# Patient Record
Sex: Female | Born: 1940 | Race: White | Hispanic: No | Smoking: Current every day smoker
Health system: Southern US, Community
[De-identification: ages and names within clinical notes are randomized; demographics above are authoritative.]

## PROBLEM LIST (undated history)

## (undated) DIAGNOSIS — R87619 Unspecified abnormal cytological findings in specimens from cervix uteri: Secondary | ICD-10-CM

## (undated) DIAGNOSIS — E785 Hyperlipidemia, unspecified: Secondary | ICD-10-CM

## (undated) DIAGNOSIS — Z1239 Encounter for other screening for malignant neoplasm of breast: Secondary | ICD-10-CM

## (undated) DIAGNOSIS — M7150 Other bursitis, not elsewhere classified, unspecified site: Secondary | ICD-10-CM

## (undated) DIAGNOSIS — E079 Disorder of thyroid, unspecified: Secondary | ICD-10-CM

## (undated) DIAGNOSIS — M8580 Other specified disorders of bone density and structure, unspecified site: Secondary | ICD-10-CM

## (undated) DIAGNOSIS — C801 Malignant (primary) neoplasm, unspecified: Secondary | ICD-10-CM

## (undated) DIAGNOSIS — Z720 Tobacco use: Secondary | ICD-10-CM

## (undated) DIAGNOSIS — J449 Chronic obstructive pulmonary disease, unspecified: Secondary | ICD-10-CM

## (undated) DIAGNOSIS — Z8679 Personal history of other diseases of the circulatory system: Secondary | ICD-10-CM

## (undated) HISTORY — PX: CHOLECYSTECTOMY: SHX55

## (undated) HISTORY — DX: Disorder of thyroid, unspecified: E07.9

## (undated) HISTORY — DX: Hyperlipidemia, unspecified: E78.5

## (undated) HISTORY — DX: Other specified disorders of bone density and structure, unspecified site: M85.80

## (undated) HISTORY — DX: Other bursitis, not elsewhere classified, unspecified site: M71.50

## (undated) HISTORY — DX: Encounter for other screening for malignant neoplasm of breast: Z12.39

## (undated) HISTORY — DX: Tobacco use: Z72.0

## (undated) HISTORY — DX: Personal history of other diseases of the circulatory system: Z86.79

## (undated) HISTORY — PX: BREAST BIOPSY: SHX20

## (undated) HISTORY — PX: TUBAL LIGATION: SHX77

## (undated) HISTORY — DX: Chronic obstructive pulmonary disease, unspecified: J44.9

## (undated) HISTORY — DX: Unspecified abnormal cytological findings in specimens from cervix uteri: R87.619

---

## 1999-10-14 DIAGNOSIS — Z8679 Personal history of other diseases of the circulatory system: Secondary | ICD-10-CM

## 1999-10-14 DIAGNOSIS — R87619 Unspecified abnormal cytological findings in specimens from cervix uteri: Secondary | ICD-10-CM

## 1999-10-14 HISTORY — DX: Personal history of other diseases of the circulatory system: Z86.79

## 1999-10-14 HISTORY — DX: Unspecified abnormal cytological findings in specimens from cervix uteri: R87.619

## 2004-10-10 ENCOUNTER — Ambulatory Visit: Payer: Self-pay | Admitting: Gastroenterology

## 2005-07-31 ENCOUNTER — Ambulatory Visit: Payer: Self-pay | Admitting: Family Medicine

## 2005-08-12 ENCOUNTER — Ambulatory Visit: Payer: Self-pay | Admitting: Family Medicine

## 2005-09-01 ENCOUNTER — Ambulatory Visit: Payer: Self-pay | Admitting: Surgery

## 2006-10-14 ENCOUNTER — Ambulatory Visit: Payer: Self-pay | Admitting: Family Medicine

## 2007-10-12 ENCOUNTER — Ambulatory Visit: Payer: Self-pay | Admitting: Gastroenterology

## 2007-10-14 DIAGNOSIS — M8580 Other specified disorders of bone density and structure, unspecified site: Secondary | ICD-10-CM

## 2007-10-14 HISTORY — DX: Other specified disorders of bone density and structure, unspecified site: M85.80

## 2007-11-09 ENCOUNTER — Ambulatory Visit: Payer: Self-pay | Admitting: Internal Medicine

## 2007-11-15 ENCOUNTER — Ambulatory Visit: Payer: Self-pay | Admitting: Internal Medicine

## 2008-01-10 ENCOUNTER — Ambulatory Visit: Payer: Self-pay | Admitting: Internal Medicine

## 2008-01-13 ENCOUNTER — Ambulatory Visit: Payer: Self-pay | Admitting: Internal Medicine

## 2008-02-29 ENCOUNTER — Ambulatory Visit: Payer: Self-pay | Admitting: Internal Medicine

## 2008-02-29 ENCOUNTER — Other Ambulatory Visit: Payer: Self-pay

## 2008-12-06 ENCOUNTER — Ambulatory Visit: Payer: Self-pay | Admitting: Internal Medicine

## 2009-04-02 ENCOUNTER — Ambulatory Visit: Payer: Self-pay | Admitting: Surgery

## 2009-04-09 ENCOUNTER — Ambulatory Visit: Payer: Self-pay | Admitting: Surgery

## 2009-12-11 ENCOUNTER — Ambulatory Visit: Payer: Self-pay | Admitting: Internal Medicine

## 2010-03-13 ENCOUNTER — Ambulatory Visit: Payer: Self-pay | Admitting: Surgery

## 2010-03-13 HISTORY — PX: HERNIA REPAIR: SHX51

## 2010-03-21 ENCOUNTER — Ambulatory Visit: Payer: Self-pay | Admitting: Surgery

## 2010-05-21 LAB — HM COLONOSCOPY

## 2010-06-13 HISTORY — PX: CATARACT EXTRACTION: SUR2

## 2010-06-21 ENCOUNTER — Ambulatory Visit: Payer: Self-pay | Admitting: Ophthalmology

## 2010-07-02 ENCOUNTER — Ambulatory Visit: Payer: Self-pay | Admitting: Ophthalmology

## 2011-02-11 DIAGNOSIS — Z1239 Encounter for other screening for malignant neoplasm of breast: Secondary | ICD-10-CM | POA: Insufficient documentation

## 2011-02-11 HISTORY — DX: Encounter for other screening for malignant neoplasm of breast: Z12.39

## 2011-02-19 ENCOUNTER — Ambulatory Visit: Payer: Self-pay | Admitting: Internal Medicine

## 2011-07-04 ENCOUNTER — Ambulatory Visit (INDEPENDENT_AMBULATORY_CARE_PROVIDER_SITE_OTHER): Payer: BC Managed Care – PPO | Admitting: Internal Medicine

## 2011-07-04 ENCOUNTER — Encounter: Payer: Self-pay | Admitting: Internal Medicine

## 2011-07-04 VITALS — BP 170/91 | HR 69 | Temp 98.1°F | Resp 14 | Ht 65.0 in | Wt 179.5 lb

## 2011-07-04 DIAGNOSIS — I83812 Varicose veins of left lower extremities with pain: Secondary | ICD-10-CM

## 2011-07-04 DIAGNOSIS — IMO0001 Reserved for inherently not codable concepts without codable children: Secondary | ICD-10-CM

## 2011-07-04 DIAGNOSIS — J449 Chronic obstructive pulmonary disease, unspecified: Secondary | ICD-10-CM

## 2011-07-04 DIAGNOSIS — E785 Hyperlipidemia, unspecified: Secondary | ICD-10-CM

## 2011-07-04 DIAGNOSIS — I499 Cardiac arrhythmia, unspecified: Secondary | ICD-10-CM

## 2011-07-04 DIAGNOSIS — E039 Hypothyroidism, unspecified: Secondary | ICD-10-CM

## 2011-07-04 DIAGNOSIS — R300 Dysuria: Secondary | ICD-10-CM

## 2011-07-04 DIAGNOSIS — I83893 Varicose veins of bilateral lower extremities with other complications: Secondary | ICD-10-CM

## 2011-07-04 DIAGNOSIS — I10 Essential (primary) hypertension: Secondary | ICD-10-CM

## 2011-07-04 DIAGNOSIS — I8390 Asymptomatic varicose veins of unspecified lower extremity: Secondary | ICD-10-CM

## 2011-07-04 DIAGNOSIS — M791 Myalgia, unspecified site: Secondary | ICD-10-CM

## 2011-07-04 LAB — BASIC METABOLIC PANEL
CO2: 27 mEq/L (ref 19–32)
Calcium: 9 mg/dL (ref 8.4–10.5)
Chloride: 102 mEq/L (ref 96–112)
Glucose, Bld: 96 mg/dL (ref 70–99)
Sodium: 137 mEq/L (ref 135–145)

## 2011-07-04 LAB — HEPATIC FUNCTION PANEL
ALT: 17 U/L (ref 0–35)
AST: 20 U/L (ref 0–37)
Albumin: 4.1 g/dL (ref 3.5–5.2)
Alkaline Phosphatase: 68 U/L (ref 39–117)
Total Protein: 7.2 g/dL (ref 6.0–8.3)

## 2011-07-04 LAB — MAGNESIUM: Magnesium: 1.9 mg/dL (ref 1.5–2.5)

## 2011-07-04 LAB — POCT URINALYSIS DIPSTICK
Bilirubin, UA: NEGATIVE
Glucose, UA: NEGATIVE
Ketones, UA: NEGATIVE
Nitrite, UA: NEGATIVE
pH, UA: 6.5

## 2011-07-04 LAB — LIPID PANEL
Total CHOL/HDL Ratio: 3
Triglycerides: 103 mg/dL (ref 0.0–149.0)

## 2011-07-04 MED ORDER — SIMVASTATIN 20 MG PO TABS: 20.0000 mg | ORAL_TABLET | Freq: Every day | ORAL | Status: DC

## 2011-07-04 MED ORDER — LEVOTHYROXINE SODIUM 100 MCG PO TABS: 100.0000 ug | ORAL_TABLET | Freq: Every day | ORAL | Status: DC

## 2011-07-04 MED ORDER — FLUTICASONE-SALMETEROL 250-50 MCG/DOSE IN AEPB: 1.0000 | INHALATION_SPRAY | Freq: Two times a day (BID) | RESPIRATORY_TRACT | Status: DC

## 2011-07-04 NOTE — Patient Instructions (Addendum)
I recommend trying Fibercon or Metamucil capsules 30 minutes before dinner daily to 1) help with constipation and  2) curb appetite.   Please check your blood pressure 5 times over the next 2 weeks and report the readings to me .  If they are mostly > 130/80. I will recommend starting a medication for blood pressure  Return for fasting blood work  In the next week or so.   We will check your urine today for infection

## 2011-07-04 NOTE — Progress Notes (Signed)
  Subjective:    Patient ID: Jane Davidson, female    DOB: [birth year], 70 y.o.   MRN: [id number]  HPI 70 yo with hypothyroid disease, hypertension and COPD presents for 6 months f/u.  Feels tired all the time,  Not sleeping particularly well.  Falling asleep during the morning and afternoon, despite being in bed for a minimum of 8 hours,  Not exercising daily. Weight stable. Constipated without a daily stool softener.     Review of Systems  Constitutional: Positive for fatigue. Negative for fever, chills and unexpected weight change.  HENT: Negative for hearing loss, ear pain, nosebleeds, congestion, sore throat, facial swelling, rhinorrhea, sneezing, mouth sores, trouble swallowing, neck pain, neck stiffness, voice change, postnasal drip, sinus pressure, tinnitus and ear discharge.   Eyes: Negative for pain, discharge, redness and visual disturbance.  Respiratory: Negative for cough, chest tightness, shortness of breath, wheezing and stridor.   Cardiovascular: Positive for palpitations. Negative for chest pain and leg swelling.  Musculoskeletal: Negative for myalgias and arthralgias.  Skin: Negative for color change and rash.  Neurological: Negative for dizziness, tremors, syncope, weakness, light-headedness and headaches.  Hematological: Negative for adenopathy.  Psychiatric/Behavioral: Positive for sleep disturbance.       Objective:   Physical Exam  Constitutional: She is oriented to person, place, and time. She appears well-developed and well-nourished.  HENT:  Mouth/Throat: Oropharynx is clear and moist.  Eyes: EOM are normal. Pupils are equal, round, and reactive to light. No scleral icterus.  Neck: Normal range of motion. Neck supple. No JVD present. No thyromegaly present.  Cardiovascular: Normal rate, normal heart sounds and intact distal pulses.        Irregular heart beat noted  Pulmonary/Chest: Effort normal and breath sounds normal.  Abdominal: Soft. Bowel sounds are  normal. She exhibits no mass. There is no tenderness.  Musculoskeletal: Normal range of motion. She exhibits no edema.  Lymphadenopathy:    She has no cervical adenopathy.  Neurological: She is alert and oriented to person, place, and time. She displays normal reflexes. No cranial nerve deficit. She exhibits normal muscle tone. Coordination normal.  Skin: Skin is warm and dry.  Psychiatric: She has a normal mood and affect.          Assessment & Plan:

## 2011-07-05 ENCOUNTER — Encounter: Payer: Self-pay | Admitting: Internal Medicine

## 2011-07-05 DIAGNOSIS — I499 Cardiac arrhythmia, unspecified: Secondary | ICD-10-CM | POA: Insufficient documentation

## 2011-07-05 DIAGNOSIS — E039 Hypothyroidism, unspecified: Secondary | ICD-10-CM | POA: Insufficient documentation

## 2011-07-05 DIAGNOSIS — E785 Hyperlipidemia, unspecified: Secondary | ICD-10-CM | POA: Insufficient documentation

## 2011-07-05 DIAGNOSIS — I83812 Varicose veins of left lower extremities with pain: Secondary | ICD-10-CM | POA: Insufficient documentation

## 2011-07-05 DIAGNOSIS — J449 Chronic obstructive pulmonary disease, unspecified: Secondary | ICD-10-CM | POA: Insufficient documentation

## 2011-07-05 MED ORDER — LEVOTHYROXINE SODIUM 88 MCG PO TABS: 88.0000 ug | ORAL_TABLET | Freq: Every day | ORAL | Status: DC

## 2011-07-05 NOTE — Assessment & Plan Note (Signed)
New onset,  EKG today was normal except for mild bradycardia.  Will check electrolytes and thyroid function and if all normal  No fuether workup unless she becomes symptomatic.

## 2011-07-05 NOTE — Assessment & Plan Note (Signed)
Well controlled on current statin dose,  No changes today

## 2011-07-05 NOTE — Assessment & Plan Note (Signed)
Currently overactive on current thyroid dose, which may be contributing current irregular heart beat .  Will reasses after adjusting synthroid dose.,

## 2011-07-05 NOTE — Assessment & Plan Note (Signed)
Secondary to tobacco abuse.  May be contributing to her fatigue.  She has no PFTS on records.  Will refer to [facility] for testing.

## 2011-07-05 NOTE — Assessment & Plan Note (Signed)
With prior intervention by local vascular surgeon,  Records not available.  She is having pain without inflammation in the left inner thigh .  Refer to Vascular Surgery for evaluation .

## 2011-07-06 LAB — URINE CULTURE: Colony Count: 8000

## 2011-07-07 ENCOUNTER — Other Ambulatory Visit: Payer: Self-pay | Admitting: Internal Medicine

## 2011-07-07 MED ORDER — FLUTICASONE-SALMETEROL 100-50 MCG/DOSE IN AEPB: 1.0000 | INHALATION_SPRAY | Freq: Two times a day (BID) | RESPIRATORY_TRACT | Status: DC

## 2011-07-21 ENCOUNTER — Telehealth: Payer: Self-pay | Admitting: Internal Medicine

## 2011-07-21 NOTE — Telephone Encounter (Signed)
error 

## 2011-07-31 ENCOUNTER — Telehealth: Payer: Self-pay | Admitting: Internal Medicine

## 2011-07-31 NOTE — Telephone Encounter (Signed)
Patient has developed a runny nose & a deep cough since receiving the flu shot, she is requesting an antibiotic to be called into [facility]

## 2011-07-31 NOTE — Telephone Encounter (Signed)
In the absence of fevers and productive cough, there is no indication for antibiotics.  If she has had symptoms for more than one week, needs to be seen.

## 2011-07-31 NOTE — Telephone Encounter (Signed)
Patient made an appt for 08/06/11, she states the cough started when she got her flu shot, she does not have a fever

## 2011-08-06 ENCOUNTER — Encounter: Payer: Self-pay | Admitting: Internal Medicine

## 2011-08-06 ENCOUNTER — Ambulatory Visit (INDEPENDENT_AMBULATORY_CARE_PROVIDER_SITE_OTHER): Payer: BC Managed Care – PPO | Admitting: Internal Medicine

## 2011-08-06 VITALS — BP 156/86 | HR 98 | Temp 98.3°F | Resp 16 | Ht 65.0 in | Wt 181.5 lb

## 2011-08-06 DIAGNOSIS — Z7189 Other specified counseling: Secondary | ICD-10-CM

## 2011-08-06 DIAGNOSIS — K5900 Constipation, unspecified: Secondary | ICD-10-CM

## 2011-08-06 DIAGNOSIS — R300 Dysuria: Secondary | ICD-10-CM

## 2011-08-06 DIAGNOSIS — J40 Bronchitis, not specified as acute or chronic: Secondary | ICD-10-CM

## 2011-08-06 DIAGNOSIS — Z716 Tobacco abuse counseling: Secondary | ICD-10-CM

## 2011-08-06 LAB — POCT URINALYSIS DIPSTICK
Nitrite, UA: NEGATIVE
Protein, UA: NEGATIVE
Urobilinogen, UA: 0.2
pH, UA: 6

## 2011-08-06 MED ORDER — LEVOFLOXACIN 750 MG PO TABS: 750.0000 mg | ORAL_TABLET | Freq: Every day | ORAL | Status: AC

## 2011-08-06 MED ORDER — LACTULOSE 20 GM/30ML PO SOLN: 30.0000 mL | Freq: Four times a day (QID) | ORAL | Status: DC | PRN

## 2011-08-06 NOTE — Patient Instructions (Signed)
Return  in January for a full physical  Use the lactulsoe not more than once aweek to relieve constipation.  Work in about 4-6 hours  Drink one more glass of water daily as a rule

## 2011-08-06 NOTE — Progress Notes (Signed)
Subjective:    Patient ID: Jane Davidson, female    DOB: 1941-02-01, 70 y.o.   MRN: [id number]  HPIMs. Demarais is a 70 yo woman with a hsitory of COPD secondary to ongoing tobacco abusewho presents with a 2 weeks history of cough which began after she received her annual influenza vaccine.  The cough is persistent and productive of yellow sputum. ,  She has noted wheezing at home with supine position but not with activity and she denies chest pain, fevers, and shortness of breath. No recent travel or sick contacts.  Some sinus draingage and pharyngitis . Her second complaint is dysuria which began about 24 hours ago. She denies blood in her urine, back pain,  And nausea.  Past Medical History  Diagnosis Date  . Hyperlipidemia   . Bursitis NEC   . Osteopenia 2009     T scores - 1.5 DEXA 2009  . Tobacco abuse disorder   . COPD (chronic obstructive pulmonary disease)   . hypothyroid   . Vitamin D deficiency April 2011    replaced withDrsido for level of 11.3 ng/ml  . Vitamin D deficiency April 2011    replaced withDrsido for level of 11.3 ng/ml  . Screening for breast cancer May 2012    [facility] mammogram  . Abnormal Pap smear of cervix 2001    Biopsy normal  . History of peripheral vascular disease 2001    s/p bilateral aorto-iliac-femoral bypass /[facility]ke    Current Outpatient Prescriptions on File Prior to Visit  Medication Sig Dispense Refill  . Biotin 10 MG TABS Take by mouth.        . cyanocobalamin 100 MCG tablet Take 100 mcg by mouth daily.        . Fluticasone-Salmeterol (ADVAIR DISKUS) 100-50 MCG/DOSE AEPB Inhale 1 puff into the lungs 2 (two) times daily.  60 each  2  . levothyroxine (SYNTHROID, LEVOTHROID) 88 MCG tablet Take 1 tablet (88 mcg total) by mouth daily.  90 tablet  3  . sertraline (ZOLOFT) 50 MG tablet Take 50 mg by mouth daily.        . simvastatin (ZOCOR) 20 MG tablet Take 1 tablet (20 mg total) by mouth at bedtime.  90 tablet  3    Review of Systems    Constitutional: Positive for fatigue. Negative for fever, chills, diaphoresis and unexpected weight change.  HENT: Positive for sore throat and sinus pressure. Negative for hearing loss, ear pain, nosebleeds, congestion, facial swelling, rhinorrhea, sneezing, mouth sores, trouble swallowing, neck pain, neck stiffness, voice change, postnasal drip, tinnitus and ear discharge.   Eyes: Negative for pain, discharge, redness and visual disturbance.  Respiratory: Positive for cough and wheezing. Negative for chest tightness, shortness of breath and stridor.   Cardiovascular: Negative for chest pain, palpitations and leg swelling.  Genitourinary: Positive for dysuria.  Musculoskeletal: Negative for myalgias and arthralgias.  Skin: Negative for color change and rash.  Neurological: Negative for dizziness, weakness, light-headedness and headaches.  Hematological: Negative for adenopathy.   BP 156/86  Pulse 98  Temp(Src) 98.3 F (36.8 C) (Oral)  Resp 16  Ht 5\' 5"  (1.651 m)  Wt 181 lb 8 oz (82.328 kg)  BMI 30.20 kg/m2  SpO2 98%    Objective:   Physical Exam  HENT:  Right Ear: Tympanic membrane is bulging.  Left Ear: Tympanic membrane is bulging.  Mouth/Throat: Uvula is midline and mucous membranes are normal. Posterior oropharyngeal erythema present. No posterior oropharyngeal edema.  Cardiovascular: Normal rate, regular  rhythm and normal heart sounds.   Pulmonary/Chest: Effort normal and breath sounds normal. No accessory muscle usage. Not tachypneic.  Abdominal: Soft. Normal appearance and bowel sounds are normal.       Assessment & Plan:  Upper respitatory infection:  Symptoms have been present for two weeks,since her influenza vaccine was given/  Since she has bilateral bulging TMs and history of COPD .  Will treat with decongestants and empiric antiotics

## 2011-08-07 ENCOUNTER — Encounter: Payer: Self-pay | Admitting: Internal Medicine

## 2011-08-07 DIAGNOSIS — M8580 Other specified disorders of bone density and structure, unspecified site: Secondary | ICD-10-CM | POA: Insufficient documentation

## 2011-08-07 DIAGNOSIS — M810 Age-related osteoporosis without current pathological fracture: Secondary | ICD-10-CM | POA: Insufficient documentation

## 2011-08-07 DIAGNOSIS — E559 Vitamin D deficiency, unspecified: Secondary | ICD-10-CM | POA: Insufficient documentation

## 2011-08-07 DIAGNOSIS — M7070 Other bursitis of hip, unspecified hip: Secondary | ICD-10-CM | POA: Insufficient documentation

## 2011-08-07 DIAGNOSIS — E079 Disorder of thyroid, unspecified: Secondary | ICD-10-CM | POA: Insufficient documentation

## 2011-08-07 DIAGNOSIS — Z720 Tobacco use: Secondary | ICD-10-CM | POA: Insufficient documentation

## 2011-08-09 ENCOUNTER — Encounter: Payer: Self-pay | Admitting: Internal Medicine

## 2011-08-09 DIAGNOSIS — Z8679 Personal history of other diseases of the circulatory system: Secondary | ICD-10-CM | POA: Insufficient documentation

## 2011-08-09 DIAGNOSIS — Z716 Tobacco abuse counseling: Secondary | ICD-10-CM | POA: Insufficient documentation

## 2011-08-09 DIAGNOSIS — R87619 Unspecified abnormal cytological findings in specimens from cervix uteri: Secondary | ICD-10-CM | POA: Insufficient documentation

## 2011-08-09 NOTE — Assessment & Plan Note (Signed)
She has not smoked in several days due to current URI.  I again counseled her on the risks of continued tobacco abuse and encouraged her to continue abstinence after she has recovered.

## 2011-09-29 ENCOUNTER — Ambulatory Visit (INDEPENDENT_AMBULATORY_CARE_PROVIDER_SITE_OTHER): Payer: Self-pay | Admitting: Internal Medicine

## 2011-09-29 ENCOUNTER — Encounter: Payer: Self-pay | Admitting: Internal Medicine

## 2011-09-29 DIAGNOSIS — J069 Acute upper respiratory infection, unspecified: Secondary | ICD-10-CM | POA: Insufficient documentation

## 2011-09-29 MED ORDER — AMOXICILLIN-POT CLAVULANATE 875-125 MG PO TABS: 1.0000 | ORAL_TABLET | Freq: Two times a day (BID) | ORAL | Status: AC

## 2011-09-29 MED ORDER — CHLORPHENIRAMINE-HYDROCODONE 8-10 MG/5ML PO LQCR: 5.0000 mL | Freq: Every evening | ORAL | Status: DC | PRN

## 2011-09-29 NOTE — Progress Notes (Signed)
Subjective:    Patient ID: Jane Davidson, female    DOB: 11/21/1940, 70 y.o.   MRN: [id number]  HPI  Jane Davidson is a 70 yo smoker who presents with cough for 4 days,  yellow sputum ,  whole family sick,  No myalgias or fevers,  But the cough is keeping her up at  Night ,  No dyspnea,  No diarrhea,  Headache,  Rhinitis,   Sneezing,  .  Using allegra and benadryl with some improvement in the runny nose.   Past Medical History  Diagnosis Date  . Hyperlipidemia   . Bursitis NEC   . Osteopenia 2009     T scores - 1.5 DEXA 2009  . Tobacco abuse disorder   . COPD (chronic obstructive pulmonary disease)   . hypothyroid   . Vitamin D deficiency April 2011    replaced withDrsido for level of 11.3 ng/ml  . Vitamin D deficiency April 2011    replaced withDrsido for level of 11.3 ng/ml  . Screening for breast cancer May 2012    [facility] mammogram  . Abnormal Pap smear of cervix 2001    Biopsy normal  . History of peripheral vascular disease 2001    s/p bilateral aorto-iliac-femoral bypass /[facility]ke   Current Outpatient Prescriptions on File Prior to Visit  Medication Sig Dispense Refill  . Biotin 10 MG TABS Take by mouth.        . cyanocobalamin 100 MCG tablet Take 100 mcg by mouth daily.        . Fluticasone-Salmeterol (ADVAIR DISKUS) 100-50 MCG/DOSE AEPB Inhale 1 puff into the lungs 2 (two) times daily.  60 each  2  . Lactulose 20 GM/30ML SOLN Take 30 mLs (20 g total) by mouth every 6 (six) hours as needed (to relive constipation ,  not more than once a week).  240 mL  1  . levothyroxine (SYNTHROID, LEVOTHROID) 88 MCG tablet Take 1 tablet (88 mcg total) by mouth daily.  90 tablet  3  . sertraline (ZOLOFT) 50 MG tablet Take 50 mg by mouth daily.        . simvastatin (ZOCOR) 20 MG tablet Take 1 tablet (20 mg total) by mouth at bedtime.  90 tablet  3   Review of Systems  Constitutional: Negative for fever, chills and unexpected weight change.  HENT: Negative for hearing loss, ear pain, nosebleeds,  congestion, sore throat, facial swelling, rhinorrhea, sneezing, mouth sores, trouble swallowing, neck pain, neck stiffness, voice change, postnasal drip, sinus pressure, tinnitus and ear discharge.   Eyes: Negative for pain, discharge, redness and visual disturbance.  Respiratory: Negative for cough, chest tightness, shortness of breath, wheezing and stridor.   Cardiovascular: Negative for chest pain, palpitations and leg swelling.  Musculoskeletal: Negative for myalgias and arthralgias.  Skin: Negative for color change and rash.  Neurological: Negative for dizziness, weakness, light-headedness and headaches.  Hematological: Negative for adenopathy.       Objective:   Physical Exam  Constitutional: She is oriented to person, place, and time. She appears well-developed and well-nourished.  HENT:  Mouth/Throat: Oropharynx is clear and moist.  Eyes: EOM are normal. Pupils are equal, round, and reactive to light. No scleral icterus.  Neck: Normal range of motion. Neck supple. No JVD present. No thyromegaly present.  Cardiovascular: Normal rate, regular rhythm, normal heart sounds and intact distal pulses.   Pulmonary/Chest: Effort normal and breath sounds normal.  Abdominal: Soft. Bowel sounds are normal. She exhibits no mass. There is no tenderness.  Musculoskeletal: Normal range of motion. She exhibits no edema.  Lymphadenopathy:    She has no cervical adenopathy.  Neurological: She is alert and oriented to person, place, and time.  Skin: Skin is warm and dry.  Psychiatric: She has a normal mood and affect.          Assessment & Plan:  Viral URI:  reassuance provided.  Abx rx given for progression of symptoms.

## 2011-09-29 NOTE — Patient Instructions (Signed)
Upper Respiratory Infection, Adult An upper respiratory infection (URI) is also sometimes known as the common cold. The upper respiratory tract includes the nose, sinuses, throat, trachea, and bronchi. Bronchi are the airways leading to the lungs. Most people improve within 1 week, but symptoms can last up to 2 weeks. A residual cough may last even longer.  CAUSES Many different viruses can infect the tissues lining the upper respiratory tract. The tissues become irritated and inflamed and often become very moist. Mucus production is also common. A cold is contagious. You can easily spread the virus to others by oral contact. This includes kissing, sharing a glass, coughing, or sneezing. Touching your mouth or nose and then touching a surface, which is then touched by another person, can also spread the virus. SYMPTOMS  Symptoms typically develop 1 to 3 days after you come in contact with a cold virus. Symptoms vary from person to person. They may include:  Runny nose.   Sneezing.   Nasal congestion.   Sinus irritation.   Sore throat.   Loss of voice (laryngitis).   Cough.   Fatigue.   Muscle aches.   Loss of appetite.   Headache.   Low-grade fever.  DIAGNOSIS  You might diagnose your own cold based on familiar symptoms, since most people get a cold 2 to 3 times a year. Your caregiver can confirm this based on your exam. Most importantly, your caregiver can check that your symptoms are not due to another disease such as strep throat, sinusitis, pneumonia, asthma, or epiglottitis. Blood tests, throat tests, and X-rays are not necessary to diagnose a common cold, but they may sometimes be helpful in excluding other more serious diseases. Your caregiver will decide if any further tests are required. RISKS AND COMPLICATIONS  You may be at risk for a more severe case of the common cold if you smoke cigarettes, have chronic heart disease (such as heart failure) or lung disease (such as  asthma), or if you have a weakened immune system. The very young and very old are also at risk for more serious infections. Bacterial sinusitis, middle ear infections, and bacterial pneumonia can complicate the common cold. The common cold can worsen asthma and chronic obstructive pulmonary disease (COPD). Sometimes, these complications can require emergency medical care and may be life-threatening. PREVENTION  The best way to protect against getting a cold is to practice good hygiene. Avoid oral or hand contact with people with cold symptoms. Wash your hands often if contact occurs. There is no clear evidence that vitamin C, vitamin E, echinacea, or exercise reduces the chance of developing a cold. However, it is always recommended to get plenty of rest and practice good nutrition. TREATMENT  Treatment is directed at relieving symptoms. There is no cure. Antibiotics are not effective, because the infection is caused by a virus, not by bacteria. Treatment may include:  Increased fluid intake. Sports drinks offer valuable electrolytes, sugars, and fluids.   Breathing heated mist or steam (vaporizer or shower).   Eating chicken soup or other clear broths, and maintaining good nutrition.   Getting plenty of rest.   Using gargles or lozenges for comfort.   Controlling fevers with ibuprofen or acetaminophen as directed by your caregiver.   Increasing usage of your inhaler if you have asthma.  Zinc gel and zinc lozenges, taken in the first 24 hours of the common cold, can shorten the duration and lessen the severity of symptoms. Pain medicines may help with fever, muscle   aches, and throat pain. A variety of non-prescription medicines are available to treat congestion and runny nose. Your caregiver can make recommendations and may suggest nasal or lung inhalers for other symptoms.  HOME CARE INSTRUCTIONS   Only take over-the-counter or prescription medicines for pain, discomfort, or fever as directed  by your caregiver.   Use a warm mist humidifier or inhale steam from a shower to increase air moisture. This may keep secretions moist and make it easier to breathe.   Drink enough water and fluids to keep your urine clear or pale yellow.   Rest as needed.   Return to work when your temperature has returned to normal or as your caregiver advises. You may need to stay home longer to avoid infecting others. You can also use a face mask and careful hand washing to prevent spread of the virus.  SEEK MEDICAL CARE IF:   After the first few days, you feel you are getting worse rather than better.   You need your caregiver's advice about medicines to control symptoms.   You develop chills, worsening shortness of breath, or brown or red sputum. These may be signs of pneumonia.   You develop yellow or brown nasal discharge or pain in the face, especially when you bend forward. These may be signs of sinusitis.   You develop a fever, swollen neck glands, pain with swallowing, or white areas in the back of your throat. These may be signs of strep throat.  SEEK IMMEDIATE MEDICAL CARE IF:   You have a fever.   You develop severe or persistent headache, ear pain, sinus pain, or chest pain.   You develop wheezing, a prolonged cough, cough up blood, or have a change in your usual mucus (if you have chronic lung disease).   You develop sore muscles or a stiff neck.  Document Released: 03/25/2001 Document Revised: 06/11/2011 Document Reviewed: 01/31/2011 [employer] Patient Information 2012 [place], [place].  If your symptoms worsen or you develop fever,  Brown sputum or worsening cough,  Add the antibiotic (Augmentin) .  Start the cough medicine now and continue to use allegra and benadryl.  Gargle with salt water,

## 2011-10-16 ENCOUNTER — Encounter: Payer: Self-pay | Admitting: Internal Medicine

## 2011-10-16 ENCOUNTER — Other Ambulatory Visit (HOSPITAL_COMMUNITY)
Admission: RE | Admit: 2011-10-16 | Discharge: 2011-10-16 | Disposition: A | Discharge status: Home / Self Care | Payer: BC Managed Care – PPO | Source: Ambulatory Visit | Attending: Internal Medicine | Admitting: Internal Medicine

## 2011-10-16 ENCOUNTER — Ambulatory Visit (INDEPENDENT_AMBULATORY_CARE_PROVIDER_SITE_OTHER): Payer: BC Managed Care – PPO | Admitting: Internal Medicine

## 2011-10-16 DIAGNOSIS — Z01419 Encounter for gynecological examination (general) (routine) without abnormal findings: Secondary | ICD-10-CM | POA: Insufficient documentation

## 2011-10-16 DIAGNOSIS — E611 Iron deficiency: Secondary | ICD-10-CM

## 2011-10-16 DIAGNOSIS — Z716 Tobacco abuse counseling: Secondary | ICD-10-CM

## 2011-10-16 DIAGNOSIS — Z124 Encounter for screening for malignant neoplasm of cervix: Secondary | ICD-10-CM

## 2011-10-16 DIAGNOSIS — R5381 Other malaise: Secondary | ICD-10-CM

## 2011-10-16 DIAGNOSIS — Z1211 Encounter for screening for malignant neoplasm of colon: Secondary | ICD-10-CM | POA: Insufficient documentation

## 2011-10-16 DIAGNOSIS — Z1159 Encounter for screening for other viral diseases: Secondary | ICD-10-CM | POA: Insufficient documentation

## 2011-10-16 DIAGNOSIS — J449 Chronic obstructive pulmonary disease, unspecified: Secondary | ICD-10-CM

## 2011-10-16 DIAGNOSIS — Z7189 Other specified counseling: Secondary | ICD-10-CM

## 2011-10-16 DIAGNOSIS — Z23 Encounter for immunization: Secondary | ICD-10-CM

## 2011-10-16 DIAGNOSIS — R5383 Other fatigue: Secondary | ICD-10-CM

## 2011-10-16 DIAGNOSIS — R87619 Unspecified abnormal cytological findings in specimens from cervix uteri: Secondary | ICD-10-CM

## 2011-10-16 DIAGNOSIS — E785 Hyperlipidemia, unspecified: Secondary | ICD-10-CM

## 2011-10-16 DIAGNOSIS — Z1239 Encounter for other screening for malignant neoplasm of breast: Secondary | ICD-10-CM

## 2011-10-16 DIAGNOSIS — Z79899 Other long term (current) drug therapy: Secondary | ICD-10-CM

## 2011-10-16 DIAGNOSIS — E039 Hypothyroidism, unspecified: Secondary | ICD-10-CM

## 2011-10-16 LAB — COMPREHENSIVE METABOLIC PANEL
Albumin: 3.7 g/dL (ref 3.5–5.2)
CO2: 23 mEq/L (ref 19–32)
Calcium: 8.7 mg/dL (ref 8.4–10.5)
Chloride: 110 mEq/L (ref 96–112)
GFR: 80.19 mL/min (ref >60.00)
Glucose, Bld: 101 mg/dL — ABNORMAL HIGH (ref 70–99)
Sodium: 139 mEq/L (ref 135–145)
Total Bilirubin: 0.4 mg/dL (ref 0.3–1.2)
Total Protein: 6.9 g/dL (ref 6.0–8.3)

## 2011-10-16 LAB — LIPID PANEL: HDL: 44.5 mg/dL (ref >39.00)

## 2011-10-16 LAB — IRON AND TIBC
TIBC: 353 ug/dL (ref 250–470)
UIBC: 250 ug/dL (ref 125–400)

## 2011-10-16 LAB — CBC WITH DIFFERENTIAL/PLATELET
Basophils Absolute: 0.1 10*3/uL (ref 0.0–0.1)
Eosinophils Absolute: 0.3 10*3/uL (ref 0.0–0.7)
Hemoglobin: 12.8 g/dL (ref 12.0–15.0)
Lymphocytes Relative: 16 % (ref 12.0–46.0)
Monocytes Relative: 7.4 % (ref 3.0–12.0)
Neutrophils Relative %: 72.7 % (ref 43.0–77.0)
Platelets: 303 10*3/uL (ref 150.0–400.0)
RDW: 13.6 % (ref 11.5–14.6)

## 2011-10-16 LAB — TSH: TSH: 8.01 u[IU]/mL — ABNORMAL HIGH (ref 0.35–5.50)

## 2011-10-16 LAB — FERRITIN: Ferritin: 41.9 ng/mL (ref 10.0–291.0)

## 2011-10-16 MED ORDER — LEVOTHYROXINE SODIUM 100 MCG PO TABS: 100.0000 ug | ORAL_TABLET | Freq: Every day | ORAL | Status: DC

## 2011-10-16 NOTE — Assessment & Plan Note (Signed)
Done in 2011 at [facility]. Per patiemt. polyps found.  Records needed    

## 2011-10-16 NOTE — Assessment & Plan Note (Signed)
Underactive on current dose,  TSH 8 .

## 2011-10-16 NOTE — Assessment & Plan Note (Signed)
Done in 2012, normal

## 2011-10-16 NOTE — Patient Instructions (Signed)
Your exam was normal today.  We will call you with the results of your PAP and your labs.   I would like for you to have pulmonary function tests in the spring to check your lung  function

## 2011-10-16 NOTE — Assessment & Plan Note (Signed)
She has a greater than 50  Pack year history of tobacco use and a history of COPD and PAD with prior aorto iliac femoral bypass.  She feels unable to quit but does not want pharmacotherapy

## 2011-10-16 NOTE — Assessment & Plan Note (Signed)
Managed with Advair.  I have recommended PFTS which she has deferred until Spring.

## 2011-10-16 NOTE — Assessment & Plan Note (Signed)
Chronicity unclear.  PAP was repeated today

## 2011-10-16 NOTE — Assessment & Plan Note (Signed)
LDL 74 by todays' labs  No changes to regimen

## 2011-10-16 NOTE — Progress Notes (Signed)
Subjective:     Jane Davidson is a 71 y.o. female and is here for a comprehensive physical exam. The patient reports no problems.  History   Social History  . Marital Status: Widowed    Spouse Name: N/A    Number of Children: N/A  . Years of Education: N/A   Occupational History  . Not on file.   Social History Main Topics  . Smoking status: Current Everyday Smoker -- 1.0 packs/day    Types: Cigarettes  . Smokeless tobacco: Never Used  . Alcohol Use: No  . Drug Use: No  . Sexually Active: Not on file   Other Topics Concern  . Not on file   Social History Narrative  . No narrative on file   Health Maintenance  Topic Date Due  . Tetanus/tdap  10/06/1961  . Mammogram  10/06/1992  . Colonoscopy  10/06/1992  . Zostavax  10/06/2002  . Pneumococcal Polysaccharide Vaccine Age 85 And Over  10/07/2007  . Influenza Vaccine  07/14/2011    The following portions of the patient's history were reviewed and updated as appropriate: allergies, current medications, past family history, past medical history, past social history, past surgical history and problem list.  Review of Systems A comprehensive review of systems was negative.   Objective:    BP 134/72  Pulse 84  Temp(Src) 98.4 F (36.9 C) (Oral)  Wt 175 lb (79.379 kg)BP 134/72  Pulse 84  Temp(Src) 98.4 F (36.9 C) (Oral)  Wt 175 lb (79.379 kg)  General Appearance:    Alert, cooperative, no distress, appears stated age  Head:    Normocephalic, without obvious abnormality, atraumatic  Eyes:    PERRL, conjunctiva/corneas clear, EOM's intact, fundi    benign, both eyes  Ears:    Normal TM's and external ear canals, both ears  Nose:   Nares normal, septum midline, mucosa normal, no drainage    or sinus tenderness  Throat:   Lips, mucosa, and tongue normal; teeth and gums normal  Neck:   Supple, symmetrical, trachea midline, no adenopathy;    thyroid:  no enlargement/tenderness/nodules; no carotid   bruit or JVD    Back:     Symmetric, no curvature, ROM normal, no CVA tenderness  Lungs:     Clear to auscultation bilaterally, respirations unlabored  Chest Wall:    No tenderness or deformity   Heart:    Regular rate and rhythm, S1 and S2 normal, no murmur, rub   or gallop  Breast Exam:    No tenderness, masses, or nipple abnormality  Abdomen:     Soft, non-tender, bowel sounds active all four quadrants,    no masses, no organomegaly  Genitalia:    Normal female without lesion, discharge or tenderness  Rectal:    Normal tone, normal prostate, no masses or tenderness;   guaiac negative stool  Extremities:   Extremities normal, atraumatic, no cyanosis or edema  Pulses:   2+ and symmetric all extremities  Skin:   Skin color, texture, turgor normal, no rashes or lesions  Lymph nodes:   Cervical, supraclavicular, and axillary nodes normal  Neurologic:   CNII-XII intact, normal strength, sensation and reflexes    throughout     Assessment:    Healthy female exam. Screening for breast cancer Done in 2012, normal  Hypothyroidism Underactive on current dose,  TSH 8 .   Abnormal Pap smear of cervix Chronicity unclear.  PAP was repeated today  Hyperlipidemia LDL 74 by todays' labs  No changes to regimen  Tobacco abuse counseling She has a greater than 50  Pack year history of tobacco use and a history of COPD and PAD with prior aorto iliac femoral bypass.  She feels unable to quit but does not want pharmacotherapy   COPD (chronic obstructive pulmonary disease) Managed with Advair.  I have recommended PFTS which she has deferred until Spring.   Screening for colon cancer Done in [facility] at [facility]. Per patiemt. polyps found.  Records needed     Updated Medication List Outpatient Encounter Prescriptions as of 10/16/2011  Medication Sig Dispense Refill  . Biotin 10 MG TABS Take by mouth.        . cyanocobalamin 100 MCG tablet Take 100 mcg by mouth daily.        . Fluticasone-Salmeterol (ADVAIR  DISKUS) 100-50 MCG/DOSE AEPB Inhale 1 puff into the lungs 2 (two) times daily.  60 each  2  . Lactulose 20 GM/30ML SOLN Take 30 mLs (20 g total) by mouth every 6 (six) hours as needed (to relive constipation ,  not more than once a week).  240 mL  1  . levothyroxine (SYNTHROID, LEVOTHROID) 100 MCG tablet Take 1 tablet (100 mcg total) by mouth daily.  90 tablet  3  . sertraline (ZOLOFT) 50 MG tablet Take 50 mg by mouth daily.        . simvastatin (ZOCOR) 20 MG tablet Take 1 tablet (20 mg total) by mouth at bedtime.  90 tablet  3  . DISCONTD: levothyroxine (SYNTHROID, LEVOTHROID) 88 MCG tablet Take 1 tablet (88 mcg total) by mouth daily.  90 tablet  3  . chlorpheniramine-hydrocodone (TUSSIONEX) 8-10 MG/5ML suspension Take 5 mLs by mouth at bedtime as needed and may repeat dose one time if needed for cough.  150 mL  0      Plan:     See After Visit Summary for Counseling Recommendations

## 2011-10-21 ENCOUNTER — Encounter: Payer: Self-pay | Admitting: *Deleted

## 2011-11-05 LAB — HM PAP SMEAR: HM Pap smear: NORMAL

## 2011-11-19 ENCOUNTER — Encounter: Payer: Self-pay | Admitting: Internal Medicine

## 2011-12-18 ENCOUNTER — Telehealth: Payer: Self-pay | Admitting: Internal Medicine

## 2011-12-18 NOTE — Telephone Encounter (Signed)
Left message notifying patient to call and schedule nurse visit for shingles vaccine.

## 2011-12-18 NOTE — Telephone Encounter (Signed)
Yes anytime

## 2011-12-18 NOTE — Telephone Encounter (Signed)
Is it okay for patient to come in for zostavax?

## 2011-12-18 NOTE — Telephone Encounter (Signed)
Patient called her insurance company to see if she would be covered for her shingle shot and [employer] will cover her shot . She wants to know when she can come in for her injection.

## 2011-12-19 ENCOUNTER — Ambulatory Visit (INDEPENDENT_AMBULATORY_CARE_PROVIDER_SITE_OTHER): Payer: BC Managed Care – PPO | Admitting: Internal Medicine

## 2011-12-19 DIAGNOSIS — Z23 Encounter for immunization: Secondary | ICD-10-CM

## 2011-12-19 MED ORDER — ZOSTER VACCINE LIVE 19400 UNT/0.65ML ~~LOC~~ SOLR
0.6500 mL | Freq: Once | SUBCUTANEOUS | Status: AC
  Administered 2011-12-19: 19400 [IU] via SUBCUTANEOUS

## 2011-12-19 NOTE — Telephone Encounter (Signed)
Patient came in today for her Shingles vaccine

## 2012-02-24 ENCOUNTER — Ambulatory Visit: Payer: Self-pay | Admitting: Internal Medicine

## 2012-03-04 LAB — HM MAMMOGRAPHY: HM Mammogram: NORMAL

## 2012-03-23 ENCOUNTER — Ambulatory Visit: Payer: Self-pay | Admitting: Ophthalmology

## 2012-03-23 ENCOUNTER — Encounter: Payer: Self-pay | Admitting: Internal Medicine

## 2012-04-06 ENCOUNTER — Ambulatory Visit: Payer: Self-pay | Admitting: Ophthalmology

## 2012-06-18 ENCOUNTER — Other Ambulatory Visit: Payer: Self-pay | Admitting: Internal Medicine

## 2012-07-05 ENCOUNTER — Ambulatory Visit (INDEPENDENT_AMBULATORY_CARE_PROVIDER_SITE_OTHER): Payer: BC Managed Care – PPO | Admitting: Internal Medicine

## 2012-07-05 ENCOUNTER — Encounter: Payer: Self-pay | Admitting: Internal Medicine

## 2012-07-05 VITALS — BP 144/78 | HR 88 | Temp 98.5°F | Ht 65.0 in | Wt 172.2 lb

## 2012-07-05 DIAGNOSIS — J449 Chronic obstructive pulmonary disease, unspecified: Secondary | ICD-10-CM

## 2012-07-05 DIAGNOSIS — Z23 Encounter for immunization: Secondary | ICD-10-CM

## 2012-07-05 DIAGNOSIS — E039 Hypothyroidism, unspecified: Secondary | ICD-10-CM

## 2012-07-05 DIAGNOSIS — Z79899 Other long term (current) drug therapy: Secondary | ICD-10-CM

## 2012-07-05 DIAGNOSIS — Z8679 Personal history of other diseases of the circulatory system: Secondary | ICD-10-CM

## 2012-07-05 DIAGNOSIS — M7070 Other bursitis of hip, unspecified hip: Secondary | ICD-10-CM

## 2012-07-05 DIAGNOSIS — M76899 Other specified enthesopathies of unspecified lower limb, excluding foot: Secondary | ICD-10-CM

## 2012-07-05 DIAGNOSIS — E785 Hyperlipidemia, unspecified: Secondary | ICD-10-CM

## 2012-07-05 MED ORDER — MELOXICAM 15 MG PO TABS: 15.0000 mg | ORAL_TABLET | Freq: Every day | ORAL | Status: DC

## 2012-07-05 NOTE — Patient Instructions (Addendum)
Am prescribing meloxicam for your buttock pain .  I tablet lasts all day ,  Take with food.   You can add tylenol  (acetominophen) with the meloxicam but not advil (ibuprofen) or alleve (naproxen) because they are in the same class and it can be too hard on stomach an d kidneys   Check your blood pressure once in a while at the drugstore.  Good bp is 140/80  Or less

## 2012-07-05 NOTE — Assessment & Plan Note (Signed)
With prior interventions.  Legs are well perfused currently and DPs are normal.   

## 2012-07-05 NOTE — Assessment & Plan Note (Addendum)
Well controlled on current medications.  CMET due. No changes today.

## 2012-07-05 NOTE — Assessment & Plan Note (Signed)
Reassurance provided that this is not due to peripheral vascular disease. She is excellent femoral and dorsalis pedis pulses the feet are well-perfused. Recommended that she try ibuprofen or Aleve as needed for relief of pain.

## 2012-07-05 NOTE — Progress Notes (Signed)
Patient ID: Jane Davidson, female   DOB: 1940-10-18, 71 y.o.   MRN: [id number]  Patient Active Problem List  Diagnosis  . Arrhythmia  . COPD (chronic obstructive pulmonary disease)  . Hyperlipidemia  . Hypothyroidism  . Varicose veins of left lower extremity with pain  . Bursitis, ischial  . Osteopenia  . Tobacco abuse disorder  . Vitamin d deficiency  . Screening for breast cancer  . History of peripheral vascular disease  . Tobacco abuse counseling  . Upper respiratory infection  . Screening for colon cancer    Subjective:  CC:   Chief Complaint  Patient presents with  . Follow-up    also SI joint pain    HPI:   Jane Davidson a 71 y.o. female who presentsfor Six-month followup on hypertension, hyperlipidemia, peripheral vascular disease. She feels generally well but has bilateral buttock pain brought on by prolonged sitting. She notices no leg cramps with walking. She remains physically active but is still smoking daily.  She is going to [employer] with her grandchildren  later on in the week.    Past Medical History  Diagnosis Date  . Hyperlipidemia   . Bursitis NEC   . Osteopenia 2009     T scores - 1.5 DEXA 2009  . Tobacco abuse disorder   . COPD (chronic obstructive pulmonary disease)   . hypothyroid   . Vitamin d deficiency April 2011    replaced withDrsido for level of 11.3 ng/ml  . Vitamin d deficiency April 2011    replaced withDrsido for level of 11.3 ng/ml  . Screening for breast cancer May 2012    [facility] mammogram  . Abnormal Pap smear of cervix 2001    Biopsy normal  . History of peripheral vascular disease 2001    s/p bilateral aorto-iliac-femoral bypass /[facility]ke    Past Surgical History  Procedure Date  . Hernia repair Jun 2011    Renda Rolls   . Cataract extraction Sept 2011  . Cholecystectomy   . Tubal ligation          The following portions of the patient's history were reviewed and updated as appropriate: Allergies, current  medications, and problem list.    Review of Systems:   12 Pt  review of systems was negative except those addressed in the HPI,     History   Social History  . Marital Status: Widowed    Spouse Name: N/A    Number of Children: N/A  . Years of Education: N/A   Occupational History  . Not on file.   Social History Main Topics  . Smoking status: Current Every Day Smoker -- 1.0 packs/day    Types: Cigarettes  . Smokeless tobacco: Never Used  . Alcohol Use: No  . Drug Use: No  . Sexually Active: Not on file   Other Topics Concern  . Not on file   Social History Narrative  . No narrative on file    Objective:  BP 144/78  Pulse 88  Temp 98.5 F (36.9 C) (Oral)  Ht 5\' 5"  (1.651 m)  Wt 172 lb 4 oz (78.132 kg)  BMI 28.66 kg/m2  General appearance: alert, cooperative and appears stated age Ears: normal TM's and external ear canals both ears Throat: lips, mucosa, and tongue normal; teeth and gums normal Neck: no adenopathy, no carotid bruit, supple, symmetrical, trachea midline and thyroid not enlarged, symmetric, no tenderness/mass/nodules Back: symmetric, no curvature. ROM normal. No CVA tenderness. Lungs: clear to auscultation bilaterally  Heart: regular rate and rhythm, S1, S2 normal, no murmur, click, rub or gallop Abdomen: soft, non-tender; bowel sounds normal; no masses,  no organomegaly Pulses: 2+ and symmetric Skin: Skin color, texture, turgor normal. No rashes or lesions Lymph nodes: Cervical, supraclavicular, and axillary nodes normal.  Assessment and Plan:  Bursitis, ischial Reassurance provided that this is not due to peripheral vascular disease. She is excellent femoral and dorsalis pedis pulses the feet are well-perfused. Recommended that she try ibuprofen or Aleve as needed for relief of pain.  Hyperlipidemia Well controlled on current medications.  CMET due. No changes today.  COPD (chronic obstructive pulmonary disease) She is currently  asymptomatic and using her inhalers as prescribed.    History of peripheral vascular disease With prior interventions.  Legs are well perfused currently and DPs are normal.   Updated Medication List Outpatient Encounter Prescriptions as of 07/05/2012  Medication Sig Dispense Refill  . ADVAIR DISKUS 100-50 MCG/DOSE AEPB ONE PUFF EVERY 12 HOURS                 AS DIRECTED. RINSE MOUTH          AFTER USING  60 each  5  . Biotin 10 MG TABS Take by mouth.        . cyanocobalamin 100 MCG tablet Take 100 mcg by mouth daily.        . Lactulose 20 GM/30ML SOLN Take 30 mLs (20 g total) by mouth every 6 (six) hours as needed (to relive constipation ,  not more than once a week).  240 mL  1  . levothyroxine (SYNTHROID, LEVOTHROID) 100 MCG tablet Take 1 tablet (100 mcg total) by mouth daily.  90 tablet  3  . sertraline (ZOLOFT) 50 MG tablet Take 50 mg by mouth daily.        . simvastatin (ZOCOR) 20 MG tablet Take 1 tablet (20 mg total) by mouth at bedtime.  90 tablet  3  . meloxicam (MOBIC) 15 MG tablet Take 1 tablet (15 mg total) by mouth daily.  30 tablet  2  . DISCONTD: chlorpheniramine-hydrocodone (TUSSIONEX) 8-10 MG/5ML suspension Take 5 mLs by mouth at bedtime as needed and may repeat dose one time if needed for cough.  150 mL  0     Orders Placed This Encounter  Procedures  . HM MAMMOGRAPHY  . Pneumococcal polysaccharide vaccine 23-valent greater than or equal to 2yo subcutaneous/IM  . Lipid panel  . Comprehensive metabolic panel  . TSH  . HM PAP SMEAR    No Follow-up on file.

## 2012-07-05 NOTE — Assessment & Plan Note (Signed)
She is currently asymptomatic and using her inhalers as prescribed.   

## 2012-07-06 LAB — TSH: TSH: 0.74 u[IU]/mL (ref 0.35–5.50)

## 2012-07-06 LAB — COMPREHENSIVE METABOLIC PANEL
AST: 19 U/L (ref 0–37)
Albumin: 3.7 g/dL (ref 3.5–5.2)
BUN: 13 mg/dL (ref 6–23)
CO2: 24 mEq/L (ref 19–32)
Calcium: 8.8 mg/dL (ref 8.4–10.5)
Chloride: 104 mEq/L (ref 96–112)
GFR: 81.26 mL/min (ref >60.00)
Potassium: 4.4 mEq/L (ref 3.5–5.1)

## 2012-07-06 LAB — LIPID PANEL
Cholesterol: 158 mg/dL (ref 0–200)
LDL Cholesterol: 105 mg/dL — ABNORMAL HIGH (ref 0–99)

## 2012-07-23 ENCOUNTER — Encounter: Payer: Self-pay | Admitting: Internal Medicine

## 2012-07-23 ENCOUNTER — Ambulatory Visit (INDEPENDENT_AMBULATORY_CARE_PROVIDER_SITE_OTHER): Payer: BC Managed Care – PPO | Admitting: Internal Medicine

## 2012-07-23 ENCOUNTER — Telehealth: Payer: Self-pay | Admitting: Internal Medicine

## 2012-07-23 VITALS — BP 120/62 | HR 89 | Temp 98.5°F | Ht 65.0 in | Wt 174.0 lb

## 2012-07-23 DIAGNOSIS — J40 Bronchitis, not specified as acute or chronic: Secondary | ICD-10-CM

## 2012-07-23 MED ORDER — ALBUTEROL SULFATE (2.5 MG/3ML) 0.083% IN NEBU
2.5000 mg | INHALATION_SOLUTION | Freq: Once | RESPIRATORY_TRACT | Status: AC
  Administered 2012-07-23: 2.5 mg via RESPIRATORY_TRACT

## 2012-07-23 MED ORDER — PREDNISONE (PAK) 10 MG PO TABS: ORAL_TABLET | ORAL | Status: DC

## 2012-07-23 MED ORDER — LEVOFLOXACIN 500 MG PO TABS: 500.0000 mg | ORAL_TABLET | Freq: Every day | ORAL | Status: DC

## 2012-07-23 NOTE — Assessment & Plan Note (Signed)
Symptoms and exam are consistent with bronchitis. Will start prednisone taper and Levaquin. Patient had significant improvement in air movement on exam after albuterol nebulizer. However, she declines prescription for inhaled albuterol at home. She will continue Advair twice daily. Encourage smoking cessation. She will followup in 2 weeks. She will call sooner if symptoms are not improving. If no improvement, would favor eval with CXR.

## 2012-07-23 NOTE — Telephone Encounter (Signed)
Caller: Jane Davidson/Patient; Patient Name: Jane Davidson; PCP: Duncan Dull (Adults only); Best Callback Phone Number: [phone]. Patient states onset of illness when she returned from [place], Onset Sunday 07/18/12.  +coughing and sneezing. Productive yellow . Afebrile. She has done home treatment with Cloraciden. Emergent s/sx ruled out per Cough-Adult with exception to  "productive cough with colored Sputum" See Provider in 24 hours. Appointment scheduled today 07/23/12 with Dr. Dan Humphreys at 15:00 pm. Home care advice and guidelines reveiwed. Understanding Expressed. Encouraged to call back for questions, changes or concerns

## 2012-07-23 NOTE — Progress Notes (Signed)
Subjective:    Patient ID: Jane Davidson, female    DOB: [birth year], 71 y.o.   MRN: [id number]  HPI 70 year old female with history of COPD, tobacco abuse presents for acute visit complaining of one-week history of cough productive of purulent sputum, shortness of breath, and chest tightness. She recently took a chip to [employer] and returned last Sunday. After returning home, she developed productive cough, subjective fever, and general malaise. Over the course of the week, symptoms have worsened with increasing shortness of breath. She has been using her Advair twice daily with no improvement.  Outpatient Encounter Prescriptions as of 07/23/2012  Medication Sig Dispense Refill  . ADVAIR DISKUS 100-50 MCG/DOSE AEPB ONE PUFF EVERY 12 HOURS                 AS DIRECTED. RINSE MOUTH          AFTER USING  60 each  5  . Biotin 10 MG TABS Take by mouth.        . cyanocobalamin 100 MCG tablet Take 100 mcg by mouth daily.        . Lactulose 20 GM/30ML SOLN Take 30 mLs (20 g total) by mouth every 6 (six) hours as needed (to relive constipation ,  not more than once a week).  240 mL  1  . levothyroxine (SYNTHROID, LEVOTHROID) 100 MCG tablet Take 1 tablet (100 mcg total) by mouth daily.  90 tablet  3  . meloxicam (MOBIC) 15 MG tablet Take 1 tablet (15 mg total) by mouth daily.  30 tablet  2  . sertraline (ZOLOFT) 50 MG tablet Take 50 mg by mouth daily.        . simvastatin (ZOCOR) 20 MG tablet Take 1 tablet (20 mg total) by mouth at bedtime.  90 tablet  3  . levofloxacin (LEVAQUIN) 500 MG tablet Take 1 tablet (500 mg total) by mouth daily.  7 tablet  0  . predniSONE (STERAPRED UNI-PAK) 10 MG tablet Take 60mg  day 1 then taper by 10mg  daily  21 tablet  0   Facility-Administered Encounter Medications as of 07/23/2012  Medication Dose Route Frequency Provider Last Rate Last Dose  . albuterol (PROVENTIL) (2.5 MG/3ML) 0.083% nebulizer solution 2.5 mg  2.5 mg Nebulization Once Shelia Media, MD   2.5 mg  at 07/23/12 1551   BP 120/62  Pulse 89  Temp 98.5 F (36.9 C) (Oral)  Ht 5\' 5"  (1.651 m)  Wt 174 lb (78.926 kg)  BMI 28.96 kg/m2  SpO2 94%  Review of Systems  Constitutional: Positive for fever and fatigue. Negative for chills and unexpected weight change.  HENT: Negative for hearing loss, ear pain, nosebleeds, congestion, sore throat, facial swelling, rhinorrhea, sneezing, mouth sores, trouble swallowing, neck pain, neck stiffness, voice change, postnasal drip, sinus pressure, tinnitus and ear discharge.   Eyes: Negative for pain, discharge, redness and visual disturbance.  Respiratory: Positive for cough and shortness of breath. Negative for chest tightness, wheezing and stridor.   Cardiovascular: Negative for chest pain, palpitations and leg swelling.  Musculoskeletal: Negative for myalgias and arthralgias.  Skin: Negative for color change and rash.  Neurological: Negative for dizziness, weakness, light-headedness and headaches.  Hematological: Negative for adenopathy.       Objective:   Physical Exam  Constitutional: She is oriented to person, place, and time. She appears well-developed and well-nourished. No distress.  HENT:  Head: Normocephalic and atraumatic.  Right Ear: External ear normal.  Left Ear: External ear normal.  Nose: Nose normal.  Mouth/Throat: Oropharynx is clear and moist. No oropharyngeal exudate.  Eyes: Conjunctivae normal are normal. Pupils are equal, round, and reactive to light. Right eye exhibits no discharge. Left eye exhibits no discharge. No scleral icterus.  Neck: Normal range of motion. Neck supple. No tracheal deviation present. No thyromegaly present.  Cardiovascular: Normal rate, regular rhythm, normal heart sounds and intact distal pulses.  Exam reveals no gallop and no friction rub.   No murmur heard. Pulmonary/Chest: Effort normal. No accessory muscle usage. Not tachypneic. No respiratory distress. She has decreased breath sounds (sig  improvement after albuterol neb). She has wheezes. She has rhonchi. She has no rales. She exhibits no tenderness.  Genitourinary: Rectum normal. Pelvic exam was performed with patient supine.  Musculoskeletal: Normal range of motion. She exhibits no edema and no tenderness.  Lymphadenopathy:    She has no cervical adenopathy.  Neurological: She is alert and oriented to person, place, and time. No cranial nerve deficit. She exhibits normal muscle tone. Coordination normal.  Skin: Skin is warm and dry. No rash noted. She is not diaphoretic. No erythema. No pallor.  Psychiatric: She has a normal mood and affect. Her behavior is normal. Judgment and thought content normal.          Assessment & Plan:

## 2012-09-13 ENCOUNTER — Other Ambulatory Visit: Payer: Self-pay | Admitting: Internal Medicine

## 2012-09-13 NOTE — Telephone Encounter (Signed)
Refilled Script for Zocor

## 2012-09-20 ENCOUNTER — Ambulatory Visit (INDEPENDENT_AMBULATORY_CARE_PROVIDER_SITE_OTHER)
Admission: RE | Admit: 2012-09-20 | Discharge: 2012-09-20 | Disposition: A | Discharge status: Home / Self Care | Payer: Medicare Other | Source: Ambulatory Visit | Attending: Internal Medicine | Admitting: Internal Medicine

## 2012-09-20 ENCOUNTER — Ambulatory Visit (INDEPENDENT_AMBULATORY_CARE_PROVIDER_SITE_OTHER): Payer: Medicare Other | Admitting: Internal Medicine

## 2012-09-20 ENCOUNTER — Encounter: Payer: Self-pay | Admitting: Internal Medicine

## 2012-09-20 VITALS — BP 132/64 | HR 77 | Temp 98.4°F | Resp 12 | Ht 65.0 in | Wt 175.0 lb

## 2012-09-20 DIAGNOSIS — M25559 Pain in unspecified hip: Secondary | ICD-10-CM

## 2012-09-20 DIAGNOSIS — M899 Disorder of bone, unspecified: Secondary | ICD-10-CM

## 2012-09-20 DIAGNOSIS — M7070 Other bursitis of hip, unspecified hip: Secondary | ICD-10-CM

## 2012-09-20 DIAGNOSIS — M76899 Other specified enthesopathies of unspecified lower limb, excluding foot: Secondary | ICD-10-CM

## 2012-09-20 DIAGNOSIS — M949 Disorder of cartilage, unspecified: Secondary | ICD-10-CM

## 2012-09-20 MED ORDER — ETODOLAC ER 600 MG PO TB24: 600.0000 mg | ORAL_TABLET | Freq: Every day | ORAL | Status: DC

## 2012-09-20 NOTE — Assessment & Plan Note (Signed)
Persistent.  Trial of etodolac XR and plain films of sacrum and coccyx ordered.  If normal will consider  oethopedic evaluation for steroid injection
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
## 2012-09-20 NOTE — Patient Instructions (Addendum)
I am sending you to the [facility] office of [place] for x rays of your pelvis and tailbone.  Please try taking the Etodolac once daily for bursitis.  If no improvement we will arrange orthopedic evaluation for an injection

## 2012-09-21 ENCOUNTER — Telehealth: Payer: Self-pay | Admitting: Internal Medicine

## 2012-09-21 NOTE — Telephone Encounter (Signed)
Patient wanting results from her x-rays.

## 2012-09-23 NOTE — Telephone Encounter (Signed)
Already documented that I spoke to patient and gave her results.

## 2012-09-27 ENCOUNTER — Telehealth: Payer: Self-pay | Admitting: Internal Medicine

## 2012-09-27 DIAGNOSIS — M76899 Other specified enthesopathies of unspecified lower limb, excluding foot: Secondary | ICD-10-CM

## 2012-09-27 NOTE — Telephone Encounter (Signed)
Left message on patient Vm letting her know that referral is in process for [facility].

## 2012-09-27 NOTE — Telephone Encounter (Signed)
Pt called to get a referral to an orthoapedic dr.  Pt would like to go to [place]

## 2012-09-27 NOTE — Telephone Encounter (Signed)
rerferral to [facility] in process Dr Shela Nevin

## 2012-10-04 ENCOUNTER — Other Ambulatory Visit: Payer: Self-pay | Admitting: Internal Medicine

## 2012-10-04 NOTE — Telephone Encounter (Signed)
Med filled.  

## 2012-10-30 ENCOUNTER — Ambulatory Visit: Payer: Self-pay | Admitting: Orthopedic Surgery

## 2012-12-02 ENCOUNTER — Ambulatory Visit: Payer: Self-pay | Admitting: Gastroenterology

## 2012-12-31 ENCOUNTER — Ambulatory Visit: Payer: Self-pay | Admitting: Internal Medicine

## 2012-12-31 ENCOUNTER — Ambulatory Visit (INDEPENDENT_AMBULATORY_CARE_PROVIDER_SITE_OTHER): Payer: Medicare Other | Admitting: Internal Medicine

## 2012-12-31 ENCOUNTER — Telehealth: Payer: Self-pay | Admitting: Internal Medicine

## 2012-12-31 ENCOUNTER — Encounter: Payer: Self-pay | Admitting: Internal Medicine

## 2012-12-31 VITALS — BP 120/70 | HR 76 | Temp 98.0°F | Resp 16 | Wt 170.5 lb

## 2012-12-31 DIAGNOSIS — R05 Cough: Secondary | ICD-10-CM

## 2012-12-31 DIAGNOSIS — R059 Cough, unspecified: Secondary | ICD-10-CM

## 2012-12-31 DIAGNOSIS — J441 Chronic obstructive pulmonary disease with (acute) exacerbation: Secondary | ICD-10-CM

## 2012-12-31 MED ORDER — METHYLPREDNISOLONE ACETATE 40 MG/ML IJ SUSP
40.0000 mg | Freq: Once | INTRAMUSCULAR | Status: AC
  Administered 2012-12-31: 40 mg via INTRAMUSCULAR

## 2012-12-31 MED ORDER — PREDNISONE (PAK) 10 MG PO TABS: ORAL_TABLET | ORAL | Status: DC

## 2012-12-31 MED ORDER — HYDROCODONE-HOMATROPINE 5-1.5 MG/5ML PO SYRP: 5.0000 mL | ORAL_SOLUTION | Freq: Three times a day (TID) | ORAL | Status: DC | PRN

## 2012-12-31 MED ORDER — LEVOFLOXACIN 500 MG PO TABS: 500.0000 mg | ORAL_TABLET | Freq: Every day | ORAL | Status: DC

## 2012-12-31 NOTE — Telephone Encounter (Signed)
Patient Information:  Caller Name: Talissa  Phone: [phone]  Patient: Jane Davidson, Patient  Gender: Female  DOB: 08/06/1941  Age: 72 Years  PCP: Duncan Dull (Adults only)  Office Follow Up:  Does the office need to follow up with this patient?: No  Instructions For The Office: N/A  RN Note:  Nasal drainage is green. Caller is able to eat, drink, interact, and void within normal limits. Cough is intermittent. Caller would like an antibiotic.  Symptoms  Reason For Call & Symptoms: Cough, runny nose.  Reviewed Health History In EMR: Yes  Reviewed Medications In EMR: Yes  Reviewed Allergies In EMR: Yes  Reviewed Surgeries / Procedures: Yes  Date of Onset of Symptoms: 12/28/2012  Treatments Tried: allergy medication, nasal saline spray  Treatments Tried Worked: Yes  Guideline(s) Used:  Colds  Disposition Per Guideline:   Home Care  Reason For Disposition Reached:   Colds with no complications  Advice Given:  Reassurance  It sounds like an uncomplicated cold that we can treat at home.  Colds are very common and may make you feel uncomfortable.  Colds are caused by viruses, and no medicine or "shot" will cure an uncomplicated cold.  Colds are usually not serious.  Here is some care advice that should help.  Humidifier:  If the air in your home is dry, use a cool-mist humidifier  Contagiousness:  The cold virus is present in your nasal secretions.  Cover your nose and mouth with a tissue when you sneeze or cough.  Wash your hands frequently with soap and water.  You can return to work or school after the fever is gone and you feel well enough to participate in normal activities.  Call Back If:  Difficulty breathing occurs  Fever lasts more than 3 days  Nasal discharge lasts more than 10 days  Cough lasts more than 3 weeks  You become worse  Patient Refused Recommendation:  Patient Requests Prescription  Caller would like an appointment- Scheduled with Dr Lorin Picket at  11:30AM on 12/31/12.

## 2012-12-31 NOTE — Patient Instructions (Addendum)
You have an early sinus infection   .  I am prescribing an antibiotic (levaquin) to take for severn days.  You received an injection of DepoMedrol today;  If it makes you feel better you can start the prednisone taper    I also advise use of the following OTC meds to help with your other symptoms.   Take generic OTC benadryl 25 mg every 8 hours for the drainage,  Sudafed PE  10 to 30 mg every 8 hours for the sinus congestion, you may substitute Afrin nasal spray for the nighttime dose of sudafed PE  If needed to prevent insomnia.  flushes your sinuses twice daily with Simply Saline (do over the sink because if you do it right you will spit out globs of mucus) Moisturize the inside of your nose with vaseline  Use OTC  Delsym   FOR THE COUGH. I have given you a prescription for a cough syrup with a mild narcotic if necessary

## 2012-12-31 NOTE — Telephone Encounter (Signed)
Just an FYI

## 2012-12-31 NOTE — Telephone Encounter (Signed)
error 

## 2013-01-02 ENCOUNTER — Encounter: Payer: Self-pay | Admitting: Internal Medicine

## 2013-01-02 DIAGNOSIS — J441 Chronic obstructive pulmonary disease with (acute) exacerbation: Secondary | ICD-10-CM | POA: Insufficient documentation

## 2013-01-02 NOTE — Progress Notes (Signed)
Patient ID: Jane Davidson, female   DOB: [birth year], 72 y.o.   MRN: [id number]  Patient Active Problem List  Diagnosis  . Arrhythmia  . COPD (chronic obstructive pulmonary disease)  . Hyperlipidemia  . Hypothyroidism  . Varicose veins of left lower extremity with pain  . Bursitis, ischial  . Osteopenia  . Tobacco abuse disorder  . Vitamin d deficiency  . Screening for breast cancer  . History of peripheral vascular disease  . Tobacco abuse counseling  . Screening for colon cancer  . COPD exacerbation    Subjective:  CC:   Chief Complaint  Patient presents with  . Cough    3 days, productive    HPI:   Jane Davidson a 72 y.o. female who presents  Past Medical History  Diagnosis Date  . Hyperlipidemia   . Bursitis NEC   . Osteopenia 2009     T scores - 1.5 DEXA 2009  . Tobacco abuse disorder   . COPD (chronic obstructive pulmonary disease)   . hypothyroid   . Vitamin D deficiency April 2011    replaced withDrsido for level of 11.3 ng/ml  . Vitamin D deficiency April 2011    replaced withDrsido for level of 11.3 ng/ml  . Screening for breast cancer May 2012    [facility]  . Abnormal Pap smear of cervix 2001    Biopsy normal  . History of peripheral vascular disease 2001    s/p bilateral aorto-iliac-femoral bypass /[facility]    Past Surgical History  Procedure Laterality Date  . Hernia repair  Jun 2011    Renda Rolls   . Cataract extraction  Sept 2011  . Cholecystectomy    . Tubal ligation         The following portions of the patient's history were reviewed and updated as appropriate: Allergies, current medications, and problem list.    Review of Systems:   12 Pt  review of systems was negative except those addressed in the HPI,     History   Social History  . Marital Status: Widowed    Spouse Name: N/A    Number of Children: N/A  . Years of Education: N/A   Occupational History  . Not on file.   Social History Main Topics  .  Smoking status: Current Every Day Smoker -- 1.00 packs/day    Types: Cigarettes  . Smokeless tobacco: Never Used  . Alcohol Use: No  . Drug Use: No  . Sexually Active: Not on file   Other Topics Concern  . Not on file   Social History Narrative  . No narrative on file    Objective:  BP 120/70  Pulse 76  Temp(Src) 98 F (36.7 C) (Oral)  Resp 16  Wt 170 lb 8 oz (77.338 kg)  BMI 28.37 kg/m2  SpO2 96%  General appearance: alert, cooperative and appears stated age Ears: normal TM's and external ear canals both ears Nose: no ulcerations,  Mucosa is normal appearing.   Throat: lips, mucosa, and tongue normal; teeth and gums normal Neck: no adenopathy, no carotid bruit, supple, symmetrical, trachea midline and thyroid not enlarged, symmetric, no tenderness/mass/nodules Back: symmetric, no curvature. ROM normal. No CVA tenderness. Lungs: clear to auscultation with occasional expiratory wheezes Heart: regular rate and rhythm, S1, S2 normal, no murmur, click, rub or gallop Abdomen: soft, non-tender; bowel sounds normal; no masses,  no organomegaly Pulses: 2+ and symmetric Skin: Skin color, texture, turgor normal. No rashes or lesions Lymph nodes: Cervical,  supraclavicular, and axillary nodes normal.  Assessment and Plan:  COPD exacerbation Brought on by viral sinus infection. She was given a dose of IM Solu-Medrol here in the office a prescription for Levaquin. She is instructed to start the prednisone if her wheezing returns. Supportive care for sinus infection described.   Updated Medication List Outpatient Encounter Prescriptions as of 12/31/2012  Medication Sig Dispense Refill  . ADVAIR DISKUS 100-50 MCG/DOSE AEPB ONE PUFF EVERY 12 HOURS                 AS DIRECTED. RINSE MOUTH          AFTER USING  60 each  5  . Biotin 10 MG TABS Take by mouth.        . cyanocobalamin 100 MCG tablet Take 100 mcg by mouth daily.        . Lactulose 20 GM/30ML SOLN Take 30 mLs (20 g total) by  mouth every 6 (six) hours as needed (to relive constipation ,  not more than once a week).  240 mL  1  . sertraline (ZOLOFT) 50 MG tablet Take 50 mg by mouth daily.        . simvastatin (ZOCOR) 20 MG tablet TAKE ONE TABLET AT BEDTIME                                                  Generic for ZOCOR 20  90 tablet  3  . SYNTHROID 100 MCG tablet TAKE ONE (1) TABLET EACH DAY  90 tablet  3  . [DISCONTINUED] levofloxacin (LEVAQUIN) 500 MG tablet Take 1 tablet (500 mg total) by mouth daily.  7 tablet  0  . etodolac (LODINE XL) 600 MG 24 hr tablet Take 1 tablet (600 mg total) by mouth daily.  30 tablet  3  . HYDROcodone-homatropine (HYCODAN) 5-1.5 MG/5ML syrup Take 5 mLs by mouth every 8 (eight) hours as needed for cough.  180 mL  0  . levofloxacin (LEVAQUIN) 500 MG tablet Take 1 tablet (500 mg total) by mouth daily.  7 tablet  0  . predniSONE (STERAPRED UNI-PAK) 10 MG tablet 6 tablets on Day 1 , then reduce by 1 tablet daily until gone  21 tablet  0  . [EXPIRED] methylPREDNISolone acetate (DEPO-MEDROL) injection 40 mg        No facility-administered encounter medications on file as of 12/31/2012.     No orders of the defined types were placed in this encounter.    No Follow-up on file.

## 2013-01-02 NOTE — Assessment & Plan Note (Addendum)
Brought on by viral sinus infection. She was given a dose of IM Solu-Medrol here in the office a prescription for Levaquin. She is instructed to start the prednisone if her wheezing returns. Supportive care for sinus infection described.

## 2013-03-25 ENCOUNTER — Encounter: Payer: Self-pay | Admitting: Internal Medicine

## 2013-03-25 ENCOUNTER — Ambulatory Visit (INDEPENDENT_AMBULATORY_CARE_PROVIDER_SITE_OTHER)
Admission: RE | Admit: 2013-03-25 | Discharge: 2013-03-25 | Disposition: A | Discharge status: Home / Self Care | Payer: Medicare Other | Source: Ambulatory Visit | Attending: Internal Medicine | Admitting: Internal Medicine

## 2013-03-25 ENCOUNTER — Ambulatory Visit (INDEPENDENT_AMBULATORY_CARE_PROVIDER_SITE_OTHER): Payer: Medicare Other | Admitting: Internal Medicine

## 2013-03-25 VITALS — BP 144/76 | HR 72 | Temp 98.3°F | Resp 16 | Ht 64.0 in | Wt 170.5 lb

## 2013-03-25 DIAGNOSIS — M25559 Pain in unspecified hip: Secondary | ICD-10-CM

## 2013-03-25 DIAGNOSIS — E785 Hyperlipidemia, unspecified: Secondary | ICD-10-CM

## 2013-03-25 DIAGNOSIS — M25552 Pain in left hip: Secondary | ICD-10-CM

## 2013-03-25 DIAGNOSIS — E039 Hypothyroidism, unspecified: Secondary | ICD-10-CM

## 2013-03-25 DIAGNOSIS — M79609 Pain in unspecified limb: Secondary | ICD-10-CM

## 2013-03-25 DIAGNOSIS — R05 Cough: Secondary | ICD-10-CM

## 2013-03-25 DIAGNOSIS — F17200 Nicotine dependence, unspecified, uncomplicated: Secondary | ICD-10-CM

## 2013-03-25 DIAGNOSIS — Z716 Tobacco abuse counseling: Secondary | ICD-10-CM

## 2013-03-25 DIAGNOSIS — E559 Vitamin D deficiency, unspecified: Secondary | ICD-10-CM

## 2013-03-25 DIAGNOSIS — Z8679 Personal history of other diseases of the circulatory system: Secondary | ICD-10-CM

## 2013-03-25 DIAGNOSIS — G8929 Other chronic pain: Secondary | ICD-10-CM

## 2013-03-25 DIAGNOSIS — J449 Chronic obstructive pulmonary disease, unspecified: Secondary | ICD-10-CM

## 2013-03-25 DIAGNOSIS — Z01419 Encounter for gynecological examination (general) (routine) without abnormal findings: Secondary | ICD-10-CM

## 2013-03-25 DIAGNOSIS — Z0000 Encounter for general adult medical examination without abnormal findings: Secondary | ICD-10-CM

## 2013-03-25 DIAGNOSIS — Z79899 Other long term (current) drug therapy: Secondary | ICD-10-CM

## 2013-03-25 NOTE — Progress Notes (Signed)
Patient ID: Jane Davidson, female   DOB: 1941/07/23, 72 y.o.   MRN: [id number]  The patient is here for annual Medicare wellness examination and management of other chronic and acute problems.  Had polyp on follow up coloscopy by [facility] in February 2014 ,  mammogram is scheduled,  And her PAP smear was normal in 2013.  Cc: is persistent left hip pain.  The pain has been present for 3 months,  is brought on with walking and relieved with rest.  It as aggravated by climbing steps and bending over and  radiates into thigh but does not radiate below knee.     The risk factors are reflected in the social history.  The roster of all physicians providing medical care to patient - is listed in the Snapshot section of the chart.  Activities of daily living:  The patient is 100% independent in all ADLs: dressing, toileting, feeding as well as independent mobility  Home safety : The patient has smoke detectors in the home. They wear seatbelts.  There are no firearms at home. There is no violence in the home.   There is no risks for hepatitis, STDs or HIV. There is no   history of blood transfusion. They have no travel history to infectious disease endemic areas of the world.  The patient has seen their dentist in the last six month. They have seen their eye doctor in the last year. They admit to slight hearing difficulty with regard to whispered voices and some television programs.  They have deferred audiologic testing in the last year.  They do not  have excessive sun exposure. Discussed the need for sun protection: hats, long sleeves and use of sunscreen if there is significant sun exposure.   Diet: the importance of a healthy diet is discussed. They do have a healthy diet.  The benefits of regular aerobic exercise were discussed. She walks 4 times per week ,  20 minutes.   Depression screen: there are no signs or vegative symptoms of depression- irritability, change in appetite, anhedonia,  sadness/tearfullness.  Cognitive assessment: the patient manages all their financial and personal affairs and is actively engaged. They could relate day,date,year and events; recalled 2/3 objects at 3 minutes; performed clock-face test normally.  The following portions of the patient's history were reviewed and updated as appropriate: allergies, current medications, past family history, past medical history,  past surgical history, past social history  and problem list.  Visual acuity was not assessed per patient preference since she has regular follow up with her ophthalmologist. Hearing and body mass index were assessed and reviewed.   During the course of the visit the patient was educated and counseled about appropriate screening and preventive services including : fall prevention , diabetes screening, nutrition counseling, colorectal cancer screening, and recommended immunizations.    Objective:  BP 144/76  Pulse 72  Temp(Src) 98.3 F (36.8 C) (Oral)  Resp 16  Ht 5\' 4"  (1.626 m)  Wt 170 lb 8 oz (77.338 kg)  BMI 29.25 kg/m2  SpO2 98%  General Appearance:    Alert, cooperative, no distress, appears stated age  Head:    Normocephalic, without obvious abnormality, atraumatic  Eyes:    PERRL, conjunctiva/corneas clear, EOM's intact, fundi    benign, both eyes  Ears:    Normal TM's and external ear canals, both ears  Nose:   Nares normal, septum midline, mucosa normal, no drainage    or sinus tenderness  Throat:   Lips,  mucosa, and tongue normal; teeth and gums normal  Neck:   Supple, symmetrical, trachea midline, no adenopathy;    thyroid:  no enlargement/tenderness/nodules; no carotid   bruit or JVD  Back:     Symmetric, no curvature, ROM normal, no CVA tenderness  Lungs:     Clear to auscultation bilaterally, respirations unlabored  Chest Wall:    No tenderness or deformity   Heart:    Regular rate and rhythm, S1 and S2 normal, no murmur, rub   or gallop  Breast Exam:    No  tenderness, masses, or nipple abnormality  Abdomen:     Soft, non-tender, bowel sounds active all four quadrants,    no masses, no organomegaly  Genitalia:    Pelvic: cervix normal in appearance, external genitalia normal, no adnexal masses or tenderness, no cervical motion tenderness, rectovaginal septum normal, uterus normal size, shape, and consistency and vagina normal without discharge  Extremities:   Extremities normal, atraumatic, no cyanosis or edema  Pulses:   2+ and symmetric all extremities  Skin:   Skin color, texture, turgor normal, no rashes or lesions  Lymph nodes:   Cervical, supraclavicular, and axillary nodes normal  Neurologic:   CNII-XII intact, normal strength, sensation and reflexes    Throughout. Left hip ROM reduced  Secondary to pain with internal rotation.    Assessment and plan  Routine general medical examination at a health care facility Annual exam including breast , pelvic exam  were done today.  Evaluate of acute complaints was also done per patient request.  PAP was normal last year, and she is up to date on colon ca screening.   Left hip pain ddx includes AVN hip given history of tobacco abuse,  DJD and PAD.  Plain films ordered.  Pulses are normal.   Tobacco abuse counseling Smoking cessation instruction/counseling given:  counseled patient on the dangers of tobacco use, advised patient to stop smoking, and reviewed strategies to maximize success. Plain chest x ray was ordered to evaluate persistent cough.  History of peripheral vascular disease With prior interventions.  Legs are well perfused currently and DPs are normal.    Vitamin D deficiency Repeat vit d has been ordered.  She has had megadose replacement in the past.   Hypothyroidism tsh is due for repeat evaluation.   COPD (chronic obstructive pulmonary disease) She is currently asymptomatic and using her inhalers as prescribed.   Smoking cessation encouraged.      Updated Medication  List Outpatient Encounter Prescriptions as of 03/25/2013  Medication Sig Dispense Refill  . ADVAIR DISKUS 100-50 MCG/DOSE AEPB ONE PUFF EVERY 12 HOURS                 AS DIRECTED. RINSE MOUTH          AFTER USING  60 each  5  . cyanocobalamin 100 MCG tablet Take 100 mcg by mouth daily.        . Lactulose 20 GM/30ML SOLN Take 30 mLs (20 g total) by mouth every 6 (six) hours as needed (to relive constipation ,  not more than once a week).  240 mL  1  . sertraline (ZOLOFT) 50 MG tablet Take 50 mg by mouth daily.        . simvastatin (ZOCOR) 20 MG tablet TAKE ONE TABLET AT BEDTIME  Generic for ZOCOR 20  90 tablet  3  . SYNTHROID 100 MCG tablet TAKE ONE (1) TABLET EACH DAY  90 tablet  3  . Biotin 10 MG TABS Take by mouth.        . etodolac (LODINE XL) 600 MG 24 hr tablet Take 1 tablet (600 mg total) by mouth daily.  30 tablet  3  . HYDROcodone-homatropine (HYCODAN) 5-1.5 MG/5ML syrup Take 5 mLs by mouth every 8 (eight) hours as needed for cough.  180 mL  0  . [DISCONTINUED] levofloxacin (LEVAQUIN) 500 MG tablet Take 1 tablet (500 mg total) by mouth daily.  7 tablet  0  . [DISCONTINUED] predniSONE (STERAPRED UNI-PAK) 10 MG tablet 6 tablets on Day 1 , then reduce by 1 tablet daily until gone  21 tablet  0   No facility-administered encounter medications on file as of 03/25/2013.

## 2013-03-25 NOTE — Patient Instructions (Addendum)
You had your annual Medicare wellness exam today  We will contact you with the bloodwork results  Return for fasting labs, and go to [facility] office (up on [address] in [place]) for your chest x ray and left hip  rasy

## 2013-03-27 ENCOUNTER — Encounter: Payer: Self-pay | Admitting: Internal Medicine

## 2013-03-27 DIAGNOSIS — M25552 Pain in left hip: Secondary | ICD-10-CM | POA: Insufficient documentation

## 2013-03-27 DIAGNOSIS — Z0000 Encounter for general adult medical examination without abnormal findings: Secondary | ICD-10-CM | POA: Insufficient documentation

## 2013-03-27 NOTE — Assessment & Plan Note (Signed)
ddx includes AVN hip given history of tobacco abuse,  DJD and PAD.  Plain films ordered.  Pulses are normal.

## 2013-03-27 NOTE — Assessment & Plan Note (Signed)
With prior interventions.  Legs are well perfused currently and DPs are normal.

## 2013-03-27 NOTE — Assessment & Plan Note (Signed)
Annual exam including breast , pelvic exam  were done today.  Evaluate of acute complaints was also done per patient request.  PAP was normal last year, and she is up to date on colon ca screening.

## 2013-03-27 NOTE — Assessment & Plan Note (Signed)
She is currently asymptomatic and using her inhalers as prescribed.   Smoking cessation encouraged.

## 2013-03-27 NOTE — Assessment & Plan Note (Addendum)
Smoking cessation instruction/counseling given:  counseled patient on the dangers of tobacco use, advised patient to stop smoking, and reviewed strategies to maximize success. Plain chest x ray was ordered to evaluate persistent cough.

## 2013-03-27 NOTE — Assessment & Plan Note (Signed)
tsh is due for repeat evaluation.

## 2013-03-27 NOTE — Assessment & Plan Note (Signed)
Repeat vit d has been ordered.  She has had megadose replacement in the past.

## 2013-03-30 ENCOUNTER — Ambulatory Visit: Payer: Self-pay | Admitting: Internal Medicine

## 2013-03-30 LAB — HM MAMMOGRAPHY

## 2013-04-01 ENCOUNTER — Other Ambulatory Visit (INDEPENDENT_AMBULATORY_CARE_PROVIDER_SITE_OTHER): Payer: Medicare Other

## 2013-04-01 DIAGNOSIS — E559 Vitamin D deficiency, unspecified: Secondary | ICD-10-CM

## 2013-04-01 DIAGNOSIS — E039 Hypothyroidism, unspecified: Secondary | ICD-10-CM

## 2013-04-01 DIAGNOSIS — Z79899 Other long term (current) drug therapy: Secondary | ICD-10-CM

## 2013-04-01 DIAGNOSIS — E785 Hyperlipidemia, unspecified: Secondary | ICD-10-CM

## 2013-04-01 LAB — CBC WITH DIFFERENTIAL/PLATELET
Basophils Absolute: 0.1 10*3/uL (ref 0.0–0.1)
Eosinophils Absolute: 0.5 10*3/uL (ref 0.0–0.7)
Hemoglobin: 12.7 g/dL (ref 12.0–15.0)
Lymphocytes Relative: 17 % (ref 12.0–46.0)
Lymphs Abs: 1.5 10*3/uL (ref 0.7–4.0)
MCHC: 33.5 g/dL (ref 30.0–36.0)
Neutro Abs: 6.3 10*3/uL (ref 1.4–7.7)
RDW: 13.9 % (ref 11.5–14.6)

## 2013-04-01 LAB — LIPID PANEL
HDL: 48.3 mg/dL (ref >39.00)
Total CHOL/HDL Ratio: 3
Triglycerides: 64 mg/dL (ref 0.0–149.0)

## 2013-04-01 LAB — COMPREHENSIVE METABOLIC PANEL
ALT: 18 U/L (ref 0–35)
AST: 19 U/L (ref 0–37)
Alkaline Phosphatase: 61 U/L (ref 39–117)
CO2: 25 mEq/L (ref 19–32)
Creatinine, Ser: 0.8 mg/dL (ref 0.4–1.2)
GFR: 79.85 mL/min (ref >60.00)
Total Bilirubin: 0.5 mg/dL (ref 0.3–1.2)

## 2013-04-29 ENCOUNTER — Encounter: Payer: Self-pay | Admitting: Internal Medicine

## 2013-07-29 ENCOUNTER — Other Ambulatory Visit: Payer: Self-pay | Admitting: Internal Medicine

## 2013-09-26 ENCOUNTER — Ambulatory Visit (INDEPENDENT_AMBULATORY_CARE_PROVIDER_SITE_OTHER): Payer: Medicare Other | Admitting: Internal Medicine

## 2013-09-26 ENCOUNTER — Encounter: Payer: Self-pay | Admitting: Internal Medicine

## 2013-09-26 VITALS — BP 136/68 | HR 83 | Temp 98.3°F | Resp 14 | Wt 173.5 lb

## 2013-09-26 DIAGNOSIS — L02419 Cutaneous abscess of limb, unspecified: Secondary | ICD-10-CM

## 2013-09-26 DIAGNOSIS — R58 Hemorrhage, not elsewhere classified: Secondary | ICD-10-CM

## 2013-09-26 DIAGNOSIS — D649 Anemia, unspecified: Secondary | ICD-10-CM

## 2013-09-26 DIAGNOSIS — I998 Other disorder of circulatory system: Secondary | ICD-10-CM

## 2013-09-26 DIAGNOSIS — Z716 Tobacco abuse counseling: Secondary | ICD-10-CM

## 2013-09-26 DIAGNOSIS — E039 Hypothyroidism, unspecified: Secondary | ICD-10-CM

## 2013-09-26 DIAGNOSIS — F17200 Nicotine dependence, unspecified, uncomplicated: Secondary | ICD-10-CM

## 2013-09-26 DIAGNOSIS — Z1211 Encounter for screening for malignant neoplasm of colon: Secondary | ICD-10-CM

## 2013-09-26 DIAGNOSIS — E559 Vitamin D deficiency, unspecified: Secondary | ICD-10-CM

## 2013-09-26 DIAGNOSIS — L03116 Cellulitis of left lower limb: Secondary | ICD-10-CM

## 2013-09-26 DIAGNOSIS — Z7189 Other specified counseling: Secondary | ICD-10-CM

## 2013-09-26 DIAGNOSIS — R5381 Other malaise: Secondary | ICD-10-CM

## 2013-09-26 MED ORDER — CEPHALEXIN 500 MG PO CAPS: 500.0000 mg | ORAL_CAPSULE | Freq: Three times a day (TID) | ORAL | Status: DC

## 2013-09-26 MED ORDER — CULTURELLE DIGESTIVE HEALTH PO CAPS: ORAL_CAPSULE | ORAL | Status: DC

## 2013-09-26 NOTE — Progress Notes (Signed)
Patient ID: Jane Davidson, female   DOB: 10-12-1941, 72 y.o.   MRN: [id number]   Patient Active Problem List   Diagnosis Date Noted  . Cellulitis of leg, left 09/27/2013  . Anemia 09/27/2013  . Routine general medical examination at a health care facility 03/27/2013  . COPD exacerbation 01/02/2013  . Screening for colon cancer 10/16/2011  . Tobacco abuse counseling 08/09/2011  . History of peripheral vascular disease   . Bursitis, ischial   . Osteopenia   . Tobacco abuse disorder   . Vitamin D deficiency   . Arrhythmia 07/05/2011  . Hypothyroidism 07/05/2011  . 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 COPD (chronic obstructive pulmonary disease)   . Hyperlipidemia   . Screening for breast cancer 02/11/2011    Subjective:  CC:   Chief Complaint  Patient presents with  . Follow-up    6 month    HPI:   Jane Davidson a 72 y.o. female who presents with  wound from a skin biopsy done last Monday by dermatologist Dr. 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  As been cleaning wound with with water while showering,  And applying hydrogen peroxide and  topical abx.  Wound is red , draining and tender.  Also has a large and is the dorsum of her right hand along with a dog bite from her vaccinated indoor dog which occurred this morning. She denies fevers decreased mobility of the fingers and swelling.   Her left hip pain has resolved.   Past Medical History  Diagnosis Date  . Hyperlipidemia   . Bursitis NEC   . Osteopenia 2009     T scores - 1.5 DEXA 2009  . Tobacco abuse disorder   . COPD (chronic obstructive pulmonary disease)   . hypothyroid   . Vitamin D deficiency April 2011    replaced withDrsido for level of 11.3 ng/ml  . Vitamin D deficiency April 2011    replaced withDrsido for level of 11.3 ng/ml  . Screening for breast cancer May 2012    Jane Davidson  . Abnormal Pap smear of cervix 2001    Biopsy normal   . History of peripheral vascular disease 2001    s/p bilateral aorto-iliac-femoral bypass /[facility]    Past Surgical History  Procedure Laterality Date  . Hernia repair  Jun 2011    Jane Davidson   . Cataract extraction  Sept 2011  . Cholecystectomy    . Tubal ligation         The following portions of the patient's history were reviewed and updated as appropriate: Allergies, current medications, and problem list.    Review of Systems:   12 Pt  review of systems was negative except those addressed in the HPI,     History   Social History  . Marital Status: Widowed    Spouse Name: N/A    Number of Children: N/A  . Years of Education: N/A   Occupational History  . Not on file.   Social History Main Topics  . Smoking status: Current Every Day Smoker -- 1.00 packs/day    Types: Cigarettes  . Smokeless tobacco: Never Used  . Alcohol Use: No  . Drug Use: No  . Sexual Activity: Not Currently   Other Topics Concern  . Not on file   Social History Narrative  . No narrative on file    Objective:  Filed Vitals:   09/26/13 1542  BP: 136/68  Pulse: 83  Temp: 98.3 F (36.8 C)  Resp: 14     General appearance: alert, cooperative and appears stated age Ears: normal TM's and external ear canals both ears Throat: lips, mucosa, and tongue normal; teeth and gums normal Neck: no adenopathy, no carotid bruit, supple, symmetrical, trachea midline and thyroid not enlarged, symmetric, no tenderness/mass/nodules Back: symmetric, no curvature. ROM normal. No CVA tenderness. Lungs: clear to auscultation bilaterally Heart: regular rate and rhythm, S1, S2 normal, no murmur, click, rub or gallop Abdomen: soft, non-tender; bowel sounds normal; no masses,  no organomegaly Pulses: 2+ and symmetric Skin: Left tibial wound covered in exudate. Erythema and warmth and a 4 cm radius surrounding wound.  Dorsal side of right hand is covered in ecchymosis with 2 puncture wounds noted in  over the thenar eminence Lymph nodes: Cervical, supraclavicular, and axillary nodes normal.  Assessment and Plan:  Cellulitis of leg, left She appears to have an infected leg wound which resulted from a skin biopsy done last week. She has been cleaning it with tap water and topical antibiotics and hydrogen peroxide. The skin around the wound is warm and red and a 4 cm circumference. The wound has a lot of slough in the bed. I am referring her back to Dr. Orson Davidson for debridement. She does not do debridement we'll send her to wound care center. I'm putting her on empiric antibiotics and changing her wound care regimen to add Telfa nonstick pad , sterile saline solution and no topical antibiotics.  Anemia Etiology unclear. CBC was checked to rule out thrombus cytopenia and leukocytosis due to bruising of hand and cellulitis. Her last colonoscopy was in 2011 at which time polyps were noted. She is a smoker as well. We will need to check iron and B12 studies as well as an immunochemical fecal occult blood tests.  Screening for colon cancer Done in 2011 at [facility]. Per patiemt. polyps found.  Records needed     Tobacco abuse counseling The patient was counseled on the dangers of tobacco use, and was advised to quit and reluctant to quit.  Reviewed strategies to maximize success, including removing cigarettes and smoking materials from environment and stress management.  Hypothyroidism Thyroid function is WNL on current dose.  No current changes needed.   Lab Results  Component Value Date   TSH 0.85 09/26/2013      Updated Medication List Outpatient Encounter Prescriptions as of 09/26/2013  Medication Sig  . ADVAIR DISKUS 100-50 MCG/DOSE AEPB ONE PUFF EVERY 12 HOURS AS DIRECTED. RINSE MOUTH AFTER USING  . Biotin 10 MG TABS Take by mouth.    . Cholecalciferol (VITAMIN D3) 1000 UNITS CAPS Take 1 capsule by mouth daily.  . cyanocobalamin 100 MCG tablet Take 100 mcg by mouth daily.    .  fluticasone (FLONASE) 50 MCG/ACT nasal spray Place 1 spray into both nostrils daily.  . Lactulose 20 GM/30ML SOLN Take 30 mLs (20 g total) by mouth every 6 (six) hours as needed (to relive constipation ,  not more than once a week).  . sertraline (ZOLOFT) 50 MG tablet Take 50 mg by mouth daily.    . simvastatin (ZOCOR) 20 MG tablet TAKE ONE TABLET AT BEDTIME  Generic for ZOCOR 20  . SYNTHROID 100 MCG tablet TAKE ONE (1) TABLET EACH DAY  . cephALEXin (KEFLEX) 500 MG capsule Take 1 capsule (500 mg total) by mouth 3 (three) times daily.  . Lactobacillus-Inulin (CULTURELLE DIGESTIVE HEALTH) CAPS 1 capsule daily for two weeks  . [DISCONTINUED] etodolac (LODINE XL) 600 MG 24 hr tablet Take 1 tablet (600 mg total) by mouth daily.  . [DISCONTINUED] HYDROcodone-homatropine (HYCODAN) 5-1.5 MG/5ML syrup Take 5 mLs by mouth every 8 (eight) hours as needed for cough.     Orders Placed This Encounter  Procedures  . Fecal occult blood, imunochemical  . CBC with Differential  . Comprehensive metabolic panel  . TSH  . Vit D  25 hydroxy (rtn osteoporosis monitoring)  . Iron and TIBC  . Ferritin  . Vitamin B12  . Folate RBC  . Ambulatory referral to Dermatology    No Follow-up on file.

## 2013-09-26 NOTE — Patient Instructions (Addendum)
Your leg wound looks a little infected.  I am trreating you with antibiotics to cover your dog bite and your wound infection. Please take a probiotic ( Align, Floraque or Culturelle) while you are on the antibiotic to prevent a serious antibiotic associated diarrhea  Called clostridium dificile colitis and a vaginal yeast infection   Please stop using the tap water,  Hydrogen peroxide and ointment on it  Use only sterile saline on it and use the nonstick telfa pads  To cover it .  Change the pad whenever it gets moist.  I an making a Referral to Dr Orson Aloe or the wound center  To debride the slough from the wound

## 2013-09-27 ENCOUNTER — Encounter: Payer: Self-pay | Admitting: Internal Medicine

## 2013-09-27 DIAGNOSIS — D649 Anemia, unspecified: Secondary | ICD-10-CM | POA: Insufficient documentation

## 2013-09-27 DIAGNOSIS — L03116 Cellulitis of left lower limb: Secondary | ICD-10-CM | POA: Insufficient documentation

## 2013-09-27 LAB — COMPREHENSIVE METABOLIC PANEL
ALT: 18 U/L (ref 0–35)
AST: 23 U/L (ref 0–37)
Alkaline Phosphatase: 58 U/L (ref 39–117)
Chloride: 104 mEq/L (ref 96–112)
Creatinine, Ser: 0.8 mg/dL (ref 0.4–1.2)
Total Bilirubin: 0.3 mg/dL (ref 0.3–1.2)

## 2013-09-27 LAB — CBC WITH DIFFERENTIAL/PLATELET
Basophils Relative: 0.7 % (ref 0.0–3.0)
Eosinophils Absolute: 0.4 10*3/uL (ref 0.0–0.7)
Hemoglobin: 11.1 g/dL — ABNORMAL LOW (ref 12.0–15.0)
MCHC: 34.1 g/dL (ref 30.0–36.0)
MCV: 90.5 fl (ref 78.0–100.0)
Monocytes Absolute: 0.5 10*3/uL (ref 0.1–1.0)
Neutro Abs: 6.2 10*3/uL (ref 1.4–7.7)
RBC: 3.58 Mil/uL — ABNORMAL LOW (ref 3.87–5.11)

## 2013-09-27 NOTE — Assessment & Plan Note (Signed)
The patient was counseled on the dangers of tobacco use, and was advised to quit and reluctant to quit.  Reviewed strategies to maximize success, including removing cigarettes and smoking materials from environment and stress management.

## 2013-09-27 NOTE — Assessment & Plan Note (Signed)
Thyroid function is WNL on current dose.  No current changes needed.   Lab Results  Component Value Date   TSH 0.85 09/26/2013

## 2013-09-27 NOTE — Assessment & Plan Note (Signed)
She appears to have an infected leg wound which resulted from a skin biopsy done last week. She has been cleaning it with tap water and topical antibiotics and hydrogen peroxide. The skin around the wound is warm and red and a 4 cm circumference. The wound has a lot of slough in the bed. I am referring her back to Dr. Orson Aloe for debridement. She does not do debridement we'll send her to wound care center. I'm putting her on empiric antibiotics and changing her wound care regimen to add Telfa nonstick pad , sterile saline solution and no topical antibiotics.

## 2013-09-27 NOTE — Assessment & Plan Note (Signed)
Done in 2011 at [facility] Per patiemt. polyps found.  Records needed

## 2013-09-27 NOTE — Assessment & Plan Note (Signed)
Etiology unclear. CBC was checked to rule out thrombus cytopenia and leukocytosis due to bruising of hand and cellulitis. Her last colonoscopy was in 2011 at which time polyps were noted. She is a smoker as well. We will need to check iron and B12 studies as well as an immunochemical fecal occult blood tests.

## 2013-10-05 ENCOUNTER — Other Ambulatory Visit (INDEPENDENT_AMBULATORY_CARE_PROVIDER_SITE_OTHER): Payer: Medicare Other

## 2013-10-05 DIAGNOSIS — D649 Anemia, unspecified: Secondary | ICD-10-CM

## 2013-10-05 LAB — IRON AND TIBC
%SAT: 23 % (ref 20–55)
Iron: 77 ug/dL (ref 42–145)
TIBC: 341 ug/dL (ref 250–470)

## 2013-10-06 LAB — FOLATE RBC: RBC Folate: 874 ng/mL (ref >280)

## 2013-10-11 ENCOUNTER — Encounter: Payer: Self-pay | Admitting: *Deleted

## 2013-10-12 ENCOUNTER — Telehealth: Payer: Self-pay | Admitting: *Deleted

## 2013-10-12 NOTE — Telephone Encounter (Signed)
  Lavage your sinuses twice daily with Simply saline nasal spray.  Use benadryl 25 mg every 8 hours for runny nose and 25 to 50 mg at bedtime for night time cough from post nasal drip, and Sudafed PE 10 to 30 every 8 hours to manage the drainage and congestion.   OR  if you develop T > 100.4,  Green nasal discharge,  Or facial pain,  Make appt

## 2013-10-12 NOTE — Telephone Encounter (Signed)
Pt called for lab results. Reported. Pt then stated she started with a non productive cough x 2-3 days at night. Requesting a Rx to be called in for bronchitis.  Denies fever, SOB, wheezing. States also some rhinorrhea. Using Robitussin for cough and that is effective. Advised to continue with the OTC treatment of symptoms, call office with worsening or persistent symptoms. What else would you like me to advise pt?

## 2013-10-12 NOTE — Telephone Encounter (Signed)
Pt notified and verbalized understanding.

## 2013-10-26 ENCOUNTER — Other Ambulatory Visit: Payer: Self-pay | Admitting: Internal Medicine

## 2013-10-28 ENCOUNTER — Telehealth: Payer: Self-pay | Admitting: Internal Medicine

## 2013-10-28 NOTE — Telephone Encounter (Signed)
Pt called she has [employer] and has an appointment [facility] with Dr Evelena Asa on 11/21/13 they told her to call her to get a referral Mcarthur Rossetti number is [phone] Pt stated she would not be able to bring in a copy of her insurance care

## 2013-11-23 NOTE — Telephone Encounter (Signed)
Referral not needed since porfolio is [facility].

## 2014-02-23 ENCOUNTER — Telehealth: Payer: Self-pay | Admitting: Internal Medicine

## 2014-02-23 NOTE — Telephone Encounter (Signed)
Received fax from [facility] back Treating Provider Josue Hector # of Visits 6 Start Date 02/20/14 End Date 05/21/14 CPT 99499 DX 709.9, T03.54,656.2

## 2014-04-06 ENCOUNTER — Encounter: Payer: Self-pay | Admitting: Internal Medicine

## 2014-04-06 ENCOUNTER — Ambulatory Visit (INDEPENDENT_AMBULATORY_CARE_PROVIDER_SITE_OTHER): Payer: Commercial Managed Care - HMO | Admitting: Internal Medicine

## 2014-04-06 VITALS — BP 122/68 | HR 70 | Temp 98.1°F | Resp 18 | Ht 64.0 in | Wt 172.5 lb

## 2014-04-06 DIAGNOSIS — M8580 Other specified disorders of bone density and structure, unspecified site: Secondary | ICD-10-CM

## 2014-04-06 DIAGNOSIS — E559 Vitamin D deficiency, unspecified: Secondary | ICD-10-CM

## 2014-04-06 DIAGNOSIS — M899 Disorder of bone, unspecified: Secondary | ICD-10-CM

## 2014-04-06 DIAGNOSIS — Z1239 Encounter for other screening for malignant neoplasm of breast: Secondary | ICD-10-CM

## 2014-04-06 DIAGNOSIS — E785 Hyperlipidemia, unspecified: Secondary | ICD-10-CM

## 2014-04-06 DIAGNOSIS — D538 Other specified nutritional anemias: Secondary | ICD-10-CM

## 2014-04-06 DIAGNOSIS — M949 Disorder of cartilage, unspecified: Secondary | ICD-10-CM

## 2014-04-06 DIAGNOSIS — E039 Hypothyroidism, unspecified: Secondary | ICD-10-CM

## 2014-04-06 DIAGNOSIS — Z23 Encounter for immunization: Secondary | ICD-10-CM

## 2014-04-06 DIAGNOSIS — Z79899 Other long term (current) drug therapy: Secondary | ICD-10-CM

## 2014-04-06 LAB — COMPREHENSIVE METABOLIC PANEL
ALBUMIN: 3.9 g/dL (ref 3.5–5.2)
ALK PHOS: 63 U/L (ref 39–117)
ALT: 15 U/L (ref 0–35)
AST: 16 U/L (ref 0–37)
BUN: 14 mg/dL (ref 6–23)
CALCIUM: 9.1 mg/dL (ref 8.4–10.5)
CHLORIDE: 104 meq/L (ref 96–112)
CO2: 28 mEq/L (ref 19–32)
Creatinine, Ser: 0.8 mg/dL (ref 0.4–1.2)
GFR: 76.14 mL/min (ref >60.00)
GLUCOSE: 98 mg/dL (ref 70–99)
POTASSIUM: 4.5 meq/L (ref 3.5–5.1)
SODIUM: 137 meq/L (ref 135–145)
TOTAL PROTEIN: 6.7 g/dL (ref 6.0–8.3)
Total Bilirubin: 0.5 mg/dL (ref 0.2–1.2)

## 2014-04-06 LAB — CBC WITH DIFFERENTIAL/PLATELET
BASOS ABS: 0 10*3/uL (ref 0.0–0.1)
Basophils Relative: 0.5 % (ref 0.0–3.0)
Eosinophils Absolute: 0.5 10*3/uL (ref 0.0–0.7)
Eosinophils Relative: 5.5 % — ABNORMAL HIGH (ref 0.0–5.0)
HCT: 36.7 % (ref 36.0–46.0)
Hemoglobin: 12.5 g/dL (ref 12.0–15.0)
LYMPHS PCT: 16.6 % (ref 12.0–46.0)
Lymphs Abs: 1.5 10*3/uL (ref 0.7–4.0)
MCHC: 33.9 g/dL (ref 30.0–36.0)
MCV: 90.4 fl (ref 78.0–100.0)
MONOS PCT: 6.7 % (ref 3.0–12.0)
Monocytes Absolute: 0.6 10*3/uL (ref 0.1–1.0)
Neutro Abs: 6.2 10*3/uL (ref 1.4–7.7)
Neutrophils Relative %: 70.7 % (ref 43.0–77.0)
PLATELETS: 261 10*3/uL (ref 150.0–400.0)
RBC: 4.06 Mil/uL (ref 3.87–5.11)
RDW: 13.7 % (ref 11.5–15.5)
WBC: 8.8 10*3/uL (ref 4.0–10.5)

## 2014-04-06 LAB — TSH: TSH: 0.33 u[IU]/mL — AB (ref 0.35–4.50)

## 2014-04-06 LAB — LIPID PANEL
Cholesterol: 148 mg/dL (ref 0–200)
HDL: 44.8 mg/dL (ref >39.00)
LDL CALC: 86 mg/dL (ref 0–99)
NONHDL: 103.2
Total CHOL/HDL Ratio: 3
Triglycerides: 84 mg/dL (ref 0.0–149.0)
VLDL: 16.8 mg/dL (ref 0.0–40.0)

## 2014-04-06 LAB — VITAMIN D 25 HYDROXY (VIT D DEFICIENCY, FRACTURES): VITD: 24.2 ng/mL

## 2014-04-06 LAB — VITAMIN B12: VITAMIN B 12: 517 pg/mL (ref 211–911)

## 2014-04-06 MED ORDER — LEVOTHYROXINE SODIUM 88 MCG PO TABS: 88.0000 ug | ORAL_TABLET | Freq: Every day | ORAL | Status: DC

## 2014-04-06 NOTE — Progress Notes (Signed)
Patient ID: Jane Davidson, female   DOB: 1941/04/28, 73 y.o.   MRN: [id number]   Patient Active Problem List   Diagnosis Date Noted  . Cellulitis of leg, left 09/27/2013  . Anemia 09/27/2013  . Routine general medical examination at a health care facility 03/27/2013  . COPD exacerbation 01/02/2013  . Screening for colon cancer 10/16/2011  . Tobacco abuse counseling 08/09/2011  . History of peripheral vascular disease   . Bursitis, ischial   . Osteopenia   . Tobacco abuse disorder   . Vitamin D deficiency   . Arrhythmia 07/05/2011  . Hypothyroidism 07/05/2011  . 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 COPD (chronic obstructive pulmonary disease)   . Hyperlipidemia   . Screening for breast cancer 02/11/2011    Subjective:  CC:   Chief Complaint  Patient presents with  . Follow-up    6 month patient reports fasting for blood work if needed.    HPI:   Jane Davidson is a 73 y.o. female who presents for 6 month follow up on chronic conditions including hypothyroidism. Tobacco abuse,  Anemia  And hyperlipidemia. Her chief complaint is feeling bloated a lot .  Eats yogurt daily.  Moves bowels daily,  sometimes has to strain.    Past Medical History  Diagnosis Date  . Hyperlipidemia   . Bursitis NEC   . Osteopenia 2009     T scores - 1.5 DEXA 2009  . Tobacco abuse disorder   . COPD (chronic obstructive pulmonary disease)   . hypothyroid   . Vitamin D deficiency April 2011    replaced withDrsido for level of 11.3 ng/ml  . Vitamin D deficiency April 2011    replaced withDrsido for level of 11.3 ng/ml  . Screening for breast cancer May 2012    Jane Davidson  . Abnormal Pap smear of cervix 2001    Biopsy normal  . History of peripheral vascular disease 2001    s/p bilateral aorto-iliac-femoral bypass /[facility]    Past Surgical History  Procedure Laterality Date  . Hernia repair  Jun 2011    Jane Davidson   . Cataract extraction  Sept 2011   . Cholecystectomy    . Tubal ligation         The following portions of the patient's history were reviewed and updated as appropriate: Allergies, current medications, and problem list.    Review of Systems:   Patient denies headache, fevers, malaise, unintentional weight loss, skin rash, eye pain, sinus congestion and sinus pain, sore throat, dysphagia,  hemoptysis , cough, dyspnea, wheezing, chest pain, palpitations, orthopnea, edema, abdominal pain, nausea, melena, diarrhea, constipation, flank pain, dysuria, hematuria, urinary  Frequency, nocturia, numbness, tingling, seizures,  Focal weakness, Loss of consciousness,  Tremor, insomnia, depression, anxiety, and suicidal ideation.     History   Social History  . Marital Status: Married    Spouse Name: N/A    Number of Children: N/A  . Years of Education: N/A   Occupational History  . Not on file.   Social History Main Topics  . Smoking status: Current Every Day Smoker -- 1.00 packs/day    Types: Cigarettes  . Smokeless tobacco: Never Used  . Alcohol Use: No  . Drug Use: No  . Sexual Activity: Not Currently   Other Topics Concern  . Not on file   Social History Narrative  . No narrative on file    Objective:  Filed Vitals:   04/06/14  0901  BP: 122/68  Pulse: 70  Temp: 98.1 F (36.7 C)  Resp: 18     General appearance: alert, cooperative and appears stated age Ears: normal TM's and external ear canals both ears Throat: lips, mucosa, and tongue normal; teeth and gums normal Neck: no adenopathy, no carotid bruit, supple, symmetrical, trachea midline and thyroid not enlarged, symmetric, no tenderness/mass/nodules Back: symmetric, no curvature. ROM normal. No CVA tenderness. Lungs: clear to auscultation bilaterally Heart: regular rate and rhythm, S1, S2 normal, no murmur, click, rub or gallop Abdomen: soft, non-tender; bowel sounds normal; no masses,  no organomegaly Pulses: 2+ and symmetric Skin: Skin  color, texture, turgor normal. No rashes or lesions Lymph nodes: Cervical, supraclavicular, and axillary nodes normal.  Assessment and Plan:  Hyperlipidemia Well controlled on current medications .  No changes toda.  Lab Results  Component Value Date   CHOL 148 04/06/2014   HDL 44.80 04/06/2014   LDLCALC 86 04/06/2014   TRIG 84.0 04/06/2014   CHOLHDL 3 04/06/2014   Lab Results  Component Value Date   ALT 15 04/06/2014   AST 16 04/06/2014   ALKPHOS 63 04/06/2014   BILITOT 0.5 04/06/2014     Hypothyroidism Your thyroid is overactive on current thyroid medication. I have sent a lower dose of levothyroxine to her pharmacy.  She is advised to  start the new dose ASAP and have a repeat TSH level after  6 weeks Lab Results  Component Value Date   TSH 0.33* 04/06/2014      Osteopenia Discussed the current controversies surrounding the risks and benefits of calcium supplementation.  Encouraged her to increase dietary calcium through natural foods including almond/coconut milk  Vitamin D deficiency Drisdol sent for use for 4 weeks for deficient levels.    Updated Medication List Outpatient Encounter Prescriptions as of 04/06/2014  Medication Sig  . Cholecalciferol (VITAMIN D3) 1000 UNITS CAPS Take 1 capsule by mouth daily.  . fluticasone (FLONASE) 50 MCG/ACT nasal spray Place 1 spray into both nostrils daily.  . sertraline (ZOLOFT) 50 MG tablet Take 50 mg by mouth daily.    . simvastatin (ZOCOR) 20 MG tablet TAKE ONE TABLET AT BEDTIME  . [DISCONTINUED] SYNTHROID 100 MCG tablet TAKE ONE (1) TABLET EACH DAY  . ADVAIR DISKUS 100-50 MCG/DOSE AEPB ONE PUFF EVERY 12 HOURS AS DIRECTED. RINSE MOUTH AFTER USING  . cyanocobalamin 100 MCG tablet Take 100 mcg by mouth daily.    . ergocalciferol (DRISDOL) 50000 UNITS capsule Take 1 capsule (50,000 Units total) by mouth once a week.  . Lactobacillus-Inulin ([place]) CAPS 1 capsule daily for two weeks  . Lactulose 20 GM/30ML SOLN  Take 30 mLs (20 g total) by mouth every 6 (six) hours as needed (to relive constipation ,  not more than once a week).  Marland Kitchen levothyroxine (SYNTHROID) 88 MCG tablet Take 1 tablet (88 mcg total) by mouth daily before breakfast.  . [DISCONTINUED] Biotin 10 MG TABS Take by mouth.    . [DISCONTINUED] cephALEXin (KEFLEX) 500 MG capsule Take 1 capsule (500 mg total) by mouth 3 (three) times daily.     Orders Placed This Encounter  Procedures  . Fecal occult blood, imunochemical  . HM MAMMOGRAPHY  . MM DIGITAL SCREENING BILATERAL  . Pneumococcal conjugate vaccine 13-valent  . Lipid panel  . Vit D  25 hydroxy (rtn osteoporosis monitoring)  . TSH  . CBC with Differential  . Comprehensive metabolic panel  . B12    No Follow-up on  file.

## 2014-04-06 NOTE — Assessment & Plan Note (Addendum)
Your thyroid is overactive on current thyroid medication. I have sent a lower dose of levothyroxine to her pharmacy.  She is advised to  start the new dose ASAP and have a repeat TSH level after  6 weeks Lab Results  Component Value Date   TSH 0.33* 04/06/2014

## 2014-04-06 NOTE — Progress Notes (Signed)
Pre-visit discussion using our clinic review tool. No additional management support is needed unless otherwise documented below in the visit note.  

## 2014-04-06 NOTE — Assessment & Plan Note (Addendum)
Well controlled on current medications .  No changes toda.  Lab Results  Component Value Date   CHOL 148 04/06/2014   HDL 44.80 04/06/2014   LDLCALC 86 04/06/2014   TRIG 84.0 04/06/2014   CHOLHDL 3 04/06/2014   Lab Results  Component Value Date   ALT 15 04/06/2014   AST 16 04/06/2014   ALKPHOS 63 04/06/2014   BILITOT 0.5 04/06/2014

## 2014-04-06 NOTE — Patient Instructions (Addendum)
You are doing well  Please continue to reduce your tobacco use gradually (1 cigarette less per day each week)  Return in 6 months for your annual exam   I recommend getting the majority of your calcium and Vitamin D  through diet rather than supplements given the recent association of calcium supplements with increased coronary artery calcium scores (You need 1200 mg daily )   Unsweetened almond/coconut milk is a great low calorie low carb, cholesterol free  way to increase your dietary calcium and vitamin D.  Try the blue Diamond  brand

## 2014-04-07 ENCOUNTER — Telehealth: Payer: Self-pay | Admitting: Internal Medicine

## 2014-04-07 NOTE — Telephone Encounter (Signed)
Relevant patient education mailed to patient.  

## 2014-04-08 ENCOUNTER — Encounter: Payer: Self-pay | Admitting: Internal Medicine

## 2014-04-08 MED ORDER — ERGOCALCIFEROL 1.25 MG (50000 UT) PO CAPS: 50000.0000 [IU] | ORAL_CAPSULE | ORAL | Status: DC

## 2014-04-08 NOTE — Assessment & Plan Note (Signed)
Drisdol sent for use for 4 weeks for deficient levels.

## 2014-04-08 NOTE — Assessment & Plan Note (Signed)
Discussed the current controversies surrounding the risks and benefits of calcium supplementation.  Encouraged her to increase dietary calcium through natural foods including almond/coconut milk

## 2014-04-10 ENCOUNTER — Encounter: Payer: Self-pay | Admitting: Adult Health

## 2014-04-10 ENCOUNTER — Ambulatory Visit (INDEPENDENT_AMBULATORY_CARE_PROVIDER_SITE_OTHER): Payer: Commercial Managed Care - HMO | Admitting: Adult Health

## 2014-04-10 VITALS — BP 122/64 | HR 74 | Temp 98.3°F | Resp 16 | Wt 177.8 lb

## 2014-04-10 DIAGNOSIS — L03114 Cellulitis of left upper limb: Secondary | ICD-10-CM | POA: Insufficient documentation

## 2014-04-10 DIAGNOSIS — IMO0002 Reserved for concepts with insufficient information to code with codable children: Secondary | ICD-10-CM

## 2014-04-10 MED ORDER — CEPHALEXIN 500 MG PO CAPS: 500.0000 mg | ORAL_CAPSULE | Freq: Two times a day (BID) | ORAL | Status: DC

## 2014-04-10 NOTE — Progress Notes (Signed)
Patient ID: Jane Davidson, female   DOB: 24-Mar-1941, 73 y.o.   MRN: [id number]   Subjective:    Patient ID: Jane Davidson, female    DOB: 01-Apr-1941, 73 y.o.   MRN: [id number]  HPI  Pt presents with redness, swelling and discomfort of her left upper extremity. She reports that she received her Prevnar vaccine on 04/06/14. Symptoms began shortly afterward. Had pain and tenderness at the injection site. Then redness and swelling appeared. Denies fever or chills. No other symptoms associated with this. She does report episode of transient palpitations on Saturday. This resolved. Reports starting thyroid medication.   Past Medical History  Diagnosis Date  . Hyperlipidemia   . Bursitis NEC   . Osteopenia 2009     T scores - 1.5 DEXA 2009  . Tobacco abuse disorder   . COPD (chronic obstructive pulmonary disease)   . hypothyroid   . Vitamin D deficiency April 2011    replaced withDrsido for level of 11.3 ng/ml  . Vitamin D deficiency April 2011    replaced withDrsido for level of 11.3 ng/ml  . Screening for breast cancer May 2012    [facility]  . Abnormal Pap smear of cervix 2001    Biopsy normal  . History of peripheral vascular disease 2001    s/p bilateral aorto-iliac-femoral bypass /[facility]    Current Outpatient Prescriptions on File Prior to Visit  Medication Sig Dispense Refill  . ADVAIR DISKUS 100-50 MCG/DOSE AEPB ONE PUFF EVERY 12 HOURS AS DIRECTED. RINSE MOUTH AFTER USING  60 each  5  . Cholecalciferol (VITAMIN D3) 1000 UNITS CAPS Take 1 capsule by mouth daily.      . cyanocobalamin 100 MCG tablet Take 100 mcg by mouth daily.        . ergocalciferol (DRISDOL) 50000 UNITS capsule Take 1 capsule (50,000 Units total) by mouth once a week.  4 capsule  0  . fluticasone (FLONASE) 50 MCG/ACT nasal spray Place 1 spray into both nostrils daily.      Marland Kitchen levothyroxine (SYNTHROID) 88 MCG tablet Take 1 tablet (88 mcg total) by mouth daily before breakfast.  90 tablet  1  .  sertraline (ZOLOFT) 50 MG tablet Take 50 mg by mouth daily.        . simvastatin (ZOCOR) 20 MG tablet TAKE ONE TABLET AT BEDTIME  90 tablet  1   No current facility-administered medications on file prior to visit.     Review of Systems  Musculoskeletal:       Left arm pain, swelling and redness that began on Friday 04/07/14  All other systems reviewed and are negative.      Objective:  BP 122/64  Pulse 74  Temp(Src) 98.3 F (36.8 C) (Oral)  Resp 16  Wt 177 lb 12 oz (80.627 kg)  SpO2 96%   Physical Exam  Constitutional: She is oriented to person, place, and time. No distress.  Cardiovascular: Normal rate, regular rhythm and intact distal pulses.   Pulmonary/Chest: Effort normal. No respiratory distress.  Musculoskeletal: Normal range of motion. She exhibits edema and tenderness.  Redness and edema of the left upper extremity  Neurological: She is alert and oriented to person, place, and time.  Skin: Skin is warm and dry.  Psychiatric: She has a normal mood and affect. Her behavior is normal. Judgment and thought content normal.      Assessment & Plan:   1. Cellulitis of left upper arm Localized minor skin infection. Received Prevnar vaccine  day prior. Will treat with Keflex 500 mg bid x 7 days. RTC if no improvement within 3-4 days or sooner if necessary.

## 2014-04-10 NOTE — Patient Instructions (Signed)
Start Keflex 500 mg twice a day for 7 days.   Cellulitis Cellulitis is an infection of the skin and the tissue beneath it. The infected area is usually red and tender. Cellulitis occurs most often in the arms and lower legs.  CAUSES  Cellulitis is caused by bacteria that enter the skin through cracks or cuts in the skin. The most common types of bacteria that cause cellulitis are Staphylococcus and Streptococcus. SYMPTOMS   Redness and warmth.  Swelling.  Tenderness or pain.  Fever. DIAGNOSIS  Your caregiver can usually determine what is wrong based on a physical exam. Blood tests may also be done. TREATMENT  Treatment usually involves taking an antibiotic medicine. HOME CARE INSTRUCTIONS   Take your antibiotics as directed. Finish them even if you start to feel better.  Keep the infected arm or leg elevated to reduce swelling.  Apply a warm cloth to the affected area up to 4 times per day to relieve pain.  Only take over-the-counter or prescription medicines for pain, discomfort, or fever as directed by your caregiver.  Keep all follow-up appointments as directed by your caregiver. SEEK MEDICAL CARE IF:   You notice red streaks coming from the infected area.  Your red area gets larger or turns dark in color.  Your bone or joint underneath the infected area becomes painful after the skin has healed.  Your infection returns in the same area or another area.  You notice a swollen bump in the infected area.  You develop new symptoms. SEEK IMMEDIATE MEDICAL CARE IF:   You have a fever.  You feel very sleepy.  You develop vomiting or diarrhea.  You have a general ill feeling (malaise) with muscle aches and pains. MAKE SURE YOU:   Understand these instructions.  Will watch your condition.  Will get help right away if you are not doing well or get worse. Document Released: 07/09/2005 Document Revised: 03/30/2012 Document Reviewed: 12/15/2011 [facility] Patient  Information 2015 [place], [place]. This information is not intended to replace advice given to you by your health care Brinklee Cisse. Make sure you discuss any questions you have with your health care Dushaun Okey.

## 2014-04-10 NOTE — Progress Notes (Signed)
Pre visit review using our clinic review tool, if applicable. No additional management support is needed unless otherwise documented below in the visit note. 

## 2014-05-04 ENCOUNTER — Other Ambulatory Visit (INDEPENDENT_AMBULATORY_CARE_PROVIDER_SITE_OTHER): Payer: Commercial Managed Care - HMO

## 2014-05-04 DIAGNOSIS — D538 Other specified nutritional anemias: Secondary | ICD-10-CM

## 2014-05-04 LAB — FECAL OCCULT BLOOD, IMMUNOCHEMICAL: FECAL OCCULT BLD: NEGATIVE

## 2014-05-08 ENCOUNTER — Encounter: Payer: Self-pay | Admitting: *Deleted

## 2014-05-09 ENCOUNTER — Other Ambulatory Visit: Payer: Self-pay | Admitting: Internal Medicine

## 2014-05-16 ENCOUNTER — Telehealth: Payer: Self-pay | Admitting: *Deleted

## 2014-05-16 DIAGNOSIS — M8580 Other specified disorders of bone density and structure, unspecified site: Secondary | ICD-10-CM

## 2014-05-16 DIAGNOSIS — E785 Hyperlipidemia, unspecified: Secondary | ICD-10-CM

## 2014-05-16 DIAGNOSIS — E559 Vitamin D deficiency, unspecified: Secondary | ICD-10-CM

## 2014-05-16 DIAGNOSIS — D649 Anemia, unspecified: Secondary | ICD-10-CM

## 2014-05-16 DIAGNOSIS — Z79899 Other long term (current) drug therapy: Secondary | ICD-10-CM

## 2014-05-16 DIAGNOSIS — E039 Hypothyroidism, unspecified: Secondary | ICD-10-CM

## 2014-05-16 NOTE — Telephone Encounter (Signed)
Pt is coming in tomorrow what labs and dx?  

## 2014-05-17 ENCOUNTER — Other Ambulatory Visit (INDEPENDENT_AMBULATORY_CARE_PROVIDER_SITE_OTHER): Payer: Commercial Managed Care - HMO

## 2014-05-17 ENCOUNTER — Telehealth: Payer: Self-pay | Admitting: Internal Medicine

## 2014-05-17 DIAGNOSIS — M899 Disorder of bone, unspecified: Secondary | ICD-10-CM

## 2014-05-17 DIAGNOSIS — E559 Vitamin D deficiency, unspecified: Secondary | ICD-10-CM

## 2014-05-17 DIAGNOSIS — E119 Type 2 diabetes mellitus without complications: Secondary | ICD-10-CM

## 2014-05-17 DIAGNOSIS — Z79899 Other long term (current) drug therapy: Secondary | ICD-10-CM

## 2014-05-17 DIAGNOSIS — D649 Anemia, unspecified: Secondary | ICD-10-CM

## 2014-05-17 DIAGNOSIS — E039 Hypothyroidism, unspecified: Secondary | ICD-10-CM

## 2014-05-17 DIAGNOSIS — M949 Disorder of cartilage, unspecified: Secondary | ICD-10-CM

## 2014-05-17 DIAGNOSIS — M8580 Other specified disorders of bone density and structure, unspecified site: Secondary | ICD-10-CM

## 2014-05-17 DIAGNOSIS — E785 Hyperlipidemia, unspecified: Secondary | ICD-10-CM

## 2014-05-17 LAB — TSH: TSH: 1.06 u[IU]/mL (ref 0.35–4.50)

## 2014-05-17 LAB — COMPREHENSIVE METABOLIC PANEL
ALBUMIN: 3.6 g/dL (ref 3.5–5.2)
ALK PHOS: 64 U/L (ref 39–117)
ALT: 20 U/L (ref 0–35)
AST: 21 U/L (ref 0–37)
BUN: 11 mg/dL (ref 6–23)
CO2: 27 meq/L (ref 19–32)
Calcium: 8.8 mg/dL (ref 8.4–10.5)
Chloride: 103 mEq/L (ref 96–112)
Creatinine, Ser: 0.8 mg/dL (ref 0.4–1.2)
GFR: 76.12 mL/min (ref >60.00)
Glucose, Bld: 129 mg/dL — ABNORMAL HIGH (ref 70–99)
POTASSIUM: 4.1 meq/L (ref 3.5–5.1)
SODIUM: 135 meq/L (ref 135–145)
TOTAL PROTEIN: 6.8 g/dL (ref 6.0–8.3)
Total Bilirubin: 0.6 mg/dL (ref 0.2–1.2)

## 2014-05-17 LAB — CBC WITH DIFFERENTIAL/PLATELET
BASOS ABS: 0 10*3/uL (ref 0.0–0.1)
Basophils Relative: 0.3 % (ref 0.0–3.0)
Eosinophils Absolute: 0.5 10*3/uL (ref 0.0–0.7)
Eosinophils Relative: 5.6 % — ABNORMAL HIGH (ref 0.0–5.0)
HEMATOCRIT: 36.9 % (ref 36.0–46.0)
Hemoglobin: 12.6 g/dL (ref 12.0–15.0)
LYMPHS ABS: 1.4 10*3/uL (ref 0.7–4.0)
Lymphocytes Relative: 16 % (ref 12.0–46.0)
MCHC: 34.3 g/dL (ref 30.0–36.0)
MCV: 90.6 fl (ref 78.0–100.0)
MONOS PCT: 6.2 % (ref 3.0–12.0)
Monocytes Absolute: 0.5 10*3/uL (ref 0.1–1.0)
NEUTROS PCT: 71.9 % (ref 43.0–77.0)
Neutro Abs: 6.1 10*3/uL (ref 1.4–7.7)
PLATELETS: 256 10*3/uL (ref 150.0–400.0)
RBC: 4.07 Mil/uL (ref 3.87–5.11)
RDW: 13.4 % (ref 11.5–15.5)
WBC: 8.5 10*3/uL (ref 4.0–10.5)

## 2014-05-17 LAB — LIPID PANEL
CHOL/HDL RATIO: 3
CHOLESTEROL: 142 mg/dL (ref 0–200)
HDL: 42.7 mg/dL (ref >39.00)
LDL CALC: 80 mg/dL (ref 0–99)
NonHDL: 99.3
Triglycerides: 95 mg/dL (ref 0.0–149.0)
VLDL: 19 mg/dL (ref 0.0–40.0)

## 2014-05-17 LAB — VITAMIN D 25 HYDROXY (VIT D DEFICIENCY, FRACTURES): VITD: 31.1 ng/mL (ref 30.00–100.00)

## 2014-06-06 ENCOUNTER — Encounter: Payer: Self-pay | Admitting: Internal Medicine

## 2014-06-06 ENCOUNTER — Ambulatory Visit (INDEPENDENT_AMBULATORY_CARE_PROVIDER_SITE_OTHER): Payer: Commercial Managed Care - HMO | Admitting: Internal Medicine

## 2014-06-06 VITALS — BP 146/78 | HR 75 | Temp 97.7°F | Resp 16 | Ht 64.0 in | Wt 178.5 lb

## 2014-06-06 DIAGNOSIS — R142 Eructation: Secondary | ICD-10-CM

## 2014-06-06 DIAGNOSIS — R140 Abdominal distension (gaseous): Secondary | ICD-10-CM

## 2014-06-06 DIAGNOSIS — L989 Disorder of the skin and subcutaneous tissue, unspecified: Secondary | ICD-10-CM

## 2014-06-06 DIAGNOSIS — R143 Flatulence: Secondary | ICD-10-CM

## 2014-06-06 DIAGNOSIS — Z716 Tobacco abuse counseling: Secondary | ICD-10-CM

## 2014-06-06 DIAGNOSIS — Z7189 Other specified counseling: Secondary | ICD-10-CM

## 2014-06-06 DIAGNOSIS — F17200 Nicotine dependence, unspecified, uncomplicated: Secondary | ICD-10-CM

## 2014-06-06 DIAGNOSIS — R141 Gas pain: Secondary | ICD-10-CM

## 2014-06-06 DIAGNOSIS — R6889 Other general symptoms and signs: Secondary | ICD-10-CM

## 2014-06-06 DIAGNOSIS — E119 Type 2 diabetes mellitus without complications: Secondary | ICD-10-CM

## 2014-06-06 MED ORDER — NICOTINE 14 MG/24HR TD PT24: 14.0000 mg | MEDICATED_PATCH | Freq: Every day | TRANSDERMAL | Status: DC

## 2014-06-06 NOTE — Progress Notes (Signed)
Patient ID: Jane Davidson, female   DOB: 03/13/1941, 73 y.o.   MRN: [id number]   Patient Active Problem List   Diagnosis Date Noted  . Abdominal bloating 06/08/2014  . DM type 2 (diabetes mellitus, type 2) 05/17/2014  . Cellulitis of left upper arm 04/10/2014  . Cellulitis of leg, left 09/27/2013  . Anemia 09/27/2013  . Routine general medical examination at a health care facility 03/27/2013  . COPD exacerbation 01/02/2013  . Screening for colon cancer 10/16/2011  . Tobacco abuse counseling 08/09/2011  . History of peripheral vascular disease   . Bursitis, ischial   . Osteopenia   . Tobacco abuse disorder   . Vitamin D deficiency   . Arrhythmia 07/05/2011  . Hypothyroidism 07/05/2011  . 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 COPD (chronic obstructive pulmonary disease)   . Hyperlipidemia   . Screening for breast cancer 02/11/2011    Subjective:  CC:   Chief Complaint  Patient presents with  . Follow-up  . Diabetes    HPI:   Jane Davidson is a 73 y.o. female who presents for   Past Medical History  Diagnosis Date  . Hyperlipidemia   . Bursitis NEC   . Osteopenia 2009     T scores - 1.5 DEXA 2009  . Tobacco abuse disorder   . COPD (chronic obstructive pulmonary disease)   . hypothyroid   . Vitamin D deficiency April 2011    replaced withDrsido for level of 11.3 ng/ml  . Vitamin D deficiency April 2011    replaced withDrsido for level of 11.3 ng/ml  . Screening for breast cancer May 2012    Jane Davidson  . Abnormal Pap smear of cervix 2001    Biopsy normal  . History of peripheral vascular disease 2001    s/p bilateral aorto-iliac-femoral bypass /[facility]    Past Surgical History  Procedure Laterality Date  . Hernia repair  Jun 2011    Jane Davidson   . Cataract extraction  Sept 2011  . Cholecystectomy    . Tubal ligation         The following portions of the patient's history were reviewed and updated as appropriate:  Allergies, current medications, and problem list.    Review of Systems:   Patient denies headache, fevers, malaise, unintentional weight loss, skin rash, eye pain, sinus congestion and sinus pain, sore throat, dysphagia,  hemoptysis , cough, dyspnea, wheezing, chest pain, palpitations, orthopnea, edema, abdominal pain, nausea, melena, diarrhea, constipation, flank pain, dysuria, hematuria, urinary  Frequency, nocturia, numbness, tingling, seizures,  Focal weakness, Loss of consciousness,  Tremor, insomnia, depression, anxiety, and suicidal ideation.     History   Social History  . Marital Status: Widowed    Spouse Name: N/A    Number of Children: N/A  . Years of Education: N/A   Occupational History  . Not on file.   Social History Main Topics  . Smoking status: Current Every Day Smoker -- 1.00 packs/day    Types: Cigarettes  . Smokeless tobacco: Never Used  . Alcohol Use: No  . Drug Use: No  . Sexual Activity: Not Currently   Other Topics Concern  . Not on file   Social History Narrative  . No narrative on file    Objective:  Filed Vitals:   06/06/14 1456  BP: 146/78  Pulse: 75  Temp: 97.7 F (36.5 C)  Resp: 16     General appearance: alert, cooperative and appears  stated age Ears: normal TM's and external ear canals both ears Throat: lips, mucosa, and tongue normal; teeth and gums normal Neck: no adenopathy, no carotid bruit, supple, symmetrical, trachea midline and thyroid not enlarged, symmetric, no tenderness/mass/nodules Back: symmetric, no curvature. ROM normal. No CVA tenderness. Lungs: clear to auscultation bilaterally Heart: regular rate and rhythm, S1, S2 normal, no murmur, click, rub or gallop Abdomen: soft, non-tender; bowel sounds normal; no masses,  no organomegaly Pulses: 2+ and symmetric Skin: Skin color, texture, turgor normal. No rashes or lesions Lymph nodes: Cervical, supraclavicular, and axillary nodes normal.  Assessment and  Plan:  DM type 2 (diabetes mellitus, type 2) Diagnosed with fasting glucose of 129.  Given her a1c she does not need medication but has been advised to quit smoking and start exercising.  Cholesterol is at goal.   Lab Results  Component Value Date   HGBA1C 6.2 06/06/2014   Lab Results  Component Value Date   CHOL 142 05/17/2014   HDL 42.70 05/17/2014   LDLCALC 80 05/17/2014   TRIG 95.0 05/17/2014   CHOLHDL 3 05/17/2014   Lab Results  Component Value Date   ALT 20 05/17/2014   AST 21 05/17/2014   ALKPHOS 64 05/17/2014   BILITOT 0.6 05/17/2014      Abdominal bloating Given her history of lifelong tobacco abuse, postmenopausal status  Will need to rule otu ovarian CA .  Ultrasound and a CA 125have been ordered  A total of 30 minutes of face to face time was spent with patient more than half of which was spent in counselling and coordination of care    Updated Medication List Outpatient Encounter Prescriptions as of 06/06/2014  Medication Sig  . ADVAIR DISKUS 100-50 MCG/DOSE AEPB ONE PUFF EVERY 12 HOURS AS DIRECTED. RINSE MOUTH AFTER USING  . Cholecalciferol (VITAMIN D3) 1000 UNITS CAPS Take 1 capsule by mouth daily.  . cyanocobalamin 100 MCG tablet Take 100 mcg by mouth daily.    . fluticasone (FLONASE) 50 MCG/ACT nasal spray Place 1 spray into both nostrils daily.  Marland Kitchen levothyroxine (SYNTHROID) 88 MCG tablet Take 1 tablet (88 mcg total) by mouth daily before breakfast.  . sertraline (ZOLOFT) 50 MG tablet Take 50 mg by mouth daily.    . simvastatin (ZOCOR) 20 MG tablet TAKE ONE TABLET AT BEDTIME  . nicotine (NICODERM CQ) 14 mg/24hr patch Place 1 patch (14 mg total) onto the skin daily.  . [DISCONTINUED] cephALEXin (KEFLEX) 500 MG capsule Take 1 capsule (500 mg total) by mouth 2 (two) times daily.  . [DISCONTINUED] ergocalciferol (DRISDOL) 50000 UNITS capsule Take 1 capsule (50,000 Units total) by mouth once a week.     Orders Placed This Encounter  Procedures  . US Transvaginal Non-OB  . CA  125  . Hemoglobin A1c    Return in about 3 months (around 09/06/2014).

## 2014-06-06 NOTE — Patient Instructions (Addendum)
Referral to dr Koleen Nimrod in process  Abdominal ultrasound and CA 127 to evalute your bloating  Low glycemic index diet to prevent need for diabetes medications  Please try to quit smoking !  Try the nicoderm patches   This is  my version of a  "Low GI"  Diet:  It will still lower your blood sugars and allow you to lose 4 to 8  lbs  per month if you follow it carefully.  Your goal with exercise is a minimum of 30 minutes of aerobic exercise 5 days per week (Walking does not count once it becomes easy!)      All of the foods can be found at grocery stores and in bulk at [employer].  The Atkins protein bars and shakes are available in more varieties at [employer], [employer] and [place].     7 AM Breakfast:  Choose from the following:  Low carbohydrate Protein  Shakes (I recommend the EAS AdvantEdge "Carb Control" shakes  Or the low carb shakes by Atkins.    2.5 carbs   Arnold's "Sandwhich Thin"toasted  w/ peanut butter (no jelly: about 20 net carbs  "Bagel Thin" with cream cheese and salmon: about 20 carbs   a scrambled egg/bacon/cheese burrito made with Mission's "carb balance" whole wheat tortilla  (about 10 net carbs )  A slice of home made fritatta (egg based dish without a crust:  google it)    Avoid cereal and bananas, oatmeal and cream of wheat and grits. They are loaded with carbohydrates!   10 AM: high protein snack  Protein bar by Atkins (the snack size, under 200 cal, usually < 6 net carbs).    A stick of cheese:  Around 1 carb,  100 cal     Dannon Light n Fit [place] Yogurt  (80 cal, 8 carbs)  Other so called "protein bars" and Greek yogurts tend to be loaded with carbohydrates.  Remember, in food advertising, the word "energy" is synonymous for " carbohydrate."  Lunch:   A Sandwich using the bread choices listed, Can use any  Eggs,  lunchmeat, grilled meat or canned tuna), avocado, regular mayo/mustard  and cheese.  A Salad using blue cheese, ranch,  Goddess or vinagrette,  No  croutons or "confetti" and no "candied nuts" but regular nuts OK.   No pretzels or chips.  Pickles and miniature sweet peppers are a good low carb alternative that provide a "crunch"  The bread is the only source of carbohydrate in a sandwich and  can be decreased by trying some of these alternatives to traditional loaf bread  Joseph's makes a pita bread and a flat bread that are 50 cal and 4 net carbs available at [place] and [place].  This can be toasted to use with hummous as well  Toufayan makes a low carb flatbread that's 100 cal and 9 net carbs available at [employer] and [employer]'s makes 2 sizes of  Low carb whole wheat tortilla  (The large one is 210 cal and 6 net carbs) Avoid "Low fat dressings, as well as Barry Brunner and [place] dressings They are loaded with sugar!   3 PM/ Mid day  Snack:  Consider  1 ounce of  almonds, walnuts, pistachios, pecans, peanuts,  Macadamia nuts or a nut medley.  Avoid "granola"; the dried cranberries and raisins are loaded with carbohydrates. Mixed nuts as long as there are no raisins,  cranberries or dried fruit.    Try the  prosciutto/mozzarella cheese sticks by [employer]  In deli /backery section   High protein   To avoid overindulging in snacks: Try drinking a glass of unsweeted almond/coconut milk  Or a cup of coffee with your Atkins chocolate bar t o keep you from having 3!!!        6 PM  Dinner:     Meat/fowl/fish with a green salad, and either broccoli, cauliflower, green beans, spinach, brussel sprouts or  Lima beans. DO NOT BREAD THE PROTEIN!!      There is a low carb pasta by Dreamfield's that is acceptable and tastes great: only 5 digestible carbs/serving.( All grocery stores but BJs carry it )  Try Hurley Cisco Angelo's chicken piccata or chicken or eggplant parm over low carb pasta.(Lowes and BJs)   Marjory Lies Sanchez's "Carnitas" (pulled pork, no sauce,  0 carbs) or his beef pot roast to make a dinner burrito (at [employer]J's)  Pesto over low carb pasta  (bj's sells a good quality pesto in the center refrigerated section of the deli   Try satueeing  Cheral Marker with mushroooms  Whole wheat pasta is still full of digestible carbs and  Not as low in glycemic index as Dreamfield's.   Brown rice is still rice,  So skip the rice and noodles if you eat [place] or [place] (or at least limit to 1/2 cup)  9 PM snack :   Breyer's "low carb" fudgsicle or  ice cream bar (Carb Smart line), or  Weight Watcher's ice cream bar , or another "no sugar added" ice cream;  a serving of fresh berries/cherries with whipped cream   Cheese or DANNON'S LlGHT N FIT [place] YOGURT or the Oikos greek yogurt   8 ounces of [place] unsweetened almond/cococunut milk    Avoid bananas, pineapple, grapes  and watermelon on a regular basis because they are high in sugar.  THINK OF THEM AS DESSERT  Remember that snack Substitutions should be less than 10 NET carbs per serving and meals < 20 carbs. Remember to subtract fiber grams to get the "net carbs."

## 2014-06-06 NOTE — Progress Notes (Signed)
Pre-visit discussion using our clinic review tool. No additional management support is needed unless otherwise documented below in the visit note.  

## 2014-06-07 LAB — HEMOGLOBIN A1C: Hgb A1c MFr Bld: 6.2 % (ref 4.6–6.5)

## 2014-06-07 LAB — CA 125: CA 125: 19 U/mL (ref <35)

## 2014-06-08 ENCOUNTER — Encounter: Payer: Self-pay | Admitting: Internal Medicine

## 2014-06-08 DIAGNOSIS — K5900 Constipation, unspecified: Secondary | ICD-10-CM | POA: Insufficient documentation

## 2014-06-08 NOTE — Assessment & Plan Note (Signed)
Smoking cessation instruction/counseling given:  counseled patient on the dangers of tobacco use, advised patient to stop smoking, and reviewed strategies to maximize success 

## 2014-06-08 NOTE — Assessment & Plan Note (Signed)
Given her history of lifelong tobacco abuse, postmenopausal status  Will need to rule otu ovarian CA .  Ultrasound and a CA 125have been ordered

## 2014-06-08 NOTE — Assessment & Plan Note (Addendum)
Diagnosed with fasting glucose of 129.  Given her a1c she does not need medication but has been advised to quit smoking and start exercising.  Cholesterol is at goal.   Lab Results  Component Value Date   HGBA1C 6.2 06/06/2014   Lab Results  Component Value Date   CHOL 142 05/17/2014   HDL 42.70 05/17/2014   LDLCALC 80 05/17/2014   TRIG 95.0 05/17/2014   CHOLHDL 3 05/17/2014   Lab Results  Component Value Date   ALT 20 05/17/2014   AST 21 05/17/2014   ALKPHOS 64 05/17/2014   BILITOT 0.6 05/17/2014

## 2014-06-16 ENCOUNTER — Ambulatory Visit: Payer: Self-pay | Admitting: Internal Medicine

## 2014-06-22 ENCOUNTER — Telehealth: Payer: Self-pay | Admitting: Internal Medicine

## 2014-06-22 DIAGNOSIS — R140 Abdominal distension (gaseous): Secondary | ICD-10-CM

## 2014-06-22 NOTE — Telephone Encounter (Signed)
Pt notified and verbalized understanding.

## 2014-06-22 NOTE — Telephone Encounter (Signed)
Left message for pt to return my call.

## 2014-06-22 NOTE — Telephone Encounter (Signed)
Her blood test and ultrasound were normal.  Nothing to suggest ovarian cancer. Symptoms may be coming from diet. Try taking Beano before any meal with vegetables and if constipation is the issue,  Try daily metamucil .

## 2014-06-22 NOTE — Assessment & Plan Note (Signed)
Her blood test and ultrasound were normal.  Nothing to suggest ovarian cancer. Symptoms may be coming from diet. Try taking Beano before any meal with vegetables and if constipation is the issue,  Try daily metamucil .

## 2014-06-28 ENCOUNTER — Telehealth: Payer: Self-pay | Admitting: *Deleted

## 2014-06-28 NOTE — Telephone Encounter (Signed)
VM left for pt about nurse visit BP recheck for DB 

## 2014-07-06 ENCOUNTER — Encounter: Payer: Self-pay | Admitting: Internal Medicine

## 2014-09-04 ENCOUNTER — Ambulatory Visit: Payer: Self-pay | Admitting: Internal Medicine

## 2014-09-04 LAB — HM MAMMOGRAPHY: HM Mammogram: NEGATIVE

## 2014-09-08 ENCOUNTER — Other Ambulatory Visit: Payer: Self-pay | Admitting: Internal Medicine

## 2014-09-11 ENCOUNTER — Encounter: Payer: Self-pay | Admitting: *Deleted

## 2014-09-20 ENCOUNTER — Encounter: Payer: Self-pay | Admitting: Internal Medicine

## 2014-09-28 ENCOUNTER — Ambulatory Visit: Payer: Self-pay | Admitting: Vascular Surgery

## 2014-10-09 ENCOUNTER — Telehealth: Payer: Self-pay | Admitting: *Deleted

## 2014-10-09 ENCOUNTER — Other Ambulatory Visit: Payer: Self-pay | Admitting: Internal Medicine

## 2014-10-09 MED ORDER — CLOBETASOL PROPIONATE 0.05 % EX CREA: 1.0000 "application " | TOPICAL_CREAM | Freq: Two times a day (BID) | CUTANEOUS | Status: DC

## 2014-10-09 NOTE — Telephone Encounter (Signed)
Refill request, states her dermatologist has retired. Requesting Clobetasol prop 0.05% topical cream #60 g apply to affected area 1-2 times daily. Please advise

## 2014-10-09 NOTE — Telephone Encounter (Signed)
Ok to refill.  i'll create it and send it to pharmacy

## 2014-10-13 HISTORY — PX: CAROTID ENDARTERECTOMY: SUR193

## 2014-10-24 ENCOUNTER — Ambulatory Visit: Payer: Self-pay | Admitting: Vascular Surgery

## 2014-10-24 LAB — CBC
HCT: 35.3 % (ref 35.0–47.0)
HGB: 11.5 g/dL — ABNORMAL LOW (ref 12.0–16.0)
MCH: 30.7 pg (ref 26.0–34.0)
MCHC: 32.6 g/dL (ref 32.0–36.0)
MCV: 94 fL (ref 80–100)
Platelet: 235 10*3/uL (ref 150–440)
RBC: 3.74 10*6/uL — ABNORMAL LOW (ref 3.80–5.20)
RDW: 13.3 % (ref 11.5–14.5)
WBC: 8 10*3/uL (ref 3.6–11.0)

## 2014-10-24 LAB — BASIC METABOLIC PANEL
ANION GAP: 6 — AB (ref 7–16)
BUN: 11 mg/dL (ref 7–18)
CO2: 27 mmol/L (ref 21–32)
Calcium, Total: 8.6 mg/dL (ref 8.5–10.1)
Chloride: 106 mmol/L (ref 98–107)
Creatinine: 0.93 mg/dL (ref 0.60–1.30)
EGFR (African American): >60
EGFR (Non-African Amer.): >60
Glucose: 102 mg/dL — ABNORMAL HIGH (ref 65–99)
Osmolality: 277 (ref 275–301)
POTASSIUM: 4 mmol/L (ref 3.5–5.1)
Sodium: 139 mmol/L (ref 136–145)

## 2014-11-02 ENCOUNTER — Inpatient Hospital Stay: Payer: Self-pay | Admitting: Vascular Surgery

## 2014-11-03 LAB — PROTIME-INR
INR: 1
PROTHROMBIN TIME: 13.5 s (ref 11.5–14.7)

## 2014-11-03 LAB — BASIC METABOLIC PANEL
Anion Gap: 6 — ABNORMAL LOW (ref 7–16)
BUN: 19 mg/dL — ABNORMAL HIGH (ref 7–18)
CALCIUM: 8.4 mg/dL — AB (ref 8.5–10.1)
CO2: 24 mmol/L (ref 21–32)
Chloride: 102 mmol/L (ref 98–107)
Creatinine: 0.8 mg/dL (ref 0.60–1.30)
EGFR (African American): >60
EGFR (Non-African Amer.): >60
GLUCOSE: 134 mg/dL — AB (ref 65–99)
Osmolality: 269 (ref 275–301)
POTASSIUM: 4.6 mmol/L (ref 3.5–5.1)
SODIUM: 132 mmol/L — AB (ref 136–145)

## 2014-11-03 LAB — CBC WITH DIFFERENTIAL/PLATELET
Basophil #: 0 10*3/uL (ref 0.0–0.1)
Basophil %: 0.1 %
Eosinophil #: 0 10*3/uL (ref 0.0–0.7)
Eosinophil %: 0 %
HCT: 34.7 % — AB (ref 35.0–47.0)
HGB: 11.3 g/dL — ABNORMAL LOW (ref 12.0–16.0)
Lymphocyte #: 0.9 10*3/uL — ABNORMAL LOW (ref 1.0–3.6)
Lymphocyte %: 4.6 %
MCH: 30.7 pg (ref 26.0–34.0)
MCHC: 32.6 g/dL (ref 32.0–36.0)
MCV: 94 fL (ref 80–100)
MONO ABS: 0.9 x10 3/mm (ref 0.2–0.9)
Monocyte %: 4.7 %
NEUTROS ABS: 16.9 10*3/uL — AB (ref 1.4–6.5)
Neutrophil %: 90.6 %
PLATELETS: 259 10*3/uL (ref 150–440)
RBC: 3.69 10*6/uL — ABNORMAL LOW (ref 3.80–5.20)
RDW: 13.4 % (ref 11.5–14.5)
WBC: 18.7 10*3/uL — ABNORMAL HIGH (ref 3.6–11.0)

## 2014-11-03 LAB — APTT: Activated PTT: 29.5 secs (ref 23.6–35.9)

## 2014-11-09 ENCOUNTER — Other Ambulatory Visit: Payer: Self-pay | Admitting: Internal Medicine

## 2014-11-13 ENCOUNTER — Encounter: Payer: Self-pay | Admitting: Internal Medicine

## 2014-12-12 ENCOUNTER — Ambulatory Visit (INDEPENDENT_AMBULATORY_CARE_PROVIDER_SITE_OTHER): Payer: PPO | Admitting: Internal Medicine

## 2014-12-12 ENCOUNTER — Encounter: Payer: Self-pay | Admitting: Internal Medicine

## 2014-12-12 VITALS — BP 132/68 | HR 71 | Temp 98.5°F | Resp 16 | Ht 64.0 in | Wt 168.2 lb

## 2014-12-12 DIAGNOSIS — Z8679 Personal history of other diseases of the circulatory system: Secondary | ICD-10-CM

## 2014-12-12 DIAGNOSIS — Z716 Tobacco abuse counseling: Secondary | ICD-10-CM

## 2014-12-12 DIAGNOSIS — E785 Hyperlipidemia, unspecified: Secondary | ICD-10-CM

## 2014-12-12 DIAGNOSIS — Z720 Tobacco use: Secondary | ICD-10-CM

## 2014-12-12 DIAGNOSIS — E038 Other specified hypothyroidism: Secondary | ICD-10-CM

## 2014-12-12 DIAGNOSIS — E119 Type 2 diabetes mellitus without complications: Secondary | ICD-10-CM

## 2014-12-12 DIAGNOSIS — E034 Atrophy of thyroid (acquired): Secondary | ICD-10-CM

## 2014-12-12 LAB — HM DIABETES FOOT EXAM: HM Diabetic Foot Exam: NORMAL

## 2014-12-12 NOTE — Progress Notes (Signed)
Patient ID: Jane Davidson, female   DOB: Mar 29, 1941, 74 y.o.   MRN: [id number] Patient Active Problem List   Diagnosis Date Noted  . Abdominal bloating 06/08/2014  . DM type 2 (diabetes mellitus, type 2) 05/17/2014  . Cellulitis of leg, left 09/27/2013  . Anemia 09/27/2013  . Routine general medical examination at a health care facility 03/27/2013  . COPD exacerbation 01/02/2013  . Screening for colon cancer 10/16/2011  . Tobacco abuse counseling 08/09/2011  . History of peripheral vascular disease   . Bursitis, ischial   . Osteopenia   . Tobacco abuse disorder   . Vitamin D deficiency   . Arrhythmia 07/05/2011  . Hypothyroidism 07/05/2011  . 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 COPD (chronic obstructive pulmonary disease)   . Hyperlipidemia   . Screening for breast cancer 02/11/2011    Subjective:  CC:   Chief Complaint  Patient presents with  . Follow-up    Thyroid,  . Diabetes    Carotid surgery in 11/03/14 on right side.    HPI:   Jane Davidson is a 74 y.o. female who presents for  3 month follow up on Type 2 DM, recent diagnosis,  Hypertension,and  hyperlipidemia .  She has been   ireducing the sugar in her diet. She feels generally well, is not exercising  regularly  week except walking at the mall 2 or 3 times per week and not checking her blood sugars once daily at variable times.  BS have been under 130 fasting and < 150 post prandially.  Denies any recent hypoglyemic events.  Taking her  medications as directed. Following a carbohydrate modified diet 6 days per week. Denies numbness, burning and tingling of extremities. Appetite is good.     Right carotid surgery  Recently by [facility], no complications.     Past Medical History  Diagnosis Date  . Hyperlipidemia   . Bursitis NEC   . Osteopenia 2009     T scores - 1.5 DEXA 2009  . Tobacco abuse disorder   . COPD (chronic obstructive pulmonary disease)   . hypothyroid   .  Vitamin D deficiency April 2011    replaced withDrsido for level of 11.3 ng/ml  . Vitamin D deficiency April 2011    replaced withDrsido for level of 11.3 ng/ml  . Screening for breast cancer May 2012    [facility] mammogram  . Abnormal Pap smear of cervix 2001    Biopsy normal  . History of peripheral vascular disease 2001    s/p bilateral aorto-iliac-femoral bypass /[facility]    Past Surgical History  Procedure Laterality Date  . Hernia repair  Jun 2011    [name]   . Cataract extraction  Sept 2011  . Cholecystectomy    . Tubal ligation         The following portions of the patient's history were reviewed and updated as appropriate: Allergies, current medications, and problem list.    Review of Systems:   Patient denies headache, fevers, malaise, unintentional weight loss, skin rash, eye pain, sinus congestion and sinus pain, sore throat, dysphagia,  hemoptysis , cough, dyspnea, wheezing, chest pain, palpitations, orthopnea, edema, abdominal pain, nausea, melena, diarrhea, constipation, flank pain, dysuria, hematuria, urinary  Frequency, nocturia, numbness, tingling, seizures,  Focal weakness, Loss of consciousness,  Tremor, insomnia, depression, anxiety, and suicidal ideation.     History   Social History  . Marital Status: Married    Spouse  Name: N/A  . Number of Children: N/A  . Years of Education: N/A   Occupational History  . Not on file.   Social History Main Topics  . Smoking status: Current Every Day Smoker -- 1.00 packs/day    Types: Cigarettes  . Smokeless tobacco: Never Used  . Alcohol Use: No  . Drug Use: No  . Sexual Activity: Not Currently   Other Topics Concern  . Not on file   Social History Narrative    Objective:  Filed Vitals:   12/12/14 1514  BP: 132/68  Pulse: 71  Temp: 98.5 F (36.9 C)  Resp: 16     General appearance: alert, cooperative and appears stated age Ears: normal TM's and external ear canals both ears Throat: lips,  mucosa, and tongue normal; teeth and gums normal Neck: no adenopathy, no carotid bruit, supple, symmetrical, trachea midline and thyroid not enlarged, symmetric, no tenderness/mass/nodules Back: symmetric, no curvature. ROM normal. No CVA tenderness. Lungs: clear to auscultation bilaterally Heart: regular rate and rhythm, S1, S2 normal, no murmur, click, rub or gallop Abdomen: soft, non-tender; bowel sounds normal; no masses,  no organomegaly Pulses: 2+ and symmetric Skin: Skin color, texture, turgor normal. No rashes or lesions Lymph nodes: Cervical, supraclavicular, and axillary nodes normal.  Assessment and Plan:  Problem List Items Addressed This Visit    Tobacco abuse disorder    She continues to smoke despite the recent finding of carotid artery stenosis and recent diagnosis of DM Type 2.  counselling given.       Tobacco abuse counseling    Smoking cessation instruction/counseling given:  counseled patient on the dangers of tobacco use, advised patient to stop smoking, and reviewed strategies to maximize success        Hypothyroidism    Thyroid function is WNL on current dose.  No current changes needed.   Lab Results  Component Value Date   TSH 1.76 12/12/2014         Relevant Orders   TSH (Completed)   Hyperlipidemia    Well controlled on current medications .  :Liver enzymes are normal.  No changes today  Lab Results  Component Value Date   CHOL 136 12/12/2014   HDL 44.90 12/12/2014   LDLCALC 61 12/12/2014   TRIG 153.0* 12/12/2014   CHOLHDL 3 12/12/2014   Lab Results  Component Value Date   ALT 11 12/12/2014   AST 16 12/12/2014   ALKPHOS 63 12/12/2014   BILITOT 0.3 12/12/2014           Relevant Orders   Lipid panel (Completed)   History of peripheral vascular disease    S/p carotid endarterectomy ,  Right side. By [facility] January 2015      DM type 2 (diabetes mellitus, type 2) - Primary    Diagnosed with fasting glucose of 129.  Given her  a1c she does not need medication but has been advised to quit smoking and start exercising.  Cholesterol is at goal.   Lab Results  Component Value Date   HGBA1C 6.2 12/12/2014   Lab Results  Component Value Date   CHOL 136 12/12/2014   HDL 44.90 12/12/2014   LDLCALC 61 12/12/2014   TRIG 153.0* 12/12/2014   CHOLHDL 3 12/12/2014   Lab Results  Component Value Date   ALT 11 12/12/2014   AST 16 12/12/2014   ALKPHOS 63 12/12/2014   BILITOT 0.3 12/12/2014            Relevant  Orders   Comprehensive metabolic panel (Completed)   Hemoglobin A1c (Completed)   Microalbumin / creatinine urine ratio (Completed)

## 2014-12-12 NOTE — Progress Notes (Signed)
Pre-visit discussion using our clinic review tool. No additional management support is needed unless otherwise documented below in the visit note.  

## 2014-12-12 NOTE — Patient Instructions (Signed)
Diabetes and Standards of Medical Care Diabetes is complicated. You may find that your diabetes team includes a dietitian, nurse, diabetes educator, eye doctor, and more. To help everyone know what is going on and to help you get the care you deserve, the following schedule of care was developed to help keep you on track. Below are the tests, exams, vaccines, medicines, education, and plans you will need. HbA1c test This test shows how well you have controlled your glucose over the past 2-3 months. It is used to see if your diabetes management plan needs to be adjusted.   It is performed at least 2 times a year if you are meeting treatment goals.  It is performed 4 times a year if therapy has changed or if you are not meeting treatment goals. Blood pressure test  This test is performed at every routine medical visit. The goal is less than 140/90 mm Hg for most people, but 130/80 mm Hg in some cases. Ask your health care provider about your goal. Dental exam  Follow up with the dentist regularly. Eye exam  If you are diagnosed with type 1 diabetes as a child, get an exam upon reaching the age of 37 years or older and have had diabetes for 3-5 years. Yearly eye exams are recommended after that initial eye exam.  If you are diagnosed with type 1 diabetes as an adult, get an exam within 5 years of diagnosis and then yearly.  If you are diagnosed with type 2 diabetes, get an exam as soon as possible after the diagnosis and then yearly. Foot care exam  Visual foot exams are performed at every routine medical visit. The exams check for cuts, injuries, or other problems with the feet.  A comprehensive foot exam should be done yearly. This includes visual inspection as well as assessing foot pulses and testing for loss of sensation.  Check your feet nightly for cuts, injuries, or other problems with your feet. Tell your health care provider if anything is not healing. Kidney function test (urine  microalbumin)  This test is performed once a year.  Type 1 diabetes: The first test is performed 5 years after diagnosis.  Type 2 diabetes: The first test is performed at the time of diagnosis.  A serum creatinine and estimated glomerular filtration rate (eGFR) test is done once a year to assess the level of chronic kidney disease (CKD), if present. Lipid profile (cholesterol, HDL, LDL, triglycerides)  Performed every 5 years for most people.  The goal for LDL is less than 100 mg/dL. If you are at high risk, the goal is less than 70 mg/dL.  The goal for HDL is 40 mg/dL-50 mg/dL for men and 50 mg/dL-60 mg/dL for women. An HDL cholesterol of 60 mg/dL or higher gives some protection against heart disease.  The goal for triglycerides is less than 150 mg/dL. Influenza vaccine, pneumococcal vaccine, and hepatitis B vaccine  The influenza vaccine is recommended yearly.  It is recommended that people with diabetes who are over 24 years old get the pneumonia vaccine. In some cases, two separate shots may be given. Ask your health care provider if your pneumonia vaccination is up to date.  The hepatitis B vaccine is also recommended for adults with diabetes. Diabetes self-management education  Education is recommended at diagnosis and ongoing as needed. Treatment plan  Your treatment plan is reviewed at every medical visit. Document Released: 07/27/2009 Document Revised: 02/13/2014 Document Reviewed: 03/01/2013 [facility] Patient Information 2015 [place],  LL[employer]. This information is not intended to replace advice given to you by your health care provider. Make sure you discuss any questions you have with your health care provider.  

## 2014-12-13 LAB — LIPID PANEL
CHOLESTEROL: 136 mg/dL (ref 0–200)
HDL: 44.9 mg/dL (ref >39.00)
LDL Cholesterol: 61 mg/dL (ref 0–99)
NONHDL: 91.1
Total CHOL/HDL Ratio: 3
Triglycerides: 153 mg/dL — ABNORMAL HIGH (ref 0.0–149.0)
VLDL: 30.6 mg/dL (ref 0.0–40.0)

## 2014-12-13 LAB — COMPREHENSIVE METABOLIC PANEL
ALT: 11 U/L (ref 0–35)
AST: 16 U/L (ref 0–37)
Albumin: 4.1 g/dL (ref 3.5–5.2)
Alkaline Phosphatase: 63 U/L (ref 39–117)
BILIRUBIN TOTAL: 0.3 mg/dL (ref 0.2–1.2)
BUN: 10 mg/dL (ref 6–23)
CALCIUM: 9.1 mg/dL (ref 8.4–10.5)
CO2: 28 mEq/L (ref 19–32)
Chloride: 103 mEq/L (ref 96–112)
Creatinine, Ser: 0.81 mg/dL (ref 0.40–1.20)
GFR: 73.83 mL/min (ref >60.00)
GLUCOSE: 109 mg/dL — AB (ref 70–99)
POTASSIUM: 4.4 meq/L (ref 3.5–5.1)
SODIUM: 134 meq/L — AB (ref 135–145)
Total Protein: 6.8 g/dL (ref 6.0–8.3)

## 2014-12-13 LAB — MICROALBUMIN / CREATININE URINE RATIO
CREATININE, U: 56.5 mg/dL
MICROALB/CREAT RATIO: 1.2 mg/g (ref 0.0–30.0)
Microalb, Ur: <0.7 mg/dL (ref 0.0–1.9)

## 2014-12-13 LAB — HEMOGLOBIN A1C: HEMOGLOBIN A1C: 6.2 % (ref 4.6–6.5)

## 2014-12-13 LAB — TSH: TSH: 1.76 u[IU]/mL (ref 0.35–4.50)

## 2014-12-14 ENCOUNTER — Encounter: Payer: Self-pay | Admitting: Internal Medicine

## 2014-12-14 NOTE — Assessment & Plan Note (Signed)
Diagnosed with fasting glucose of 129.  Given her a1c she does not need medication but has been advised to quit smoking and start exercising.  Cholesterol is at goal.   Lab Results  Component Value Date   HGBA1C 6.2 12/12/2014   Lab Results  Component Value Date   CHOL 136 12/12/2014   HDL 44.90 12/12/2014   LDLCALC 61 12/12/2014   TRIG 153.0* 12/12/2014   CHOLHDL 3 12/12/2014   Lab Results  Component Value Date   ALT 11 12/12/2014   AST 16 12/12/2014   ALKPHOS 63 12/12/2014   BILITOT 0.3 12/12/2014

## 2014-12-14 NOTE — Assessment & Plan Note (Signed)
She continues to smoke despite the recent finding of carotid artery stenosis and recent diagnosis of DM Type 2.  counselling given.

## 2014-12-14 NOTE — Assessment & Plan Note (Signed)
Smoking cessation instruction/counseling given:  counseled patient on the dangers of tobacco use, advised patient to stop smoking, and reviewed strategies to maximize success 

## 2014-12-14 NOTE — Assessment & Plan Note (Signed)
Well controlled on current medications .  :Liver enzymes are normal.  No changes today  Lab Results  Component Value Date   CHOL 136 12/12/2014   HDL 44.90 12/12/2014   LDLCALC 61 12/12/2014   TRIG 153.0* 12/12/2014   CHOLHDL 3 12/12/2014   Lab Results  Component Value Date   ALT 11 12/12/2014   AST 16 12/12/2014   ALKPHOS 63 12/12/2014   BILITOT 0.3 12/12/2014

## 2014-12-14 NOTE — Assessment & Plan Note (Signed)
Thyroid function is WNL on current dose.  No current changes needed.   Lab Results  Component Value Date   TSH 1.76 12/12/2014

## 2014-12-14 NOTE — Assessment & Plan Note (Signed)
S/p carotid endarterectomy ,  Right side. By [facility] January 2015

## 2014-12-15 ENCOUNTER — Encounter: Payer: Self-pay | Admitting: *Deleted

## 2015-01-23 ENCOUNTER — Other Ambulatory Visit: Payer: Self-pay | Admitting: *Deleted

## 2015-01-23 MED ORDER — FLUTICASONE PROPIONATE 50 MCG/ACT NA SUSP: 2.0000 | Freq: Every day | NASAL | Status: DC

## 2015-01-23 NOTE — Progress Notes (Signed)
Patient called and requested refill on Flonase  Refill sent electronically.

## 2015-02-04 NOTE — Op Note (Signed)
PATIENT NAME:  Jane Davidson, Jane Davidson MR#:  [id number] DATE OF BIRTH:  08/29/1941  DATE OF PROCEDURE:  04/06/2012  PREOPERATIVE DIAGNOSIS: Visually significant cataract of the right eye.   POSTOPERATIVE DIAGNOSIS: Visually significant cataract of the right eye.   OPERATIVE PROCEDURE: Cataract extraction by phacoemulsification with implant of intraocular lens to right eye.   SURGEON: Birder Robson, MD.   ANESTHESIA:  1. Managed anesthesia care.  2. Topical tetracaine drops followed by 2% Xylocaine jelly applied in the preoperative holding area.   COMPLICATIONS: None.   TECHNIQUE:  Stop and chop.   DESCRIPTION OF PROCEDURE: The patient was examined and consented in the preoperative holding area where the aforementioned topical anesthesia was applied to the right eye and then brought back to the Operating Room where the right eye was prepped and draped in the usual sterile ophthalmic fashion and a lid speculum was placed. A paracentesis was created with the side port blade and the anterior chamber was filled with viscoelastic. A near clear corneal incision was performed with the steel keratome. A continuous curvilinear capsulorrhexis was performed with a cystotome followed by the capsulorrhexis forceps. Hydrodissection and hydrodelineation were carried out with BSS on a blunt cannula. The lens was removed in a stop and chop technique and the remaining cortical material was removed with the irrigation-aspiration handpiece. The capsular bag was inflated with viscoelastic and the Alcon SN60WF 21.0-diopter lens, serial number [id number] was placed in the capsular bag without complication. The remaining viscoelastic was removed from the eye with the irrigation-aspiration handpiece. The wounds were hydrated. The anterior chamber was flushed with Miostat and the eye was inflated to physiologic pressure. The wounds were found to be water tight. The eye was dressed with Vigamox. The patient was given  protective glasses to wear throughout the day and a shield with which to sleep tonight. The patient was also given drops with which to begin a drop regimen today and will follow-up with me in one day.  ____________________________ Livingston Diones. Yalena Colon, MD wlp:slb D: 04/06/2012 11:20:59 ET T: 04/06/2012 11:34:58 ET JOB#: [id number]  cc: Exodus Kutzer L. Viva Gallaher, MD, <Dictator> Livingston Diones Ezekial Arns MD ELECTRONICALLY SIGNED 04/07/2012 15:07

## 2015-02-05 LAB — SURGICAL PATHOLOGY

## 2015-02-11 NOTE — Op Note (Signed)
PATIENT NAME:  Jane Davidson, Jane Davidson MR#:  [id number] DATE OF BIRTH:  01-14-1941  DATE OF PROCEDURE:  11/02/2014  PREOPERATIVE DIAGNOSES:  1.  High-grade right carotid artery stenosis.  2.  Hyperlipidemia.  3.  Hypothyroidism.  4.  Mild left carotid artery stenosis.   POSTOPERATIVE DIAGNOSES:   1.  High-grade right carotid artery stenosis.  2.  Hyperlipidemia.  3.  Hypothyroidism.  4.  Mild left carotid artery stenosis.   PROCEDURES:  Right carotid endarterectomy with CorMatrix arterial patch reconstruction.   SURGEON: Algernon Huxley, MD   ANESTHESIA: General.   BLOOD LOSS: 50 mL.   INDICATION FOR PROCEDURE: This is a 74 year old female who we saw in the office and was found to have about a 75% to 80% right internal carotid artery stenosis, which was heavily calcified.  Given her overall good functional status and high grade stenosis, carotid endarterectomy was offered for stroke risk reduction. Risks and benefits were discussed. Informed consent was obtained.   DESCRIPTION OF PROCEDURE: The patient is brought to the operative suite. After an adequate level of general anesthesia was obtained, the right neck and chest were sterilely prepped and draped, and a sterile surgical field was created. The patient was placed in a modified beach chair position. A roll was placed under shoulder and was flexed and turned to the left. After appropriate surgical timeout and intravenous antibiotics, an incision was created along the anterior border of the sternocleidomastoid and we dissected through the platysma with electrocautery. I then used 2 [place] to retract to facilitate our exposure.  The sternocleidomastoid was retracted laterally, dissected away.  The facial vein was ligated and divided between silk ties, exposing the carotid bifurcation.  She had a low-lying carotid bifurcation and we were easily able to encircle the internal carotid artery distal to the lesion, the common carotid artery proximal to  the lesion, the external carotid artery and superior thyroid artery. The patient was given 6000 units of intravenous heparin. This was allowed to circulate for approximately 4 to 5 minutes. Control was then pulled up on the vessel loops and an anterior wall arteriotomy was created with an 11 blade and extended with Potts scissors. The Pruitt-[place] shunt was placed first in the internal carotid artery, flushed and de-aired, then the common carotid artery and flow restored. Approximately 1 minute elapsed from clamping to restoration of flow with the shunt.   I then performed an endarterectomy in the typical fashion.  The proximal endpoint was cut flush with tenotomy scissors. An eversion endarterectomy was performed on the external carotid artery and a nice feathered distal endpoint was created with gentle traction on the internal carotid artery.  The distal endpoint was tacked down with two 7-0 Prolene sutures. All loose flecks were removed. The vessel was locally heparinized.  The CorMatrix extracellular patch was then brought onto the field. It was started at the distal endpoint, cut and beveled and run approximately one-half the length of the arteriotomy medially and laterally.  I then cut and beveled the patch in the appropriate length to match the arteriotomy, and started a second 6-0 Prolene at the proximal endpoint.  The medial suture line was run together and tied. The lateral suture line was run approximately one-quarter length of the arteriotomy. The shunt was then removed.  The vessel was flushed in the internal, common and external carotid arteries and locally heparinized. The suture line was then completed, flushing several cardiac cycles out the external carotid artery prior to releasing control.  On release, two 6-0 Prolene patch sutures were used for hemostasis and hemostasis was achieved. Wound was then irrigated. Surgicel and Evicel topical hemostatic agents were placed and hemostasis was  complete. The wound was then closed with 3 interrupted 3-0 Vicryl sutures in the sternocleidomastoid space. The platysma was closed with a running 3-0 Vicryl and the skin was closed with a 4-0 Monocryl and Dermabond was placed as a dressing. The patient was awakened from anesthesia and taken to the recovery room in stable condition, having tolerated the procedure well.   please send a copy [name], the patient's primary care physician thank you   ____________________________ Algernon Huxley, MD jsd:DT D: 11/02/2014 14:06:02 ET T: 11/02/2014 14:37:41 ET JOB#: [id number]  cc: Algernon Huxley, MD, <Dictator> [name], MD  Algernon Huxley MD ELECTRONICALLY SIGNED 11/15/2014 11:52

## 2015-02-26 ENCOUNTER — Ambulatory Visit (INDEPENDENT_AMBULATORY_CARE_PROVIDER_SITE_OTHER): Payer: PPO | Admitting: Nurse Practitioner

## 2015-02-26 ENCOUNTER — Encounter: Payer: Self-pay | Admitting: Nurse Practitioner

## 2015-02-26 VITALS — BP 160/80 | HR 73 | Temp 98.3°F | Resp 14 | Ht 64.0 in | Wt 167.0 lb

## 2015-02-26 DIAGNOSIS — B9789 Other viral agents as the cause of diseases classified elsewhere: Principal | ICD-10-CM

## 2015-02-26 DIAGNOSIS — J069 Acute upper respiratory infection, unspecified: Secondary | ICD-10-CM

## 2015-02-26 MED ORDER — AZITHROMYCIN 250 MG PO TABS: ORAL_TABLET | ORAL | Status: DC

## 2015-02-26 NOTE — Progress Notes (Signed)
Pre visit review using our clinic review tool, if applicable. No additional management support is needed unless otherwise documented below in the visit note. 

## 2015-02-26 NOTE — Patient Instructions (Signed)
Try the Z-pack. Take with probiotic.  Call us if not helpful after finishing.

## 2015-02-26 NOTE — Progress Notes (Signed)
   Subjective:    Patient ID: Jane Davidson, female    DOB: Sep 08, 1941, 74 y.o.   MRN: [id number]  HPI  Ms. Prindle is a 74 yo female with a CC of non productive cough x 2 weeks.   1) Non-productive cough x 2 weeks, using Advair, flonase, and robitussin. Still smoking 0.5 ppd and using lozenges for throat.   Review of Systems  Constitutional: Negative for fever, chills, diaphoresis and fatigue.  HENT: Positive for congestion, postnasal drip, rhinorrhea and sneezing. Negative for ear discharge, ear pain and sore throat.   Eyes: Negative for discharge and itching.  Respiratory: Positive for cough and wheezing. Negative for chest tightness and shortness of breath.   Cardiovascular: Negative for chest pain, palpitations and leg swelling.  Gastrointestinal: Positive for nausea. Negative for vomiting and diarrhea.  Skin: Negative for rash.  Neurological: Negative for dizziness and headaches.      Objective:   Physical Exam  Constitutional: She is oriented to person, place, and time. She appears well-developed and well-nourished. No distress.  BP 160/80 mmHg  Pulse 73  Temp(Src) 98.3 F (36.8 C) (Oral)  Resp 14  Ht 5\' 4"  (1.626 m)  Wt 167 lb (75.751 kg)  BMI 28.65 kg/m2  SpO2 96%   HENT:  Head: Normocephalic and atraumatic.  Right Ear: External ear normal.  Left Ear: External ear normal.  Mouth/Throat: No oropharyngeal exudate.  Eyes: EOM are normal. Pupils are equal, round, and reactive to light. Right eye exhibits no discharge. Left eye exhibits no discharge. No scleral icterus.  Neck: Normal range of motion. Neck supple. No thyromegaly present.  Cardiovascular: Normal rate, regular rhythm, normal heart sounds and intact distal pulses.  Exam reveals no gallop and no friction rub.   No murmur heard. Pulmonary/Chest: Effort normal and breath sounds normal. No respiratory distress. She has no wheezes. She has no rales. She exhibits no tenderness.  Lymphadenopathy:    She has  no cervical adenopathy.  Neurological: She is alert and oriented to person, place, and time. No cranial nerve deficit. She exhibits normal muscle tone. Coordination normal.  Skin: Skin is warm and dry. No rash noted. She is not diaphoretic.  Psychiatric: She has a normal mood and affect. Her behavior is normal. Judgment and thought content normal.      Assessment & Plan:  Viral URI w/ cough  1) Probable viral URI, 2 weeks not improving. Has COPD worried about bacterial sequelae  2) Gave Z-pack in case of worsening 3) Encouraged probiotics 4) Continue OTC measures 5) RTC if worsening/not improving after Z-pack

## 2015-03-29 ENCOUNTER — Telehealth: Payer: Self-pay | Admitting: Internal Medicine

## 2015-03-29 NOTE — Telephone Encounter (Signed)
Patient Name: Jane Davidson DOB: [birth year] Initial Comment Caller states, has aches in Rt leg ; wants an appt Nurse Assessment Nurse: Marcelline Deist, RN, Kermit Balo Date/Time (Eastern Time): 03/29/2015 11:50:45 AM Confirm and document reason for call. If symptomatic, describe symptoms. ---Caller states she has had aches in upper part of right leg/above knee. She wants an appt No visible symptoms. No fever. She can walk, but it is uncomfortable. Has the patient traveled out of the country within the last 30 days? ---Not Applicable Does the patient require triage? ---Yes Related visit to physician within the last 2 weeks? ---No Does the PT have any chronic conditions? (i.e. diabetes, asthma, etc.) ---Yes List chronic conditions. ---thyroid, carotid artery blockage, skin cancer Guidelines Guideline Title Affirmed Question Affirmed Notes Leg Pain [1] MODERATE pain (e.g., interferes with normal activities, limping) AND [2] present > 3 days Final Disposition User See PCP When Office is Open (within 3 days) Marcelline Deist, Therapist, sports, [employer]

## 2015-03-30 ENCOUNTER — Ambulatory Visit
Admission: RE | Admit: 2015-03-30 | Discharge: 2015-03-30 | Disposition: A | Discharge status: Home / Self Care | Payer: PPO | Source: Ambulatory Visit | Attending: Internal Medicine | Admitting: Internal Medicine

## 2015-03-30 ENCOUNTER — Encounter: Payer: Self-pay | Admitting: Internal Medicine

## 2015-03-30 ENCOUNTER — Ambulatory Visit (INDEPENDENT_AMBULATORY_CARE_PROVIDER_SITE_OTHER): Payer: PPO | Admitting: Internal Medicine

## 2015-03-30 VITALS — BP 148/72 | HR 80 | Temp 98.2°F | Resp 16 | Ht 65.0 in | Wt 164.5 lb

## 2015-03-30 DIAGNOSIS — E119 Type 2 diabetes mellitus without complications: Secondary | ICD-10-CM | POA: Diagnosis not present

## 2015-03-30 DIAGNOSIS — R102 Pelvic and perineal pain: Secondary | ICD-10-CM | POA: Diagnosis present

## 2015-03-30 DIAGNOSIS — M25551 Pain in right hip: Secondary | ICD-10-CM

## 2015-03-30 DIAGNOSIS — B351 Tinea unguium: Secondary | ICD-10-CM

## 2015-03-30 DIAGNOSIS — W19XXXA Unspecified fall, initial encounter: Secondary | ICD-10-CM | POA: Diagnosis not present

## 2015-03-30 LAB — COMPREHENSIVE METABOLIC PANEL WITH GFR
ALT: 15 U/L (ref 0–35)
AST: 16 U/L (ref 0–37)
Albumin: 3.8 g/dL (ref 3.5–5.2)
Alkaline Phosphatase: 65 U/L (ref 39–117)
BUN: 15 mg/dL (ref 6–23)
CO2: 27 meq/L (ref 19–32)
Calcium: 9.3 mg/dL (ref 8.4–10.5)
Chloride: 100 meq/L (ref 96–112)
Creatinine, Ser: 0.78 mg/dL (ref 0.40–1.20)
GFR: 77.06 mL/min
Glucose, Bld: 132 mg/dL — ABNORMAL HIGH (ref 70–99)
Potassium: 4.4 meq/L (ref 3.5–5.1)
Sodium: 132 meq/L — ABNORMAL LOW (ref 135–145)
Total Bilirubin: 0.3 mg/dL (ref 0.2–1.2)
Total Protein: 6.8 g/dL (ref 6.0–8.3)

## 2015-03-30 LAB — HEMOGLOBIN A1C: Hgb A1c MFr Bld: 5.9 % (ref 4.6–6.5)

## 2015-03-30 MED ORDER — TERBINAFINE HCL 250 MG PO TABS: 250.0000 mg | ORAL_TABLET | Freq: Every day | ORAL | Status: DC

## 2015-03-30 MED ORDER — MELOXICAM 15 MG PO TABS: 15.0000 mg | ORAL_TABLET | Freq: Every day | ORAL | Status: DC

## 2015-03-30 NOTE — Patient Instructions (Addendum)
Please go to [facility] at your leisure for your hip x rays   I am prescribing once daily meloxicam for your pain .  You an add tylenol 500 mg every 6 hours  If needed   I will prescribe lamasil for your toenail fungus  STOP THE SIMVASTATIN IMMEDIATELY..  DO NOT START THE LAMASIL FOR ONE WEEK

## 2015-03-30 NOTE — Progress Notes (Signed)
Pre-visit discussion using our clinic review tool. No additional management support is needed unless otherwise documented below in the visit note.  

## 2015-03-30 NOTE — Progress Notes (Signed)
Subjective:  Patient ID: Jane Davidson, female    DOB: [birth year]  Age: 74 y.o. MRN: [id number]  CC: The primary encounter diagnosis was Diabetes mellitus without complication. Diagnoses of Hip pain, acute, right, Acute right hip pain, and Onychomycosis of left great toe were also pertinent to this visit.  HPI Jane Davidson presents for right leg pain worse worse with walkng up steps.    Had a fall a month ago on that side  No bruising at the time.  But her hip has only been aching for the past  Week been aching   2) toenail  on left foot thickened with fungal infection.    Outpatient Prescriptions Prior to Visit  Medication Sig Dispense Refill  . ADVAIR DISKUS 100-50 MCG/DOSE AEPB ONE PUFF EVERY 12 HOURS AS DIRECTED. RINSE MOUTH AFTER USING 60 each 5  . azithromycin (ZITHROMAX) 250 MG tablet Take 2 tablets by mouth on day 1, take 1 tablet by mouth each day after for 4 days. 6 each 0  . Cholecalciferol (VITAMIN D3) 1000 UNITS CAPS Take 1 capsule by mouth daily.    . clobetasol cream (TEMOVATE) 1.60 % Apply 1 application topically 2 (two) times daily. 30 g 3  . cyanocobalamin 100 MCG tablet Take 100 mcg by mouth daily.      . fluticasone (FLONASE) 50 MCG/ACT nasal spray Place 2 sprays into both nostrils daily. 16 g 4  . sertraline (ZOLOFT) 50 MG tablet Take 50 mg by mouth daily.      . simvastatin (ZOCOR) 20 MG tablet TAKE ONE TABLET AT BEDTIME 90 tablet 1  . SYNTHROID 88 MCG tablet TAKE ONE (1) TABLET EACH DAY BEFORE BREAKFAST 90 tablet 2   No facility-administered medications prior to visit.    Review of Systems;  Patient denies headache, fevers, malaise, unintentional weight loss, skin rash, eye pain, sinus congestion and sinus pain, sore throat, dysphagia,  hemoptysis , cough, dyspnea, wheezing, chest pain, palpitations, orthopnea, edema, abdominal pain, nausea, melena, diarrhea, constipation, flank pain, dysuria, hematuria, urinary  Frequency, nocturia, numbness, tingling,  seizures,  Focal weakness, Loss of consciousness,  Tremor, insomnia, depression, anxiety, and suicidal ideation.      Objective:  BP 148/72 mmHg  Pulse 80  Temp(Src) 98.2 F (36.8 C) (Oral)  Resp 16  Ht 5\' 5"  (1.651 m)  Wt 164 lb 8 oz (74.617 kg)  BMI 27.37 kg/m2  SpO2 97%  BP Readings from Last 3 Encounters:  03/30/15 148/72  02/26/15 160/80  12/12/14 132/68    Wt Readings from Last 3 Encounters:  03/30/15 164 lb 8 oz (74.617 kg)  02/26/15 167 lb (75.751 kg)  12/12/14 168 lb 4 oz (76.318 kg)    General appearance: alert, cooperative and appears stated age Ears: normal TM's and external ear canals both ears Throat: lips, mucosa, and tongue normal; teeth and gums normal Neck: no adenopathy, no carotid bruit, supple, symmetrical, trachea midline and thyroid not enlarged, symmetric, no tenderness/mass/nodules Back: symmetric, no curvature. ROM normal. No CVA tenderness. Lungs: clear to auscultation bilaterally Heart: regular rate and rhythm, S1, S2 normal, no murmur, click, rub or gallop Abdomen: soft, non-tender; bowel sounds normal; no masses,  no organomegaly Pulses: 2+ and symmetric Skin: Skin color, texture, turgor normal. No rashes or lesions Lymph nodes: Cervical, supraclavicular, and axillary nodes normal.  Lab Results  Component Value Date   HGBA1C 5.9 03/30/2015   HGBA1C 6.2 12/12/2014   HGBA1C 6.2 06/06/2014    Lab Results  Component  Value Date   CREATININE 0.78 03/30/2015   CREATININE 0.81 12/12/2014   CREATININE 0.80 11/03/2014    Lab Results  Component Value Date   WBC 18.7* 11/03/2014   HGB 11.3* 11/03/2014   HCT 34.7* 11/03/2014   PLT 259 11/03/2014   GLUCOSE 132* 03/30/2015   CHOL 136 12/12/2014   TRIG 153.0* 12/12/2014   HDL 44.90 12/12/2014   LDLCALC 61 12/12/2014   ALT 15 03/30/2015   AST 16 03/30/2015   NA 132* 03/30/2015   K 4.4 03/30/2015   CL 100 03/30/2015   CREATININE 0.78 03/30/2015   BUN 15 03/30/2015   CO2 27 03/30/2015     TSH 1.76 12/12/2014   INR 1.0 11/03/2014   HGBA1C 5.9 03/30/2015   MICROALBUR <0.7 12/12/2014    No results found.  Assessment & Plan:   Problem List Items Addressed This Visit    Acute right hip pain    Plain films show only mild DJD , unchanged from 2014. Advised to see Orthopedics for hip injection .  Adding   NSAID      Onychomycosis of left great toe    Liver enzymes have been evaluated and are normal at baseline.  spatient advised to suspend simvastatin and in a week start lamisil       Relevant Medications   terbinafine (LAMISIL) 250 MG tablet    Other Visit Diagnoses    Diabetes mellitus without complication    -  Primary    Relevant Orders    Comprehensive metabolic panel (Completed)    Hemoglobin A1c (Completed)    Hip pain, acute, right        Relevant Orders    DG HIP UNILAT WITH PELVIS 2-3 VIEWS RIGHT (Completed)       I am having Jane Davidson start on terbinafine and meloxicam. I am also having her maintain her sertraline, cyanocobalamin, Vitamin D3, SYNTHROID, ADVAIR DISKUS, clobetasol cream, simvastatin, fluticasone, and azithromycin.  Meds ordered this encounter  Medications  . terbinafine (LAMISIL) 250 MG tablet    Sig: Take 1 tablet (250 mg total) by mouth daily.    Dispense:  30 tablet    Refill:  2  . meloxicam (MOBIC) 15 MG tablet    Sig: Take 1 tablet (15 mg total) by mouth daily.    Dispense:  30 tablet    Refill:  3    There are no discontinued medications.  Follow-up: Return in about 3 months (around 06/30/2015) for follow up diabetes.   Crecencio Mc, MD

## 2015-04-01 ENCOUNTER — Encounter: Payer: Self-pay | Admitting: Internal Medicine

## 2015-04-01 DIAGNOSIS — B351 Tinea unguium: Secondary | ICD-10-CM | POA: Insufficient documentation

## 2015-04-01 DIAGNOSIS — G8929 Other chronic pain: Secondary | ICD-10-CM | POA: Insufficient documentation

## 2015-04-01 DIAGNOSIS — M25552 Pain in left hip: Secondary | ICD-10-CM | POA: Insufficient documentation

## 2015-04-01 DIAGNOSIS — M25551 Pain in right hip: Secondary | ICD-10-CM

## 2015-04-01 NOTE — Assessment & Plan Note (Addendum)
Plain films show only mild DJD , unchanged from 2014. Advised to see Orthopedics for hip injection .  Adding   NSAID

## 2015-04-01 NOTE — Assessment & Plan Note (Signed)
Liver enzymes have been evaluated and are normal at baseline.  spatient advised to suspend simvastatin and in a week start lamisil

## 2015-04-12 ENCOUNTER — Telehealth: Payer: Self-pay

## 2015-04-12 DIAGNOSIS — M25551 Pain in right hip: Secondary | ICD-10-CM

## 2015-04-12 NOTE — Telephone Encounter (Signed)
Patient X-Ray results ON 03/30/15 referral to ortho patient advise, patient now requesting please advise?

## 2015-04-12 NOTE — Telephone Encounter (Signed)
Your referral is in process as requested. Our referral coordinator will call you when the appointment has been made.  

## 2015-04-12 NOTE — Telephone Encounter (Signed)
The patient called and is hoping to have a referral put in for her knee pain.

## 2015-04-12 NOTE — Telephone Encounter (Signed)
Left message to call office

## 2015-04-13 NOTE — Telephone Encounter (Signed)
Spoke with the pt, Pt had her ortho appt this morning.

## 2015-06-27 ENCOUNTER — Encounter: Payer: Self-pay | Admitting: Family Medicine

## 2015-06-27 ENCOUNTER — Ambulatory Visit (INDEPENDENT_AMBULATORY_CARE_PROVIDER_SITE_OTHER): Payer: PPO | Admitting: Family Medicine

## 2015-06-27 ENCOUNTER — Encounter (INDEPENDENT_AMBULATORY_CARE_PROVIDER_SITE_OTHER): Payer: Self-pay

## 2015-06-27 VITALS — BP 144/82 | HR 75 | Temp 98.7°F | Ht 64.0 in | Wt 168.0 lb

## 2015-06-27 DIAGNOSIS — M25551 Pain in right hip: Secondary | ICD-10-CM

## 2015-06-27 NOTE — Progress Notes (Signed)
Pre visit review using our clinic review tool, if applicable. No additional management support is needed unless otherwise documented below in the visit note. 

## 2015-06-27 NOTE — Patient Instructions (Addendum)
Nice to meet you. Your pain is likely related to your hip arthritis.  Please start the meloxicam that was previously prescribed for your hip pain. Take this with food. If you get upset stomach with this please stop the medication and let us know.  Please keep your orthopedic appointment tomorrow.  If you develop worsening pain, fever, swelling, cold feet, or feel poorly please seek medical attention.

## 2015-06-28 NOTE — Assessment & Plan Note (Signed)
Patient with acute worsening of hip pain. Based on exam suspect this is related to DJD. Unlikely related to PAD given good pulses and warm extremities. Neurologically intact. Discussed obtaining repeat films, though patient declined opting to wait until her ortho appointment tomorrow. Advised to start the meloxicam previously prescribed by Dr Derrel Nip. Advised to not take ibuprofen with this. Given return precautions.

## 2015-06-28 NOTE — Progress Notes (Signed)
Patient ID: Jane Davidson, female   DOB: [birth year], 75 y.o.   MRN: [id number]  Jane Rumps, MD Phone: [phone]  Jane Davidson is a 74 y.o. female who presents today for same day appointment.  Right hip pain: patient notes onset this morning. Notes she woke up with an aching discomfort in her left anterior hip. Notes it hurts with movement of the hip. Hurts to bear weight. Radiates to her upper thigh. Denies LE swelling and fevers. 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 Has an ortho appointment tomorrow. Has been taking ibuprofen for this with some benefit. 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 Denies exertional component to this. No cold extremities.   PMH: nonsmoker. History of OA.   ROS see HPI  Objective  Physical Exam Filed Vitals:   06/27/15 1535  BP: 144/82  Pulse:   Temp:     Physical Exam  Constitutional: She is well-developed, well-nourished, and in no distress.  HENT:  Head: Normocephalic and atraumatic.  Mouth/Throat: Oropharynx is clear and moist.  Cardiovascular: Normal rate, regular rhythm and normal heart sounds.  Exam reveals no gallop and no friction rub.   No murmur heard. Pulmonary/Chest: Effort normal and breath sounds normal. No respiratory distress. She has no wheezes. She has no rales.  Abdominal: Soft. Bowel sounds are normal. She exhibits no distension. There is no tenderness. There is no rebound and no guarding.  Musculoskeletal:  No anterior hip tenderness or swelling, no lateral hip tenderness or swelling, no erythema, 2+ femoral pulses, 2+ DP pulses, has decreased ROM in right hip with discomfort on internal rotation, full left hip ROM with no discomfort  Neurological: She is alert.  5/5 strength in bilateral quads, hamstrings, plantar and dorsiflexion, sensation to light touch intact in bilateral LE, normal gait, 2+ patellar reflexes  Skin: Skin is warm and dry. She is not  diaphoretic.     Assessment/Plan: Please see individual problem list.  Acute right hip pain Patient with acute worsening of hip pain. Based on exam suspect this is related to DJD. Unlikely related to PAD given good pulses and warm extremities. Neurologically intact. Discussed obtaining repeat films, though patient declined opting to wait until her ortho appointment tomorrow. Advised to start the meloxicam previously prescribed by Dr Derrel Nip. Advised to not take ibuprofen with this. Given return precautions.     Jane Davidson

## 2015-06-29 ENCOUNTER — Other Ambulatory Visit: Payer: Self-pay

## 2015-06-29 MED ORDER — SYNTHROID 88 MCG PO TABS: ORAL_TABLET | ORAL | Status: DC

## 2015-07-10 ENCOUNTER — Other Ambulatory Visit: Payer: Self-pay | Admitting: Orthopedic Surgery

## 2015-07-10 DIAGNOSIS — M5416 Radiculopathy, lumbar region: Secondary | ICD-10-CM

## 2015-07-18 ENCOUNTER — Ambulatory Visit
Admission: RE | Admit: 2015-07-18 | Discharge: 2015-07-18 | Disposition: A | Discharge status: Home / Self Care | Payer: PPO | Source: Ambulatory Visit | Attending: Orthopedic Surgery | Admitting: Orthopedic Surgery

## 2015-07-18 DIAGNOSIS — M4806 Spinal stenosis, lumbar region: Secondary | ICD-10-CM | POA: Insufficient documentation

## 2015-07-18 DIAGNOSIS — M5416 Radiculopathy, lumbar region: Secondary | ICD-10-CM | POA: Diagnosis present

## 2015-08-08 ENCOUNTER — Other Ambulatory Visit: Payer: Self-pay

## 2015-08-08 MED ORDER — SIMVASTATIN 20 MG PO TABS: 20.0000 mg | ORAL_TABLET | Freq: Every day | ORAL | Status: DC

## 2015-08-30 ENCOUNTER — Telehealth: Payer: Self-pay

## 2015-08-30 MED ORDER — MELOXICAM 15 MG PO TABS: 15.0000 mg | ORAL_TABLET | Freq: Every day | ORAL | Status: DC

## 2015-08-30 MED ORDER — TRAMADOL HCL 50 MG PO TABS: 50.0000 mg | ORAL_TABLET | Freq: Four times a day (QID) | ORAL | Status: DC | PRN

## 2015-08-30 NOTE — Telephone Encounter (Signed)
Pt is requesting a rx on Tramadol. She has upcoming appt to see her specialist in the next couple weeks.

## 2015-08-30 NOTE — Telephone Encounter (Signed)
Tramadol printed 

## 2015-09-08 LAB — HM DIABETES EYE EXAM

## 2015-09-19 ENCOUNTER — Telehealth: Payer: Self-pay | Admitting: *Deleted

## 2015-09-19 NOTE — Telephone Encounter (Signed)
Patient requested to be advised if she should have thyroid blood work. Please adviser

## 2015-10-01 ENCOUNTER — Telehealth: Payer: Self-pay | Admitting: Internal Medicine

## 2015-10-01 NOTE — Telephone Encounter (Signed)
Left msg for pt to call office to schedule [email or web address].msn °

## 2015-10-09 ENCOUNTER — Ambulatory Visit (INDEPENDENT_AMBULATORY_CARE_PROVIDER_SITE_OTHER): Payer: PPO

## 2015-10-09 VITALS — BP 132/70 | HR 70 | Temp 98.2°F | Resp 14 | Ht 64.0 in | Wt 165.1 lb

## 2015-10-09 DIAGNOSIS — Z0000 Encounter for general adult medical examination without abnormal findings: Secondary | ICD-10-CM | POA: Diagnosis not present

## 2015-10-09 NOTE — Progress Notes (Signed)
Subjective:   Jane Davidson is a 74 y.o. female who presents for Medicare Annual (Subsequent) preventive examination.  Review of Systems:  No ROS.  Medicare Wellness Visit.  Cardiac Risk Factors include: advanced age (>30men, >37 women);diabetes mellitus     Objective:     Vitals: BP 132/70 mmHg  Pulse 70  Temp(Src) 98.2 F (36.8 C) (Oral)  Resp 14  Ht 5\' 4"  (1.626 m)  Wt 165 lb 1.9 oz (74.898 kg)  BMI 28.33 kg/m2  SpO2 98%  Tobacco History  Smoking status  . Current Every Day Smoker -- 1.00 packs/day  . Types: Cigarettes  Smokeless tobacco  . Never Used     Ready to quit: Not Answered Counseling given: Not Answered   Past Medical History  Diagnosis Date  . Hyperlipidemia   . Bursitis NEC   . Osteopenia 2009     T scores - 1.5 DEXA 2009  . Tobacco abuse disorder   . COPD (chronic obstructive pulmonary disease) ([place])   . hypothyroid   . Vitamin D deficiency April 2011    replaced withDrsido for level of 11.3 ng/ml  . Vitamin D deficiency April 2011    replaced withDrsido for level of 11.3 ng/ml  . Screening for breast cancer May 2012    [facility] mammogram  . Abnormal Pap smear of cervix 2001    Biopsy normal  . History of peripheral vascular disease 2001    s/p bilateral aorto-iliac-femoral bypass /[facility]   Past Surgical History  Procedure Laterality Date  . Hernia repair  Jun 2011    [name]   . Cataract extraction  Sept 2011  . Cholecystectomy    . Tubal ligation     Family History  Problem Relation Age of Onset  . Hypertension Mother   . Arthritis Mother   . Diabetes Father    History  Sexual Activity  . Sexual Activity: No    Outpatient Encounter Prescriptions as of 10/09/2015  Medication Sig  . ADVAIR DISKUS 100-50 MCG/DOSE AEPB ONE PUFF EVERY 12 HOURS AS DIRECTED. RINSE MOUTH AFTER USING  . Cholecalciferol (VITAMIN D3) 1000 UNITS CAPS Take 1 capsule by mouth daily.  . clobetasol cream (TEMOVATE) [id number] % Apply 1 application  topically 2 (two) times daily.  . cyanocobalamin 100 MCG tablet Take 100 mcg by mouth daily.    . fluticasone (FLONASE) 50 MCG/ACT nasal spray Place 2 sprays into both nostrils daily.  . meloxicam (MOBIC) 15 MG tablet Take 1 tablet (15 mg total) by mouth daily.  . sertraline (ZOLOFT) 50 MG tablet Take 50 mg by mouth daily.    . simvastatin (ZOCOR) 20 MG tablet Take 1 tablet (20 mg total) by mouth at bedtime.  Marland Kitchen SYNTHROID 88 MCG tablet TAKE ONE (1) TABLET EACH DAY BEFORE BREAKFAST  . terbinafine (LAMISIL) 250 MG tablet Take 1 tablet (250 mg total) by mouth daily.  . traMADol (ULTRAM) 50 MG tablet Take 1 tablet (50 mg total) by mouth every 6 (six) hours as needed.   No facility-administered encounter medications on file as of 10/09/2015.    Activities of Daily Living In your present state of health, do you have any difficulty performing the following activities: 10/09/2015  Hearing? N  Vision? N  Difficulty concentrating or making decisions? N  Walking or climbing stairs? N  Dressing or bathing? N  Doing errands, shopping? N  Preparing Food and eating ? N  Using the Toilet? N  In the past six months, have  you accidently leaked urine? N  Do you have problems with loss of bowel control? N  Managing your Medications? N  Managing your Finances? N  Housekeeping or managing your Housekeeping? N    Patient Care Team: Crecencio Mc, MD as PCP - General (Internal Medicine)    Assessment:   This is a routine wellness examination for Jane Davidson. The goal of the wellness visit is to assist the patient how to close the gaps in care and create a preventative care plan for the patient.   VIT D Calcium as appropriate/ Osteoporosis risk reviewed.  Medications reviewed; taking without issues or barriers.  Safety issues reviewed; smoke detectors in the home. Firearms locked in a secured area in the home. Wears seatbelts when driving or riding with others. No violence in the home.  DEXA Scan  postponed, per patient request.  No identified risk were noted; The patient was oriented x 3; appropriate in dress and manner and no objective failures at ADL's or IADL's.   Obs chr bronc w(ac) exac-stable and followed by PCP Chronic obstructive pulmonary disease w(acute)ex-stable and followed by PCP. Chr airway obstruct NEC-stable and followed by PCP. Chronic obstructive pulmonary disease, unspecified- stable and followed by PCP.  Patient Concerns:  None at this time.  Follow up with PCP as needed.  Exercise Activities and Dietary recommendations Current Exercise Habits:: Home exercise routine, Type of exercise: walking, Time (Minutes): 20, Frequency (Times/Week): 5, Weekly Exercise (Minutes/Week): 100, Intensity: Mild  Goals    . Patient centered goal     Currently drinks 4 cups water daily-increase to 8 cups water daily.    . Reduce sugar intake     Reduce portion size of sweets (Candy, cakes, cookies, pies etc...)       Fall Risk Fall Risk  10/09/2015 02/26/2015 09/20/2012  Falls in the past year? Yes Yes No  Number falls in past yr: 1 1 -  Injury with Fall? No No -  Follow up Education provided;Falls prevention discussed - -   Depression Screen PHQ 2/9 Scores 10/09/2015 02/26/2015  PHQ - 2 Score 0 0     Cognitive Testing MMSE - Mini Mental State Exam 10/09/2015  Orientation to time 5  Orientation to Place 5  Registration 3  Attention/ Calculation 5  Recall 3  Language- name 2 objects 2  Language- repeat 1  Language- follow 3 step command 3  Language- read & follow direction 1  Write a sentence 1  Copy design 1  Total score 30    Immunization History  Administered Date(s) Administered  . Influenza Split 07/05/2012, 07/27/2013, 06/29/2014  . Influenza-Unspecified 07/20/2015  . Pneumococcal Conjugate-13 04/06/2014  . Pneumococcal Polysaccharide-23 07/05/2012  . Tdap 10/16/2011  . Zoster 12/19/2011   Screening Tests Health Maintenance  Topic Date Due  .  DEXA SCAN  10/07/2007  . HEMOGLOBIN A1C  09/29/2015  . FOOT EXAM  12/12/2015  . URINE MICROALBUMIN  12/12/2015  . INFLUENZA VACCINE  05/13/2016  . OPHTHALMOLOGY EXAM  08/13/2016  . MAMMOGRAM  09/04/2016  . COLONOSCOPY  05/21/2020  . TETANUS/TDAP  10/15/2021  . ZOSTAVAX  Completed  . PNA vac Low Risk Adult  Completed      Plan:   End of life planning; Advance aging; Advanced directives discussed. Copy requested of current HCPOA/Living Will.   Return in January for scheduled appointment with PCP.  During the course of the visit the patient was educated and counseled about the following appropriate screening and preventive services:  Vaccines to include Pneumoccal, Influenza, Hepatitis B, Td, Zostavax, HCV  Electrocardiogram  Cardiovascular Disease  Colorectal cancer screening  Bone density screening  Diabetes screening  Glaucoma screening  Mammography/PAP  Nutrition counseling   Patient Instructions (the written plan) was given to the patient.   Varney Biles, LPN  [phone]

## 2015-10-09 NOTE — Patient Instructions (Addendum)
Ms. Denardis,  Thank you for taking time to come for your Medicare Wellness Visit.  I appreciate your ongoing commitment to your health goals. Please review the following plan we discussed and let me know if I can assist you in the future.  Happy New Year!  Return in January for follow up.  Bone Densitometry Bone densitometry is an imaging test that uses a special X-ray to measure the amount of calcium and other minerals in your bones (bone density). This test is also known as a bone mineral density test or dual-energy X-ray absorptiometry (DXA). The test can measure bone density at your hip and your spine. It is similar to having a regular X-ray. You may have this test to:  Diagnose a condition that causes weak or thin bones (osteoporosis).  Predict your risk of a broken bone (fracture).  Determine how well osteoporosis treatment is working. LET [facility] CARE PROVIDER KNOW ABOUT:  Any allergies you have.  All medicines you are taking, including vitamins, herbs, eye drops, creams, and over-the-counter medicines.  Previous problems you or members of your family have had with the use of anesthetics.  Any blood disorders you have.  Previous surgeries you have had.  Medical conditions you have.  Possibility of pregnancy.  Any other medical test you had within the previous 14 days that used contrast material. RISKS AND COMPLICATIONS Generally, this is a safe procedure. However, problems can occur and may include the following:  This test exposes you to a very small amount of radiation.  The risks of radiation exposure may be greater to unborn children. BEFORE THE PROCEDURE  Do not take any calcium supplements for 24 hours before having the test. You can otherwise eat and drink what you usually do.  Take off all metal jewelry, eyeglasses, dental appliances, and any other metal objects. PROCEDURE  You may lie on an exam table. There will be an X-ray generator below you and an  imaging device above you.  Other devices, such as boxes or braces, may be used to position your body properly for the scan.  You will need to lie still while the machine slowly scans your body.  The images will show up on a computer monitor. AFTER THE PROCEDURE You may need more testing at a later time.   This information is not intended to replace advice given to you by your health care provider. Make sure you discuss any questions you have with your health care provider.   Document Released: 10/21/2004 Document Revised: 10/20/2014 Document Reviewed: 03/09/2014 Elsevier Interactive Patient Education 2016 [employer].  Steps to Quit Smoking  Smoking tobacco can be harmful to your health and can affect almost every organ in your body. Smoking puts you, and those around you, at risk for developing many serious chronic diseases. Quitting smoking is difficult, but it is one of the best things that you can do for your health. It is never too late to quit. WHAT ARE THE BENEFITS OF QUITTING SMOKING? When you quit smoking, you lower your risk of developing serious diseases and conditions, such as:  Lung cancer or lung disease, such as COPD.  Heart disease.  Stroke.  Heart attack.  Infertility.  Osteoporosis and bone fractures. Additionally, symptoms such as coughing, wheezing, and shortness of breath may get better when you quit. You may also find that you get sick less often because your body is stronger at fighting off colds and infections. If you are pregnant, quitting smoking can help  to reduce your chances of having a baby of low birth weight. HOW DO I GET READY TO QUIT? When you decide to quit smoking, create a plan to make sure that you are successful. Before you quit:  Pick a date to quit. Set a date within the next two weeks to give you time to prepare.  Write down the reasons why you are quitting. Keep this list in places where you will see it often, such as on your bathroom  mirror or in your car or wallet.  Identify the people, places, things, and activities that make you want to smoke (triggers) and avoid them. Make sure to take these actions:  Throw away all cigarettes at home, at work, and in your car.  Throw away smoking accessories, such as Scientist, research (medical).  Clean your car and make sure to empty the ashtray.  Clean your home, including curtains and carpets.  Tell your family, friends, and coworkers that you are quitting. Support from your loved ones can make quitting easier.  Talk with your health care provider about your options for quitting smoking.  Find out what treatment options are covered by your health insurance. WHAT STRATEGIES CAN I USE TO QUIT SMOKING?  Talk with your healthcare provider about different strategies to quit smoking. Some strategies include:  Quitting smoking altogether instead of gradually lessening how much you smoke over a period of time. Research shows that quitting "cold [place]" is more successful than gradually quitting.  Attending in-person counseling to help you build problem-solving skills. You are more likely to have success in quitting if you attend several counseling sessions. Even short sessions of 10 minutes can be effective.  Finding resources and support systems that can help you to quit smoking and remain smoke-free after you quit. These resources are most helpful when you use them often. They can include:  Online chats with a Social worker.  Telephone quitlines.  Printed Furniture conservator/restorer.  Support groups or group counseling.  Text messaging programs.  Mobile phone applications.  Taking medicines to help you quit smoking. (If you are pregnant or breastfeeding, talk with your health care provider first.) Some medicines contain nicotine and some do not. Both types of medicines help with cravings, but the medicines that include nicotine help to relieve withdrawal symptoms. Your health care provider  may recommend:  Nicotine patches, gum, or lozenges.  Nicotine inhalers or sprays.  Non-nicotine medicine that is taken by mouth. Talk with your health care provider about combining strategies, such as taking medicines while you are also receiving in-person counseling. Using these two strategies together makes you more likely to succeed in quitting than if you used either strategy on its own. If you are pregnant or breastfeeding, talk with your health care provider about finding counseling or other support strategies to quit smoking. Do not take medicine to help you quit smoking unless told to do so by your health care provider. WHAT THINGS CAN I DO TO MAKE IT EASIER TO QUIT? Quitting smoking might feel overwhelming at first, but there is a lot that you can do to make it easier. Take these important actions:  Reach out to your family and friends and ask that they support and encourage you during this time. Call telephone quitlines, reach out to support groups, or work with a counselor for support.  Ask people who smoke to avoid smoking around you.  Avoid places that trigger you to smoke, such as bars, parties, or smoke-break areas at work.  Spend time around people who do not smoke.  Lessen stress in your life, because stress can be a smoking trigger for some people. To lessen stress, try:  Exercising regularly.  Deep-breathing exercises.  Yoga.  Meditating.  Performing a body scan. This involves closing your eyes, scanning your body from head to toe, and noticing which parts of your body are particularly tense. Purposefully relax the muscles in those areas.  Download or purchase mobile phone or tablet apps (applications) that can help you stick to your quit plan by providing reminders, tips, and encouragement. There are many free apps, such as QuitGuide from the [employer] Office manager for Disease Control and Prevention). You can find other support for quitting smoking (smoking cessation)  through smokefree.gov and other websites. HOW WILL I FEEL WHEN I QUIT SMOKING? Within the first 24 hours of quitting smoking, you may start to feel some withdrawal symptoms. These symptoms are usually most noticeable 2-3 days after quitting, but they usually do not last beyond 2-3 weeks. Changes or symptoms that you might experience include:  Mood swings.  Restlessness, anxiety, or irritation.  Difficulty concentrating.  Dizziness.  Strong cravings for sugary foods in addition to nicotine.  Mild weight gain.  Constipation.  Nausea.  Coughing or a sore throat.  Changes in how your medicines work in your body.  A depressed mood.  Difficulty sleeping (insomnia). After the first 2-3 weeks of quitting, you may start to notice more positive results, such as:  Improved sense of smell and taste.  Decreased coughing and sore throat.  Slower heart rate.  Lower blood pressure.  Clearer skin.  The ability to breathe more easily.  Fewer sick days. Quitting smoking is very challenging for most people. Do not get discouraged if you are not successful the first time. Some people need to make many attempts to quit before they achieve long-term success. Do your best to stick to your quit plan, and talk with your health care provider if you have any questions or concerns.   This information is not intended to replace advice given to you by your health care provider. Make sure you discuss any questions you have with your health care provider.   Document Released: 09/23/2001 Document Revised: 02/13/2015 Document Reviewed: 02/13/2015 Elsevier Interactive Patient Education [employer].

## 2015-10-09 NOTE — Progress Notes (Signed)
  I have reviewed the above information and agree with above.   Arriel Victor, MDd 

## 2015-11-08 ENCOUNTER — Encounter: Payer: Self-pay | Admitting: Internal Medicine

## 2015-11-08 ENCOUNTER — Ambulatory Visit (INDEPENDENT_AMBULATORY_CARE_PROVIDER_SITE_OTHER): Payer: PPO | Admitting: Internal Medicine

## 2015-11-08 VITALS — BP 160/82 | HR 81 | Temp 97.8°F | Resp 12 | Ht 64.25 in | Wt 163.2 lb

## 2015-11-08 DIAGNOSIS — E119 Type 2 diabetes mellitus without complications: Secondary | ICD-10-CM

## 2015-11-08 DIAGNOSIS — K5900 Constipation, unspecified: Secondary | ICD-10-CM

## 2015-11-08 DIAGNOSIS — E039 Hypothyroidism, unspecified: Secondary | ICD-10-CM

## 2015-11-08 DIAGNOSIS — E559 Vitamin D deficiency, unspecified: Secondary | ICD-10-CM | POA: Diagnosis not present

## 2015-11-08 DIAGNOSIS — E785 Hyperlipidemia, unspecified: Secondary | ICD-10-CM

## 2015-11-08 DIAGNOSIS — Z716 Tobacco abuse counseling: Secondary | ICD-10-CM

## 2015-11-08 LAB — MICROALBUMIN / CREATININE URINE RATIO
Creatinine,U: 73.7 mg/dL
Microalb Creat Ratio: 1 mg/g (ref 0.0–30.0)
Microalb, Ur: <0.7 mg/dL (ref 0.0–1.9)

## 2015-11-08 LAB — LIPID PANEL
CHOL/HDL RATIO: 2
Cholesterol: 147 mg/dL (ref 0–200)
HDL: 60.6 mg/dL (ref >39.00)
LDL CALC: 75 mg/dL (ref 0–99)
NonHDL: 86.37
TRIGLYCERIDES: 58 mg/dL (ref 0.0–149.0)
VLDL: 11.6 mg/dL (ref 0.0–40.0)

## 2015-11-08 LAB — COMPREHENSIVE METABOLIC PANEL
ALT: 12 U/L (ref 0–35)
AST: 18 U/L (ref 0–37)
Albumin: 4.1 g/dL (ref 3.5–5.2)
Alkaline Phosphatase: 61 U/L (ref 39–117)
BUN: 14 mg/dL (ref 6–23)
CHLORIDE: 100 meq/L (ref 96–112)
CO2: 26 meq/L (ref 19–32)
Calcium: 9 mg/dL (ref 8.4–10.5)
Creatinine, Ser: 0.81 mg/dL (ref 0.40–1.20)
GFR: 73.65 mL/min (ref >60.00)
GLUCOSE: 113 mg/dL — AB (ref 70–99)
POTASSIUM: 4.3 meq/L (ref 3.5–5.1)
SODIUM: 132 meq/L — AB (ref 135–145)
Total Bilirubin: 0.6 mg/dL (ref 0.2–1.2)
Total Protein: 7.1 g/dL (ref 6.0–8.3)

## 2015-11-08 LAB — VITAMIN D 25 HYDROXY (VIT D DEFICIENCY, FRACTURES): VITD: 21.31 ng/mL — AB (ref 30.00–100.00)

## 2015-11-08 LAB — TSH: TSH: 4.88 u[IU]/mL — ABNORMAL HIGH (ref 0.35–4.50)

## 2015-11-08 LAB — LDL CHOLESTEROL, DIRECT: Direct LDL: 81 mg/dL

## 2015-11-08 LAB — HEMOGLOBIN A1C: HEMOGLOBIN A1C: 5.9 % (ref 4.6–6.5)

## 2015-11-08 MED ORDER — LACTULOSE 20 GM/30ML PO SOLN: ORAL | Status: DC

## 2015-11-08 NOTE — Progress Notes (Signed)
Subjective:  Patient ID: Jane Davidson, female    DOB: [birth year]  Age: 75 y.o. MRN: [id number]  CC: The primary encounter diagnosis was Vitamin D deficiency. Diagnoses of Hypothyroidism, unspecified hypothyroidism type, Hyperlipidemia, Controlled type 2 diabetes mellitus without complication, without long-term current use of insulin ([place]), Constipation, unspecified constipation type, Tobacco abuse counseling, and Diabetes mellitus without complication ([place]) were also pertinent to this visit.  HPI SYERA TEWELL presents for follow up on type 2 DM,  hypertenion hyperliidemia and toabacco abuse.  Last seen for nonacute issues in June 2016.  Seen in Sept for right hip pain , prescribed  meloxicam.  Had ortho evaluation resulted in MRI lumbar spine and Neurosurgical referral to [place] but saw [name].  Has not had ESI s yet. pain is not present daily , flares with standing up and radiates to right thigh only. Taking meloxicam daily and using tramadol 2 to 3 times daily. Pain is reduced from a 8-9 to 4-5 with the medication..  Not exercising, but wasn't exercising before the pain began in September.   2) Persistent constipation and gas , has been intermittent in the past but persistent since last week,  Used a Fleet's enema which relieved the fullness for a few days,  But still feels a fullness, and not moving bowels only after enema and MOM .  Using a stool softener daily   3) DM type 2: not checking sugars . Following a carb modified diet most of the time.  Had eye exam with Dr Matilde Sprang Nov 2016  4) tobacco abuse.  She continues to smoke, less than half a pack daily.  Spent 3 minutes discussing risk of continued tobacco abuse, including but not limited to CAD, worsening PAD, hypertension, and CA.  She is not interested in pharmacotherapy at this time.   Lab Results  Component Value Date   HGBA1C 5.9 11/08/2015    Outpatient Prescriptions Prior to Visit  Medication Sig Dispense Refill  .  ADVAIR DISKUS 100-50 MCG/DOSE AEPB ONE PUFF EVERY 12 HOURS AS DIRECTED. RINSE MOUTH AFTER USING 60 each 5  . Cholecalciferol (VITAMIN D3) 1000 UNITS CAPS Take 1 capsule by mouth daily.    . clobetasol cream (TEMOVATE) [id number] % Apply 1 application topically 2 (two) times daily. 30 g 3  . cyanocobalamin 100 MCG tablet Take 100 mcg by mouth daily.      . fluticasone (FLONASE) 50 MCG/ACT nasal spray Place 2 sprays into both nostrils daily. 16 g 4  . meloxicam (MOBIC) 15 MG tablet Take 1 tablet (15 mg total) by mouth daily. 30 tablet 3  . sertraline (ZOLOFT) 50 MG tablet Take 50 mg by mouth daily.      . simvastatin (ZOCOR) 20 MG tablet Take 1 tablet (20 mg total) by mouth at bedtime. 90 tablet 1  . terbinafine (LAMISIL) 250 MG tablet Take 1 tablet (250 mg total) by mouth daily. 30 tablet 2  . traMADol (ULTRAM) 50 MG tablet Take 1 tablet (50 mg total) by mouth every 6 (six) hours as needed. 120 tablet 0  . SYNTHROID 88 MCG tablet TAKE ONE (1) TABLET EACH DAY BEFORE BREAKFAST 90 tablet 2   No facility-administered medications prior to visit.    Review of Systems;  Patient denies headache, fevers, malaise, unintentional weight loss, skin rash, eye pain, sinus congestion and sinus pain, sore throat, dysphagia,  hemoptysis , cough, dyspnea, wheezing, chest pain, palpitations, orthopnea, edema, abdominal pain, nausea, melena, diarrhea, constipation, flank pain, dysuria, hematuria,  urinary  Frequency, nocturia, numbness, tingling, seizures,  Focal weakness, Loss of consciousness,  Tremor, insomnia, depression, anxiety, and suicidal ideation.      Objective:  BP 160/82 mmHg  Pulse 81  Temp(Src) 97.8 F (36.6 C) (Oral)  Resp 12  Ht 5' 4.25" (1.632 m)  Wt 163 lb 4 oz (74.05 kg)  BMI 27.80 kg/m2  SpO2 98%  BP Readings from Last 3 Encounters:  11/08/15 160/82  10/09/15 132/70  06/27/15 144/82    Wt Readings from Last 3 Encounters:  11/08/15 163 lb 4 oz (74.05 kg)  10/09/15 165 lb 1.9 oz (74.898  kg)  06/27/15 168 lb (76.204 kg)    General appearance: alert, cooperative and appears stated age Ears: normal TM's and external ear canals both ears Throat: lips, mucosa, and tongue normal; teeth and gums normal Neck: no adenopathy, no carotid bruit, supple, symmetrical, trachea midline and thyroid not enlarged, symmetric, no tenderness/mass/nodules Back: symmetric, no curvature. ROM normal. No CVA tenderness. Lungs: clear to auscultation bilaterally Heart: regular rate and rhythm, S1, S2 normal, no murmur, click, rub or gallop Abdomen: soft, non-tender; bowel sounds normal; no masses,  no organomegaly Pulses: 2+ and symmetric Skin: Skin color, texture, turgor normal. No rashes or lesions Lymph nodes: Cervical, supraclavicular, and axillary nodes normal.  Lab Results  Component Value Date   HGBA1C 5.9 11/08/2015   HGBA1C 5.9 03/30/2015   HGBA1C 6.2 12/12/2014    Lab Results  Component Value Date   CREATININE 0.81 11/08/2015   CREATININE 0.78 03/30/2015   CREATININE 0.81 12/12/2014    Lab Results  Component Value Date   WBC 18.7* 11/03/2014   HGB 11.3* 11/03/2014   HCT 34.7* 11/03/2014   PLT 259 11/03/2014   GLUCOSE 113* 11/08/2015   CHOL 147 11/08/2015   TRIG 58.0 11/08/2015   HDL 60.60 11/08/2015   LDLDIRECT 81.0 11/08/2015   LDLCALC 75 11/08/2015   ALT 12 11/08/2015   AST 18 11/08/2015   NA 132* 11/08/2015   K 4.3 11/08/2015   CL 100 11/08/2015   CREATININE 0.81 11/08/2015   BUN 14 11/08/2015   CO2 26 11/08/2015   TSH 4.88* 11/08/2015   INR 1.0 11/03/2014   HGBA1C 5.9 11/08/2015   MICROALBUR <0.7 11/08/2015    Mr Lumbar Spine Wo Contrast  07/18/2015  CLINICAL DATA:  Pain in the right hip in groin region for 3 months duration. Occasional extension down the back of the right leg. [place] in May of this year. EXAM: MRI LUMBAR SPINE WITHOUT CONTRAST TECHNIQUE: Multiplanar, multisequence MR imaging of the lumbar spine was performed. No intravenous contrast was  administered. COMPARISON:  None. FINDINGS: T11-12 and T12-L1: Desiccation and bulging of the discs. No apparent neural compression. The distal cord and conus are normal with the conus tip at lower L1. L1-2: Endplate osteophytes and bulging of the disc more prominent towards the left. Indentation of the thecal sac without visible neural compression. L2-3: Retrolisthesis of 2 mm. Circumferential bulging of the disc. Bilateral facet and ligamentous hypertrophy. Stenosis of both lateral recesses that could cause neural compression. L3-4: Bulging of the disc. Bilateral facet and ligamentous hypertrophy. Stenosis of both lateral recesses that could cause neural compression. L4-5: Circumferential bulging of the disc. Bilateral facet degeneration and hypertrophy. Stenosis of both lateral recesses and foramina that could cause neural compression. L5-S1: Advanced bilateral facet arthropathy with anterolisthesis of 1 cm. Desiccation of the disc. No central canal stenosis. No compressive foraminal stenosis demonstrated. IMPRESSION: Bilateral lateral recess stenosis at  L2-3, L3-4 and L4-5 because of bulging disc material and facet hypertrophy could cause neural compression on either side at those levels. Foraminal stenosis bilaterally at L4-5 could also be symptomatic. Advanced bilateral facet arthropathy at L5-S1 with 1 cm of anterolisthesis. No visible neural compression however. Electronically Signed   By: Bennette Chimes M.D.   On: 07/18/2015 13:02    Assessment & Plan:   Problem List Items Addressed This Visit    Hyperlipidemia    Well controlled on current medications .  :Liver enzymes are normal.  No changes today  Lab Results  Component Value Date   CHOL 147 11/08/2015   HDL 60.60 11/08/2015   LDLCALC 75 11/08/2015   LDLDIRECT 81.0 11/08/2015   TRIG 58.0 11/08/2015   CHOLHDL 2 11/08/2015   Lab Results  Component Value Date   ALT 12 11/08/2015   AST 18 11/08/2015   ALKPHOS 61 11/08/2015   BILITOT 0.6  11/08/2015             Relevant Orders   LDL cholesterol, direct (Completed)   Lipid panel (Completed)   Hypothyroidism    Thyroid function is sightly underactive on current dose.  Given her new onset constipation, will increase her dose of Synthroid to 100 mcg daily .   Lab Results  Component Value Date   TSH 4.88* 11/08/2015           Relevant Medications   levothyroxine (SYNTHROID, LEVOTHROID) 100 MCG tablet   Other Relevant Orders   TSH (Completed)   Tobacco abuse counseling    Risks of continued tobacco use were discussed. She is not currently interested in tobacco cessation.       Constipation    May be du to the tramadol she is using . Bowel regimen outlined.  Lactulose q 3 days, miralax daily.       RESOLVED: Diabetes mellitus type II, controlled ([place])   Relevant Orders   Comprehensive metabolic panel (Completed)   Hemoglobin A1c (Completed)   Microalbumin / creatinine urine ratio (Completed)   Diabetes mellitus without complication ([facility])    Historically well-controlled on diet alone.  hemoglobin A1c has been consistently at or  less than 7.0 . Patient is up-to-date on eye exams and foot exam is normal today. Patient has no proteinuria and is tolerating statin therapy for CAD risk reduction and on ACE/ARB for reduction in proteinuria.    Lab Results  Component Value Date   HGBA1C 5.9 11/08/2015   Lab Results  Component Value Date   MICROALBUR <0.7 11/08/2015         Vitamin D deficiency - Primary   Relevant Orders   VITAMIN D 25 Hydroxy (Vit-D Deficiency, Fractures) (Completed)     A total of 25 minutes of face to face time was spent with patient more than half of which was spent in counselling about the above mentioned conditions  and coordination of care  I have discontinued Ms. Kinyon's SYNTHROID. I am also having her start on Lactulose and levothyroxine. Additionally, I am having her maintain her sertraline, cyanocobalamin, Vitamin D3, ADVAIR  DISKUS, clobetasol cream, fluticasone, terbinafine, simvastatin, meloxicam, and traMADol.  Meds ordered this encounter  Medications  . Lactulose 20 GM/30ML SOLN    Sig: 30 ml every 4 hours until constipation is relieved    Dispense:  236 mL    Refill:  3  . levothyroxine (SYNTHROID, LEVOTHROID) 100 MCG tablet    Sig: Take 1 tablet (100 mcg total) by mouth daily.  Dispense:  90 tablet    Refill:  3    Medications Discontinued During This Encounter  Medication Reason  . SYNTHROID 88 MCG tablet     Follow-up: Return in about 3 months (around 02/06/2016).   Crecencio Mc, MD

## 2015-11-08 NOTE — Progress Notes (Signed)
Pre-visit discussion using our clinic review tool. No additional management support is needed unless otherwise documented below in the visit note.  

## 2015-11-08 NOTE — Patient Instructions (Addendum)
Start using miralax daily in the evening alon with your stool softener (up to 200 mg daily )   Lactulose use 30 ccs's every 3 to 4 hours initially  Until bowels are evacuated,  The reserve for use every 3 dasy if needed to produce a BM   The tramadol may be the cultprit  Smoking Cessation, Tips for Success If you are ready to quit smoking, congratulations! You have chosen to help yourself be healthier. Cigarettes bring nicotine, tar, carbon monoxide, and other irritants into your body. Your lungs, heart, and blood vessels will be able to work better without these poisons. There are many different ways to quit smoking. Nicotine gum, nicotine patches, a nicotine inhaler, or nicotine nasal spray can help with physical craving. Hypnosis, support groups, and medicines help break the habit of smoking. WHAT THINGS CAN I DO TO MAKE QUITTING EASIER?  Here are some tips to help you quit for good:  Pick a date when you will quit smoking completely. Tell all of your friends and family about your plan to quit on that date.  Do not try to slowly cut down on the number of cigarettes you are smoking. Pick a quit date and quit smoking completely starting on that day.  Throw away all cigarettes.   Clean and remove all ashtrays from your home, work, and car.  On a card, write down your reasons for quitting. Carry the card with you and read it when you get the urge to smoke.  Cleanse your body of nicotine. Drink enough water and fluids to keep your urine clear or pale yellow. Do this after quitting to flush the nicotine from your body.  Learn to predict your moods. Do not let a bad situation be your excuse to have a cigarette. Some situations in your life might tempt you into wanting a cigarette.  Never have "just one" cigarette. It leads to wanting another and another. Remind yourself of your decision to quit.  Change habits associated with smoking. If you smoked while driving or when feeling stressed, try  other activities to replace smoking. Stand up when drinking your coffee. Brush your teeth after eating. Sit in a different chair when you read the paper. Avoid alcohol while trying to quit, and try to drink fewer caffeinated beverages. Alcohol and caffeine may urge you to smoke.  Avoid foods and drinks that can trigger a desire to smoke, such as sugary or spicy foods and alcohol.  Ask people who smoke not to smoke around you.  Have something planned to do right after eating or having a cup of coffee. For example, plan to take a walk or exercise.  Try a relaxation exercise to calm you down and decrease your stress. Remember, you may be tense and nervous for the first 2 weeks after you quit, but this will pass.  Find new activities to keep your hands busy. Play with a pen, coin, or rubber band. Doodle or draw things on paper.  Brush your teeth right after eating. This will help cut down on the craving for the taste of tobacco after meals. You can also try mouthwash.   Use oral substitutes in place of cigarettes. Try using lemon drops, carrots, cinnamon sticks, or chewing gum. Keep them handy so they are available when you have the urge to smoke.  When you have the urge to smoke, try deep breathing.  Designate your home as a nonsmoking area.  If you are a heavy smoker, ask your health  care provider about a prescription for nicotine chewing gum. It can ease your withdrawal from nicotine.  Reward yourself. Set aside the cigarette money you save and buy yourself something nice.  Look for support from others. Join a support group or smoking cessation program. Ask someone at home or at work to help you with your plan to quit smoking.  Always ask yourself, "Do I need this cigarette or is this just a reflex?" Tell yourself, "Today, I choose not to smoke," or "I do not want to smoke." You are reminding yourself of your decision to quit.  Do not replace cigarette smoking with electronic cigarettes  (commonly called e-cigarettes). The safety of e-cigarettes is unknown, and some may contain harmful chemicals.  If you relapse, do not give up! Plan ahead and think about what you will do the next time you get the urge to smoke. HOW WILL I FEEL WHEN I QUIT SMOKING? You may have symptoms of withdrawal because your body is used to nicotine (the addictive substance in cigarettes). You may crave cigarettes, be irritable, feel very hungry, cough often, get headaches, or have difficulty concentrating. The withdrawal symptoms are only temporary. They are strongest when you first quit but will go away within 10-14 days. When withdrawal symptoms occur, stay in control. Think about your reasons for quitting. Remind yourself that these are signs that your body is healing and getting used to being without cigarettes. Remember that withdrawal symptoms are easier to treat than the major diseases that smoking can cause.  Even after the withdrawal is over, expect periodic urges to smoke. However, these cravings are generally short lived and will go away whether you smoke or not. Do not smoke! WHAT RESOURCES ARE AVAILABLE TO HELP ME QUIT SMOKING? Your health care provider can direct you to community resources or hospitals for support, which may include:  Group support.  Education.  Hypnosis.  Therapy.   This information is not intended to replace advice given to you by your health care provider. Make sure you discuss any questions you have with your health care provider.   Document Released: 06/27/2004 Document Revised: 10/20/2014 Document Reviewed: 03/17/2013 Elsevier Interactive Patient Education [employer].

## 2015-11-10 ENCOUNTER — Encounter: Payer: Self-pay | Admitting: Internal Medicine

## 2015-11-10 DIAGNOSIS — E119 Type 2 diabetes mellitus without complications: Secondary | ICD-10-CM | POA: Insufficient documentation

## 2015-11-10 MED ORDER — LEVOTHYROXINE SODIUM 100 MCG PO TABS: 100.0000 ug | ORAL_TABLET | Freq: Every day | ORAL | Status: DC

## 2015-11-10 NOTE — Assessment & Plan Note (Signed)
Diagnosed with fasting glucose of 129. Historically well-controlled on diet alone.  hemoglobin A1c has been consistently at or  less than 7.0 . Patient is up-to-date on eye exams and foot exam is unchanged today. Patient  Has had  urine microalbumin to creatinine ratio done within the past year. Patient is tolerating statin therapy for CAD risk reduction and on ACE/ARB for reduction in proteinuria.  Given her a1c she does not need medication but has been advised to quit smoking and start exercising.    Lab Results  Component Value Date   HGBA1C 5.9 11/08/2015   Lab Results  Component Value Date   MICROALBUR <0.7 11/08/2015

## 2015-11-10 NOTE — Assessment & Plan Note (Signed)
Well controlled on current medications .  :Liver enzymes are normal.  No changes today  Lab Results  Component Value Date   CHOL 147 11/08/2015   HDL 60.60 11/08/2015   LDLCALC 75 11/08/2015   LDLDIRECT 81.0 11/08/2015   TRIG 58.0 11/08/2015   CHOLHDL 2 11/08/2015   Lab Results  Component Value Date   ALT 12 11/08/2015   AST 18 11/08/2015   ALKPHOS 61 11/08/2015   BILITOT 0.6 11/08/2015

## 2015-11-10 NOTE — Assessment & Plan Note (Addendum)
May be du to the tramadol she is using . Bowel regimen outlined.  Lactulose q 3 days, miralax daily.

## 2015-11-10 NOTE — Assessment & Plan Note (Signed)
Historically well-controlled on diet alone.  hemoglobin A1c has been consistently at or  less than 7.0 . Patient is up-to-date on eye exams and foot exam is normal today. Patient has no proteinuria and is tolerating statin therapy for CAD risk reduction and on ACE/ARB for reduction in proteinuria.    Lab Results  Component Value Date   HGBA1C 5.9 11/08/2015   Lab Results  Component Value Date   MICROALBUR <0.7 11/08/2015

## 2015-11-10 NOTE — Assessment & Plan Note (Signed)
Thyroid function is sightly underactive on current dose.  Given her new onset constipation, will increase her dose of Synthroid to 100 mcg daily .   Lab Results  Component Value Date   TSH 4.88* 11/08/2015

## 2015-11-10 NOTE — Assessment & Plan Note (Signed)
Risks of continued tobacco use were discussed. She is not currently interested in tobacco cessation.    

## 2015-11-11 ENCOUNTER — Encounter: Payer: Self-pay | Admitting: Internal Medicine

## 2015-11-11 ENCOUNTER — Other Ambulatory Visit: Payer: Self-pay | Admitting: Internal Medicine

## 2015-11-11 MED ORDER — ERGOCALCIFEROL 1.25 MG (50000 UT) PO CAPS: 50000.0000 [IU] | ORAL_CAPSULE | ORAL | Status: DC

## 2015-11-20 ENCOUNTER — Other Ambulatory Visit: Payer: Self-pay | Admitting: Internal Medicine

## 2015-11-20 ENCOUNTER — Telehealth: Payer: Self-pay

## 2015-11-20 MED ORDER — TRAMADOL HCL 50 MG PO TABS: 50.0000 mg | ORAL_TABLET | Freq: Four times a day (QID) | ORAL | Status: DC | PRN

## 2015-11-20 NOTE — Telephone Encounter (Signed)
Medication last refilled in November. Please advise?

## 2015-11-20 NOTE — Telephone Encounter (Signed)
Pt states she is having problems going to the bathroom x4days, pain in her left side/pain level 5, pt states that she does not feel like she completely relieveing herself. She does not feel the Lactulose is working. Please advise

## 2015-11-20 NOTE — Telephone Encounter (Signed)
Is she using tramadol for pain and how often?  Is she taking the miralax every night?  Has she tried adding a stool softener called Colace to her nightly regimen?.  If not,  Have her stop the tramadol and do the things I said.  Give her an appt next available.

## 2015-11-20 NOTE — Telephone Encounter (Signed)
Notified pt of Dr. Lupita Dawn comment, pt verbalized inderstanding. Pt states that she is not using mirlax everyday and colace everyday. Pt will call back if things do not improve

## 2015-11-20 NOTE — Telephone Encounter (Signed)
LOMOTCB

## 2015-11-20 NOTE — Telephone Encounter (Signed)
Ok to refill,  Authorized in epic and printed  

## 2015-11-28 NOTE — Telephone Encounter (Signed)
Mailed unread message to [employer]

## 2015-12-03 ENCOUNTER — Other Ambulatory Visit: Payer: Self-pay

## 2015-12-03 MED ORDER — FLUTICASONE-SALMETEROL 100-50 MCG/DOSE IN AEPB: INHALATION_SPRAY | RESPIRATORY_TRACT | Status: DC

## 2016-02-05 ENCOUNTER — Other Ambulatory Visit: Payer: Self-pay | Admitting: Surgical

## 2016-02-05 MED ORDER — MELOXICAM 15 MG PO TABS: 15.0000 mg | ORAL_TABLET | Freq: Every day | ORAL | Status: DC

## 2016-02-08 ENCOUNTER — Encounter: Payer: PPO | Admitting: Internal Medicine

## 2016-02-18 ENCOUNTER — Encounter: Payer: Self-pay | Admitting: Internal Medicine

## 2016-02-18 ENCOUNTER — Ambulatory Visit (INDEPENDENT_AMBULATORY_CARE_PROVIDER_SITE_OTHER): Payer: PPO | Admitting: Internal Medicine

## 2016-02-18 VITALS — BP 148/76 | HR 74 | Temp 98.1°F | Resp 12 | Ht 64.0 in | Wt 157.2 lb

## 2016-02-18 DIAGNOSIS — Z1211 Encounter for screening for malignant neoplasm of colon: Secondary | ICD-10-CM | POA: Diagnosis not present

## 2016-02-18 DIAGNOSIS — Z1239 Encounter for other screening for malignant neoplasm of breast: Secondary | ICD-10-CM | POA: Diagnosis not present

## 2016-02-18 DIAGNOSIS — Z0000 Encounter for general adult medical examination without abnormal findings: Secondary | ICD-10-CM | POA: Diagnosis not present

## 2016-02-18 DIAGNOSIS — E1151 Type 2 diabetes mellitus with diabetic peripheral angiopathy without gangrene: Secondary | ICD-10-CM | POA: Diagnosis not present

## 2016-02-18 DIAGNOSIS — E559 Vitamin D deficiency, unspecified: Secondary | ICD-10-CM | POA: Diagnosis not present

## 2016-02-18 DIAGNOSIS — Z780 Asymptomatic menopausal state: Secondary | ICD-10-CM | POA: Diagnosis not present

## 2016-02-18 DIAGNOSIS — Z720 Tobacco use: Secondary | ICD-10-CM

## 2016-02-18 DIAGNOSIS — Z7289 Other problems related to lifestyle: Secondary | ICD-10-CM

## 2016-02-18 LAB — COMPREHENSIVE METABOLIC PANEL
ALBUMIN: 4.1 g/dL (ref 3.5–5.2)
ALT: 10 U/L (ref 0–35)
AST: 16 U/L (ref 0–37)
Alkaline Phosphatase: 53 U/L (ref 39–117)
BILIRUBIN TOTAL: 0.4 mg/dL (ref 0.2–1.2)
BUN: 17 mg/dL (ref 6–23)
CALCIUM: 9.2 mg/dL (ref 8.4–10.5)
CO2: 28 mEq/L (ref 19–32)
CREATININE: 0.7 mg/dL (ref 0.40–1.20)
Chloride: 102 mEq/L (ref 96–112)
GFR: 87.09 mL/min (ref >60.00)
Glucose, Bld: 105 mg/dL — ABNORMAL HIGH (ref 70–99)
Potassium: 4.3 mEq/L (ref 3.5–5.1)
Sodium: 136 mEq/L (ref 135–145)
Total Protein: 6.9 g/dL (ref 6.0–8.3)

## 2016-02-18 LAB — LIPID PANEL
CHOL/HDL RATIO: 2
Cholesterol: 126 mg/dL (ref 0–200)
HDL: 54.1 mg/dL (ref >39.00)
LDL Cholesterol: 63 mg/dL (ref 0–99)
NONHDL: 71.93
Triglycerides: 44 mg/dL (ref 0.0–149.0)
VLDL: 8.8 mg/dL (ref 0.0–40.0)

## 2016-02-18 LAB — MICROALBUMIN / CREATININE URINE RATIO
CREATININE, U: 95 mg/dL
Microalb Creat Ratio: 0.8 mg/g (ref 0.0–30.0)
Microalb, Ur: 0.8 mg/dL (ref 0.0–1.9)

## 2016-02-18 LAB — HEMOGLOBIN A1C: Hgb A1c MFr Bld: 6.1 % (ref 4.6–6.5)

## 2016-02-18 LAB — VITAMIN D 25 HYDROXY (VIT D DEFICIENCY, FRACTURES): VITD: 42.34 ng/mL (ref 30.00–100.00)

## 2016-02-18 NOTE — Progress Notes (Signed)
Pre-visit discussion using our clinic review tool. No additional management support is needed unless otherwise documented below in the visit note.  

## 2016-02-18 NOTE — Progress Notes (Signed)
Patient ID: [name], female    DOB: 1941/09/29  Age: 75 y.o. MRN: [id number]  The patient is here for annual Medicare wellness examination and management of other chronic and acute problems.  CT lungs discussed as screening as she is still smoking < 1 pack   has a living will,  Both daughters have  It,  Lattie Haw has healthcare POA  Has appt with Dr Lucky Cowboy for foll up carotid stenosis   BRUIT ON RIGHT piror CEA     The risk factors are reflected in the social history.  The roster of all physicians providing medical care to patient - is listed in the Snapshot section of the chart.  Activities of daily living:  The patient is 100% independent in all ADLs: dressing, toileting, feeding as well as independent mobility  Home safety : The patient has smoke detectors in the home. They wear seatbelts.  There are no firearms at home. There is no violence in the home.   There is no risks for hepatitis, STDs or HIV. There is no   history of blood transfusion. They have no travel history to infectious disease endemic areas of the world.  The patient has seen their dentist in the last six month. Had a filling placed last week and has developed a stiff neck since then.  They have seen their eye doctor in the last year. They have no hearing difficulty with regard to whispered voices and some television programs.  They have deferred audiologic testing in the last year.  They do not  have excessive sun exposure. Discussed the need for sun protection: hats, long sleeves and use of sunscreen if there is significant sun exposure.   Diet: the importance of a healthy diet is discussed. They do have a healthy diet.  The benefits of regular aerobic exercise were discussed. She is not active but is willing to walk  4 times per week ,  20 minutes.   Depression screen: there are no signs or vegative symptoms of depression- irritability, change in appetite, anhedonia, sadness/tearfullness.  Cognitive assessment: the  patient manages all their financial and personal affairs and is actively engaged. They could relate day,date,year and events; recalled 3/3 objects at 3 minutes; performed clock-face test normally.  The following portions of the patient's history were reviewed and updated as appropriate: allergies, current medications, past family history, past medical history,  past surgical history, past social history  and problem list.  Visual acuity was not assessed per patient preference since she has regular follow up with her ophthalmologist. Hearing and body mass index were assessed and reviewed.   During the course of the visit the patient was educated and counseled about appropriate screening and preventive services including : fall prevention , diabetes screening, nutrition counseling, colorectal cancer screening, and recommended immunizations.    CC: The primary encounter diagnosis was Colon cancer screening. Diagnoses of Breast cancer screening, Postmenopausal estrogen deficiency, Other problems related to lifestyle, Vitamin D deficiency, Type 2 diabetes mellitus with diabetic peripheral angiopathy without gangrene, without long-term current use of insulin ([place]), and Tobacco abuse were also pertinent to this visit.  Left sided neck pain for the past week after having a filling placed by dentist using stools oftener daily  miralax as needed  Due for 5 yr polyps,  Oh . Wants to wait unitl Fall when she retires   History Icesis has a past medical history of Hyperlipidemia; Bursitis NEC; Osteopenia (2009 ); Tobacco abuse disorder; COPD (chronic obstructive  pulmonary disease) ([place]); hypothyroid; Vitamin D deficiency (April 2011); Vitamin D deficiency (April 2011); Screening for breast cancer (May 2012); Abnormal Pap smear of cervix (2001); and History of peripheral vascular disease (2001).   She has past surgical history that includes Hernia repair (Jun 2011); Cataract extraction (Sept 2011);  Cholecystectomy; and Tubal ligation.   Her family history includes Arthritis in her mother; Diabetes in her father; Hypertension in her mother.She reports that she has been smoking Cigarettes.  She has been smoking about 1.00 pack per day. She has never used smokeless tobacco. She reports that she does not drink alcohol or use illicit drugs.  Outpatient Prescriptions Prior to Visit  Medication Sig Dispense Refill  . Cholecalciferol (VITAMIN D3) 1000 UNITS CAPS Take 1 capsule by mouth daily.    . clobetasol cream (TEMOVATE) [id number] % Apply 1 application topically 2 (two) times daily. 30 g 3  . cyanocobalamin 100 MCG tablet Take 100 mcg by mouth daily.      . ergocalciferol (DRISDOL) 50000 units capsule Take 1 capsule (50,000 Units total) by mouth once a week. 12 capsule 0  . fluticasone (FLONASE) 50 MCG/ACT nasal spray Place 2 sprays into both nostrils daily. 16 g 4  . Fluticasone-Salmeterol (ADVAIR DISKUS) 100-50 MCG/DOSE AEPB ONE PUFF EVERY 12 HOURS AS DIRECTED. RINSE MOUTH AFTER USING 60 each 5  . levothyroxine (SYNTHROID, LEVOTHROID) 100 MCG tablet Take 1 tablet (100 mcg total) by mouth daily. 90 tablet 3  . meloxicam (MOBIC) 15 MG tablet Take 1 tablet (15 mg total) by mouth daily. 30 tablet 0  . sertraline (ZOLOFT) 50 MG tablet Take 50 mg by mouth daily.      . simvastatin (ZOCOR) 20 MG tablet Take 1 tablet (20 mg total) by mouth at bedtime. 90 tablet 1  . terbinafine (LAMISIL) 250 MG tablet Take 1 tablet (250 mg total) by mouth daily. 30 tablet 2  . traMADol (ULTRAM) 50 MG tablet Take 1 tablet (50 mg total) by mouth every 6 (six) hours as needed. 120 tablet 0  . Lactulose 20 GM/30ML SOLN 30 ml every 4 hours until constipation is relieved (Patient not taking: Reported on 02/18/2016) 236 mL 3   No facility-administered medications prior to visit.    Review of Systems   Patient denies headache, fevers, malaise, unintentional weight loss, skin rash, eye pain, sinus congestion and sinus pain,  sore throat, dysphagia,  hemoptysis , cough, dyspnea, wheezing, chest pain, palpitations, orthopnea, edema, abdominal pain, nausea, melena, diarrhea, constipation, flank pain, dysuria, hematuria, urinary  Frequency, nocturia, numbness, tingling, seizures,  Focal weakness, Loss of consciousness,  Tremor, insomnia, depression, anxiety, and suicidal ideation.      Objective:  BP 148/76 mmHg  Pulse 74  Temp(Src) 98.1 F (36.7 C) (Oral)  Resp 12  Ht 5\' 4"  (1.626 m)  Wt 157 lb 4 oz (71.328 kg)  BMI 26.98 kg/m2  SpO2 95%  Physical Exam   General appearance: alert, cooperative and appears stated age Head: Normocephalic, without obvious abnormality, atraumatic Eyes: conjunctivae/corneas clear. PERRL, EOM's intact. Fundi benign. Ears: normal TM's and external ear canals both ears Nose: Nares normal. Septum midline. Mucosa normal. No drainage or sinus tenderness. Throat: lips, mucosa, and tongue normal; teeth and gums normal Neck: no adenopathy, RIGHT SIDED CAROTID BRUIT, no JVD, supple, symmetrical, trachea midline and thyroid not enlarged, symmetric, no tenderness/mass/nodules Lungs: clear to auscultation bilaterally Breasts: normal appearance, no masses or tenderness Heart: regular rate and rhythm, S1, S2 normal, no murmur, click, rub or gallop  Abdomen: soft, non-tender; bowel sounds normal; no masses,  no organomegaly Extremities: extremities normal, atraumatic, no cyanosis or edema Pulses: 2+ and symmetric Skin: Skin color, texture, turgor normal. No rashes or lesions Neurologic: Alert and oriented X 3, normal strength and tone. Normal symmetric reflexes. Normal coordination and gait.     Assessment & Plan:   Problem List Items Addressed This Visit    Vitamin D deficiency   Relevant Orders   VITAMIN D 25 Hydroxy (Vit-D Deficiency, Fractures)    Other Visit Diagnoses    Colon cancer screening    -  Primary    Relevant Orders    Ambulatory referral to Gastroenterology    Breast  cancer screening        Relevant Orders    MM DIGITAL SCREENING BILATERAL    Postmenopausal estrogen deficiency        Relevant Orders    DG Bone Density    Other problems related to lifestyle        Relevant Orders    Hepatitis C antibody    HIV antibody    Type 2 diabetes mellitus with diabetic peripheral angiopathy without gangrene, without long-term current use of insulin (H[facility])        Relevant Orders    Comprehensive metabolic panel    Hemoglobin A1c    Microalbumin / creatinine urine ratio    Lipid panel    Tobacco abuse        Relevant Orders    CT CHEST LUNG CA SCREEN LOW DOSE W/O CM       I am having Ms. Aramburo maintain her sertraline, cyanocobalamin, Vitamin D3, clobetasol cream, fluticasone, terbinafine, simvastatin, Lactulose, levothyroxine, ergocalciferol, traMADol, Fluticasone-Salmeterol, and meloxicam.  No orders of the defined types were placed in this encounter.    There are no discontinued medications.  Follow-up: Return in about 3 months (around 05/20/2016).   Crecencio Mc, MD

## 2016-02-18 NOTE — Patient Instructions (Addendum)
Please start taking a baby aspirin daily  Please continue to reduce your daily tobacco use.     Menopause is a normal process in which your reproductive ability comes to an end. This process happens gradually over a span of months to years, usually between the ages of 68 and 58. Menopause is complete when you have missed 12 consecutive menstrual periods. It is important to talk with your health care provider about some of the most common conditions that affect postmenopausal women, such as heart disease, cancer, and bone loss (osteoporosis). Adopting a healthy lifestyle and getting preventive care can help to promote your health and wellness. Those actions can also lower your chances of developing some of these common conditions. WHAT SHOULD I KNOW ABOUT MENOPAUSE? During menopause, you may experience a number of symptoms, such as:  Moderate-to-severe hot flashes.  Night sweats.  Decrease in sex drive.  Mood swings.  Headaches.  Tiredness.  Irritability.  Memory problems.  Insomnia. Choosing to treat or not to treat menopausal changes is an individual decision that you make with your health care provider. WHAT SHOULD I KNOW ABOUT HORMONE REPLACEMENT THERAPY AND SUPPLEMENTS? Hormone therapy products are effective for treating symptoms that are associated with menopause, such as hot flashes and night sweats. Hormone replacement carries certain risks, especially as you become older. If you are thinking about using estrogen or estrogen with progestin treatments, discuss the benefits and risks with your health care provider. WHAT SHOULD I KNOW ABOUT HEART DISEASE AND STROKE? Heart disease, heart attack, and stroke become more likely as you age. This may be due, in part, to the hormonal changes that your body experiences during menopause. These can affect how your body processes dietary fats, triglycerides, and cholesterol. Heart attack and stroke are both medical emergencies. There are  many things that you can do to help prevent heart disease and stroke:  Have your blood pressure checked at least every 1-2 years. High blood pressure causes heart disease and increases the risk of stroke.  If you are 68-17 years old, ask your health care provider if you should take aspirin to prevent a heart attack or a stroke.  Do not use any tobacco products, including cigarettes, chewing tobacco, or electronic cigarettes. If you need help quitting, ask your health care provider.  It is important to eat a healthy diet and maintain a healthy weight.  Be sure to include plenty of vegetables, fruits, low-fat dairy products, and lean protein.  Avoid eating foods that are high in solid fats, added sugars, or salt (sodium).  Get regular exercise. This is one of the most important things that you can do for your health.  Try to exercise for at least 150 minutes each week. The type of exercise that you do should increase your heart rate and make you sweat. This is known as moderate-intensity exercise.  Try to do strengthening exercises at least twice each week. Do these in addition to the moderate-intensity exercise.  Know your numbers.Ask your health care provider to check your cholesterol and your blood glucose. Continue to have your blood tested as directed by your health care provider. WHAT SHOULD I KNOW ABOUT CANCER SCREENING? There are several types of cancer. Take the following steps to reduce your risk and to catch any cancer development as early as possible. Breast Cancer  Practice breast self-awareness.  This means understanding how your breasts normally appear and feel.  It also means doing regular breast self-exams. Let your health care provider  know about any changes, no matter how small.  If you are 21 or older, have a clinician do a breast exam (clinical breast exam or CBE) every year. Depending on your age, family history, and medical history, it may be recommended that you  also have a yearly breast X-ray (mammogram).  If you have a family history of breast cancer, talk with your health care provider about genetic screening.  If you are at high risk for breast cancer, talk with your health care provider about having an MRI and a mammogram every year.  Breast cancer (BRCA) gene test is recommended for women who have family members with BRCA-related cancers. Results of the assessment will determine the need for genetic counseling and BRCA1 and for BRCA2 testing. BRCA-related cancers include these types:  Breast. This occurs in males or females.  Ovarian.  Tubal. This may also be called fallopian tube cancer.  Cancer of the abdominal or pelvic lining (peritoneal cancer).  Prostate.  Pancreatic. Cervical, Uterine, and Ovarian Cancer Your health care provider may recommend that you be screened regularly for cancer of the pelvic organs. These include your ovaries, uterus, and vagina. This screening involves a pelvic exam, which includes checking for microscopic changes to the surface of your cervix (Pap test).  For women ages 21-65, health care providers may recommend a pelvic exam and a Pap test every three years. For women ages 81-65, they may recommend the Pap test and pelvic exam, combined with testing for human papilloma virus (HPV), every five years. Some types of HPV increase your risk of cervical cancer. Testing for HPV may also be done on women of any age who have unclear Pap test results.  Other health care providers may not recommend any screening for nonpregnant women who are considered low risk for pelvic cancer and have no symptoms. Ask your health care provider if a screening pelvic exam is right for you.  If you have had past treatment for cervical cancer or a condition that could lead to cancer, you need Pap tests and screening for cancer for at least 20 years after your treatment. If Pap tests have been discontinued for you, your risk factors (such  as having a new sexual partner) need to be reassessed to determine if you should start having screenings again. Some women have medical problems that increase the chance of getting cervical cancer. In these cases, your health care provider may recommend that you have screening and Pap tests more often.  If you have a family history of uterine cancer or ovarian cancer, talk with your health care provider about genetic screening.  If you have vaginal bleeding after reaching menopause, tell your health care provider.  There are currently no reliable tests available to screen for ovarian cancer. Lung Cancer Lung cancer screening is recommended for adults 37-26 years old who are at high risk for lung cancer because of a history of smoking. A yearly low-dose CT scan of the lungs is recommended if you:  Currently smoke.  Have a history of at least 30 pack-years of smoking and you currently smoke or have quit within the past 15 years. A pack-year is smoking an average of one pack of cigarettes per day for one year. Yearly screening should:  Continue until it has been 15 years since you quit.  Stop if you develop a health problem that would prevent you from having lung cancer treatment. Colorectal Cancer  This type of cancer can be detected and can often be  prevented.  Routine colorectal cancer screening usually begins at age 72 and continues through age 82.  If you have risk factors for colon cancer, your health care provider may recommend that you be screened at an earlier age.  If you have a family history of colorectal cancer, talk with your health care provider about genetic screening.  Your health care provider may also recommend using home test kits to check for hidden blood in your stool.  A small camera at the end of a tube can be used to examine your colon directly (sigmoidoscopy or colonoscopy). This is done to check for the earliest forms of colorectal cancer.  Direct examination  of the colon should be repeated every 5-10 years until age 44. However, if early forms of precancerous polyps or small growths are found or if you have a family history or genetic risk for colorectal cancer, you may need to be screened more often. Skin Cancer  Check your skin from head to toe regularly.  Monitor any moles. Be sure to tell your health care provider:  About any new moles or changes in moles, especially if there is a change in a mole's shape or color.  If you have a mole that is larger than the size of a pencil eraser.  If any of your family members has a history of skin cancer, especially at a young age, talk with your health care provider about genetic screening.  Always use sunscreen. Apply sunscreen liberally and repeatedly throughout the day.  Whenever you are outside, protect yourself by wearing long sleeves, pants, a wide-brimmed hat, and sunglasses. WHAT SHOULD I KNOW ABOUT OSTEOPOROSIS? Osteoporosis is a condition in which bone destruction happens more quickly than new bone creation. After menopause, you may be at an increased risk for osteoporosis. To help prevent osteoporosis or the bone fractures that can happen because of osteoporosis, the following is recommended:  If you are 76-33 years old, get at least 1,000 mg of calcium and at least 600 mg of vitamin D per day.  If you are older than age 73 but younger than age 89, get at least 1,200 mg of calcium and at least 600 mg of vitamin D per day.  If you are older than age 52, get at least 1,200 mg of calcium and at least 800 mg of vitamin D per day. Smoking and excessive alcohol intake increase the risk of osteoporosis. Eat foods that are rich in calcium and vitamin D, and do weight-bearing exercises several times each week as directed by your health care provider. WHAT SHOULD I KNOW ABOUT HOW MENOPAUSE AFFECTS [place]? Depression may occur at any age, but it is more common as you become older. Common  symptoms of depression include:  Low or sad mood.  Changes in sleep patterns.  Changes in appetite or eating patterns.  Feeling an overall lack of motivation or enjoyment of activities that you previously enjoyed.  Frequent crying spells. Talk with your health care provider if you think that you are experiencing depression. WHAT SHOULD I KNOW ABOUT IMMUNIZATIONS? It is important that you get and maintain your immunizations. These include:  Tetanus, diphtheria, and pertussis (Tdap) booster vaccine.  Influenza every year before the flu season begins.  Pneumonia vaccine.  Shingles vaccine. Your health care provider may also recommend other immunizations.   This information is not intended to replace advice given to you by your health care provider. Make sure you discuss any questions you have with your  health care provider.   Document Released: 11/21/2005 Document Revised: 10/20/2014 Document Reviewed: 06/01/2014 Elsevier Interactive Patient Education [employer].

## 2016-02-19 LAB — HIV ANTIBODY (ROUTINE TESTING W REFLEX): HIV: NONREACTIVE

## 2016-02-19 LAB — HEPATITIS C ANTIBODY: HCV AB: NEGATIVE

## 2016-02-20 ENCOUNTER — Encounter: Payer: Self-pay | Admitting: *Deleted

## 2016-02-25 ENCOUNTER — Telehealth: Payer: Self-pay | Admitting: *Deleted

## 2016-02-25 NOTE — Telephone Encounter (Signed)
Left a voicemail message for patient letting them know their doctor has referred them to have a low dose lung cancer screening scan. Requested they return my call to initiate the screening process.

## 2016-02-26 ENCOUNTER — Other Ambulatory Visit: Payer: Self-pay | Admitting: Internal Medicine

## 2016-02-26 DIAGNOSIS — F17200 Nicotine dependence, unspecified, uncomplicated: Secondary | ICD-10-CM | POA: Diagnosis not present

## 2016-02-26 DIAGNOSIS — E785 Hyperlipidemia, unspecified: Secondary | ICD-10-CM | POA: Diagnosis not present

## 2016-02-26 DIAGNOSIS — J449 Chronic obstructive pulmonary disease, unspecified: Secondary | ICD-10-CM | POA: Diagnosis not present

## 2016-02-26 DIAGNOSIS — I6529 Occlusion and stenosis of unspecified carotid artery: Secondary | ICD-10-CM | POA: Diagnosis not present

## 2016-02-26 DIAGNOSIS — I6523 Occlusion and stenosis of bilateral carotid arteries: Secondary | ICD-10-CM | POA: Diagnosis not present

## 2016-02-26 MED ORDER — SIMVASTATIN 20 MG PO TABS: 20.0000 mg | ORAL_TABLET | Freq: Every day | ORAL | Status: DC

## 2016-02-29 ENCOUNTER — Ambulatory Visit (INDEPENDENT_AMBULATORY_CARE_PROVIDER_SITE_OTHER): Payer: PPO | Admitting: Family Medicine

## 2016-02-29 ENCOUNTER — Encounter: Payer: Self-pay | Admitting: Family Medicine

## 2016-02-29 VITALS — BP 156/80 | HR 83 | Temp 98.2°F | Ht 65.0 in | Wt 158.2 lb

## 2016-02-29 DIAGNOSIS — I10 Essential (primary) hypertension: Secondary | ICD-10-CM | POA: Insufficient documentation

## 2016-02-29 DIAGNOSIS — R030 Elevated blood-pressure reading, without diagnosis of hypertension: Secondary | ICD-10-CM | POA: Diagnosis not present

## 2016-02-29 DIAGNOSIS — W57XXXA Bitten or stung by nonvenomous insect and other nonvenomous arthropods, initial encounter: Secondary | ICD-10-CM | POA: Diagnosis not present

## 2016-02-29 DIAGNOSIS — T148 Other injury of unspecified body region: Secondary | ICD-10-CM

## 2016-02-29 DIAGNOSIS — IMO0001 Reserved for inherently not codable concepts without codable children: Secondary | ICD-10-CM

## 2016-02-29 MED ORDER — DOXYCYCLINE HYCLATE 100 MG PO TABS: 100.0000 mg | ORAL_TABLET | Freq: Two times a day (BID) | ORAL | Status: DC

## 2016-02-29 NOTE — Progress Notes (Signed)
Pre visit review using our clinic review tool, if applicable. No additional management support is needed unless otherwise documented below in the visit note. 

## 2016-02-29 NOTE — Patient Instructions (Signed)
Take the doxycycline as prescribed.  Take with food.  Please follow up with your PCP soon regarding your BP.  Take care  Dr. Lacinda Axon

## 2016-02-29 NOTE — Assessment & Plan Note (Signed)
New problem. Patient with 2 bite with surrounding erythema consistent with cellulitis. Treating with doxycycline.

## 2016-02-29 NOTE — Progress Notes (Signed)
   Subjective:  Patient ID: Jane Davidson, female    DOB: May 04, 1941  Age: 75 y.o. MRN: [id number]  CC: Tick bite  HPI:  75 year old female presents with the above complaint.  Patient states she awoke Tuesday morning and noticed a tick on her right arm. She removed the tick without difficulty. She subsequently developed focal area of redness. No recent nausea, vomiting. No chills. She states that she saw a nurse at her work and was told that she has a fever. She is afebrile today. No other rash noted. She otherwise feels well. No other complaints today. No medications or interventions tried. No known exacerbating or relieving factors.  Social Hx   Social History   Social History  . Marital Status: Widowed    Spouse Name: N/A  . Number of Children: N/A  . Years of Education: N/A   Social History Main Topics  . Smoking status: Current Every Day Smoker -- 1.00 packs/day    Types: Cigarettes  . Smokeless tobacco: Never Used  . Alcohol Use: No  . Drug Use: No  . Sexual Activity: No   Other Topics Concern  . None   Social History Narrative   Review of Systems  Constitutional: Negative for chills.       ? Fever; Afebrile today.  Skin:       Tick bite.   Objective:  BP 156/80 mmHg  Pulse 83  Temp(Src) 98.2 F (36.8 C) (Oral)  Ht 5\' 5"  (1.651 m)  Wt 158 lb 3.2 oz (71.759 kg)  BMI 26.33 kg/m2  SpO2 97%  BP/Weight 02/29/2016 02/18/2016 0000000  Systolic BP [id number] 0000000  Diastolic BP 80 76 82  Wt. (Lbs) 158.2 157.25 163.25  BMI 26.33 26.98 27.8   Physical Exam  Constitutional: She is oriented to person, place, and time. She appears well-developed. No distress.  Pulmonary/Chest: Effort normal.  Neurological: She is alert and oriented to person, place, and time.  Skin:  R arm/just above anticubital fossa - 1.5 cm raised area with surrounding erythema. No drainage.  Psychiatric: She has a normal mood and affect.  Vitals reviewed.   Lab Results  Component Value Date    WBC 18.7* 11/03/2014   HGB 11.3* 11/03/2014   HCT 34.7* 11/03/2014   PLT 259 11/03/2014   GLUCOSE 105* 02/18/2016   CHOL 126 02/18/2016   TRIG 44.0 02/18/2016   HDL 54.10 02/18/2016   LDLDIRECT 81.0 11/08/2015   LDLCALC 63 02/18/2016   ALT 10 02/18/2016   AST 16 02/18/2016   NA 136 02/18/2016   K 4.3 02/18/2016   CL 102 02/18/2016   CREATININE 0.70 02/18/2016   BUN 17 02/18/2016   CO2 28 02/18/2016   TSH 4.88* 11/08/2015   INR 1.0 11/03/2014   HGBA1C 6.1 02/18/2016   MICROALBUR 0.8 02/18/2016   Assessment & Plan:   Problem List Items Addressed This Visit    Tick bite - Primary    New problem. Patient with 2 bite with surrounding erythema consistent with cellulitis. Treating with doxycycline.      Elevated BP    BP elevated today and on repeat. Patient has no history of this. Recommended reevaluation 1-2 weeks.        Follow-up: 1-2 weeks with PCP or Nurse visit  [place]

## 2016-02-29 NOTE — Assessment & Plan Note (Signed)
BP elevated today and on repeat. Patient has no history of this. Recommended reevaluation 1-2 weeks.

## 2016-04-03 ENCOUNTER — Telehealth: Payer: Self-pay | Admitting: *Deleted

## 2016-04-03 NOTE — Telephone Encounter (Signed)
Received referral for initial lung cancer screening scan. Contacted patient and obtained smoking history,(current smoker, 30 pack year history) as well as answering questions related to screening process. Patient denies signs of lung cancer such as weight loss or hemoptysis. Patient denies comorbidity that would prevent curative treatment if lung cancer were found. Patient has scheduling conflicts and will be contacted when shared decision making visit and scan can be coordinated within the patient's availability.

## 2016-04-09 DIAGNOSIS — F3342 Major depressive disorder, recurrent, in full remission: Secondary | ICD-10-CM | POA: Diagnosis not present

## 2016-04-16 ENCOUNTER — Telehealth: Payer: Self-pay | Admitting: *Deleted

## 2016-04-16 NOTE — Telephone Encounter (Signed)
Received referral for initial lung cancer screening scan. Contacted patient and obtained smoking history,(current 30 pack year) as well as answering questions related to screening process. Patient denies signs of lung cancer such as weight loss or hemoptysis. Patient denies comorbidity that would prevent curative treatment if lung cancer were found. Patient is tentatively scheduled for shared decision making visit and CT scan on 04/22/16 at 3:30pm, pending insurance approval from business office.

## 2016-04-17 ENCOUNTER — Telehealth: Payer: Self-pay | Admitting: Internal Medicine

## 2016-04-17 ENCOUNTER — Other Ambulatory Visit: Payer: Self-pay | Admitting: *Deleted

## 2016-04-17 MED ORDER — FLUTICASONE PROPIONATE 50 MCG/ACT NA SUSP: 2.0000 | Freq: Every day | NASAL | Status: DC

## 2016-04-17 MED ORDER — TRAMADOL HCL 50 MG PO TABS: 50.0000 mg | ORAL_TABLET | Freq: Four times a day (QID) | ORAL | Status: DC | PRN

## 2016-04-17 NOTE — Telephone Encounter (Addendum)
Request to refill tramadol received from pharmacy . Last filled 2/17 for 120 ok to fill last visit 02/18/16 and refill for Mobic.

## 2016-04-17 NOTE — Telephone Encounter (Signed)
Tramadol refilled.

## 2016-04-18 NOTE — Telephone Encounter (Signed)
Faxed

## 2016-05-09 ENCOUNTER — Telehealth: Payer: Self-pay | Admitting: *Deleted

## 2016-05-09 NOTE — Telephone Encounter (Signed)
Provider is out of the office, last seen for an acute visit with Dr. Lacinda Axon, please advise,or does she need an appointment?

## 2016-05-09 NOTE — Telephone Encounter (Signed)
LVM to call office to schedule appt

## 2016-05-09 NOTE — Telephone Encounter (Signed)
Pt stated that she has a runny nose, cough with clear mucus. She has requested a [employer]

## 2016-05-09 NOTE — Telephone Encounter (Signed)
Needs to be seen

## 2016-05-09 NOTE — Telephone Encounter (Signed)
Patient needs an appt, thanks

## 2016-05-20 ENCOUNTER — Ambulatory Visit: Payer: PPO | Admitting: Internal Medicine

## 2016-05-23 ENCOUNTER — Encounter: Payer: Self-pay | Admitting: Internal Medicine

## 2016-05-23 ENCOUNTER — Ambulatory Visit (INDEPENDENT_AMBULATORY_CARE_PROVIDER_SITE_OTHER): Payer: PPO | Admitting: Internal Medicine

## 2016-05-23 ENCOUNTER — Encounter (INDEPENDENT_AMBULATORY_CARE_PROVIDER_SITE_OTHER): Payer: Self-pay

## 2016-05-23 VITALS — BP 142/68 | HR 83 | Temp 98.2°F | Ht 65.0 in | Wt 158.4 lb

## 2016-05-23 DIAGNOSIS — E034 Atrophy of thyroid (acquired): Secondary | ICD-10-CM

## 2016-05-23 DIAGNOSIS — E038 Other specified hypothyroidism: Secondary | ICD-10-CM

## 2016-05-23 DIAGNOSIS — I10 Essential (primary) hypertension: Secondary | ICD-10-CM

## 2016-05-23 DIAGNOSIS — E785 Hyperlipidemia, unspecified: Secondary | ICD-10-CM | POA: Diagnosis not present

## 2016-05-23 DIAGNOSIS — M8580 Other specified disorders of bone density and structure, unspecified site: Secondary | ICD-10-CM

## 2016-05-23 DIAGNOSIS — E119 Type 2 diabetes mellitus without complications: Secondary | ICD-10-CM

## 2016-05-23 DIAGNOSIS — J441 Chronic obstructive pulmonary disease with (acute) exacerbation: Secondary | ICD-10-CM

## 2016-05-23 MED ORDER — MELOXICAM 15 MG PO TABS: 15.0000 mg | ORAL_TABLET | Freq: Every day | ORAL | 4 refills | Status: DC

## 2016-05-23 MED ORDER — CLOBETASOL PROPIONATE 0.05 % EX OINT: 1.0000 "application " | TOPICAL_OINTMENT | Freq: Two times a day (BID) | CUTANEOUS | 5 refills | Status: AC

## 2016-05-23 MED ORDER — CLOBETASOL PROPIONATE 0.05 % EX OINT: 1.0000 "application " | TOPICAL_OINTMENT | Freq: Two times a day (BID) | CUTANEOUS | 5 refills | Status: DC

## 2016-05-23 NOTE — Progress Notes (Signed)
Subjective:  Patient ID: Jane Davidson, female    DOB: [birth year]  Age: 75 y.o. MRN: [id number]  CC: The primary encounter diagnosis was Diabetes mellitus without complication ([place]). Diagnoses of Hypothyroidism due to acquired atrophy of thyroid, Osteopenia, Hyperlipidemia, COPD exacerbation ([place]), and Essential hypertension were also pertinent to this visit.  HPI [name] presents for follow up on issues including  Type 2 DM, with hyperlipidemia and hypertension,  LP vs Eczema and COPD   She has been using clobetasol cream for  management of pruritic placque on volar surface of wrists. With  good results.  Concerned about the cost,  Her last refill was $45 (probably a 90 day supply)  Has been managing symptoms of URI  Since July 14 beach trip ,  Managed with robitussin ,  Still has a cough , no fevers , wheezing or purulent sputum.   Type 2 DM:  Following a prudent diet  Does not check sugars.  Walking 3 times a week for 30 minutes.     Lab Results  Component Value Date   HGBA1C 6.1 02/18/2016     Outpatient Medications Prior to Visit  Medication Sig Dispense Refill  . Cholecalciferol (VITAMIN D3) 1000 UNITS CAPS Take 1 capsule by mouth daily.    . cyanocobalamin 100 MCG tablet Take 100 mcg by mouth daily.      Marland Kitchen doxycycline (VIBRA-TABS) 100 MG tablet Take 1 tablet (100 mg total) by mouth 2 (two) times daily. 20 tablet 0  . ergocalciferol (DRISDOL) 50000 units capsule Take 1 capsule (50,000 Units total) by mouth once a week. 12 capsule 0  . fluticasone (FLONASE) 50 MCG/ACT nasal spray Place 2 sprays into both nostrils daily. 16 g 4  . Fluticasone-Salmeterol (ADVAIR DISKUS) 100-50 MCG/DOSE AEPB ONE PUFF EVERY 12 HOURS AS DIRECTED. RINSE MOUTH AFTER USING 60 each 5  . Lactulose 20 GM/30ML SOLN 30 ml every 4 hours until constipation is relieved 236 mL 3  . levothyroxine (SYNTHROID, LEVOTHROID) 100 MCG tablet Take 1 tablet (100 mcg total) by mouth daily. 90 tablet 3  .  sertraline (ZOLOFT) 50 MG tablet Take 50 mg by mouth daily.      . simvastatin (ZOCOR) 20 MG tablet Take 1 tablet (20 mg total) by mouth at bedtime. 90 tablet 3  . terbinafine (LAMISIL) 250 MG tablet Take 1 tablet (250 mg total) by mouth daily. 30 tablet 2  . traMADol (ULTRAM) 50 MG tablet Take 1 tablet (50 mg total) by mouth every 6 (six) hours as needed. 120 tablet 0  . clobetasol cream (TEMOVATE) [id number] % Apply 1 application topically 2 (two) times daily. 30 g 3  . meloxicam (MOBIC) 15 MG tablet Take 1 tablet (15 mg total) by mouth daily. 30 tablet 0   No facility-administered medications prior to visit.     Review of Systems;  Patient denies headache, fevers, malaise, unintentional weight loss, skin rash, eye pain, sinus congestion and sinus pain, sore throat, dysphagia,  hemoptysis , cough, dyspnea, wheezing, chest pain, palpitations, orthopnea, edema, abdominal pain, nausea, melena, diarrhea, constipation, flank pain, dysuria, hematuria, urinary  Frequency, nocturia, numbness, tingling, seizures,  Focal weakness, Loss of consciousness,  Tremor, insomnia, depression, anxiety, and suicidal ideation.      Objective:  BP (!) 142/68 (BP Location: Right Arm, Patient Position: Sitting, Cuff Size: Normal)   Pulse 83   Temp 98.2 F (36.8 C) (Oral)   Ht 5\' 5"  (1.651 m)   Wt 158 lb 6.4 oz (  71.8 kg)   SpO2 97%   BMI 26.36 kg/m   BP Readings from Last 3 Encounters:  05/23/16 (!) 142/68  02/29/16 (!) 156/80  02/18/16 (!) 148/76    Wt Readings from Last 3 Encounters:  05/23/16 158 lb 6.4 oz (71.8 kg)  02/29/16 158 lb 3.2 oz (71.8 kg)  02/18/16 157 lb 4 oz (71.3 kg)    General appearance: alert, cooperative and appears stated age Ears: normal TM's and external ear canals both ears Throat: lips, mucosa, and tongue normal; teeth and gums normal Neck: no adenopathy, no carotid bruit, supple, symmetrical, trachea midline and thyroid not enlarged, symmetric, no tenderness/mass/nodules Back:  symmetric, no curvature. ROM normal. No CVA tenderness. Lungs: clear to auscultation bilaterally Heart: regular rate and rhythm, S1, S2 normal, no murmur, click, rub or gallop Abdomen: soft, non-tender; bowel sounds normal; no masses,  no organomegaly Pulses: 2+ and symmetric Skin: Skin color, texture, turgor normal. No rashes or lesions Lymph nodes: Cervical, supraclavicular, and axillary nodes normal.  Lab Results  Component Value Date   HGBA1C 6.1 02/18/2016   HGBA1C 5.9 11/08/2015   HGBA1C 5.9 03/30/2015    Lab Results  Component Value Date   CREATININE 0.70 02/18/2016   CREATININE 0.81 11/08/2015   CREATININE 0.78 03/30/2015    Lab Results  Component Value Date   WBC 18.7 (H) 11/03/2014   HGB 11.3 (L) 11/03/2014   HCT 34.7 (L) 11/03/2014   PLT 259 11/03/2014   GLUCOSE 105 (H) 02/18/2016   CHOL 126 02/18/2016   TRIG 44.0 02/18/2016   HDL 54.10 02/18/2016   LDLDIRECT 81.0 11/08/2015   LDLCALC 63 02/18/2016   ALT 10 02/18/2016   AST 16 02/18/2016   NA 136 02/18/2016   K 4.3 02/18/2016   CL 102 02/18/2016   CREATININE 0.70 02/18/2016   BUN 17 02/18/2016   CO2 28 02/18/2016   TSH 4.88 (H) 11/08/2015   INR 1.0 11/03/2014   HGBA1C 6.1 02/18/2016   MICROALBUR 0.8 02/18/2016    Mr Lumbar Spine Wo Contrast  Result Date: 07/18/2015 CLINICAL DATA:  Pain in the right hip in groin region for 3 months duration. Occasional extension down the back of the right leg. [place] in May of this year. EXAM: MRI LUMBAR SPINE WITHOUT CONTRAST TECHNIQUE: Multiplanar, multisequence MR imaging of the lumbar spine was performed. No intravenous contrast was administered. COMPARISON:  None. FINDINGS: T11-12 and T12-L1: Desiccation and bulging of the discs. No apparent neural compression. The distal cord and conus are normal with the conus tip at lower L1. L1-2: Endplate osteophytes and bulging of the disc more prominent towards the left. Indentation of the thecal sac without visible neural  compression. L2-3: Retrolisthesis of 2 mm. Circumferential bulging of the disc. Bilateral facet and ligamentous hypertrophy. Stenosis of both lateral recesses that could cause neural compression. L3-4: Bulging of the disc. Bilateral facet and ligamentous hypertrophy. Stenosis of both lateral recesses that could cause neural compression. L4-5: Circumferential bulging of the disc. Bilateral facet degeneration and hypertrophy. Stenosis of both lateral recesses and foramina that could cause neural compression. L5-S1: Advanced bilateral facet arthropathy with anterolisthesis of 1 cm. Desiccation of the disc. No central canal stenosis. No compressive foraminal stenosis demonstrated. 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 Foraminal stenosis bilaterally at L4-5 could also be symptomatic. Advanced bilateral facet arthropathy at L5-S1 with 1 cm of anterolisthesis.  No visible neural compression however. Electronically Signed   By: Tuazon Chimes M.D.   On: 07/18/2015 13:02    Assessment & Plan:   Problem List Items Addressed This Visit    COPD exacerbation ([place])    Mild,  No whweezing.  Doxycycline prescribed       Diabetes mellitus without complication ([place]) - Primary    Historically well-controlled on diet alone.  hemoglobin A1c has been consistently at or  less than 7.0 checked every 6 months  . Patient is up-to-date on eye exams and foot exam is normal today. Patient has no proteinuria and is tolerating statin therapy for CAD risk reduction.  BP has been elevated on the last several visits,  Given her ongoing tobacco abuse, will recommended daily aspirin  81 mg and initiation of low dose losartan .  Lab Results  Component Value Date   HGBA1C 6.1 02/18/2016   Lab Results  Component Value Date   MICROALBUR 0.8 02/18/2016         Relevant Medications    losartan (COZAAR) 50 MG tablet   Other Relevant Orders   Hemoglobin A1c   Comprehensive metabolic panel   Urine Microalbumin w/creat. ratio   Essential hypertension    Will recommend starting losartan       Relevant Medications   losartan (COZAAR) 50 MG tablet   Hyperlipidemia   Relevant Medications   losartan (COZAAR) 50 MG tablet   Other Relevant Orders   LDL cholesterol, direct   Hypothyroidism   Relevant Orders   TSH   Osteopenia    Other Visit Diagnoses   None.     I have discontinued Ms. Spilker's clobetasol cream. I am also having her start on losartan. Additionally, I am having her maintain her sertraline, cyanocobalamin, Vitamin D3, terbinafine, Lactulose, levothyroxine, ergocalciferol, Fluticasone-Salmeterol, simvastatin, doxycycline, fluticasone, traMADol, clobetasol ointment, and meloxicam.  Meds ordered this encounter  Medications  . DISCONTD: clobetasol ointment (TEMOVATE) 0.05 %    Sig: Apply 1 application topically 2 (two) times daily.    Dispense:  30 g    Refill:  5  . clobetasol ointment (TEMOVATE) 0.05 %    Sig: Apply 1 application topically 2 (two) times daily.    Dispense:  30 g    Refill:  5  . meloxicam (MOBIC) 15 MG tablet    Sig: Take 1 tablet (15 mg total) by mouth daily.    Dispense:  30 tablet    Refill:  4  . losartan (COZAAR) 50 MG tablet    Sig: Take 1 tablet (50 mg total) by mouth daily.    Dispense:  30 tablet    Refill:  5    Medications Discontinued During This Encounter  Medication Reason  . clobetasol cream (TEMOVATE) 0.05 %   . clobetasol ointment (TEMOVATE) 0.05 % Reorder  . meloxicam (MOBIC) 15 MG tablet Reorder    Follow-up: Return in about 3 months (around 08/23/2016) for follow up diabetes.   Crecencio Mc, MD

## 2016-05-23 NOTE — Progress Notes (Signed)
Pre visit review using our clinic review tool, if applicable. No additional management support is needed unless otherwise documented below in the visit note. 

## 2016-05-23 NOTE — Patient Instructions (Addendum)
I am treating your with doxycycline for a week to resolve your cough.  Please take a probiotic ( Align, Floraque or Culturelle), the generic version of one of these over the counter medications, or an alternative form (kombucha,  Yogurt, or another dietary source) for a minimum of 3 weeks to prevent a serious antibiotic associated diarrhea  Called clostridium dificile colitis.  Taking a probiotic may also prevent vaginitis due to yeast infections and can be continued indefinitely if you feel that it improves your digestion or your elimination (bowels).     I have given you refill on the clobetasol ointment.  The price you paid may be for a 90 day supply,  But you can look for a cheaper price online using the card I gave you   Please return in 3 months for diabetes follow up.  And you can schedule fasting labs prior to your visit

## 2016-05-25 ENCOUNTER — Encounter: Payer: Self-pay | Admitting: Internal Medicine

## 2016-05-25 MED ORDER — LOSARTAN POTASSIUM 50 MG PO TABS: 50.0000 mg | ORAL_TABLET | Freq: Every day | ORAL | 5 refills | Status: DC

## 2016-05-25 NOTE — Assessment & Plan Note (Signed)
Historically well-controlled on diet alone.  hemoglobin A1c has been consistently at or  less than 7.0 checked every 6 months  . Patient is up-to-date on eye exams and foot exam is normal today. Patient has no proteinuria and is tolerating statin therapy for CAD risk reduction.  BP has been elevated on the last several visits,  Given her ongoing tobacco abuse, will recommended daily aspirin  81 mg and initiation of low dose losartan .  Lab Results  Component Value Date   HGBA1C 6.1 02/18/2016   Lab Results  Component Value Date   MICROALBUR 0.8 02/18/2016

## 2016-05-25 NOTE — Assessment & Plan Note (Signed)
Mild,  No whweezing.  Doxycycline prescribed

## 2016-05-25 NOTE — Assessment & Plan Note (Signed)
Will recommend starting losartan

## 2016-05-25 NOTE — Telephone Encounter (Signed)
Recently seen for follow up  Noticed that her last 3 BPs have been elevated.  I would like her to start taking a medication to lower her blood pressure and protect her kidneys,  Called losartan,  One tablet daily ,  rx sent  Also needs to start taking a baby  aspirin daily bc of her risk for heart attack being elevated due to diabetes and tobacco abuse .  Return in one week  (after starting losartan ) for  Fasting labs instead of waiting 3 months.

## 2016-05-26 NOTE — Telephone Encounter (Signed)
Spoke with patient, reviewed PCP note. Agreed to Aspirin and Losartan.  Scheduled for August 23rd for repeat labs. thanks

## 2016-05-26 NOTE — Telephone Encounter (Signed)
Left a message to return a call to me regarding PCP's message. thanks

## 2016-05-26 NOTE — Telephone Encounter (Signed)
Ms. Blackwood returned Tanya's call but Lavella Lemons was with a patient. Ms. Strid would like a return phone call.  Pt's ph# [phone] Thank you.

## 2016-05-27 ENCOUNTER — Telehealth: Payer: Self-pay | Admitting: *Deleted

## 2016-05-27 NOTE — Telephone Encounter (Signed)
Received referral for low dose lung cancer screening CT scan. Voicemail left at phone number listed in EMR for patient to call me back to facilitate scheduling scan.  

## 2016-05-29 ENCOUNTER — Telehealth: Payer: Self-pay | Admitting: *Deleted

## 2016-05-29 NOTE — Telephone Encounter (Signed)
Received referral for initial lung cancer screening scan. Contacted patient and obtained smoking history,(current, 30pack year) as well as answering questions related to screening process. Patient denies signs of lung cancer such as weight loss or hemoptysis. Patient denies comorbidity that would prevent curative treatment if lung cancer were found. Patient is tentatively scheduled for shared decision making visit and CT scan on 06/10/16 at 1:30pm, pending insurance approval from business office.

## 2016-06-04 ENCOUNTER — Ambulatory Visit: Payer: PPO

## 2016-06-04 ENCOUNTER — Other Ambulatory Visit (INDEPENDENT_AMBULATORY_CARE_PROVIDER_SITE_OTHER): Payer: PPO

## 2016-06-04 DIAGNOSIS — E785 Hyperlipidemia, unspecified: Secondary | ICD-10-CM | POA: Diagnosis not present

## 2016-06-04 DIAGNOSIS — E034 Atrophy of thyroid (acquired): Secondary | ICD-10-CM

## 2016-06-04 DIAGNOSIS — E038 Other specified hypothyroidism: Secondary | ICD-10-CM

## 2016-06-04 DIAGNOSIS — E119 Type 2 diabetes mellitus without complications: Secondary | ICD-10-CM | POA: Diagnosis not present

## 2016-06-04 DIAGNOSIS — Z23 Encounter for immunization: Secondary | ICD-10-CM

## 2016-06-04 LAB — COMPREHENSIVE METABOLIC PANEL
ALK PHOS: 69 U/L (ref 39–117)
ALT: 10 U/L (ref 0–35)
AST: 16 U/L (ref 0–37)
Albumin: 4 g/dL (ref 3.5–5.2)
BILIRUBIN TOTAL: 0.4 mg/dL (ref 0.2–1.2)
BUN: 17 mg/dL (ref 6–23)
CO2: 26 meq/L (ref 19–32)
Calcium: 8.8 mg/dL (ref 8.4–10.5)
Chloride: 99 mEq/L (ref 96–112)
Creatinine, Ser: 0.79 mg/dL (ref 0.40–1.20)
GFR: 75.68 mL/min (ref >60.00)
GLUCOSE: 102 mg/dL — AB (ref 70–99)
POTASSIUM: 4.2 meq/L (ref 3.5–5.1)
SODIUM: 130 meq/L — AB (ref 135–145)
TOTAL PROTEIN: 7 g/dL (ref 6.0–8.3)

## 2016-06-04 LAB — LDL CHOLESTEROL, DIRECT: Direct LDL: 77 mg/dL

## 2016-06-04 LAB — MICROALBUMIN / CREATININE URINE RATIO
CREATININE, U: 73.9 mg/dL
MICROALB/CREAT RATIO: 0.9 mg/g (ref 0.0–30.0)
Microalb, Ur: <0.7 mg/dL (ref 0.0–1.9)

## 2016-06-04 LAB — TSH: TSH: 0.65 u[IU]/mL (ref 0.35–4.50)

## 2016-06-04 LAB — HEMOGLOBIN A1C: HEMOGLOBIN A1C: 5.9 % (ref 4.6–6.5)

## 2016-06-10 ENCOUNTER — Encounter: Payer: Self-pay | Admitting: *Deleted

## 2016-06-13 ENCOUNTER — Other Ambulatory Visit: Payer: Self-pay | Admitting: *Deleted

## 2016-06-13 DIAGNOSIS — Z87891 Personal history of nicotine dependence: Secondary | ICD-10-CM

## 2016-06-17 ENCOUNTER — Ambulatory Visit
Admission: RE | Admit: 2016-06-17 | Discharge: 2016-06-17 | Disposition: A | Discharge status: Home / Self Care | Payer: PPO | Source: Ambulatory Visit | Attending: Oncology | Admitting: Oncology

## 2016-06-17 ENCOUNTER — Inpatient Hospital Stay: Payer: PPO | Attending: Oncology | Admitting: Oncology

## 2016-06-17 ENCOUNTER — Encounter: Payer: Self-pay | Admitting: Oncology

## 2016-06-17 ENCOUNTER — Telehealth: Payer: Self-pay | Admitting: Internal Medicine

## 2016-06-17 DIAGNOSIS — Z87891 Personal history of nicotine dependence: Secondary | ICD-10-CM | POA: Insufficient documentation

## 2016-06-17 DIAGNOSIS — Z122 Encounter for screening for malignant neoplasm of respiratory organs: Secondary | ICD-10-CM

## 2016-06-17 DIAGNOSIS — I700 Atherosclerosis of aorta: Secondary | ICD-10-CM | POA: Insufficient documentation

## 2016-06-17 DIAGNOSIS — I2510 Atherosclerotic heart disease of native coronary artery without angina pectoris: Secondary | ICD-10-CM | POA: Insufficient documentation

## 2016-06-17 DIAGNOSIS — R590 Localized enlarged lymph nodes: Secondary | ICD-10-CM | POA: Insufficient documentation

## 2016-06-17 NOTE — Telephone Encounter (Signed)
Pt had labs about 3 weeks ago. She says it hurts when she goes to the bathroom. Please advise pt. [phone].

## 2016-06-17 NOTE — Telephone Encounter (Signed)
Patient needs an appt, can try to schedule with NP thanks

## 2016-06-18 ENCOUNTER — Encounter: Payer: Self-pay | Admitting: Family

## 2016-06-18 ENCOUNTER — Ambulatory Visit (INDEPENDENT_AMBULATORY_CARE_PROVIDER_SITE_OTHER): Payer: PPO | Admitting: Family

## 2016-06-18 VITALS — BP 124/66 | HR 90 | Temp 97.6°F | Wt 158.8 lb

## 2016-06-18 DIAGNOSIS — Z87891 Personal history of nicotine dependence: Secondary | ICD-10-CM | POA: Insufficient documentation

## 2016-06-18 DIAGNOSIS — N390 Urinary tract infection, site not specified: Secondary | ICD-10-CM | POA: Diagnosis not present

## 2016-06-18 DIAGNOSIS — R300 Dysuria: Secondary | ICD-10-CM

## 2016-06-18 LAB — POCT URINALYSIS DIPSTICK
BILIRUBIN UA: NEGATIVE
Blood, UA: NEGATIVE
GLUCOSE UA: NEGATIVE
Ketones, UA: NEGATIVE
NITRITE UA: NEGATIVE
PH UA: 6
Protein, UA: NEGATIVE
Spec Grav, UA: 1.01
Urobilinogen, UA: 0.2

## 2016-06-18 MED ORDER — SULFAMETHOXAZOLE-TRIMETHOPRIM 800-160 MG PO TABS: 1.0000 | ORAL_TABLET | Freq: Two times a day (BID) | ORAL | 0 refills | Status: DC

## 2016-06-18 NOTE — Progress Notes (Signed)
Subjective:    Patient ID: Jane Davidson, female    DOB: [birth year], 75 y.o.   MRN: [id number]  CC: KRISTY-LEE FLOHR is a 75 y.o. female who presents today for an acute visit.    HPI: Patient here for acute visit with chief complaint of dysuria over a week. Endorses frequency. No chills, fever, flank pain.  No blood seen in urine, no concern for STDs. No h/o recurrent UTIs or kidney disease.    HISTORY:  Past Medical History:  Diagnosis Date  . Abnormal Pap smear of cervix 2001   Biopsy normal  . Bursitis NEC   . COPD (chronic obstructive pulmonary disease) ([place])   . History of peripheral vascular disease 2001   s/p bilateral aorto-iliac-femoral bypass /[facility]  . Hyperlipidemia   . hypothyroid   . Osteopenia 2009    T scores - 1.5 DEXA 2009  . Screening for breast cancer May 2012   [facility] mammogram  . Tobacco abuse disorder   . Vitamin D deficiency April 2011   replaced withDrsido for level of 11.3 ng/ml  . Vitamin D deficiency April 2011   replaced withDrsido for level of 11.3 ng/ml   Past Surgical History:  Procedure Laterality Date  . CATARACT EXTRACTION  Sept 2011  . CHOLECYSTECTOMY    . HERNIA REPAIR  Jun 2011   [name]   . TUBAL LIGATION     Family History  Problem Relation Age of Onset  . Hypertension Mother   . Arthritis Mother   . Diabetes Father     Allergies: Review of patient's allergies indicates no known allergies. Current Outpatient Prescriptions on File Prior to Visit  Medication Sig Dispense Refill  . Cholecalciferol (VITAMIN D3) 1000 UNITS CAPS Take 1 capsule by mouth daily.    . clobetasol ointment (TEMOVATE) [id number] % Apply 1 application topically 2 (two) times daily. 30 g 5  . cyanocobalamin 100 MCG tablet Take 100 mcg by mouth daily.      Marland Kitchen doxycycline (VIBRA-TABS) 100 MG tablet Take 1 tablet (100 mg total) by mouth 2 (two) times daily. 20 tablet 0  . ergocalciferol (DRISDOL) 50000 units capsule Take 1 capsule (50,000 Units total) by  mouth once a week. 12 capsule 0  . fluticasone (FLONASE) 50 MCG/ACT nasal spray Place 2 sprays into both nostrils daily. 16 g 4  . Fluticasone-Salmeterol (ADVAIR DISKUS) 100-50 MCG/DOSE AEPB ONE PUFF EVERY 12 HOURS AS DIRECTED. RINSE MOUTH AFTER USING 60 each 5  . Lactulose 20 GM/30ML SOLN 30 ml every 4 hours until constipation is relieved 236 mL 3  . levothyroxine (SYNTHROID, LEVOTHROID) 100 MCG tablet Take 1 tablet (100 mcg total) by mouth daily. 90 tablet 3  . losartan (COZAAR) 50 MG tablet Take 1 tablet (50 mg total) by mouth daily. 30 tablet 5  . meloxicam (MOBIC) 15 MG tablet Take 1 tablet (15 mg total) by mouth daily. 30 tablet 4  . sertraline (ZOLOFT) 50 MG tablet Take 50 mg by mouth daily.      . simvastatin (ZOCOR) 20 MG tablet Take 1 tablet (20 mg total) by mouth at bedtime. 90 tablet 3  . terbinafine (LAMISIL) 250 MG tablet Take 1 tablet (250 mg total) by mouth daily. 30 tablet 2  . traMADol (ULTRAM) 50 MG tablet Take 1 tablet (50 mg total) by mouth every 6 (six) hours as needed. 120 tablet 0   No current facility-administered medications on file prior to visit.     Social History  Substance Use Topics  . Smoking status: Current Every Day Smoker    Packs/day: 0.75    Years: 40.00    Types: Cigarettes  . Smokeless tobacco: Never Used  . Alcohol use No    Review of Systems  Constitutional: Negative for chills and fever.  Respiratory: Negative for cough.   Cardiovascular: Negative for chest pain and palpitations.  Gastrointestinal: Negative for nausea and vomiting.  Genitourinary: Positive for dysuria and frequency. Negative for hematuria and vaginal discharge.      Objective:    BP 124/66   Pulse 90   Temp 97.6 F (36.4 C) (Oral)   Wt 158 lb 12.8 oz (72 kg)   SpO2 96%   BMI 26.43 kg/m    Physical Exam  Constitutional: She appears well-developed and well-nourished.  Cardiovascular: Normal rate, regular rhythm, normal heart sounds and normal pulses.     Pulmonary/Chest: Effort normal and breath sounds normal. She has no wheezes. She has no rhonchi. She has no rales.  Abdominal: There is no CVA tenderness.  Neurological: She is alert.  Skin: Skin is warm and dry.  Psychiatric: She has a normal mood and affect. Her speech is normal and behavior is normal. Thought content normal.  Vitals reviewed.      Assessment & Plan:  1. Dysuria/2. UTI (lower urinary tract infection) Urinalysis is positive for moderate leukocytes. Will treat empirically. Pending culture.  - POCT Urinalysis Dipstick - Urine Culture - sulfamethoxazole-trimethoprim (BACTRIM DS,SEPTRA DS) 800-160 MG tablet; Take 1 tablet by mouth 2 (two) times daily.  Dispense: 6 tablet; Refill: 0     I am having Ms. Fleming maintain her sertraline, cyanocobalamin, Vitamin D3, terbinafine, Lactulose, levothyroxine, ergocalciferol, Fluticasone-Salmeterol, simvastatin, doxycycline, fluticasone, traMADol, clobetasol ointment, meloxicam, and losartan.   No orders of the defined types were placed in this encounter.   Return precautions given.   Risks, benefits, and alternatives of the medications and treatment plan prescribed today were discussed, and patient expressed understanding.   Education regarding symptom management and diagnosis given to patient on AVS.  Continue to follow with TULLO, Aris Everts, MD for routine health maintenance.   Jane Davidson and I agreed with plan.   Mable Paris, FNP

## 2016-06-18 NOTE — Progress Notes (Signed)
In accordance with CMS guidelines, patient has met eligibility criteria including age, absence of signs or symptoms of lung cancer.  Social History  Substance Use Topics  . Smoking status: Current Every Day Smoker    Packs/day: 0.75    Years: 40.00    Types: Cigarettes  . Smokeless tobacco: Never Used  . Alcohol use No     A shared decision-making session was conducted prior to the performance of CT scan. This includes one or more decision aids, includes benefits and harms of screening, follow-up diagnostic testing, over-diagnosis, false positive rate, and total radiation exposure.  Counseling on the importance of adherence to annual lung cancer LDCT screening, impact of co-morbidities, and ability or willingness to undergo diagnosis and treatment is imperative for compliance of the program.  Counseling on the importance of continued smoking cessation for former smokers; the importance of smoking cessation for current smokers, and information about tobacco cessation interventions have been given to patient including [place] [facility] and [facility] quit [place] programs.  Written order for lung cancer screening with LDCT has been given to the patient and any and all questions have been answered to the best of my abilities.   Yearly follow up will be coordinated by Shawn Perkins, Thoracic Navigator.  

## 2016-06-18 NOTE — Progress Notes (Signed)
Pre visit review using our clinic review tool, if applicable. No additional management support is needed unless otherwise documented below in the visit note. 

## 2016-06-18 NOTE — Patient Instructions (Signed)
Drink plenty of water and take antibiotic as prescribed.   We are pending the urine culture to know the organism causing the infection and our office will call you with results. If the particular organism requires a different antibiotic than the on prescribed, we will place an order for a new prescription at that time.   If you symptoms worsen or you have new symptoms, please contact our office, or return to clinic for re evaluation.  Urinary Tract Infection Urinary tract infections (UTIs) can develop anywhere along your urinary tract. Your urinary tract is your body's drainage system for removing wastes and extra water. Your urinary tract includes two kidneys, two ureters, a bladder, and a urethra. Your kidneys are a pair of bean-shaped organs. Each kidney is about the size of your fist. They are located below your ribs, one on each side of your spine. CAUSES Infections are caused by microbes, which are microscopic organisms, including fungi, viruses, and bacteria. These organisms are so small that they can only be seen through a microscope. Bacteria are the microbes that most commonly cause UTIs. SYMPTOMS  Symptoms of UTIs may vary by age and gender of the patient and by the location of the infection. Symptoms in young women typically include a frequent and intense urge to urinate and a painful, burning feeling in the bladder or urethra during urination. Older women and men are more likely to be tired, shaky, and weak and have muscle aches and abdominal pain. A fever may mean the infection is in your kidneys. Other symptoms of a kidney infection include pain in your back or sides below the ribs, nausea, and vomiting. DIAGNOSIS To diagnose a UTI, your caregiver will ask you about your symptoms. Your caregiver will also ask you to provide a urine sample. The urine sample will be tested for bacteria and white blood cells. White blood cells are made by your body to help fight infection. TREATMENT    Typically, UTIs can be treated with medication. Because most UTIs are caused by a bacterial infection, they usually can be treated with the use of antibiotics. The choice of antibiotic and length of treatment depend on your symptoms and the type of bacteria causing your infection. HOME CARE INSTRUCTIONS  If you were prescribed antibiotics, take them exactly as your caregiver instructs you. Finish the medication even if you feel better after you have only taken some of the medication.  Drink enough water and fluids to keep your urine clear or pale yellow.  Avoid caffeine, tea, and carbonated beverages. They tend to irritate your bladder.  Empty your bladder often. Avoid holding urine for long periods of time.  Empty your bladder before and after sexual intercourse.  After a bowel movement, women should cleanse from front to back. Use each tissue only once. SEEK MEDICAL CARE IF:   You have back pain.  You develop a fever.  Your symptoms do not begin to resolve within 3 days. SEEK IMMEDIATE MEDICAL CARE IF:   You have severe back pain or lower abdominal pain.  You develop chills.  You have nausea or vomiting.  You have continued burning or discomfort with urination. MAKE SURE YOU:   Understand these instructions.  Will watch your condition.  Will get help right away if you are not doing well or get worse.   This information is not intended to replace advice given to you by your health care provider. Make sure you discuss any questions you have with your health care   provider.   Document Released: 07/09/2005 Document Revised: 06/20/2015 Document Reviewed: 11/07/2011 Elsevier Interactive Patient Education 2016 [employer] Inc[employer]    

## 2016-06-19 ENCOUNTER — Telehealth: Payer: Self-pay | Admitting: *Deleted

## 2016-06-19 LAB — URINE CULTURE

## 2016-06-19 NOTE — Telephone Encounter (Signed)
Notified patient of LDCT lung cancer screening results with recommendation for 12 month follow up imaging. Also notified of incidental finding noted below, which patient is encouraged to discuss with her PCP. Patient verbalizes understanding.   IMPRESSION: 1. Lung-RADS Category 2, benign appearance or behavior. Continue annual screening with low-dose chest CT without contrast in 12 months. 2.  Coronary artery atherosclerosis. Aortic atherosclerosis. 3. Borderline enlarged right paratracheal node is likely reactive and warrants followup attention.

## 2016-08-21 ENCOUNTER — Telehealth: Payer: Self-pay

## 2016-08-21 DIAGNOSIS — D649 Anemia, unspecified: Secondary | ICD-10-CM

## 2016-08-21 DIAGNOSIS — E119 Type 2 diabetes mellitus without complications: Secondary | ICD-10-CM

## 2016-08-21 DIAGNOSIS — I10 Essential (primary) hypertension: Secondary | ICD-10-CM

## 2016-08-21 DIAGNOSIS — E7800 Pure hypercholesterolemia, unspecified: Secondary | ICD-10-CM

## 2016-08-21 DIAGNOSIS — Z8679 Personal history of other diseases of the circulatory system: Secondary | ICD-10-CM

## 2016-08-21 DIAGNOSIS — E034 Atrophy of thyroid (acquired): Secondary | ICD-10-CM

## 2016-08-21 DIAGNOSIS — M8588 Other specified disorders of bone density and structure, other site: Secondary | ICD-10-CM

## 2016-08-21 NOTE — Telephone Encounter (Signed)
Her appointment is too soon to follow up on DM.  Has to be on or after nov 23rd.  Please reschedule

## 2016-08-21 NOTE — Telephone Encounter (Signed)
Please re-schedule office visit on 11/13 and lab visit on 11/10 per Dr. Derrel Nip. No need to see patient before she can have labs drawn. Insurance will not pay for labs if done before the 23rd of November.  Thank you.

## 2016-08-21 NOTE — Telephone Encounter (Signed)
Pt coming for fasting labs 08/22/16. Please place future orders. Thank you.

## 2016-08-22 ENCOUNTER — Other Ambulatory Visit: Payer: PPO

## 2016-08-22 NOTE — Telephone Encounter (Signed)
Lm on vm to call office so we could reschedule labs after 11/23 and OV needs to be re-scheduled also.

## 2016-08-22 NOTE — Telephone Encounter (Signed)
Noticed pt was not called. Called patient this morning before she came for lab appointment. Patient will call back to re-schedule lab and office visit for after 23rd.

## 2016-08-25 ENCOUNTER — Other Ambulatory Visit (INDEPENDENT_AMBULATORY_CARE_PROVIDER_SITE_OTHER): Payer: Self-pay | Admitting: Vascular Surgery

## 2016-08-25 ENCOUNTER — Ambulatory Visit: Payer: PPO | Admitting: Internal Medicine

## 2016-08-25 DIAGNOSIS — I6523 Occlusion and stenosis of bilateral carotid arteries: Secondary | ICD-10-CM

## 2016-08-29 ENCOUNTER — Ambulatory Visit (INDEPENDENT_AMBULATORY_CARE_PROVIDER_SITE_OTHER): Payer: PPO | Admitting: Vascular Surgery

## 2016-08-29 ENCOUNTER — Encounter (INDEPENDENT_AMBULATORY_CARE_PROVIDER_SITE_OTHER): Payer: Self-pay | Admitting: Vascular Surgery

## 2016-08-29 ENCOUNTER — Ambulatory Visit (INDEPENDENT_AMBULATORY_CARE_PROVIDER_SITE_OTHER): Payer: PPO

## 2016-08-29 VITALS — BP 136/79 | HR 80 | Resp 16 | Ht 65.0 in | Wt 156.0 lb

## 2016-08-29 DIAGNOSIS — I6523 Occlusion and stenosis of bilateral carotid arteries: Secondary | ICD-10-CM

## 2016-08-29 DIAGNOSIS — I6529 Occlusion and stenosis of unspecified carotid artery: Secondary | ICD-10-CM | POA: Insufficient documentation

## 2016-08-29 DIAGNOSIS — I10 Essential (primary) hypertension: Secondary | ICD-10-CM | POA: Diagnosis not present

## 2016-08-29 DIAGNOSIS — E785 Hyperlipidemia, unspecified: Secondary | ICD-10-CM | POA: Diagnosis not present

## 2016-08-29 NOTE — Assessment & Plan Note (Signed)
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 They're doing well without any focal neurologic symptoms. They will continue appropriate medical management with a statin agent. She says she was not taking aspirin and we recommended that she start taking this daily. We will plan to see them back in 12 months with follow-up duplex. They will contact our office with any problems or focal neurologic deficits in the interim.

## 2016-08-29 NOTE — Assessment & Plan Note (Signed)
lipid control important in reducing the progression of atherosclerotic disease. Continue statin therapy  

## 2016-08-29 NOTE — Progress Notes (Signed)
MRN : [id number]  Jane Davidson is a 75 y.o. (14-Aug-1941) female who presents with chief complaint of  Chief Complaint  Patient presents with  . Re-evaluation    6 month carotid  .  History of Present Illness: Patient returns in follow-up of her carotid disease. She is almost 2 years status post right carotid endarterectomy. 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 They're doing well without any focal neurologic symptoms. Specifically, the patient denies amaurosis fugax, speech or swallowing difficulties, or arm or leg weakness or numbness   Current Outpatient Prescriptions  Medication Sig Dispense Refill  . Cholecalciferol (VITAMIN D3) 1000 UNITS CAPS Take 1 capsule by mouth daily.    . clobetasol ointment (TEMOVATE) [id number] % Apply 1 application topically 2 (two) times daily. 30 g 5  . cyanocobalamin 100 MCG tablet Take 100 mcg by mouth daily.      Marland Kitchen doxycycline (VIBRA-TABS) 100 MG tablet Take 1 tablet (100 mg total) by mouth 2 (two) times daily. 20 tablet 0  . ergocalciferol (DRISDOL) 50000 units capsule Take 1 capsule (50,000 Units total) by mouth once a week. 12 capsule 0  . fluticasone (FLONASE) 50 MCG/ACT nasal spray Place 2 sprays into both nostrils daily. 16 g 4  . Fluticasone-Salmeterol (ADVAIR DISKUS) 100-50 MCG/DOSE AEPB ONE PUFF EVERY 12 HOURS AS DIRECTED. RINSE MOUTH AFTER USING 60 each 5  . Lactulose 20 GM/30ML SOLN 30 ml every 4 hours until constipation is relieved 236 mL 3  . levothyroxine (SYNTHROID, LEVOTHROID) 100 MCG tablet Take 1 tablet (100 mcg total) by mouth daily. 90 tablet 3  . losartan (COZAAR) 50 MG tablet Take 1 tablet (50 mg total) by mouth daily. 30 tablet 5  . meloxicam (MOBIC) 15 MG tablet Take 1 tablet (15 mg total) by mouth daily. 30 tablet 4  . sertraline (ZOLOFT) 50 MG tablet Take 50 mg by mouth daily.      . simvastatin (ZOCOR) 20 MG tablet Take 1  tablet (20 mg total) by mouth at bedtime. 90 tablet 3  . sulfamethoxazole-trimethoprim (BACTRIM DS,SEPTRA DS) 800-160 MG tablet Take 1 tablet by mouth 2 (two) times daily. 6 tablet 0  . terbinafine (LAMISIL) 250 MG tablet Take 1 tablet (250 mg total) by mouth daily. 30 tablet 2  . traMADol (ULTRAM) 50 MG tablet Take 1 tablet (50 mg total) by mouth every 6 (six) hours as needed. 120 tablet 0   No current facility-administered medications for this visit.     Past Medical History:  Diagnosis Date  . Abnormal Pap smear of cervix 2001   Biopsy normal  . Bursitis NEC   . COPD (chronic obstructive pulmonary disease) ([place])   . History of peripheral vascular disease 2001   s/p bilateral aorto-iliac-femoral bypass /[facility]  . Hyperlipidemia   . hypothyroid   . Osteopenia 2009    T scores - 1.5 DEXA 2009  . Screening for breast cancer May 2012   [facility] mammogram  . Tobacco abuse disorder   . Vitamin D deficiency April 2011   replaced withDrsido for level of 11.3 ng/ml  . Vitamin D deficiency April 2011   replaced withDrsido for level of 11.3 ng/ml    Past Surgical History:  Procedure Laterality Date  . CATARACT EXTRACTION  Sept 2011  . CHOLECYSTECTOMY    . HERNIA REPAIR  Jun 2011   [name]   . TUBAL LIGATION  Social History Social History  Substance Use Topics  . Smoking status: Current Every Day Smoker    Packs/day: 0.75    Years: 40.00    Types: Cigarettes  . Smokeless tobacco: Never Used  . Alcohol use No      Family History Family History  Problem Relation Age of Onset  . Hypertension Mother   . Arthritis Mother   . Diabetes Father      No Known Allergies   REVIEW OF SYSTEMS (Negative unless checked)  Constitutional: [] Weight loss  [] Fever  [] Chills Cardiac: [] Chest pain   [] Chest pressure   [] Palpitations   [] Shortness of breath when laying flat   [] Shortness of breath at rest   [] Shortness of breath with exertion. Vascular:  [] Pain in legs with  walking   [] Pain in legs at rest   [] Pain in legs when laying flat   [] Claudication   [] Pain in feet when walking  [] Pain in feet at rest  [] Pain in feet when laying flat   [] History of DVT   [] Phlebitis   [] Swelling in legs   [] Varicose veins   [] Non-healing ulcers Pulmonary:   [] Uses home oxygen   [] Productive cough   [] Hemoptysis   [] Wheeze  [] COPD   [] Asthma Neurologic:  [] Dizziness  [] Blackouts   [] Seizures   [] History of stroke   [] History of TIA  [] Aphasia   [] Temporary blindness   [] Dysphagia   [] Weakness or numbness in arms   [] Weakness or numbness in legs Musculoskeletal:  [] Arthritis   [] Joint swelling   [] Joint pain   [] Low back pain Hematologic:  [] Easy bruising  [] Easy bleeding   [] Hypercoagulable state   [] Anemic  [] Hepatitis Gastrointestinal:  [] Blood in stool   [] Vomiting blood  [] Gastroesophageal reflux/heartburn   [] Difficulty swallowing. Genitourinary:  [] Chronic kidney disease   [] Difficult urination  [] Frequent urination  [] Burning with urination   [] Blood in urine Skin:  [] Rashes   [] Ulcers   [] Wounds Psychological:  [] History of anxiety   []  History of major depression.  Physical Examination  Vitals:   08/29/16 1437 08/29/16 1438  BP: (!) 145/72 136/79  Pulse: 70 80  Resp: 16   Weight: 156 lb (70.8 kg)   Height: 5\' 5"  (1.651 m)    Body mass index is 25.96 kg/m. Gen:  WD/WN, NAD Head: [place]/AT, No temporalis wasting. Ear/Nose/Throat: Hearing grossly intact, nares w/o erythema or drainage, trachea midline Eyes: Conjunctiva clear. Sclera non-icteric Neck: Supple.  No JVD.  Pulmonary:  Good air movement, equal and clear to auscultation bilaterally.  Cardiac: RRR, normal S1, S2, no Murmurs, rubs or gallops. Vascular:  Vessel Right Left  Radial Palpable Palpable  Ulnar Palpable Palpable  Brachial Palpable Palpable  Carotid Palpable, without bruit Palpable, with bruit  Aorta Not palpable N/A  Femoral Palpable Palpable  Popliteal Palpable Palpable  PT Palpable  Palpable  DP Palpable Palpable   Gastrointestinal: soft, non-tender/non-distended. No guarding/reflex.  Musculoskeletal: M/S 5/5 throughout.  No deformity or atrophy. Scattered varicosities bilaterally Neurologic: CN 2-12 intact. Sensation grossly intact in extremities.  Symmetrical.  Speech is fluent. Motor exam as listed above. Psychiatric: Judgment intact, Mood & affect appropriate for pt's clinical situation. Dermatologic: No rashes or ulcers noted.  No cellulitis or open wounds. Lymph : No Cervical, Axillary, or Inguinal lymphadenopathy.     CBC Lab Results  Component Value Date   WBC 18.7 (H) 11/03/2014   HGB 11.3 (L) 11/03/2014   HCT 34.7 (L) 11/03/2014   MCV 94 11/03/2014   PLT  259 11/03/2014    BMET    Component Value Date/Time   NA 130 (L) 06/04/2016 0803   NA 132 (L) 11/03/2014 0430   K 4.2 06/04/2016 0803   K 4.6 11/03/2014 0430   CL 99 06/04/2016 0803   CL 102 11/03/2014 0430   CO2 26 06/04/2016 0803   CO2 24 11/03/2014 0430   GLUCOSE 102 (H) 06/04/2016 0803   GLUCOSE 134 (H) 11/03/2014 0430   BUN 17 06/04/2016 0803   BUN 19 (H) 11/03/2014 0430   CREATININE 0.79 06/04/2016 0803   CREATININE 0.80 11/03/2014 0430   CALCIUM 8.8 06/04/2016 0803   CALCIUM 8.4 (L) 11/03/2014 0430   GFRNONAA >60 11/03/2014 0430   GFRAA >60 11/03/2014 0430   CrCl cannot be calculated (Patient's most recent lab result is older than the maximum 21 days allowed.).  COAG Lab Results  Component Value Date   INR 1.0 11/03/2014    Radiology No results found.   Assessment/Plan No problem-specific Assessment & Plan notes found for this encounter.    Leotis Pain, MD  08/29/2016 3:08 PM    This note was created with Dragon medical transcription system.  Any errors from dictation are purely unintentional

## 2016-08-29 NOTE — Assessment & Plan Note (Signed)
Recently started on more blood pressure medicine by her primary care physician. Important to control blood pressure to avoid progression of vascular disease.

## 2016-09-08 DIAGNOSIS — H524 Presbyopia: Secondary | ICD-10-CM | POA: Diagnosis not present

## 2016-09-08 DIAGNOSIS — E7800 Pure hypercholesterolemia, unspecified: Secondary | ICD-10-CM | POA: Diagnosis not present

## 2016-09-08 DIAGNOSIS — H52223 Regular astigmatism, bilateral: Secondary | ICD-10-CM | POA: Diagnosis not present

## 2016-09-08 DIAGNOSIS — Z961 Presence of intraocular lens: Secondary | ICD-10-CM | POA: Diagnosis not present

## 2016-09-08 DIAGNOSIS — H5203 Hypermetropia, bilateral: Secondary | ICD-10-CM | POA: Diagnosis not present

## 2016-09-08 DIAGNOSIS — I10 Essential (primary) hypertension: Secondary | ICD-10-CM | POA: Diagnosis not present

## 2016-09-08 LAB — HM DIABETES EYE EXAM

## 2016-09-12 DIAGNOSIS — H6001 Abscess of right external ear: Secondary | ICD-10-CM | POA: Diagnosis not present

## 2016-09-23 DIAGNOSIS — Z85828 Personal history of other malignant neoplasm of skin: Secondary | ICD-10-CM | POA: Diagnosis not present

## 2016-09-23 DIAGNOSIS — Z872 Personal history of diseases of the skin and subcutaneous tissue: Secondary | ICD-10-CM | POA: Diagnosis not present

## 2016-09-23 DIAGNOSIS — Z08 Encounter for follow-up examination after completed treatment for malignant neoplasm: Secondary | ICD-10-CM | POA: Diagnosis not present

## 2016-09-23 DIAGNOSIS — L821 Other seborrheic keratosis: Secondary | ICD-10-CM | POA: Diagnosis not present

## 2016-09-23 DIAGNOSIS — Z1283 Encounter for screening for malignant neoplasm of skin: Secondary | ICD-10-CM | POA: Diagnosis not present

## 2016-10-02 ENCOUNTER — Other Ambulatory Visit (INDEPENDENT_AMBULATORY_CARE_PROVIDER_SITE_OTHER): Payer: PPO

## 2016-10-02 DIAGNOSIS — M8588 Other specified disorders of bone density and structure, other site: Secondary | ICD-10-CM | POA: Diagnosis not present

## 2016-10-02 DIAGNOSIS — E034 Atrophy of thyroid (acquired): Secondary | ICD-10-CM

## 2016-10-02 DIAGNOSIS — E7800 Pure hypercholesterolemia, unspecified: Secondary | ICD-10-CM

## 2016-10-02 DIAGNOSIS — D649 Anemia, unspecified: Secondary | ICD-10-CM

## 2016-10-02 DIAGNOSIS — I10 Essential (primary) hypertension: Secondary | ICD-10-CM

## 2016-10-02 DIAGNOSIS — E119 Type 2 diabetes mellitus without complications: Secondary | ICD-10-CM | POA: Diagnosis not present

## 2016-10-02 LAB — COMPREHENSIVE METABOLIC PANEL
ALT: 10 U/L (ref 0–35)
AST: 14 U/L (ref 0–37)
Albumin: 3.9 g/dL (ref 3.5–5.2)
Alkaline Phosphatase: 65 U/L (ref 39–117)
BILIRUBIN TOTAL: 0.5 mg/dL (ref 0.2–1.2)
BUN: 11 mg/dL (ref 6–23)
CALCIUM: 9.1 mg/dL (ref 8.4–10.5)
CHLORIDE: 103 meq/L (ref 96–112)
CO2: 31 meq/L (ref 19–32)
CREATININE: 0.72 mg/dL (ref 0.40–1.20)
GFR: 84.16 mL/min (ref >60.00)
GLUCOSE: 101 mg/dL — AB (ref 70–99)
Potassium: 4.8 mEq/L (ref 3.5–5.1)
Sodium: 137 mEq/L (ref 135–145)
Total Protein: 6.5 g/dL (ref 6.0–8.3)

## 2016-10-02 LAB — CBC WITH DIFFERENTIAL/PLATELET
BASOS PCT: 0.8 % (ref 0.0–3.0)
Basophils Absolute: 0.1 10*3/uL (ref 0.0–0.1)
EOS PCT: 4.7 % (ref 0.0–5.0)
Eosinophils Absolute: 0.4 10*3/uL (ref 0.0–0.7)
HEMATOCRIT: 36.6 % (ref 36.0–46.0)
HEMOGLOBIN: 12.4 g/dL (ref 12.0–15.0)
Lymphocytes Relative: 16 % (ref 12.0–46.0)
Lymphs Abs: 1.2 10*3/uL (ref 0.7–4.0)
MCHC: 34 g/dL (ref 30.0–36.0)
MCV: 90.6 fl (ref 78.0–100.0)
MONOS PCT: 7.2 % (ref 3.0–12.0)
Monocytes Absolute: 0.5 10*3/uL (ref 0.1–1.0)
Neutro Abs: 5.4 10*3/uL (ref 1.4–7.7)
Neutrophils Relative %: 71.3 % (ref 43.0–77.0)
Platelets: 273 10*3/uL (ref 150.0–400.0)
RBC: 4.04 Mil/uL (ref 3.87–5.11)
RDW: 13.6 % (ref 11.5–15.5)
WBC: 7.6 10*3/uL (ref 4.0–10.5)

## 2016-10-02 LAB — HEMOGLOBIN A1C: Hgb A1c MFr Bld: 5.9 % (ref 4.6–6.5)

## 2016-10-02 LAB — MICROALBUMIN / CREATININE URINE RATIO
Creatinine,U: 156.5 mg/dL
MICROALB UR: 0.9 mg/dL (ref 0.0–1.9)
Microalb Creat Ratio: 0.6 mg/g (ref 0.0–30.0)

## 2016-10-02 LAB — LIPID PANEL
CHOL/HDL RATIO: 3
CHOLESTEROL: 143 mg/dL (ref 0–200)
HDL: 51.2 mg/dL (ref >39.00)
LDL CALC: 78 mg/dL (ref 0–99)
NonHDL: 92.22
TRIGLYCERIDES: 69 mg/dL (ref 0.0–149.0)
VLDL: 13.8 mg/dL (ref 0.0–40.0)

## 2016-10-02 LAB — LDL CHOLESTEROL, DIRECT: Direct LDL: 78 mg/dL

## 2016-10-02 LAB — VITAMIN D 25 HYDROXY (VIT D DEFICIENCY, FRACTURES): VITD: 20.42 ng/mL — ABNORMAL LOW (ref 30.00–100.00)

## 2016-10-02 LAB — TSH: TSH: 2.56 u[IU]/mL (ref 0.35–4.50)

## 2016-10-03 ENCOUNTER — Ambulatory Visit (INDEPENDENT_AMBULATORY_CARE_PROVIDER_SITE_OTHER): Payer: PPO

## 2016-10-03 VITALS — BP 130/70 | HR 98 | Temp 97.8°F | Resp 12 | Ht 64.0 in | Wt 157.0 lb

## 2016-10-03 DIAGNOSIS — Z0000 Encounter for general adult medical examination without abnormal findings: Secondary | ICD-10-CM | POA: Diagnosis not present

## 2016-10-03 NOTE — Patient Instructions (Addendum)
Jane Davidson , Thank you for taking time to come for your Medicare Wellness Visit. I appreciate your ongoing commitment to your health goals. Please review the following plan we discussed and let me know if I can assist you in the future.   Follow up with Dr. Derrel Nip as needed.  Merry Christmas and Happy New Year!!!  These are the goals we discussed: Goals    . Patient centered goal          Stay hydrated and drink plenty of water    . Reduce sugar intake          Reduce portion size of sweets (Candy, cakes, cookies, pies etc...) Low carb foods. Lean meats, vegetables.        This is a list of the screening recommended for you and due dates:  Health Maintenance  Topic Date Due  . DEXA scan (bone density measurement)  10/07/2007  . Mammogram  09/04/2016  . Eye exam for diabetics  09/07/2016  . Complete foot exam   11/07/2016  . Hemoglobin A1C  04/02/2017  . Colon Cancer Screening  05/21/2020  . Tetanus Vaccine  10/15/2021  . Flu Shot  Completed  . Shingles Vaccine  Completed  . Pneumonia vaccines  Completed   Bone Densitometry Introduction Bone densitometry is an imaging test that uses a special X-ray to measure the amount of calcium and other minerals in your bones (bone density). This test is also known as a bone mineral density test or dual-energy X-ray absorptiometry (DXA). The test can measure bone density at your hip and your spine. It is similar to having a regular X-ray. You may have this test to:  Diagnose a condition that causes weak or thin bones (osteoporosis).  Predict your risk of a broken bone (fracture).  Determine how well osteoporosis treatment is working. Tell a health care provider about:  Any allergies you have.  All medicines you are taking, including vitamins, herbs, eye drops, creams, and over-the-counter medicines.  Any problems you or family members have had with anesthetic medicines.  Any blood disorders you have.  Any surgeries you have  had.  Any medical conditions you have.  Possibility of pregnancy.  Any other medical test you had within the previous 14 days that used contrast material. What are the risks? Generally, this is a safe procedure. However, problems can occur and may include the following:  This test exposes you to a very small amount of radiation.  The risks of radiation exposure may be greater to unborn children. What happens before the procedure?  Do not take any calcium supplements for 24 hours before having the test. You can otherwise eat and drink what you usually do.  Take off all metal jewelry, eyeglasses, dental appliances, and any other metal objects. What happens during the procedure?  You may lie on an exam table. There will be an X-ray generator below you and an imaging device above you.  Other devices, such as boxes or braces, may be used to position your body properly for the scan.  You will need to lie still while the machine slowly scans your body.  The images will show up on a computer monitor. What happens after the procedure? You may need more testing at a later time. This information is not intended to replace advice given to you by your health care provider. Make sure you discuss any questions you have with your health care provider. Document Released: 10/21/2004 Document Revised: 03/06/2016 Document Reviewed:  03/09/2014  2017 Elsevier   Mammogram A mammogram is an X-ray of the breasts that is done to check for abnormal changes. This procedure can screen for and detect any changes that may suggest breast cancer. A mammogram can also identify other changes and variations in the breast, such as:  Inflammation of the breast tissue (mastitis).  An infected area that contains a collection of pus (abscess).  A fluid-filled sac (cyst).  Fibrocystic changes. This is when breast tissue becomes denser, which can make the tissue feel rope-like or uneven under the skin.  Tumors  that are not cancerous (benign). Tell a health care provider about:  Any allergies you have.  If you have breast implants.  If you have had previous breast disease, biopsy, or surgery.  If you are breastfeeding.  Any possibility that you could be pregnant, if this applies.  If you are younger than age 37.  If you have a family history of breast cancer. What are the risks? Generally, this is a safe procedure. However, problems may occur, including:  Exposure to radiation. Radiation levels are very low with this test.  The results being misinterpreted.  The need for further tests.  The inability of the mammogram to detect certain cancers. What happens before the procedure?  Schedule your test about 1-2 weeks after your menstrual period. This is usually when your breasts are the least tender.  If you have had a mammogram done at a different facility in the past, get the mammogram X-rays or have them sent to your current exam facility in order to compare them.  Wash your breasts and under your arms the day of the test.  Do not wear deodorants, perfumes, lotions, or powders anywhere on your body on the day of the test.  Remove any jewelry from your neck.  Wear clothes that you can change into and out of easily. What happens during the procedure?  You will undress from the waist up and put on a gown.  You will stand in front of the X-ray machine.  Each breast will be placed between two plastic or glass plates. The plates will compress your breast for a few seconds. Try to stay as relaxed as possible during the procedure. This does not cause any harm to your breasts and any discomfort you feel will be very brief.  X-rays will be taken from different angles of each breast. The procedure may vary among health care providers and hospitals. What happens after the procedure?  The mammogram will be examined by a specialist (radiologist).  You may need to repeat certain parts  of the test, depending on the quality of the images. This is commonly done if the radiologist needs a better view of the breast tissue.  Ask when your test results will be ready. Make sure you get your test results.  You may resume your normal activities. This information is not intended to replace advice given to you by your health care provider. Make sure you discuss any questions you have with your health care provider. Document Released: 09/26/2000 Document Revised: 03/03/2016 Document Reviewed: 12/08/2014 Elsevier Interactive Patient Education  2017 [employer].

## 2016-10-03 NOTE — Progress Notes (Signed)
Subjective:   Jane Davidson is a 75 y.o. female who presents for Medicare Annual (Subsequent) preventive examination.  Review of Systems:  No ROS.  Medicare Wellness Visit.  Cardiac Risk Factors include: hypertension;advanced age (>21men, >44 women);diabetes mellitus     Objective:     Vitals: BP 130/70   Pulse 98   Temp 97.8 F (36.6 C) (Oral)   Resp 12   Ht 5\' 4"  (1.626 m)   Wt 157 lb (71.2 kg)   SpO2 97%   BMI 26.95 kg/m   Body mass index is 26.95 kg/m.   Tobacco History  Smoking Status  . Current Every Day Smoker  . Packs/day: 0.75  . Years: 40.00  . Types: Cigarettes  Smokeless Tobacco  . Never Used     Ready to quit: No Counseling given: Not Answered   Past Medical History:  Diagnosis Date  . Abnormal Pap smear of cervix 2001   Biopsy normal  . Bursitis NEC   . COPD (chronic obstructive pulmonary disease) ([place])   . History of peripheral vascular disease 2001   s/p bilateral aorto-iliac-femoral bypass /[place]  . Hyperlipidemia   . hypothyroid   . Osteopenia 2009    T scores - 1.5 DEXA 2009  . Screening for breast cancer May 2012   [facility] mammogram  . Tobacco abuse disorder   . Vitamin D deficiency April 2011   replaced withDrsido for level of 11.3 ng/ml  . Vitamin D deficiency April 2011   replaced withDrsido for level of 11.3 ng/ml   Past Surgical History:  Procedure Laterality Date  . CATARACT EXTRACTION  Sept 2011  . CHOLECYSTECTOMY    . HERNIA REPAIR  Jun 2011   [name]   . TUBAL LIGATION     Family History  Problem Relation Age of Onset  . Hypertension Mother   . Arthritis Mother   . Diabetes Father    History  Sexual Activity  . Sexual activity: No    Outpatient Encounter Prescriptions as of 10/03/2016  Medication Sig  . Cholecalciferol (VITAMIN D3) 1000 UNITS CAPS Take 1 capsule by mouth daily.  . clobetasol ointment (TEMOVATE) [id number] % Apply 1 application topically 2 (two) times daily.  . cyanocobalamin 100 MCG  tablet Take 100 mcg by mouth daily.    Marland Kitchen doxycycline (VIBRA-TABS) 100 MG tablet Take 1 tablet (100 mg total) by mouth 2 (two) times daily.  . ergocalciferol (DRISDOL) 50000 units capsule Take 1 capsule (50,000 Units total) by mouth once a week.  . fluticasone (FLONASE) 50 MCG/ACT nasal spray Place 2 sprays into both nostrils daily.  . Fluticasone-Salmeterol (ADVAIR DISKUS) 100-50 MCG/DOSE AEPB ONE PUFF EVERY 12 HOURS AS DIRECTED. RINSE MOUTH AFTER USING  . Lactulose 20 GM/30ML SOLN 30 ml every 4 hours until constipation is relieved  . levothyroxine (SYNTHROID, LEVOTHROID) 100 MCG tablet Take 1 tablet (100 mcg total) by mouth daily.  Marland Kitchen losartan (COZAAR) 50 MG tablet Take 1 tablet (50 mg total) by mouth daily.  . meloxicam (MOBIC) 15 MG tablet Take 1 tablet (15 mg total) by mouth daily.  . sertraline (ZOLOFT) 50 MG tablet Take 50 mg by mouth daily.    . simvastatin (ZOCOR) 20 MG tablet Take 1 tablet (20 mg total) by mouth at bedtime.  . terbinafine (LAMISIL) 250 MG tablet Take 1 tablet (250 mg total) by mouth daily.  . traMADol (ULTRAM) 50 MG tablet Take 1 tablet (50 mg total) by mouth every 6 (six) hours as needed.  . [  DISCONTINUED] sulfamethoxazole-trimethoprim (BACTRIM DS,SEPTRA DS) 800-160 MG tablet Take 1 tablet by mouth 2 (two) times daily.   No facility-administered encounter medications on file as of 10/03/2016.     Activities of Daily Living In your present state of health, do you have any difficulty performing the following activities: 10/03/2016 10/09/2015  Hearing? N N  Vision? N N  Difficulty concentrating or making decisions? N N  Walking or climbing stairs? Y N  Dressing or bathing? N N  Doing errands, shopping? N N  Preparing Food and eating ? N N  Using the Toilet? N N  In the past six months, have you accidently leaked urine? Y N  Do you have problems with loss of bowel control? N N  Managing your Medications? N N  Managing your Finances? N N  Housekeeping or managing  your Housekeeping? N N  Some recent data might be hidden    Patient Care Team: Crecencio Mc, MD as PCP - General (Internal Medicine)    Assessment:    This is a routine wellness examination for Jameyah. The goal of the wellness visit is to assist the patient how to close the gaps in care and create a preventative care plan for the patient.   Taking calcium VIT D3 as appropriate/Osteoporosis risk reviewed.  Medications reviewed; taking without issues or barriers.  Safety issues reviewed; smoke detectors in the home. No firearms in the home. Wears seatbelts when driving or riding with others. No violence in the home.  No identified risk were noted; The patient was oriented x 3; appropriate in dress and manner and no objective failures at ADL's or IADL's.   BMI; discussed the importance of a healthy diet, water intake and exercise. Educational material provided.  Bone density and mammogram previously ordered; follow as directed.    Patient Concerns: None at this time. Follow up with PCP as needed.  Exercise Activities and Dietary recommendations Current Exercise Habits: Home exercise routine, Type of exercise: walking, Frequency (Times/Week): 3, Intensity: Mild  Goals    . Patient centered goal          Stay hydrated and drink plenty of water    . Reduce sugar intake          Reduce portion size of sweets (Candy, cakes, cookies, pies etc...) Low carb foods. Lean meats, vegetables.       Fall Risk Fall Risk  10/03/2016 06/18/2016 10/09/2015 02/26/2015 09/20/2012  Falls in the past year? No No Yes Yes No  Number falls in past yr: - - 1 1 -  Injury with Fall? - - No No -  Follow up - - Education provided;Falls prevention discussed - -   Depression Screen PHQ 2/9 Scores 10/03/2016 06/18/2016 10/09/2015 02/26/2015  PHQ - 2 Score 0 0 0 0     Cognitive Function MMSE - Mini Mental State Exam 10/09/2015  Orientation to time 5  Orientation to Place 5  Registration 3    Attention/ Calculation 5  Recall 3  Language- name 2 objects 2  Language- repeat 1  Language- follow 3 step command 3  Language- read & follow direction 1  Write a sentence 1  Copy design 1  Total score 30     6CIT Screen 10/03/2016  What Year? 0 points  What month? 0 points  What time? 0 points  Count back from 20 0 points  Months in reverse 0 points    Immunization History  Administered Date(s) Administered  . Influenza  Split 07/05/2012, 07/27/2013, 06/29/2014  . Influenza,inj,Quad PF,36+ Mos 06/04/2016  . Influenza-Unspecified 07/20/2015  . Pneumococcal Conjugate-13 04/06/2014  . Pneumococcal Polysaccharide-23 07/05/2012  . Tdap 10/16/2011  . Zoster 12/19/2011   Screening Tests Health Maintenance  Topic Date Due  . DEXA SCAN  10/07/2007  . MAMMOGRAM  09/04/2016  . OPHTHALMOLOGY EXAM  09/07/2016  . FOOT EXAM  11/07/2016  . HEMOGLOBIN A1C  04/02/2017  . COLONOSCOPY  05/21/2020  . TETANUS/TDAP  10/15/2021  . INFLUENZA VACCINE  Completed  . ZOSTAVAX  Completed  . PNA vac Low Risk Adult  Completed      Plan:    End of life planning; Advance aging; Advanced directives discussed. Copy of current HCPOA/Living Will requested.  Medicare Attestation I have personally reviewed: The patient's medical and social history Their use of alcohol, tobacco or illicit drugs Their current medications and supplements The patient's functional ability including ADLs,fall risks, home safety risks, cognitive, and hearing and visual impairment Diet and physical activities Evidence for depression   The patient's weight, height, BMI, and visual acuity have been recorded in the chart.  I have made referrals and provided education to the patient based on review of the above and I have provided the patient with a written personalized care plan for preventive services.    During the course of the visit the patient was educated and counseled about the following appropriate screening and  preventive services:   Vaccines to include Pneumoccal, Influenza, Hepatitis B, Td, Zostavax, HCV  Electrocardiogram  Cardiovascular Disease  Colorectal cancer screening  Bone density screening  Diabetes screening  Glaucoma screening  Mammography/PAP  Nutrition counseling   Patient Instructions (the written plan) was given to the patient.   Varney Biles, LPN  [id number]   Reviewed above information.  Agree with plan.  Dr Nicki Reaper

## 2016-10-04 ENCOUNTER — Other Ambulatory Visit: Payer: Self-pay | Admitting: Internal Medicine

## 2016-10-05 MED ORDER — ERGOCALCIFEROL 1.25 MG (50000 UT) PO CAPS: 50000.0000 [IU] | ORAL_CAPSULE | ORAL | 3 refills | Status: DC

## 2016-10-05 NOTE — Addendum Note (Signed)
Addended by: Crecencio Mc on: 10/05/2016 01:49 PM   Modules accepted: Orders

## 2016-10-07 ENCOUNTER — Ambulatory Visit: Payer: PPO | Admitting: Internal Medicine

## 2016-10-07 NOTE — Telephone Encounter (Signed)
Last refiledl was 7/17  Last OV 8/17.

## 2016-10-08 ENCOUNTER — Ambulatory Visit: Payer: PPO

## 2016-10-08 NOTE — Telephone Encounter (Signed)
Refill for 30 days only.  OFFICE VISIT NEEDED prior to any more refills  Will require 3 month follow up for future refills per new controlled substance policy of Dr Derrel Nip

## 2016-10-09 NOTE — Telephone Encounter (Signed)
Patient scheduled.

## 2016-10-10 DIAGNOSIS — F3342 Major depressive disorder, recurrent, in full remission: Secondary | ICD-10-CM | POA: Diagnosis not present

## 2016-10-14 ENCOUNTER — Telehealth: Payer: Self-pay | Admitting: Internal Medicine

## 2016-10-14 ENCOUNTER — Other Ambulatory Visit: Payer: Self-pay

## 2016-10-14 DIAGNOSIS — E2839 Other primary ovarian failure: Secondary | ICD-10-CM

## 2016-10-14 NOTE — Telephone Encounter (Signed)
Pt called and stated that when she came in to see you, you had mentioned that pt should have a bone density done. Pt called to schedule but there is no order in. Pt is scheduled to have Mammogram done tomorrow. Please advise, thank you!  Call pt @ [phone]

## 2016-10-14 NOTE — Telephone Encounter (Signed)
Bone density order was placed by Dr. Derrel Nip 02/18/16.  Referral coordinator did not receive. New order has been placed today.

## 2016-10-15 ENCOUNTER — Ambulatory Visit
Admission: RE | Admit: 2016-10-15 | Discharge: 2016-10-15 | Disposition: A | Discharge status: Home / Self Care | Payer: PPO | Source: Ambulatory Visit | Attending: Internal Medicine | Admitting: Internal Medicine

## 2016-10-15 DIAGNOSIS — Z780 Asymptomatic menopausal state: Secondary | ICD-10-CM | POA: Diagnosis not present

## 2016-10-15 DIAGNOSIS — Z720 Tobacco use: Secondary | ICD-10-CM | POA: Diagnosis not present

## 2016-10-15 DIAGNOSIS — Z1231 Encounter for screening mammogram for malignant neoplasm of breast: Secondary | ICD-10-CM | POA: Diagnosis not present

## 2016-10-15 DIAGNOSIS — M85852 Other specified disorders of bone density and structure, left thigh: Secondary | ICD-10-CM | POA: Insufficient documentation

## 2016-10-15 DIAGNOSIS — E2839 Other primary ovarian failure: Secondary | ICD-10-CM | POA: Insufficient documentation

## 2016-10-15 DIAGNOSIS — Z1239 Encounter for other screening for malignant neoplasm of breast: Secondary | ICD-10-CM

## 2016-10-30 ENCOUNTER — Ambulatory Visit: Payer: PPO | Admitting: Internal Medicine

## 2016-10-31 DIAGNOSIS — J019 Acute sinusitis, unspecified: Secondary | ICD-10-CM | POA: Diagnosis not present

## 2016-11-03 ENCOUNTER — Ambulatory Visit (INDEPENDENT_AMBULATORY_CARE_PROVIDER_SITE_OTHER): Payer: PPO | Admitting: Internal Medicine

## 2016-11-03 ENCOUNTER — Encounter: Payer: Self-pay | Admitting: Internal Medicine

## 2016-11-03 DIAGNOSIS — J0100 Acute maxillary sinusitis, unspecified: Secondary | ICD-10-CM

## 2016-11-03 DIAGNOSIS — E119 Type 2 diabetes mellitus without complications: Secondary | ICD-10-CM

## 2016-11-03 MED ORDER — CLOTRIMAZOLE-BETAMETHASONE 1-0.05 % EX CREA
1.0000 | TOPICAL_CREAM | Freq: Two times a day (BID) | CUTANEOUS | 0 refills | Status: DC
Start: 2016-11-03 — End: 2017-03-27

## 2016-11-03 NOTE — Patient Instructions (Addendum)
Taking an antibiotic can create an imbalance in the normal population of bacteria that live in the small intestine.  This imbalance can persist for 3 months.   Taking your Jane Davidson daily probiotic for a minimum of 3 weeks may help prevent a serious antibiotic associated diarrhea  Called clostridium dificile colitis that occurs when the bacteria population is altered .  Taking a probiotic may also prevent vaginitis due to yeast infections and can be continued indefinitely if you feel that it improves your digestion or your elimination (bowels).     Ok to take miralax and a stool softener every day,  For your bowels   You can use benadryl for the post nasal drip,  And flush your sinuses with salt water for nasal drainage.  Your cough may continue for a few weeks

## 2016-11-03 NOTE — Progress Notes (Signed)
Pre visit review using our clinic review tool, if applicable. No additional management support is needed unless otherwise documented below in the visit note. 

## 2016-11-03 NOTE — Progress Notes (Signed)
Subjective:  Patient ID: Jane Davidson, female    DOB: [birth year]  Age: 76 y.o. MRN: [id number]  CC: Diagnoses of Diabetes mellitus without complication ([place]) and Acute maxillary sinusitis, recurrence not specified were pertinent to this visit.  HPI Jane Davidson presents for follow up on multiple issues.    1) was treated for sinusitis with augmentin and tessalon on jan 19th .   Days her symptoms had been present for 3-4 days and got worse, ( weak , no appetite ,  Right frontal pain AND BLOODY DISCHARGE)  days .  Cough has improved.    2)  follow up on diabetes.  Patient has no complaints today.  Patient is following a low glycemic index diet and taking all prescribed medications regularly without side effects.  Fasting sugars have been under less than 140 most of the time and post prandials have been under 160 except on rare occasions. Patient is exercising about 3 times per week and intentionally trying to lose weight .  Patient has had an eye exam in the last 12 months and checks feet regularly for signs of infection.  Patient does not walk barefoot outside,  And denies an numbness tingling or burning in feet. Patient is up to date on all recommended vaccinations  3) Sees  Dr Thurmond Butts  Psychiatry for years .    3) still having constipation. Averaging 3 stool weekly using bulk forming laxative and  stool softener,   Lab Results  Component Value Date   HGBA1C 5.9 10/02/2016    Outpatient Medications Prior to Visit  Medication Sig Dispense Refill  . Cholecalciferol (VITAMIN D3) 1000 UNITS CAPS Take 1 capsule by mouth daily.    . clobetasol ointment (TEMOVATE) [id number] % Apply 1 application topically 2 (two) times daily. 30 g 5  . cyanocobalamin 100 MCG tablet Take 100 mcg by mouth daily.      Marland Kitchen doxycycline (VIBRA-TABS) 100 MG tablet Take 1 tablet (100 mg total) by mouth 2 (two) times daily. 20 tablet 0  . ergocalciferol (DRISDOL) 50000 units capsule Take 1 capsule (50,000 Units total)  by mouth once a week. 4 capsule 3  . fluticasone (FLONASE) 50 MCG/ACT nasal spray Place 2 sprays into both nostrils daily. 16 g 4  . Fluticasone-Salmeterol (ADVAIR DISKUS) 100-50 MCG/DOSE AEPB ONE PUFF EVERY 12 HOURS AS DIRECTED. RINSE MOUTH AFTER USING 60 each 5  . Lactulose 20 GM/30ML SOLN 30 ml every 4 hours until constipation is relieved 236 mL 3  . levothyroxine (SYNTHROID, LEVOTHROID) 100 MCG tablet Take 1 tablet (100 mcg total) by mouth daily. 90 tablet 3  . losartan (COZAAR) 50 MG tablet Take 1 tablet (50 mg total) by mouth daily. 30 tablet 5  . meloxicam (MOBIC) 15 MG tablet Take 1 tablet (15 mg total) by mouth daily. 30 tablet 4  . sertraline (ZOLOFT) 50 MG tablet Take 50 mg by mouth daily.      . simvastatin (ZOCOR) 20 MG tablet Take 1 tablet (20 mg total) by mouth at bedtime. 90 tablet 3  . traMADol (ULTRAM) 50 MG tablet TAKE 1 TABLET BY MOUTH EVERY 6 HOURS AS NEEDED 120 tablet 0  . terbinafine (LAMISIL) 250 MG tablet Take 1 tablet (250 mg total) by mouth daily. 30 tablet 2   No facility-administered medications prior to visit.     Review of Systems;  Patient denies headache, fevers, malaise, unintentional weight loss, skin rash, eye pain, sinus congestion and sinus pain, sore throat, dysphagia,  hemoptysis , cough, dyspnea, wheezing, chest pain, palpitations, orthopnea, edema, abdominal pain, nausea, melena, diarrhea, constipation, flank pain, dysuria, hematuria, urinary  Frequency, nocturia, numbness, tingling, seizures,  Focal weakness, Loss of consciousness,  Tremor, insomnia, depression, anxiety, and suicidal ideation.      Objective:  BP 138/64   Pulse 76   Temp 98.3 F (36.8 C) (Oral)   Wt 153 lb 9.6 oz (69.7 kg)   SpO2 96%   BMI 26.37 kg/m   BP Readings from Last 3 Encounters:  11/03/16 138/64  10/03/16 130/70  08/29/16 136/79    Wt Readings from Last 3 Encounters:  11/03/16 153 lb 9.6 oz (69.7 kg)  10/03/16 157 lb (71.2 kg)  08/29/16 156 lb (70.8 kg)     General appearance: alert, cooperative and appears stated age Ears: normal TM's and external ear canals both ears Throat: lips, mucosa, and tongue normal; teeth and gums normal Neck: no adenopathy, no carotid bruit, supple, symmetrical, trachea midline and thyroid not enlarged, symmetric, no tenderness/mass/nodules Back: symmetric, no curvature. ROM normal. No CVA tenderness. Lungs: clear to auscultation bilaterally Heart: regular rate and rhythm, S1, S2 normal, no murmur, click, rub or gallop Abdomen: soft, non-tender; bowel sounds normal; no masses,  no organomegaly Pulses: 2+ and symmetric Skin: Skin color, texture, turgor normal. No rashes or lesions Lymph nodes: Cervical, supraclavicular, and axillary nodes normal.  Lab Results  Component Value Date   HGBA1C 5.9 10/02/2016   HGBA1C 5.9 06/04/2016   HGBA1C 6.1 02/18/2016    Lab Results  Component Value Date   CREATININE 0.72 10/02/2016   CREATININE 0.79 06/04/2016   CREATININE 0.70 02/18/2016    Lab Results  Component Value Date   WBC 7.6 10/02/2016   HGB 12.4 10/02/2016   HCT 36.6 10/02/2016   PLT 273.0 10/02/2016   GLUCOSE 101 (H) 10/02/2016   CHOL 143 10/02/2016   TRIG 69.0 10/02/2016   HDL 51.20 10/02/2016   LDLDIRECT 78.0 10/02/2016   LDLCALC 78 10/02/2016   ALT 10 10/02/2016   AST 14 10/02/2016   NA 137 10/02/2016   K 4.8 10/02/2016   CL 103 10/02/2016   CREATININE 0.72 10/02/2016   BUN 11 10/02/2016   CO2 31 10/02/2016   TSH 2.56 10/02/2016   INR 1.0 11/03/2014   HGBA1C 5.9 10/02/2016   MICROALBUR 0.9 10/02/2016    Dg Bone Density  Result Date: 10/15/2016 EXAM: DUAL X-RAY ABSORPTIOMETRY (DXA) FOR BONE MINERAL DENSITY IMPRESSION: Dear Dr. Derrel Nip, Your patient Jane Davidson completed a BMD test on 10/15/2016 using the [place] (analysis version: 14.10) manufactured by [employer]. The following summarizes the results of our evaluation. PATIENT BIOGRAPHICAL: Name: Jane, Davidson  Patient ID: [id number] Birth Date: January 29, 1941 Height: 64.0 in. Gender: Female Exam Date: 10/15/2016 Weight: 157.0 lbs. Indications: Advanced Age, Caucasian, Height Loss, Postmenopausal, Tobacco User (Current Smoker) Fractures: Treatments: LEVOTHYROXINE, simvastatin, Vit D ASSESSMENT: The BMD measured at Femur Neck Left is 0.770 g/cm2 with a T-score of -1.9. This patient is considered osteopenic according to [facility]) criteria. L-3 & 4 was excluded due to degenerative changes. Site Region Measured Measured WHO Young Adult BMD Date       Age      Classification T-score AP Spine L1-L2 10/15/2016 [age] Normal -0.6 1.105 g/cm2 DualFemur Neck Left 10/15/2016 [age] Osteopenia -1.9 0.770 g/cm2 World Health Organization [facility]) criteria for post-menopausal, Caucasian Women: Normal:       T-score at or above -1 SD Osteopenia:   T-score  between -1 and -2.5 SD Osteoporosis: T-score at or below -2.5 SD RECOMMENDATIONS: [place] recommends that FDA-approved medical therapies be considered in postmenopausal women and men age 35 or older with a: 1. Hip or vertebral (clinical or morphometric) fracture. 2. T-score of < -2.5 at the spine or hip. 3. Ten-year fracture probability by FRAX of 3% or greater for hip fracture or 20% or greater for major osteoporotic fracture. All treatment decisions require clinical judgment and consideration of individual patient factors, including patient preferences, co-morbidities, previous drug use, risk factors not captured in the FRAX model (e.g. falls, vitamin D deficiency, increased bone turnover, interval significant decline in bone density) and possible under - or over-estimation of fracture risk by FRAX. All patients should ensure an adequate intake of dietary calcium (1200 mg/d) and vitamin D (800 IU daily) unless contraindicated. FOLLOW-UP: People with diagnosed cases of osteoporosis or at high risk for fracture should have regular bone mineral density  tests. For patients eligible for Medicare, routine testing is allowed once every 2 years. The testing frequency can be increased to one year for patients who have rapidly progressing disease, those who are receiving or discontinuing medical therapy to restore bone mass, or have additional risk factors. I have reviewed this report, and agree with the above findings. [facility] Dear Dr. Derrel Nip, Your patient LAYLIE DAMASO completed a FRAX assessment on 10/15/2016 using the [place] (analysis version: 14.10) manufactured by [employer]. The following summarizes the results of our evaluation. PATIENT BIOGRAPHICAL: Name: Navie, Salvesen Patient ID: [id number] Birth Date: 1941/03/10 Height:    64.0 in. Gender:     Female    [age]       Weight:    157.0 lbs. Ethnicity:  White                            Exam Date: 10/15/2016 FRAX* RESULTS:  (version: 3.5) 10-year Probability of Fracture1 Major Osteoporotic Fracture2 Hip Fracture 13.5% 4.8% Population: [place] (Caucasian) Risk Factors: Tobacco User (Current Smoker) Based on Femur (Left) Neck BMD 1 -The 10-year probability of fracture may be lower than reported if the patient has received treatment. 2 -Major Osteoporotic Fracture: Clinical Spine, Forearm, Hip or Shoulder *FRAX is a Materials engineer of the [employer] for Metabolic Bone Disease, a [place] (WHO) [facility]. ASSESSMENT: The probability of a major osteoporotic fracture is 13.5% within the next ten years. The probability of a hip fracture is 4.8% within the next ten years. . Electronically Signed   By: Rolm Baptise M.D.   On: 10/15/2016 10:59   Mm Digital Screening Bilateral  Result Date: 10/15/2016 CLINICAL DATA:  Screening. EXAM: DIGITAL SCREENING BILATERAL MAMMOGRAM WITH CAD COMPARISON:  Previous exam(s). ACR Breast Density Category c: The breast tissue is heterogeneously dense, which may obscure small masses.  FINDINGS: There are no findings suspicious for malignancy. Images were processed with CAD. IMPRESSION: No mammographic evidence of malignancy. A result letter of this screening mammogram will be mailed directly to the patient. RECOMMENDATION: Screening mammogram in one year. (Code:SM-B-01Y) BI-RADS CATEGORY  1: Negative. Electronically Signed   By: Lillia Mountain M.D.   On: 10/15/2016 12:39    Assessment & Plan:   Problem List Items Addressed This Visit    Diabetes mellitus without complication ([place])     well-controlled on diet alone.  hemoglobin A1c has  been consistently at or  less than 7.0 checked every 6 months  . Patient is up-to-date on eye exams and foot exam is normal today. Patient has no proteinuria and is tolerating statin therapy for CAD risk reduction.  BP has been elevated on the last several visits,  Given her ongoing tobacco abuse, will recommended daily aspirin  81 mg and initiation of low dose losartan .  Lab Results  Component Value Date   HGBA1C 5.9 10/02/2016   Lab Results  Component Value Date   MICROALBUR 0.9 10/02/2016         Sinusitis, acute    Improving with antibiotics..Daily use of  Probiotic advised.          I have discontinued Ms. Fletes's terbinafine. I am also having her start on clotrimazole-betamethasone. Additionally, I am having her maintain her sertraline, cyanocobalamin, Vitamin D3, Lactulose, levothyroxine, Fluticasone-Salmeterol, simvastatin, doxycycline, fluticasone, clobetasol ointment, meloxicam, losartan, traMADol, and ergocalciferol.  Meds ordered this encounter  Medications  . clotrimazole-betamethasone (LOTRISONE) cream    Sig: Apply 1 application topically 2 (two) times daily.    Dispense:  30 g    Refill:  0    Medications Discontinued During This Encounter  Medication Reason  . terbinafine (LAMISIL) 250 MG tablet    A total of 25 minutes of face to face time was spent with patient more than half of which was spent in  counselling about the above mentioned conditions  and coordination of care    Follow-up: No Follow-up on file.   Crecencio Mc, MD

## 2016-11-04 DIAGNOSIS — J019 Acute sinusitis, unspecified: Secondary | ICD-10-CM | POA: Insufficient documentation

## 2016-11-04 NOTE — Assessment & Plan Note (Signed)
well-controlled on diet alone.  hemoglobin A1c has been consistently at or  less than 7.0 checked every 6 months  . Patient is up-to-date on eye exams and foot exam is normal today. Patient has no proteinuria and is tolerating statin therapy for CAD risk reduction.  BP has been elevated on the last several visits,  Given her ongoing tobacco abuse, will recommended daily aspirin  81 mg and initiation of low dose losartan .  Lab Results  Component Value Date   HGBA1C 5.9 10/02/2016   Lab Results  Component Value Date   MICROALBUR 0.9 10/02/2016

## 2016-11-04 NOTE — Assessment & Plan Note (Addendum)
Improving with antibiotics..Daily use of  Probiotic advised.

## 2016-11-22 ENCOUNTER — Other Ambulatory Visit: Payer: Self-pay | Admitting: Internal Medicine

## 2016-12-06 ENCOUNTER — Emergency Department
Admission: EM | Admit: 2016-12-06 | Discharge: 2016-12-06 | Disposition: A | Discharge status: Home / Self Care | Payer: PPO | Attending: Emergency Medicine | Admitting: Emergency Medicine

## 2016-12-06 ENCOUNTER — Encounter: Payer: Self-pay | Admitting: Emergency Medicine

## 2016-12-06 DIAGNOSIS — S20469A Insect bite (nonvenomous) of unspecified back wall of thorax, initial encounter: Secondary | ICD-10-CM | POA: Diagnosis not present

## 2016-12-06 DIAGNOSIS — Z79899 Other long term (current) drug therapy: Secondary | ICD-10-CM | POA: Diagnosis not present

## 2016-12-06 DIAGNOSIS — I10 Essential (primary) hypertension: Secondary | ICD-10-CM | POA: Insufficient documentation

## 2016-12-06 DIAGNOSIS — E039 Hypothyroidism, unspecified: Secondary | ICD-10-CM | POA: Diagnosis not present

## 2016-12-06 DIAGNOSIS — S30860A Insect bite (nonvenomous) of lower back and pelvis, initial encounter: Secondary | ICD-10-CM

## 2016-12-06 DIAGNOSIS — J441 Chronic obstructive pulmonary disease with (acute) exacerbation: Secondary | ICD-10-CM | POA: Diagnosis not present

## 2016-12-06 DIAGNOSIS — Y999 Unspecified external cause status: Secondary | ICD-10-CM | POA: Diagnosis not present

## 2016-12-06 DIAGNOSIS — Y939 Activity, unspecified: Secondary | ICD-10-CM | POA: Insufficient documentation

## 2016-12-06 DIAGNOSIS — Y929 Unspecified place or not applicable: Secondary | ICD-10-CM | POA: Insufficient documentation

## 2016-12-06 DIAGNOSIS — S20461A Insect bite (nonvenomous) of right back wall of thorax, initial encounter: Secondary | ICD-10-CM | POA: Insufficient documentation

## 2016-12-06 DIAGNOSIS — W57XXXA Bitten or stung by nonvenomous insect and other nonvenomous arthropods, initial encounter: Secondary | ICD-10-CM | POA: Insufficient documentation

## 2016-12-06 DIAGNOSIS — F17210 Nicotine dependence, cigarettes, uncomplicated: Secondary | ICD-10-CM | POA: Diagnosis not present

## 2016-12-06 NOTE — ED Notes (Signed)
NAD noted at time of D/C. Pt denies questions or concerns. Pt ambulatory to the lobby at this time.  

## 2016-12-06 NOTE — ED Triage Notes (Signed)
Pt states her sister told her today she had a tick her R side, pt states she went to pharmacy, pharmacist confirmed pt has a small tick to her R side.

## 2016-12-06 NOTE — ED Notes (Signed)
See this RN's triage note. Pt has tick to R side at this time.

## 2016-12-07 NOTE — ED Provider Notes (Signed)
[facility] Emergency Department Provider Note  ____________________________________________  Time seen: Approximately 8:43 PM  I have reviewed the triage vital signs and the nursing notes.   HISTORY  Chief Complaint Tick Removal   HPI Jane Davidson is a 76 y.o. female who presents to the emergency department for tick removal. She states that her friend noticed the tick on her back and she is unable to get it off. She is unsure of where she may have picked it up and unsure how long it has been there. She denies fever or other associated symptoms.  Past Medical History:  Diagnosis Date  . Abnormal Pap smear of cervix 2001   Biopsy normal  . Bursitis NEC   . COPD (chronic obstructive pulmonary disease) ([place])   . History of peripheral vascular disease 2001   s/p bilateral aorto-iliac-femoral bypass /[facility]ke  . Hyperlipidemia   . hypothyroid   . Osteopenia 2009    T scores - 1.5 DEXA 2009  . Screening for breast cancer May 2012   [facility] mammogram  . Tobacco abuse disorder   . Vitamin D deficiency April 2011   replaced withDrsido for level of 11.3 ng/ml  . Vitamin D deficiency April 2011   replaced withDrsido for level of 11.3 ng/ml    Patient Active Problem List   Diagnosis Date Noted  . Sinusitis, acute 11/04/2016  . Carotid stenosis 08/29/2016  . Personal history of tobacco use, presenting hazards to health 06/18/2016  . Essential hypertension 02/29/2016  . Diabetes mellitus without complication ([place]) [id number]  . Onychomycosis of left great toe 04/01/2015  . Constipation 06/08/2014  . Anemia 09/27/2013  . Routine general medical examination at a health care facility 03/27/2013  . COPD exacerbation ([place]) 01/02/2013  . Screening for colon cancer 10/16/2011  . Tobacco abuse counseling 08/09/2011  . History of peripheral vascular disease   . Bursitis, ischial   . Osteopenia   . Tobacco abuse disorder   . Vitamin D deficiency   . Arrhythmia  07/05/2011  . Hypothyroidism 07/05/2011  . 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 COPD (chronic obstructive pulmonary disease) ([place])   . Hyperlipidemia   . Screening for breast cancer 02/11/2011    Past Surgical History:  Procedure Laterality Date  . BREAST BIOPSY Left    benign  . CATARACT EXTRACTION  Sept 2011  . CHOLECYSTECTOMY    . HERNIA REPAIR  Jun 2011   [name]   . TUBAL LIGATION      Prior to Admission medications   Medication Sig Start Date End Date Taking? Authorizing Provider  Cholecalciferol (VITAMIN D3) 1000 UNITS CAPS Take 1 capsule by mouth daily.    Historical Provider, MD  clobetasol ointment (TEMOVATE) [id number] % Apply 1 application topically 2 (two) times daily. 05/23/16   Crecencio Mc, MD  clotrimazole-betamethasone (LOTRISONE) cream Apply 1 application topically 2 (two) times daily. 11/03/16   Crecencio Mc, MD  cyanocobalamin 100 MCG tablet Take 100 mcg by mouth daily.      Historical Provider, MD  doxycycline (VIBRA-TABS) 100 MG tablet Take 1 tablet (100 mg total) by mouth 2 (two) times daily. 02/29/16   Coral Spikes, DO  ergocalciferol (DRISDOL) 50000 units capsule Take 1 capsule (50,000 Units total) by mouth once a week. 10/05/16   Crecencio Mc, MD  fluticasone (FLONASE) 50 MCG/ACT nasal spray Place 2 sprays into both nostrils daily. 04/17/16   Crecencio Mc, MD  Fluticasone-Salmeterol (  ADVAIR DISKUS) 100-50 MCG/DOSE AEPB ONE PUFF EVERY 12 HOURS AS DIRECTED. RINSE MOUTH AFTER USING 12/03/15   Crecencio Mc, MD  Lactulose 20 GM/30ML SOLN 30 ml every 4 hours until constipation is relieved 11/08/15   Crecencio Mc, MD  losartan (COZAAR) 50 MG tablet TAKE 1 TABLET(50 MG) BY MOUTH DAILY 11/25/16   Crecencio Mc, MD  meloxicam (MOBIC) 15 MG tablet Take 1 tablet (15 mg total) by mouth daily. 05/23/16   Crecencio Mc, MD  sertraline (ZOLOFT) 50 MG tablet Take 50 mg by mouth daily.      Historical Provider, MD  simvastatin (ZOCOR) 20 MG  tablet Take 1 tablet (20 mg total) by mouth at bedtime. 02/26/16   Crecencio Mc, MD  SYNTHROID 100 MCG tablet TAKE 1 TABLET BY MOUTH EVERY DAY 11/25/16   Crecencio Mc, MD  traMADol (ULTRAM) 50 MG tablet TAKE 1 TABLET BY MOUTH EVERY 6 HOURS AS NEEDED 10/08/16   Crecencio Mc, MD    Allergies Patient has no known allergies.  Family History  Problem Relation Age of Onset  . Hypertension Mother   . Arthritis Mother   . Diabetes Father     Social History Social History  Substance Use Topics  . Smoking status: Current Every Day Smoker    Packs/day: 0.75    Years: 40.00    Types: Cigarettes  . Smokeless tobacco: Never Used  . Alcohol use No    Review of Systems  Constitutional: Negative for fever/chills Respiratory: Negative for shortness of breath. Musculoskeletal: Negative for pain. Skin: Positive for embedded tick on right lateral thorax. Neurological: Negative for headaches, focal weakness or numbness. ____________________________________________   PHYSICAL EXAM:  VITAL SIGNS: ED Triage Vitals  Enc Vitals Group     BP 12/06/16 2047 136/63     Pulse Rate 12/06/16 2047 98     Resp 12/06/16 2047 18     Temp 12/06/16 2047 98.2 F (36.8 C)     Temp Source 12/06/16 2047 Oral     SpO2 12/06/16 2047 98 %     Weight 12/06/16 2048 150 lb (68 kg)     Height 12/06/16 2048 5\' 4"  (1.626 m)     Head Circumference --      Peak Flow --      Pain Score --      Pain Loc --      Pain Edu? --      Excl. in [place]? --      Constitutional: Alert and oriented. Well appearing and in no acute distress. Eyes: Conjunctivae are normal. EOMI. Nose: No congestion/rhinnorhea. Mouth/Throat: Mucous membranes are moist.   Neck: No stridor. Cardiovascular: Good peripheral circulation. Respiratory: Normal respiratory effort.  No retractions. Musculoskeletal: FROM throughout. Neurologic:  Normal speech and language. No gross focal neurologic deficits are appreciated. Skin:  Small, brown  tick embedded in right lateral thorax. Localized erythema surrounding site.   ____________________________________________   LABS (all labs ordered are listed, but only abnormal results are displayed)  Labs Reviewed - No data to display ____________________________________________  EKG   ____________________________________________  RADIOLOGY   ____________________________________________   PROCEDURES  Procedure(s) performed: Tick removed intact with tweezers. Skin cleansed with alcohol after removal. ____________________________________________   INITIAL IMPRESSION / ASSESSMENT AND PLAN / ED COURSE  76 year old female presenting to the emergency department for tick removal. Tick was removed intact and the patient was given in regarding tick borne illnesses upon discharge. No indication to start antibiotics  tonight. Patient was encouraged to follow up with her primary care provider for symptoms of concern or return to the emergency department if she is unable schedule an appointment.  Pertinent labs & imaging results that were available during my care of the patient were reviewed by me and considered in my medical decision making (see chart for details).  ____________________________________________   FINAL CLINICAL IMPRESSION(S) / ED DIAGNOSES  Final diagnoses:  Tick bite of back, initial encounter    Discharge Medication List as of 12/06/2016  8:56 PM      Note:  This document was prepared using Dragon voice recognition software and may include unintentional dictation errors.    Victorino Dike, FNP 12/07/16 1743    Earleen Newport, MD 12/08/16 [phone]

## 2016-12-16 ENCOUNTER — Other Ambulatory Visit: Payer: Self-pay | Admitting: Internal Medicine

## 2016-12-25 ENCOUNTER — Other Ambulatory Visit (INDEPENDENT_AMBULATORY_CARE_PROVIDER_SITE_OTHER): Payer: PPO

## 2016-12-25 ENCOUNTER — Telehealth: Payer: Self-pay | Admitting: Internal Medicine

## 2016-12-25 DIAGNOSIS — R300 Dysuria: Secondary | ICD-10-CM

## 2016-12-25 NOTE — Telephone Encounter (Signed)
Tried to get patient to go to UC patient due to PCP out of office, patient refused consulted with another provider since patient insisted on dropping off urine even when I told her she would not be treated until culture return or unless PCP approved. Orders in.

## 2016-12-25 NOTE — Telephone Encounter (Signed)
Checked for urine results.  No results in chart.  It appears only microscopic and culture were run.  Spoke to pt.  She is still having some dysuria and discomfort with urination.  Drinking fluids.  Ok tonight.  Told her we would try to follow up with microscopic results in the am.  No medication called in tonight.

## 2016-12-25 NOTE — Telephone Encounter (Signed)
Pt called asking if she could come in to leave a urine sample. She is c/o unable to fully relieve herself and burning upon urination. Please advise, thank you!  Call pt @ [phone]

## 2016-12-26 LAB — POCT URINALYSIS DIPSTICK
Bilirubin, UA: NEGATIVE
Glucose, UA: NEGATIVE
Nitrite, UA: NEGATIVE
PROTEIN UA: 100
SPEC GRAV UA: 1.02 (ref 1.030–1.035)
UROBILINOGEN UA: 0.2 (ref ?–2.0)
pH, UA: 7 (ref 5.0–8.0)

## 2016-12-26 LAB — URINALYSIS, MICROSCOPIC ONLY

## 2016-12-26 MED ORDER — CIPROFLOXACIN HCL 250 MG PO TABS: 250.0000 mg | ORAL_TABLET | Freq: Two times a day (BID) | ORAL | 0 refills | Status: DC

## 2016-12-26 NOTE — Telephone Encounter (Signed)
IF SHE IS UNCOMFORTABLE I WILL TREAT WITHOUT WAITING FOR CULTURE SINCE SHE APPEARS TO HAVE UTI LET ME KNOW   CIPRO 250  MG TIWCE DAILY X 5 DAYS  CAN CALL IN

## 2016-12-26 NOTE — Telephone Encounter (Signed)
Urine dip not in last night when I talked with pt.  She was ok to wait until today for results.  Microscopic still pending.  Did not know if you wanted to treat.  Let me know if I need to do something.

## 2016-12-26 NOTE — Telephone Encounter (Signed)
POCT dip in chart now.

## 2016-12-26 NOTE — Telephone Encounter (Signed)
Patient feels she is not emptying her bladder, frequency, burning with urination. Sent Cipro to [facility] as ordered. FYI

## 2016-12-28 ENCOUNTER — Encounter: Payer: Self-pay | Admitting: Internal Medicine

## 2016-12-28 LAB — URINE CULTURE

## 2017-03-18 ENCOUNTER — Other Ambulatory Visit: Payer: Self-pay | Admitting: Internal Medicine

## 2017-03-27 ENCOUNTER — Other Ambulatory Visit: Payer: Self-pay | Admitting: Internal Medicine

## 2017-03-30 NOTE — Telephone Encounter (Signed)
Last OV was 11/03/16, last refill was 10/08/16, #120 no refills, please advise, thanks

## 2017-03-31 ENCOUNTER — Other Ambulatory Visit: Payer: Self-pay

## 2017-03-31 DIAGNOSIS — E034 Atrophy of thyroid (acquired): Secondary | ICD-10-CM

## 2017-03-31 DIAGNOSIS — E785 Hyperlipidemia, unspecified: Secondary | ICD-10-CM

## 2017-03-31 DIAGNOSIS — E119 Type 2 diabetes mellitus without complications: Secondary | ICD-10-CM

## 2017-03-31 DIAGNOSIS — I10 Essential (primary) hypertension: Secondary | ICD-10-CM

## 2017-03-31 MED ORDER — TRAMADOL HCL 50 MG PO TABS: 50.0000 mg | ORAL_TABLET | Freq: Four times a day (QID) | ORAL | 0 refills | Status: DC | PRN

## 2017-03-31 NOTE — Telephone Encounter (Signed)
Pt has been scheduled for a follow up appt and a fasting lab appt prior to appt with Dr. Derrel Nip. The pt is aware of appt date and time.

## 2017-03-31 NOTE — Telephone Encounter (Signed)
Refilled: 10/08/2016 Last OV: 11/03/2016 Next OV: 10/07/2017

## 2017-03-31 NOTE — Addendum Note (Signed)
Addended by: Adair Laundry on: 03/31/2017 10:17 AM   Modules accepted: Orders

## 2017-03-31 NOTE — Telephone Encounter (Signed)
Printed, signed and faxed.  

## 2017-03-31 NOTE — Telephone Encounter (Signed)
Refill for 30 days only.  OFFICE VISIT NEEDED prior to any more refills on controlled substances. Please schedule.

## 2017-04-08 DIAGNOSIS — F3342 Major depressive disorder, recurrent, in full remission: Secondary | ICD-10-CM | POA: Diagnosis not present

## 2017-04-22 ENCOUNTER — Encounter: Payer: Self-pay | Admitting: Family

## 2017-04-22 ENCOUNTER — Ambulatory Visit (INDEPENDENT_AMBULATORY_CARE_PROVIDER_SITE_OTHER): Payer: PPO | Admitting: Family

## 2017-04-22 VITALS — BP 146/60 | HR 96 | Temp 98.4°F | Ht 64.0 in | Wt 151.0 lb

## 2017-04-22 DIAGNOSIS — L209 Atopic dermatitis, unspecified: Secondary | ICD-10-CM | POA: Insufficient documentation

## 2017-04-22 MED ORDER — HYDROXYZINE HCL 25 MG PO TABS
25.0000 mg | ORAL_TABLET | Freq: Every evening | ORAL | 0 refills | Status: DC | PRN
Start: 2017-04-22 — End: 2017-10-07

## 2017-04-22 NOTE — Progress Notes (Signed)
Subjective:    Patient ID: [name], female    DOB: [birth year], 76 y.o.   MRN: [id number]  CC: JAQUILA SANTELLI is a 76 y.o. female who presents today for an acute visit.    HPI: CC: rash on both arms x 3 weeks, unchanged.   Started on right forearm.Scabs. No purulent drainage. No white exudate. States washing dishes warm water. Tries to remember to use gloves Also notes itchy under pannus- no rash.   Very itchy. Has tried lotrisone and eucerin with some relief. Hasn't used temovate as too expensive.  No cp, fever, myalgias.   No new lotions, creams.   Seasonal allergies- takes allegra but not consisently.   H/o eczema    ED 11/2016 for tick removal. No medications at that time. Per patient no rash or fever after tick removed.  HISTORY:  Past Medical History:  Diagnosis Date  . Abnormal Pap smear of cervix 2001   Biopsy normal  . Bursitis NEC   . COPD (chronic obstructive pulmonary disease) ([place])   . History of peripheral vascular disease 2001   s/p bilateral aorto-iliac-femoral bypass /[facility]  . Hyperlipidemia   . hypothyroid   . Osteopenia 2009    T scores - 1.5 DEXA 2009  . Screening for breast cancer May 2012   [facility] mammogram  . Tobacco abuse disorder   . Vitamin D deficiency April 2011   replaced withDrsido for level of 11.3 ng/ml  . Vitamin D deficiency April 2011   replaced withDrsido for level of 11.3 ng/ml   Past Surgical History:  Procedure Laterality Date  . BREAST BIOPSY Left    benign  . CATARACT EXTRACTION  Sept 2011  . CHOLECYSTECTOMY    . HERNIA REPAIR  Jun 2011   [name]   . TUBAL LIGATION     Family History  Problem Relation Age of Onset  . Hypertension Mother   . Arthritis Mother   . Diabetes Father     Allergies: Pollen extract Current Outpatient Prescriptions on File Prior to Visit  Medication Sig Dispense Refill  . ADVAIR DISKUS 100-50 MCG/DOSE AEPB USE 1 INHALATION BY MOUTH TWICE DAILY 14 each 3  . Cholecalciferol  (VITAMIN D3) 1000 UNITS CAPS Take 1 capsule by mouth daily.    . ciprofloxacin (CIPRO) 250 MG tablet Take 1 tablet (250 mg total) by mouth 2 (two) times daily. 10 tablet 0  . clobetasol ointment (TEMOVATE) 3.79 % Apply 1 application topically 2 (two) times daily. 30 g 5  . clotrimazole-betamethasone (LOTRISONE) cream APPLY EXTERNALLY TO THE AFFECTED AREA TWICE DAILY 30 g 0  . cyanocobalamin 100 MCG tablet Take 100 mcg by mouth daily.      Marland Kitchen doxycycline (VIBRA-TABS) 100 MG tablet Take 1 tablet (100 mg total) by mouth 2 (two) times daily. 20 tablet 0  . ergocalciferol (DRISDOL) 50000 units capsule Take 1 capsule (50,000 Units total) by mouth once a week. 4 capsule 3  . fluticasone (FLONASE) 50 MCG/ACT nasal spray Place 2 sprays into both nostrils daily. 16 g 4  . Lactulose 20 GM/30ML SOLN 30 ml every 4 hours until constipation is relieved 236 mL 3  . losartan (COZAAR) 50 MG tablet TAKE 1 TABLET(50 MG) BY MOUTH DAILY 90 tablet 3  . meloxicam (MOBIC) 15 MG tablet Take 1 tablet (15 mg total) by mouth daily. 30 tablet 4  . sertraline (ZOLOFT) 50 MG tablet Take 50 mg by mouth daily.      . simvastatin (  ZOCOR) 20 MG tablet TAKE 1 TABLET BY MOUTH DAILY 90 tablet 0  . SYNTHROID 100 MCG tablet TAKE 1 TABLET BY MOUTH EVERY DAY 90 tablet 3  . traMADol (ULTRAM) 50 MG tablet Take 1 tablet (50 mg total) by mouth every 6 (six) hours as needed. 120 tablet 0   No current facility-administered medications on file prior to visit.     Social History  Substance Use Topics  . Smoking status: Current Every Day Smoker    Packs/day: 0.75    Years: 40.00    Types: Cigarettes  . Smokeless tobacco: Never Used  . Alcohol use No    Review of Systems  Constitutional: Negative for chills and fever.  Respiratory: Negative for cough.   Cardiovascular: Negative for chest pain and palpitations.  Gastrointestinal: Negative for nausea and vomiting.  Musculoskeletal: Negative for myalgias.  Skin: Positive for rash.        Objective:    BP (!) 146/60   Pulse 96   Temp 98.4 F (36.9 C) (Oral)   Ht 5\' 4"  (1.626 m)   Wt 151 lb (68.5 kg)   SpO2 96%   BMI 25.92 kg/m    Physical Exam  Constitutional: She appears well-developed and well-nourished.  Eyes: Conjunctivae are normal.  Cardiovascular: Normal rate, regular rhythm, normal heart sounds and normal pulses.   Pulmonary/Chest: Effort normal and breath sounds normal. She has no wheezes. She has no rhonchi. She has no rales.  Neurological: She is alert.  Skin: Skin is warm and dry. Lesion noted.  Scabbed over excoriated lesions noted bilateral forearms proximal to wrist. No purulent discharge, shakiness, increased warmth. No  No rash, lesions observed or erythema under pannus.  Psychiatric: She has a normal mood and affect. Her speech is normal and behavior is normal. Thought content normal.  Vitals reviewed.      Assessment & Plan:   Problem List Items Addressed This Visit      Musculoskeletal and Integument   Atopic dermatitis - Primary    Symptoms most consistent with atopic dermatitis. Discussed at great length  lifestyle measures the patient can take including wearing gloves when washing dishes, short, tepid showers, emollients. Since itching is worse at bedtime, I agreed to do a low-dose Atarax and counseled patient not using any other over-the-counter antihistamines. She understands medication can make her drowsy and will use caution. Return precautions given.      Relevant Medications   hydrOXYzine (ATARAX/VISTARIL) 25 MG tablet         I am having Ms. Makela start on hydrOXYzine. I am also having her maintain her sertraline, cyanocobalamin, Vitamin D3, Lactulose, doxycycline, fluticasone, clobetasol ointment, meloxicam, ergocalciferol, SYNTHROID, losartan, ADVAIR DISKUS, ciprofloxacin, simvastatin, clotrimazole-betamethasone, and traMADol.   Meds ordered this encounter  Medications  . hydrOXYzine (ATARAX/VISTARIL) 25 MG tablet     Sig: Take 1 tablet (25 mg total) by mouth at bedtime as needed (insomnia). 30-60 minutes prior to bedtime for itching.    Dispense:  60 tablet    Refill:  0    Order Specific Question:   Supervising Provider    Answer:   Crecencio Mc [[id number]]    Return precautions given.   Risks, benefits, and alternatives of the medications and treatment plan prescribed today were discussed, and patient expressed understanding.   Education regarding symptom management and diagnosis given to patient on AVS.  Continue to follow with Crecencio Mc, MD for routine health maintenance.   [name] and I  agreed with plan.   Mable Paris, FNP

## 2017-04-22 NOTE — Patient Instructions (Addendum)
Wear gloves when washing dishes  Keep showers more lukewarm, rather short  Stop lotrisome as doesn't appear to be fungal.   Apply vaseline or Eucerin ointment after shower to lock moisture in.   May take atarax at bedtime for itching as needed. Be careful as can make you drowsy. No alcohol with this medication.   DO NOT take allegra, claritin, benadryl, zyrtec when taking atarax as these medications are similar.     Eczema Eczema, also called atopic dermatitis, is a skin disorder that causes inflammation of the skin. It causes a red rash and dry, scaly skin. The skin becomes very itchy. Eczema is generally worse during the cooler winter months and often improves with the warmth of summer. Eczema usually starts showing signs in infancy. Some children outgrow eczema, but it may last through adulthood. What are the causes? The exact cause of eczema is not known, but it appears to run in families. People with eczema often have a family history of eczema, allergies, asthma, or hay fever. Eczema is not contagious. Flare-ups of the condition may be caused by:  Contact with something you are sensitive or allergic to.  Stress.  What are the signs or symptoms?  Dry, scaly skin.  Red, itchy rash.  Itchiness. This may occur before the skin rash and may be very intense. How is this diagnosed? The diagnosis of eczema is usually made based on symptoms and medical history. How is this treated? Eczema cannot be cured, but symptoms usually can be controlled with treatment and other strategies. A treatment plan might include:  Controlling the itching and scratching. ? Use over-the-counter antihistamines as directed for itching. This is especially useful at night when the itching tends to be worse. ? Use over-the-counter steroid creams as directed for itching. ? Avoid scratching. Scratching makes the rash and itching worse. It may also result in a skin infection (impetigo) due to a break in the  skin caused by scratching.  Keeping the skin well moisturized with creams every day. This will seal in moisture and help prevent dryness. Lotions that contain alcohol and water should be avoided because they can dry the skin.  Limiting exposure to things that you are sensitive or allergic to (allergens).  Recognizing situations that cause stress.  Developing a plan to manage stress.  Follow these instructions at home:  Only take over-the-counter or prescription medicines as directed by your health care provider.  Do not use anything on the skin without checking with your health care provider.  Keep baths or showers short (5 minutes) in warm (not hot) water. Use mild cleansers for bathing. These should be unscented. You may add nonperfumed bath oil to the bath water. It is best to avoid soap and bubble bath.  Immediately after a bath or shower, when the skin is still damp, apply a moisturizing ointment to the entire body. This ointment should be a petroleum ointment. This will seal in moisture and help prevent dryness. The thicker the ointment, the better. These should be unscented.  Keep fingernails cut short. Children with eczema may need to wear soft gloves or mittens at night after applying an ointment.  Dress in clothes made of cotton or cotton blends. Dress lightly, because heat increases itching.  A child with eczema should stay away from anyone with fever blisters or cold sores. The virus that causes fever blisters (herpes simplex) can cause a serious skin infection in children with eczema. Contact a health care provider if:  Your itching  interferes with sleep.  Your rash gets worse or is not better within 1 week after starting treatment.  You see pus or soft yellow scabs in the rash area.  You have a fever.  You have a rash flare-up after contact with someone who has fever blisters. This information is not intended to replace advice given to you by your health care  provider. Make sure you discuss any questions you have with your health care provider. Document Released: 09/26/2000 Document Revised: 03/06/2016 Document Reviewed: 05/02/2013 Elsevier Interactive Patient Education  2017 [employer].

## 2017-04-22 NOTE — Progress Notes (Signed)
fdPre visit review using our clinic review tool, if applicable. No additional management support is needed unless otherwise documented below in the visit note.

## 2017-04-22 NOTE — Assessment & Plan Note (Signed)
Symptoms most consistent with atopic dermatitis. Discussed at great length  lifestyle measures the patient can take including wearing gloves when washing dishes, short, tepid showers, emollients. Since itching is worse at bedtime, I agreed to do a low-dose Atarax and counseled patient not using any other over-the-counter antihistamines. She understands medication can make her drowsy and will use caution. Return precautions given.

## 2017-04-23 ENCOUNTER — Telehealth: Payer: Self-pay

## 2017-04-23 NOTE — Telephone Encounter (Signed)
PA approved from 04/23/2017 till 10/12/2017, thanks

## 2017-04-23 NOTE — Telephone Encounter (Signed)
PA for Hydroxyzine completed on cover my meds.

## 2017-05-07 ENCOUNTER — Other Ambulatory Visit (INDEPENDENT_AMBULATORY_CARE_PROVIDER_SITE_OTHER): Payer: PPO

## 2017-05-07 DIAGNOSIS — E119 Type 2 diabetes mellitus without complications: Secondary | ICD-10-CM

## 2017-05-07 DIAGNOSIS — E785 Hyperlipidemia, unspecified: Secondary | ICD-10-CM | POA: Diagnosis not present

## 2017-05-07 DIAGNOSIS — I10 Essential (primary) hypertension: Secondary | ICD-10-CM | POA: Diagnosis not present

## 2017-05-07 DIAGNOSIS — E034 Atrophy of thyroid (acquired): Secondary | ICD-10-CM | POA: Diagnosis not present

## 2017-05-07 LAB — COMPREHENSIVE METABOLIC PANEL
ALT: 10 U/L (ref 0–35)
AST: 15 U/L (ref 0–37)
Albumin: 3.9 g/dL (ref 3.5–5.2)
Alkaline Phosphatase: 53 U/L (ref 39–117)
BILIRUBIN TOTAL: 0.6 mg/dL (ref 0.2–1.2)
BUN: 11 mg/dL (ref 6–23)
CO2: 27 meq/L (ref 19–32)
Calcium: 9.3 mg/dL (ref 8.4–10.5)
Chloride: 101 mEq/L (ref 96–112)
Creatinine, Ser: 0.77 mg/dL (ref 0.40–1.20)
GFR: 77.76 mL/min (ref >60.00)
GLUCOSE: 115 mg/dL — AB (ref 70–99)
Potassium: 4.4 mEq/L (ref 3.5–5.1)
SODIUM: 133 meq/L — AB (ref 135–145)
TOTAL PROTEIN: 7 g/dL (ref 6.0–8.3)

## 2017-05-07 LAB — HEMOGLOBIN A1C: Hgb A1c MFr Bld: 5.8 % (ref 4.6–6.5)

## 2017-05-07 LAB — LIPID PANEL
CHOL/HDL RATIO: 2
Cholesterol: 131 mg/dL (ref 0–200)
HDL: 54.3 mg/dL (ref >39.00)
LDL Cholesterol: 61 mg/dL (ref 0–99)
NONHDL: 77.04
TRIGLYCERIDES: 80 mg/dL (ref 0.0–149.0)
VLDL: 16 mg/dL (ref 0.0–40.0)

## 2017-05-07 LAB — LDL CHOLESTEROL, DIRECT: LDL DIRECT: 69 mg/dL

## 2017-05-07 LAB — TSH: TSH: 0.96 u[IU]/mL (ref 0.35–4.50)

## 2017-05-10 ENCOUNTER — Encounter: Payer: Self-pay | Admitting: Internal Medicine

## 2017-05-15 ENCOUNTER — Encounter: Payer: Self-pay | Admitting: Internal Medicine

## 2017-05-15 ENCOUNTER — Ambulatory Visit (INDEPENDENT_AMBULATORY_CARE_PROVIDER_SITE_OTHER): Payer: PPO | Admitting: Internal Medicine

## 2017-05-15 DIAGNOSIS — E119 Type 2 diabetes mellitus without complications: Secondary | ICD-10-CM

## 2017-05-15 DIAGNOSIS — E785 Hyperlipidemia, unspecified: Secondary | ICD-10-CM | POA: Diagnosis not present

## 2017-05-15 DIAGNOSIS — L209 Atopic dermatitis, unspecified: Secondary | ICD-10-CM | POA: Diagnosis not present

## 2017-05-15 DIAGNOSIS — Z720 Tobacco use: Secondary | ICD-10-CM | POA: Diagnosis not present

## 2017-05-15 DIAGNOSIS — I10 Essential (primary) hypertension: Secondary | ICD-10-CM | POA: Diagnosis not present

## 2017-05-15 MED ORDER — SERTRALINE HCL 50 MG PO TABS: 50.0000 mg | ORAL_TABLET | Freq: Every day | ORAL | 1 refills | Status: DC

## 2017-05-15 MED ORDER — TRIAMCINOLONE ACETONIDE 0.1 % EX CREA: 1.0000 "application " | TOPICAL_CREAM | Freq: Two times a day (BID) | CUTANEOUS | 5 refills | Status: DC

## 2017-05-15 NOTE — Progress Notes (Signed)
Subjective:  Patient ID: Jane Davidson, female    DOB: 02/16/1941  Age: 76 y.o. MRN: [id number]  CC: Diagnoses of Diabetes mellitus without complication ([place]), Essential hypertension, Hyperlipidemia, unspecified hyperlipidemia type, Atopic dermatitis, unspecified type, and Tobacco abuse disorder were pertinent to this visit.  HPI Jane Davidson presents for follow up on type 2 diabetes mellitus diagnosed in 2015 with a  fasting glucose of 129 , a1c of 6.2 . . Patient does not check blood sugars more than once a month,  Last one was 135 a month ago in a fasting state.  Dos not recall any above 200 lr less than 80.  No complaints today.  Following a low glycemic index diet.  Not exercising on a regular basis or trying to lose weight.   Denies numbness and tingling in lower extremities.  Denies hypoglycemic symptoms.   Still smoking .    Skin itching,  Mostly arms and legs,  No rash, bug bites, or contact with poison ivy. Using a scented moisturizer,  Did not like eucerin bc it was too thick.    Lab Results  Component Value Date   HGBA1C 5.8 05/07/2017     Outpatient Medications Prior to Visit  Medication Sig Dispense Refill  . ADVAIR DISKUS 100-50 MCG/DOSE AEPB USE 1 INHALATION BY MOUTH TWICE DAILY 14 each 3  . Cholecalciferol (VITAMIN D3) 1000 UNITS CAPS Take 1 capsule by mouth daily.    . clobetasol ointment (TEMOVATE) 4.27 % Apply 1 application topically 2 (two) times daily. 30 g 5  . cyanocobalamin 100 MCG tablet Take 100 mcg by mouth daily.      . fluticasone (FLONASE) 50 MCG/ACT nasal spray Place 2 sprays into both nostrils daily. 16 g 4  . hydrOXYzine (ATARAX/VISTARIL) 25 MG tablet Take 1 tablet (25 mg total) by mouth at bedtime as needed (insomnia). 30-60 minutes prior to bedtime for itching. 60 tablet 0  . Lactulose 20 GM/30ML SOLN 30 ml every 4 hours until constipation is relieved 236 mL 3  . losartan (COZAAR) 50 MG tablet TAKE 1 TABLET(50 MG) BY MOUTH DAILY 90 tablet 3    . meloxicam (MOBIC) 15 MG tablet Take 1 tablet (15 mg total) by mouth daily. 30 tablet 4  . simvastatin (ZOCOR) 20 MG tablet TAKE 1 TABLET BY MOUTH DAILY 90 tablet 0  . SYNTHROID 100 MCG tablet TAKE 1 TABLET BY MOUTH EVERY DAY 90 tablet 3  . traMADol (ULTRAM) 50 MG tablet Take 1 tablet (50 mg total) by mouth every 6 (six) hours as needed. 120 tablet 0  . ciprofloxacin (CIPRO) 250 MG tablet Take 1 tablet (250 mg total) by mouth 2 (two) times daily. 10 tablet 0  . clotrimazole-betamethasone (LOTRISONE) cream APPLY EXTERNALLY TO THE AFFECTED AREA TWICE DAILY 30 g 0  . doxycycline (VIBRA-TABS) 100 MG tablet Take 1 tablet (100 mg total) by mouth 2 (two) times daily. 20 tablet 0  . ergocalciferol (DRISDOL) 50000 units capsule Take 1 capsule (50,000 Units total) by mouth once a week. 4 capsule 3  . sertraline (ZOLOFT) 50 MG tablet Take 50 mg by mouth daily.       No facility-administered medications prior to visit.     Review of Systems;  Patient denies headache, fevers, malaise, unintentional weight loss, skin rash, eye pain, sinus congestion and sinus pain, sore throat, dysphagia,  hemoptysis , cough, dyspnea, wheezing, chest pain, palpitations, orthopnea, edema, abdominal pain, nausea, melena, diarrhea, constipation, flank pain, dysuria, hematuria, urinary  Frequency,  nocturia, numbness, tingling, seizures,  Focal weakness, Loss of consciousness,  Tremor, insomnia, depression, anxiety, and suicidal ideation.      Objective:  BP 124/70 (BP Location: Left Arm, Patient Position: Sitting, Cuff Size: Normal)   Pulse 73   Temp 98.1 F (36.7 C) (Oral)   Resp 15   Ht 5\' 4"  (1.626 m)   Wt 150 lb (68 kg)   SpO2 98%   BMI 25.75 kg/m   BP Readings from Last 3 Encounters:  05/15/17 124/70  04/22/17 (!) 146/60  12/06/16 136/63    Wt Readings from Last 3 Encounters:  05/15/17 150 lb (68 kg)  04/22/17 151 lb (68.5 kg)  12/06/16 150 lb (68 kg)    General appearance: alert, cooperative and  appears stated age Ears: normal TM's and external ear canals both ears Throat: lips, mucosa, and tongue normal; teeth and gums normal Neck: no adenopathy, no carotid bruit, supple, symmetrical, trachea midline and thyroid not enlarged, symmetric, no tenderness/mass/nodules Back: symmetric, no curvature. ROM normal. No CVA tenderness. Lungs: clear to auscultation bilaterally Heart: regular rate and rhythm, S1, S2 normal, no murmur, click, rub or gallop Abdomen: soft, non-tender; bowel sounds normal; no masses,  no organomegaly Pulses: 2+ and symmetric Skin: Skin color, texture, turgor normal. No rashes or lesions Lymph nodes: Cervical, supraclavicular, and axillary nodes normal.  Lab Results  Component Value Date   HGBA1C 5.8 05/07/2017   HGBA1C 5.9 10/02/2016   HGBA1C 5.9 06/04/2016    Lab Results  Component Value Date   CREATININE 0.77 05/07/2017   CREATININE 0.72 10/02/2016   CREATININE 0.79 06/04/2016    Lab Results  Component Value Date   WBC 7.6 10/02/2016   HGB 12.4 10/02/2016   HCT 36.6 10/02/2016   PLT 273.0 10/02/2016   GLUCOSE 115 (H) 05/07/2017   CHOL 131 05/07/2017   TRIG 80.0 05/07/2017   HDL 54.30 05/07/2017   LDLDIRECT 69.0 05/07/2017   LDLCALC 61 05/07/2017   ALT 10 05/07/2017   AST 15 05/07/2017   NA 133 (L) 05/07/2017   K 4.4 05/07/2017   CL 101 05/07/2017   CREATININE 0.77 05/07/2017   BUN 11 05/07/2017   CO2 27 05/07/2017   TSH 0.96 05/07/2017   INR 1.0 11/03/2014   HGBA1C 5.8 05/07/2017   MICROALBUR 0.9 10/02/2016    No results found.  Assessment & Plan:   Problem List Items Addressed This Visit    Tobacco abuse disorder    Risks of continued tobacco use were discussed. She is not currently interested in tobacco cessation.       Hyperlipidemia    Well controlled on current medications .  :Liver enzymes are normal.  No changes today  Lab Results  Component Value Date   CHOL 131 05/07/2017   HDL 54.30 05/07/2017   LDLCALC 61  05/07/2017   LDLDIRECT 69.0 05/07/2017   TRIG 80.0 05/07/2017   CHOLHDL 2 05/07/2017   Lab Results  Component Value Date   ALT 10 05/07/2017   AST 15 05/07/2017   ALKPHOS 53 05/07/2017   BILITOT 0.6 05/07/2017             Essential hypertension    Well controlled on current regimen. Renal function stable, no changes today.  Lab Results  Component Value Date   CREATININE 0.77 05/07/2017   Lab Results  Component Value Date   NA 133 (L) 05/07/2017   K 4.4 05/07/2017   CL 101 05/07/2017   CO2 27 05/07/2017  Diabetes mellitus without complication ([place])     well-controlled on diet alone.  hemoglobin A1c has been consistently at or  less than 7.0 checked every 6 months  . Patient is up-to-date on eye exams and foot exam is normal today. Patient has no proteinuria and is tolerating statin therapy for CAD risk reduction.  BP has been elevated on the last several visits,  Given her ongoing tobacco abuse, will recommended daily aspirin  81 mg and initiation of low dose losartan .  Lab Results  Component Value Date   HGBA1C 5.8 05/07/2017   Lab Results  Component Value Date   MICROALBUR 0.9 10/02/2016         Atopic dermatitis    Encouraged to increase use of moisturizer.  Hydroxyzine for itching.  Triamcinolone for uncontrolled symptoms          I have discontinued Ms. Wynter's doxycycline, ergocalciferol, ciprofloxacin, and clotrimazole-betamethasone. I have also changed her sertraline. Additionally, I am having her start on triamcinolone cream. Lastly, I am having her maintain her cyanocobalamin, Vitamin D3, Lactulose, fluticasone, clobetasol ointment, meloxicam, SYNTHROID, losartan, ADVAIR DISKUS, simvastatin, traMADol, and hydrOXYzine.  Meds ordered this encounter  Medications  . sertraline (ZOLOFT) 50 MG tablet    Sig: Take 1 tablet (50 mg total) by mouth daily.    Dispense:  90 tablet    Refill:  1  . triamcinolone cream (KENALOG) 0.1 %    Sig:  Apply 1 application topically 2 (two) times daily.    Dispense:  30 g    Refill:  5    Medications Discontinued During This Encounter  Medication Reason  . ciprofloxacin (CIPRO) 250 MG tablet   . doxycycline (VIBRA-TABS) 100 MG tablet   . ergocalciferol (DRISDOL) 50000 units capsule   . sertraline (ZOLOFT) 50 MG tablet Reorder  . clotrimazole-betamethasone (LOTRISONE) cream     Follow-up: Return in about 6 months (around 11/15/2017).   Crecencio Mc, MD

## 2017-05-15 NOTE — Patient Instructions (Addendum)
Your skin may be itching due to being very dry,  Or due to bug bites  Bug bites respond best to triamcinolone cream, (use twice dialy for up to one week )    Dry skin responds best to emollient moisturizers. Best applied to moist skin (after your shower)  And can be reapplied as often as you like  Your diabetes remains under excellent control  And your cholesterol is excellent .   Your thyroid is also very active on your current dose  Please continue your current medications.  return in 6 months for follow up on diabetes and make sure you are seeing your eye doctor at least once a year.

## 2017-05-17 ENCOUNTER — Encounter: Payer: Self-pay | Admitting: Internal Medicine

## 2017-05-17 NOTE — Assessment & Plan Note (Signed)
Encouraged to increase use of moisturizer.  Hydroxyzine for itching.  Triamcinolone for uncontrolled symptoms

## 2017-05-17 NOTE — Assessment & Plan Note (Signed)
Well controlled on current regimen. Renal function stable, no changes today.  Lab Results  Component Value Date   CREATININE 0.77 05/07/2017   Lab Results  Component Value Date   NA 133 (L) 05/07/2017   K 4.4 05/07/2017   CL 101 05/07/2017   CO2 27 05/07/2017

## 2017-05-17 NOTE — Assessment & Plan Note (Signed)
Well controlled on current medications .  :Liver enzymes are normal.  No changes today  Lab Results  Component Value Date   CHOL 131 05/07/2017   HDL 54.30 05/07/2017   LDLCALC 61 05/07/2017   LDLDIRECT 69.0 05/07/2017   TRIG 80.0 05/07/2017   CHOLHDL 2 05/07/2017   Lab Results  Component Value Date   ALT 10 05/07/2017   AST 15 05/07/2017   ALKPHOS 53 05/07/2017   BILITOT 0.6 05/07/2017

## 2017-05-17 NOTE — Assessment & Plan Note (Signed)
Risks of continued tobacco use were discussed. She is not currently interested in tobacco cessation.    

## 2017-05-17 NOTE — Assessment & Plan Note (Signed)
well-controlled on diet alone.  hemoglobin A1c has been consistently at or  less than 7.0 checked every 6 months  . Patient is up-to-date on eye exams and foot exam is normal today. Patient has no proteinuria and is tolerating statin therapy for CAD risk reduction.  BP has been elevated on the last several visits,  Given her ongoing tobacco abuse, will recommended daily aspirin  81 mg and initiation of low dose losartan .  Lab Results  Component Value Date   HGBA1C 5.8 05/07/2017   Lab Results  Component Value Date   MICROALBUR 0.9 10/02/2016

## 2017-06-04 DIAGNOSIS — M5416 Radiculopathy, lumbar region: Secondary | ICD-10-CM | POA: Insufficient documentation

## 2017-06-05 ENCOUNTER — Telehealth: Payer: Self-pay | Admitting: *Deleted

## 2017-06-05 NOTE — Telephone Encounter (Signed)
Pt currently has back pain , and requested to be seen by Dr.Tullo this afternoon. Please advise  Pt contact [phone]

## 2017-06-05 NOTE — Telephone Encounter (Signed)
Please advise 

## 2017-06-05 NOTE — Telephone Encounter (Signed)
Left message to return call to office.

## 2017-06-09 ENCOUNTER — Telehealth: Payer: Self-pay | Admitting: *Deleted

## 2017-06-09 NOTE — Telephone Encounter (Signed)
Notified patient that annual lung cancer screening low dose CT scan is due currently or will be in near future. Discussed importance of lung screening. Patient would like to think about having the scan and contact me if she wants to have the CT scan.

## 2017-06-10 ENCOUNTER — Ambulatory Visit (INDEPENDENT_AMBULATORY_CARE_PROVIDER_SITE_OTHER): Payer: PPO | Admitting: Family

## 2017-06-10 ENCOUNTER — Encounter: Payer: Self-pay | Admitting: Family

## 2017-06-10 VITALS — BP 164/74 | HR 99 | Temp 98.6°F | Ht 64.0 in | Wt 148.6 lb

## 2017-06-10 DIAGNOSIS — R21 Rash and other nonspecific skin eruption: Secondary | ICD-10-CM

## 2017-06-10 DIAGNOSIS — I10 Essential (primary) hypertension: Secondary | ICD-10-CM

## 2017-06-10 DIAGNOSIS — M545 Low back pain, unspecified: Secondary | ICD-10-CM | POA: Insufficient documentation

## 2017-06-10 DIAGNOSIS — Z23 Encounter for immunization: Secondary | ICD-10-CM | POA: Diagnosis not present

## 2017-06-10 DIAGNOSIS — R300 Dysuria: Secondary | ICD-10-CM | POA: Diagnosis not present

## 2017-06-10 LAB — URINALYSIS, ROUTINE W REFLEX MICROSCOPIC
Bilirubin Urine: NEGATIVE
Hgb urine dipstick: NEGATIVE
Ketones, ur: NEGATIVE
Nitrite: NEGATIVE
RBC / HPF: NONE SEEN (ref 0–?)
Total Protein, Urine: NEGATIVE
Urine Glucose: NEGATIVE
Urobilinogen, UA: 0.2 (ref 0.0–1.0)
pH: 6 (ref 5.0–8.0)

## 2017-06-10 MED ORDER — PREDNISONE 10 MG PO TABS: ORAL_TABLET | ORAL | 0 refills | Status: DC

## 2017-06-10 MED ORDER — LIDOCAINE 5 % EX PTCH: 1.0000 | MEDICATED_PATCH | CUTANEOUS | 0 refills | Status: DC

## 2017-06-10 MED ORDER — CLOTRIMAZOLE 1 % EX CREA: 1.0000 "application " | TOPICAL_CREAM | Freq: Two times a day (BID) | CUTANEOUS | 1 refills | Status: DC

## 2017-06-10 NOTE — Assessment & Plan Note (Signed)
Elevated today. No chest pain, shortness of breath. Patient jointly agreed likely pain from low back has increased. I discussed with her the potential for increasing losartan 50 mg. She will monitor blood pressure for next couple days and call with readings and will make that decision. We had discussed starting prednisone taper however my concern for her blood pressure, patient and I had jointly agreed to treat topically with lidocaine patches, heat and physical therapy.

## 2017-06-10 NOTE — Assessment & Plan Note (Addendum)
Patient afebrile, well appearing. Benign abdominal exam. Unsure if nausea related to potential UTI. Advised close vigilance. Will await urine studies.

## 2017-06-10 NOTE — Assessment & Plan Note (Signed)
Symptoms consistent with candida. Also considering eczema. Will trial topical antifungal. Return precautions given.

## 2017-06-10 NOTE — Progress Notes (Signed)
Pre visit review using our clinic review tool, if applicable. No additional management support is needed unless otherwise documented below in the visit note. 

## 2017-06-10 NOTE — Assessment & Plan Note (Signed)
Acute on chronic. Suspect degenerative disc disease based on MRI done 2016. Patient has upcoming appointment with orthopedics with steroid injection. We decided to treat conservatively today with topical lidocaine and I have place referral to physical therapy. Return precautions given.

## 2017-06-10 NOTE — Patient Instructions (Addendum)
Monitor blood pressure with goal being < 130/80. Please keep log and if persistently high , you will need to increase the losartan  Lidocaine patches  Referral to PT  Trial of anti fungal for rash under breasts- if not better, please return for follow up appoointment.   Nausea is nonspecific- with urinary symptoms, may be related to urine. We will test today. If nausea changes or worsens in any way, please make a follow up appointment

## 2017-06-10 NOTE — Progress Notes (Signed)
Subjective:    Patient ID: Jane Davidson, female    DOB: 1941-10-04, 76 y.o.   MRN: [id number]  CC: TIAHNA CURE is a 76 y.o. female who presents today for an acute visit.    [name], improves throughout the day, this has been intermittent for 2 weeks, unchanged.   No belching, episgastric burning.   Endorses right low back pain, urinary frequency x 2 weeks. No hematuria, fever, abdominal pain, flank pain.   Complains of low back right sided , for years, worsening of late. 'comes and goes.' Describes 'sore', stiffness. Twisting, sitting for  Long periods makes pain worse.  No numbness, tingling in legs. No falls. No back injury. No formal exercise.   H/o basal cell cancer; no other cancer history.   Appt for steriod injection 06/2017  Has been taking tramadol BID and mobic with some relief.   Rash under right breast, x 2 days, red, itching. Hasn't tried any medication. No purulent discharge, fever.  HTN- compliant with losartan - takes in afternoon. Denies exertional chest pain or pressure, numbness or tingling radiating to left arm or jaw, palpitations, dizziness, frequent headaches, changes in vision, or shortness of breath.     chasnis  2016- Symptoms consistent with neurogenic claudication. Advised to continue Mobic 50 mg daily and tramadol 50 mg 3 times a day when necessary. Worsening of symptoms -suggest prednisone taper or epidural.  MRI lumbar 2016HISTORY:  Past Medical History:  Diagnosis Date  . Abnormal Pap smear of cervix 2001   Biopsy normal  . Bursitis NEC   . COPD (chronic obstructive pulmonary disease) ([place])   . History of peripheral vascular disease 2001   s/p bilateral aorto-iliac-femoral bypass /[facility]  . Hyperlipidemia   . hypothyroid   . Osteopenia 2009    T scores - 1.5 DEXA 2009  . Screening for breast cancer May 2012   [facility] mammogram  . Tobacco abuse disorder   . Vitamin D deficiency April 2011   replaced  withDrsido for level of 11.3 ng/ml  . Vitamin D deficiency April 2011   replaced withDrsido for level of 11.3 ng/ml   Past Surgical History:  Procedure Laterality Date  . BREAST BIOPSY Left    benign  . CATARACT EXTRACTION  Sept 2011  . CHOLECYSTECTOMY    . HERNIA REPAIR  Jun 2011   [name]   . TUBAL LIGATION     Family History  Problem Relation Age of Onset  . Hypertension Mother   . Arthritis Mother   . Diabetes Father     Allergies: Pollen extract Current Outpatient Prescriptions on File Prior to Visit  Medication Sig Dispense Refill  . ADVAIR DISKUS 100-50 MCG/DOSE AEPB USE 1 INHALATION BY MOUTH TWICE DAILY 14 each 3  . Cholecalciferol (VITAMIN D3) 1000 UNITS CAPS Take 1 capsule by mouth daily.    . clobetasol ointment (TEMOVATE) 6.19 % Apply 1 application topically 2 (two) times daily. 30 g 5  . cyanocobalamin 100 MCG tablet Take 100 mcg by mouth daily.      . fluticasone (FLONASE) 50 MCG/ACT nasal spray Place 2 sprays into both nostrils daily. 16 g 4  . hydrOXYzine (ATARAX/VISTARIL) 25 MG tablet Take 1 tablet (25 mg total) by mouth at bedtime as needed (insomnia). 30-60 minutes prior to bedtime for itching. 60 tablet 0  . Lactulose 20 GM/30ML SOLN 30 ml every 4 hours until constipation is relieved 236 mL 3  . losartan (  COZAAR) 50 MG tablet TAKE 1 TABLET(50 MG) BY MOUTH DAILY 90 tablet 3  . meloxicam (MOBIC) 15 MG tablet Take 1 tablet (15 mg total) by mouth daily. 30 tablet 4  . sertraline (ZOLOFT) 50 MG tablet Take 1 tablet (50 mg total) by mouth daily. 90 tablet 1  . simvastatin (ZOCOR) 20 MG tablet TAKE 1 TABLET BY MOUTH DAILY 90 tablet 0  . SYNTHROID 100 MCG tablet TAKE 1 TABLET BY MOUTH EVERY DAY 90 tablet 3  . traMADol (ULTRAM) 50 MG tablet Take 1 tablet (50 mg total) by mouth every 6 (six) hours as needed. 120 tablet 0  . triamcinolone cream (KENALOG) 0.1 % Apply 1 application topically 2 (two) times daily. 30 g 5   No current facility-administered medications  on file prior to visit.     Social History  Substance Use Topics  . Smoking status: Current Every Day Smoker    Packs/day: 0.75    Years: 40.00    Types: Cigarettes  . Smokeless tobacco: Never Used  . Alcohol use No    Review of Systems  Constitutional: Negative for chills and fever.  Respiratory: Negative for cough and shortness of breath.   Cardiovascular: Negative for chest pain and palpitations.  Gastrointestinal: Positive for nausea. Negative for abdominal pain and vomiting.  Genitourinary: Positive for frequency. Negative for dysuria and flank pain.  Musculoskeletal: Positive for back pain.  Skin: Positive for rash.      Objective:    BP (!) 164/74   Pulse 99   Temp 98.6 F (37 C) (Oral)   Ht 5\' 4"  (1.626 m)   Wt 148 lb 9.6 oz (67.4 kg)   SpO2 97%   BMI 25.51 kg/m  BP Readings from Last 3 Encounters:  06/10/17 (!) 164/74  05/15/17 124/70  04/22/17 (!) 146/60    Physical Exam  Constitutional: She appears well-developed and well-nourished.  Eyes: Conjunctivae are normal.  Cardiovascular: Normal rate, regular rhythm, normal heart sounds and normal pulses.   Pulmonary/Chest: Effort normal and breath sounds normal. She has no wheezes. She has no rhonchi. She has no rales.  Abdominal: Soft. Normal appearance and bowel sounds are normal. She exhibits no distension, no fluid wave, no ascites and no mass. There is no tenderness. There is no rigidity, no rebound, no guarding and no CVA tenderness.  Musculoskeletal:       Lumbar back: She exhibits normal range of motion, no tenderness, no bony tenderness, no swelling, no edema, no pain and no spasm.  Full range of motion with flexion, tension, lateral side bends. No bony tenderness. No pain, numbness, tingling elicited with single leg raise bilaterally.   Neurological: She is alert. She has normal strength. No sensory deficit.  Reflex Scores:      Patellar reflexes are 2+ on the right side and 2+ on the left  side. Sensation and strength intact bilateral lower extremities.  Skin: Skin is warm and dry. Rash noted.     Pruritic, excoriated, erythematous patch approx 3cm noted right center of chest. The purulent discharge, increased warmth, streaking, swelling.  Psychiatric: She has a normal mood and affect. Her speech is normal and behavior is normal. Thought content normal.  Vitals reviewed.      Assessment & Plan:   Problem List Items Addressed This Visit      Cardiovascular and Mediastinum   Essential hypertension    Elevated today. No chest pain, shortness of breath. Patient jointly agreed likely pain from low back has  increased. I discussed with her the potential for increasing losartan 50 mg. She will monitor blood pressure for next couple days and call with readings and will make that decision. We had discussed starting prednisone taper however my concern for her blood pressure, patient and I had jointly agreed to treat topically with lidocaine patches, heat and physical therapy.        Musculoskeletal and Integument   Rash    Symptoms consistent with candida. Also considering eczema. Will trial topical antifungal. Return precautions given.       Relevant Medications   clotrimazole (LOTRIMIN) 1 % cream     Other   Encounter for immunization   Relevant Orders   Flu vaccine HIGH DOSE PF (Completed)   Acute bilateral low back pain - Primary    Acute on chronic. Suspect degenerative disc disease based on MRI done 2016. Patient has upcoming appointment with orthopedics with steroid injection. We decided to treat conservatively today with topical lidocaine and I have place referral to physical therapy. Return precautions given.      Relevant Medications   lidocaine (LIDODERM) 5 %   predniSONE (DELTASONE) 10 MG tablet   Other Relevant Orders   Ambulatory referral to Physical Therapy   Dysuria     Patient afebrile, well appearing. Benign abdominal exam. Unsure if nausea related to  potential UTI. Advised close vigilance. Will await urine studies.       Relevant Orders   CULTURE, URINE COMPREHENSIVE   Urinalysis, Routine w reflex microscopic        I am having Ms. Laureano start on lidocaine, predniSONE, and clotrimazole. I am also having her maintain her cyanocobalamin, Vitamin D3, Lactulose, fluticasone, clobetasol ointment, meloxicam, SYNTHROID, losartan, ADVAIR DISKUS, simvastatin, traMADol, hydrOXYzine, sertraline, and triamcinolone cream.   Meds ordered this encounter  Medications  . lidocaine (LIDODERM) 5 %    Sig: Place 1 patch onto the skin daily. Remove & Discard patch within 12 hours.    Dispense:  30 patch    Refill:  0    Order Specific Question:   Supervising Provider    Answer:   [name] L [[id number]]  . predniSONE (DELTASONE) 10 MG tablet    Sig: Take 4 tablets ( total 40 mg) by mouth for 2 days; take 3 tablets ( total 30 mg) by mouth for 2 days; take 2 tablets ( total 20 mg) by mouth for 1 day; take 1 tablet ( total 10 mg) by mouth for 1 day.    Dispense:  17 tablet    Refill:  0    Order Specific Question:   Supervising Provider    Answer:   [name] L [[id number]]  . clotrimazole (LOTRIMIN) 1 % cream    Sig: Apply 1 application topically 2 (two) times daily.    Dispense:  30 g    Refill:  1    Order Specific Question:   Supervising Provider    Answer:   Crecencio Mc [[id number]]    Return precautions given.   Risks, benefits, and alternatives of the medications and treatment plan prescribed today were discussed, and patient expressed understanding.   Education regarding symptom management and diagnosis given to patient on AVS.  Continue to follow with Crecencio Mc, MD for routine health maintenance.   Jane Davidson and I agreed with plan.   Mable Paris, FNP

## 2017-06-11 ENCOUNTER — Other Ambulatory Visit: Payer: Self-pay | Admitting: Internal Medicine

## 2017-06-13 ENCOUNTER — Other Ambulatory Visit: Payer: Self-pay | Admitting: Internal Medicine

## 2017-06-13 LAB — CULTURE, URINE COMPREHENSIVE

## 2017-06-16 ENCOUNTER — Telehealth: Payer: Self-pay | Admitting: Family

## 2017-06-16 DIAGNOSIS — R300 Dysuria: Secondary | ICD-10-CM

## 2017-06-16 NOTE — Telephone Encounter (Signed)
I called her over weekend left her a vm- ensure she got.    MRSA was seen in her urine. I called in antibiotic to her pharmacy, [place].   Please let us know if she is having any symptoms since starting antibiotic.

## 2017-06-16 NOTE — Telephone Encounter (Signed)
Patient has been notified, also patient did receive VM and got the medication.

## 2017-06-18 NOTE — Telephone Encounter (Signed)
Failed to add onto my note that I would like patient to come back and we will recheck urine to ensure infection has gone.   Would she mind coming in tomorrow/early next week?  Also, please ensure sxs are better  Pended future ordered

## 2017-06-18 NOTE — Addendum Note (Signed)
Addended by: Burnard Hawthorne on: 06/18/2017 01:23 PM   Modules accepted: Orders

## 2017-06-24 ENCOUNTER — Telehealth: Payer: Self-pay | Admitting: *Deleted

## 2017-06-24 DIAGNOSIS — Z87891 Personal history of nicotine dependence: Secondary | ICD-10-CM

## 2017-06-24 DIAGNOSIS — Z122 Encounter for screening for malignant neoplasm of respiratory organs: Secondary | ICD-10-CM

## 2017-06-24 NOTE — Telephone Encounter (Signed)
Notified patient that annual lung cancer screening low dose CT scan is due currently or will be in near future. Confirmed that patient is within the age range of 55-77, and asymptomatic, (no signs or symptoms of lung cancer). Patient denies illness that would prevent curative treatment for lung cancer if found. Verified smoking history, (current, 41.5 pack year). The shared decision making visit was done 06/17/16. Patient is agreeable for CT scan being scheduled.

## 2017-07-07 ENCOUNTER — Ambulatory Visit: Payer: PPO | Attending: Family

## 2017-07-07 VITALS — BP 148/69 | HR 97

## 2017-07-07 DIAGNOSIS — G8929 Other chronic pain: Secondary | ICD-10-CM | POA: Diagnosis not present

## 2017-07-07 DIAGNOSIS — M545 Low back pain: Secondary | ICD-10-CM | POA: Insufficient documentation

## 2017-07-07 DIAGNOSIS — M6281 Muscle weakness (generalized): Secondary | ICD-10-CM | POA: Diagnosis not present

## 2017-07-07 NOTE — Patient Instructions (Signed)
   Sitting bilateral shoulder extension isometrics   Sitting on a chair    Press your hands on your thighs to feel your abdominal muscles activate    Hold for 5 seconds.     Repeat 10 times.     Perform 3 sets daily .

## 2017-07-07 NOTE — Therapy (Signed)
[facility] [address] [place], [address] Phone: [phone]   Fax:  [phone]  Physical Therapy Evaluation  Patient Details  Name: Jane Davidson MRN: [id number] Date of Birth: September 08, 1941 Referring Provider: Yvetta Coder. Vidal Schwalbe, FNP  Encounter Date: 07/07/2017      PT End of Session - 07/07/17 0944    Visit Number 1   Number of Visits 13   Date for PT Re-Evaluation 08/20/17   Authorization Type 1   Authorization Time Period of 10 G-code   PT Start Time 0945   PT Stop Time 1047   PT Time Calculation (min) 62 min   Activity Tolerance Patient tolerated treatment well   Behavior During Therapy WFL for tasks assessed/performed      Past Medical History:  Diagnosis Date  . Abnormal Pap smear of cervix 2001   Biopsy normal  . Bursitis NEC   . COPD (chronic obstructive pulmonary disease) ([place])   . History of peripheral vascular disease 2001   s/p bilateral aorto-iliac-femoral bypass /[facility]  . Hyperlipidemia   . hypothyroid   . Osteopenia 2009    T scores - 1.5 DEXA 2009  . Screening for breast cancer May 2012   [facility] mammogram  . Tobacco abuse disorder   . Vitamin D deficiency April 2011   replaced withDrsido for level of 11.3 ng/ml  . Vitamin D deficiency April 2011   replaced withDrsido for level of 11.3 ng/ml    Past Surgical History:  Procedure Laterality Date  . BREAST BIOPSY Left    benign  . CATARACT EXTRACTION  Sept 2011  . CHOLECYSTECTOMY    . HERNIA REPAIR  Jun 2011   [name]   . TUBAL LIGATION      Vitals:   07/07/17 0950  BP: (!) 148/69  Pulse: 97  SpO2: 100%         Subjective Assessment - 07/07/17 0958    Subjective low back pain: 0/10 currently (pt sitting, also took tramadol yesterday); 6/10 at worst (without pain medication)   Pertinent History Low back pain. Does not bother her continuously but when it does, she feels it in her low back. Had a fall 2-3  years ago picking up her grandaughter, tripped and fell. Had an MRI around 2016/2017 which revealed deteriorating discs.  Pt states feeling more urinary urgency when going to bed after drinking sweet tea, not as much as when she drinks water. Able to control her bladder to go to the bathrom. MD knows about it too. Denies bowel problems.  Denies LE paresthesia.   Has not had PT for her back before.  Sometimes feels pain in her R thigh (does not think it goes past her knee) which makes R LE weight bearing difficult.  R thigh pain along the L5 dermatome pathway when it occurs.     Patient Stated Goals Pt expresses desire to get better.    Currently in Pain? No/denies   Pain Score 0-No pain   Pain Location Back   Pain Orientation Posterior;Lower;Right;Left   Pain Descriptors / Indicators Aching;Discomfort   Pain Type Chronic pain;Acute pain   Pain Onset More than a month ago   Aggravating Factors  Standing up quickly from sitting (after sitting for a while like watching TV), walking around the mall, L S/L (sleeps on her L side), sometimes sleeping on her R side as well.     Pain Relieving Factors pain medication, walking short  distances            [facility] PT Assessment - 07/07/17 1012      Assessment   Medical Diagnosis Acute bilateral low back pain, with sciatica presence unspecified.    Referring Provider Yvetta Coder. Arnett, FNP   Onset Date/Surgical Date 06/10/17  date PT referral signed; chronic condition   Prior Therapy None     Precautions   Precaution Comments possible fall risk     Restrictions   Other Position/Activity Restrictions no known restrictions     Balance Screen   Has the patient fallen in the past 6 months Yes   Has the patient had a decrease in activity level because of a fear of falling?  No   Is the patient reluctant to leave their home because of a fear of falling?  No     Home Environment   Additional Comments Pt lives in a 1 story home alone. 2 steps to  enter, no rail      Prior Function   Vocation Retired   [employer] PLOF: better able to ambulate longer distances, stand up after prolonged sitting, tolerate positions such as lying on her side with less back pain.      Observation/Other Assessments   Observations No LE tension with bilateral hip flexion position (long sit position)     Posture/Postural Control   Posture Comments bilaterally protracted shoulders and neck, R lateral shift, L shoulder lower, bilateral foot pronation L > R, R greater trochanter slightly higher, decreased bilateral hip extension, forward flexed posture.     AROM   Overall AROM Comments no back pain with seated trunk flexion to put shoes on   Lumbar Flexion WFL with reproduction of low back pain with slight bilateral LE weakness sensation felt.    Lumbar Extension WFL without pain   Lumbar - Right Side [facility] with slight R low back pain   Lumbar - Left Side Bend limited with slight R low back pain   Lumbar - Right Rotation limited with R low back pain   Lumbar - Left Rotation WFL with R lateral lean, slight R low back pain (decreased with assist to decrease R lateral lean)     Strength   Overall Strength Comments (-) long sit test   Right Hip Flexion 4-/5   Right Hip Extension 4-/5  seated manually resisted leg press   Right Hip ABduction 4-/5   Left Hip Flexion 4/5   Left Hip Extension 4/5  seated manually resisted leg press   Left Hip ABduction 4-/5   Right Knee Flexion 4+/5   Right Knee Extension 5/5   Left Knee Flexion 4+/5   Left Knee Extension 5/5     Palpation   Palpation comment R lumbar paraspinal muscle tension. TTP R low back around the L5/S1 area     Ambulation/Gait   Gait Comments increased L trunk rotation during L LE swing phase            Objective measurements completed on examination: See above findings.   Ther-ex  Standing R lateral shift correction 5x2 with 5 second holds. No symptoms.  seated  bilateral shoulder extension isometrics 10x5 seconds   Reviewed and given as part of her HEP. Pt demonstrated and verbalized understanding. Handout provided.    Improved exercise technique, movement at target joints, use of target muscles after mod verbal, visual, tactile cues.    No complain of increased pain with exercises.  PT Education - 07/07/17 1701    Education provided Yes   Education Details ther-ex, HEP, plan of care   Person(s) Educated Patient   Methods Explanation;Demonstration;Tactile cues;Verbal cues;Handout   Comprehension Returned demonstration;Verbalized understanding             PT Long Term Goals - 07/07/17 1158      PT LONG TERM GOAL #1   Title Patient will have a decrease in back pain to 3/10 or less at worst to promote ability to ambulate longer distances, stand up after prolonged sitting, and tolerate positions such as side lying when sleeping.    Baseline 6/10 at worst (07/07/2017)   Time 6   Period Weeks   Status New   Target Date 08/20/17     PT LONG TERM GOAL #2   Title Patient will improve bilateral hip strength by at least 1/2 MMT grade to promote ability to ambulate longer distances with less pain.    Time 6   Period Weeks   Status New   Target Date 08/20/17     PT LONG TERM GOAL #3   Title Pt will report being able to ambulate around the mall or store will less back pain to promote mobility and ablity to obtain supplies.    Baseline Back pain increases when walking long distances such as when walking in a mall (07/07/2017)   Time 6   Period Weeks   Status New   Target Date 08/20/17                Plan - 07/07/17 1151    Clinical Impression Statement Patient is a 76 year old female who came to physical therapy secondary to back pain with R LE symptoms. She also presents with reproduction of symptoms with standing lumbar flexion R and L side bending and R and L rotation; bilateral hip weakness, TTP to  R low back, increased R lumbar paraspinal muscle tension, and difficulty performing functional tasks such as walking longer distances, standing up after prolonged sitting, as well as tolerating positions such as side lying when sleeping. Pt will benefit from skilled physical therapy services to address the aforementioned deficits.    History and Personal Factors relevant to plan of care: Chronicity of condition    Clinical Presentation Stable   Clinical Presentation due to: Pt states symptoms are better   Clinical Decision Making Low   Rehab Potential Good   Clinical Impairments Affecting Rehab Potential Chronicity of condition, age   PT Frequency 2x / week   PT Duration 6 weeks   PT Treatment/Interventions Therapeutic exercise;Neuromuscular re-education;Therapeutic activities;Aquatic Therapy;Iontophoresis 4mg /ml Dexamethasone;Electrical Stimulation;Ultrasound;Patient/family education;Manual techniques;Dry needling   PT Next Visit Plan trunk, hip, core strengthening, control, posture, modalities PRN   Consulted and Agree with Plan of Care Patient      Patient will benefit from skilled therapeutic intervention in order to improve the following deficits and impairments:  Pain, Postural dysfunction, Improper body mechanics, Difficulty walking, Decreased strength  Visit Diagnosis: Chronic bilateral low back pain, with sciatica presence unspecified - Plan: PT plan of care cert/re-cert  Muscle weakness (generalized) - Plan: PT plan of care cert/re-cert      G-Codes - 20/25/42 1204    Functional Assessment Tool Used (Outpatient Only) Clinical presentation, patient interview   Functional Limitation Mobility: Walking and moving around   Mobility: Walking and Moving Around Current Status ([id number]) At least 40 percent but less than 60 percent impaired, limited or restricted   Mobility: Walking and  Moving Around Goal Status [phone]) At least 20 percent but less than 40 percent impaired, limited or  restricted       Problem List Patient Active Problem List   Diagnosis Date Noted  . Acute bilateral low back pain 06/10/2017  . Rash 06/10/2017  . Dysuria 06/10/2017  . Atopic dermatitis 04/22/2017  . Sinusitis, acute 11/04/2016  . Carotid stenosis 08/29/2016  . Personal history of tobacco use, presenting hazards to health 06/18/2016  . Essential hypertension 02/29/2016  . Diabetes mellitus without complication ([place]) 37/94/3276  . Onychomycosis of left great toe 04/01/2015  . Constipation 06/08/2014  . Anemia 09/27/2013  . Encounter for immunization 03/27/2013  . Screening for colon cancer 10/16/2011  . Tobacco abuse counseling 08/09/2011  . History of peripheral vascular disease   . Bursitis, ischial   . Osteopenia   . Tobacco abuse disorder   . Vitamin D deficiency   . Arrhythmia 07/05/2011  . Hypothyroidism 07/05/2011  . 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 COPD (chronic obstructive pulmonary disease) ([place])   . Hyperlipidemia   . Screening for breast cancer 02/11/2011    Joneen Boers PT, DPT   07/07/2017, 5:13 PM  [facility] [address], [place], [address] Phone: [phone]   Fax:  [phone]  Name: LARRISSA STIVERS MRN: [id number] Date of Birth: 06-Mar-1941

## 2017-07-08 ENCOUNTER — Ambulatory Visit
Admission: RE | Admit: 2017-07-08 | Discharge: 2017-07-08 | Disposition: A | Discharge status: Home / Self Care | Payer: PPO | Source: Ambulatory Visit | Attending: Oncology | Admitting: Oncology

## 2017-07-08 DIAGNOSIS — J439 Emphysema, unspecified: Secondary | ICD-10-CM | POA: Insufficient documentation

## 2017-07-08 DIAGNOSIS — I700 Atherosclerosis of aorta: Secondary | ICD-10-CM | POA: Insufficient documentation

## 2017-07-08 DIAGNOSIS — Z122 Encounter for screening for malignant neoplasm of respiratory organs: Secondary | ICD-10-CM | POA: Insufficient documentation

## 2017-07-08 DIAGNOSIS — Z87891 Personal history of nicotine dependence: Secondary | ICD-10-CM

## 2017-07-13 ENCOUNTER — Ambulatory Visit: Payer: PPO | Attending: Family

## 2017-07-13 ENCOUNTER — Encounter: Payer: Self-pay | Admitting: *Deleted

## 2017-07-13 DIAGNOSIS — G8929 Other chronic pain: Secondary | ICD-10-CM | POA: Insufficient documentation

## 2017-07-13 DIAGNOSIS — M545 Low back pain: Secondary | ICD-10-CM | POA: Insufficient documentation

## 2017-07-13 DIAGNOSIS — M6281 Muscle weakness (generalized): Secondary | ICD-10-CM | POA: Insufficient documentation

## 2017-07-13 NOTE — Therapy (Signed)
[facility] [address] [place], [address] Phone: [phone]   Fax:  [phone]  Physical Therapy Treatment  Patient Details  Name: Jane Davidson MRN: [id number] Date of Birth: 1941-07-16 Referring Provider: Yvetta Coder. Vidal Schwalbe, FNP  Encounter Date: 07/13/2017      PT End of Session - 07/13/17 0930    Visit Number 2   Number of Visits 13   Date for PT Re-Evaluation 08/20/17   Authorization Type 2   Authorization Time Period of 10 G-code   PT Start Time 0930   PT Stop Time 1025   PT Time Calculation (min) 55 min   Activity Tolerance Patient tolerated treatment well   Behavior During Therapy WFL for tasks assessed/performed      Past Medical History:  Diagnosis Date  . Abnormal Pap smear of cervix 2001   Biopsy normal  . Bursitis NEC   . COPD (chronic obstructive pulmonary disease) ([place])   . History of peripheral vascular disease 2001   s/p bilateral aorto-iliac-femoral bypass /[facility]  . Hyperlipidemia   . hypothyroid   . Osteopenia 2009    T scores - 1.5 DEXA 2009  . Screening for breast cancer May 2012   [facility] mammogram  . Tobacco abuse disorder   . Vitamin D deficiency April 2011   replaced withDrsido for level of 11.3 ng/ml  . Vitamin D deficiency April 2011   replaced withDrsido for level of 11.3 ng/ml    Past Surgical History:  Procedure Laterality Date  . BREAST BIOPSY Left    benign  . CATARACT EXTRACTION  Sept 2011  . CHOLECYSTECTOMY    . HERNIA REPAIR  Jun 2011   [name]   . TUBAL LIGATION      There were no vitals filed for this visit.      Subjective Assessment - 07/13/17 0932    Subjective Back feels pretty good. No pain yet today. Took tramadol the night before last. Pt states her body usually shakes/tremmors when she is nervious. Ate breakfast this morning.    Pertinent History Low back pain. Does not bother her continuously but when it does, she feels it  in her low back. Had a fall 2-3 years ago picking up her grandaughter, tripped and fell. Had an MRI around 2016/2017 which revealed deteriorating discs.  Pt states feeling more urinary urgency when going to bed after drinking sweet tea, not as much as when she drinks water. Able to control her bladder to go to the bathrom. MD knows about it too. Denies bowel problems.  Denies LE paresthesia.   Has not had PT for her back before.  Sometimes feels pain in her R thigh (does not think it goes past her knee) which makes R LE weight bearing difficult.  R thigh pain along the L5 dermatome pathway when it occurs.     Patient Stated Goals Pt expresses desire to get better.    Currently in Pain? No/denies   Pain Score 0-No pain   Pain Onset More than a month ago                                 PT Education - 07/13/17 0957    Education provided Yes   Education Details ther-ex   [employer]) Educated Patient   Methods Explanation;Demonstration;Verbal cues;Tactile cues   Comprehension Verbalized understanding;Returned demonstration  Objectives Pt states her body usually shakes/tremmors when she is nervious. Ate breakfast this morning.     Ther-ex  Standing R lateral shift correction 10x3. No symptoms.  Standing leg press resisting double green band 10x3 each LE with bilateral UE assist   Standing bilateral shoulder extension resisting yellow band 10x2 to promote scapular strengthening and thoracic extension  Side stepping 32 ft to the R and 32 ft to the L to promote glute med strengthening.   Seated hip adduction ball and glute max squeeze 10x3 with 5 seconds  Sitting   Gentle manual trunk perturbation 1 min x 3 to promote trunk muscle use.   Forward wedding march 32 ft x 2 to promote glute med muscle use  Forward step up onto Air Ex pad with R LE and L UE assist 10x3  Standing bilateral scapular retraction 10x3  Standing bilateral shoulder rows resisting  yellow band 10x3 to promote scapular strengthening. Difficulty with scapular coordination with arm and forearm movement.    Improved exercise technique, movement at target joints, use of target muscles after mod verbal, visual, tactile cues.    Worked on posture, trunk, hip and scapular strengthening. Good glute med muscle use felt with side stepping and wedding march exercise. Pt tolerated session well without aggravation of symptoms.         PT Long Term Goals - 07/07/17 1158      PT LONG TERM GOAL #1   Title Patient will have a decrease in back pain to 3/10 or less at worst to promote ability to ambulate longer distances, stand up after prolonged sitting, and tolerate positions such as side lying when sleeping.    Baseline 6/10 at worst (07/07/2017)   Time 6   Period Weeks   Status New   Target Date 08/20/17     PT LONG TERM GOAL #2   Title Patient will improve bilateral hip strength by at least 1/2 MMT grade to promote ability to ambulate longer distances with less pain.    Time 6   Period Weeks   Status New   Target Date 08/20/17     PT LONG TERM GOAL #3   Title Pt will report being able to ambulate around the mall or store will less back pain to promote mobility and ablity to obtain supplies.    Baseline Back pain increases when walking long distances such as when walking in a mall (07/07/2017)   Time 6   Period Weeks   Status New   Target Date 08/20/17               Plan - 07/13/17 0927    Clinical Impression Statement Worked on posture, trunk, hip and scapular strengthening. Good glute med muscle use felt with side stepping and wedding march exercise. Pt tolerated session well without aggravation of symptoms.    History and Personal Factors relevant to plan of care: Chronicity of condition   Clinical Presentation Stable   Clinical Presentation due to: Pt tolerated session well without aggravation of symptoms.    Clinical Decision Making Low   Rehab Potential  Good   Clinical Impairments Affecting Rehab Potential Chronicity of condition, age   PT Frequency 2x / week   PT Duration 6 weeks   PT Treatment/Interventions Therapeutic exercise;Neuromuscular re-education;Therapeutic activities;Aquatic Therapy;Iontophoresis 4mg /ml Dexamethasone;Electrical Stimulation;Ultrasound;Patient/family education;Manual techniques;Dry needling   PT Next Visit Plan trunk, hip, core strengthening, control, posture, modalities PRN   Consulted and Agree with Plan of Care Patient  Patient will benefit from skilled therapeutic intervention in order to improve the following deficits and impairments:  Pain, Postural dysfunction, Improper body mechanics, Difficulty walking, Decreased strength  Visit Diagnosis: Chronic bilateral low back pain, with sciatica presence unspecified  Muscle weakness (generalized)     Problem List Patient Active Problem List   Diagnosis Date Noted  . Acute bilateral low back pain 06/10/2017  . Rash 06/10/2017  . Dysuria 06/10/2017  . Atopic dermatitis 04/22/2017  . Sinusitis, acute 11/04/2016  . Carotid stenosis 08/29/2016  . Personal history of tobacco use, presenting hazards to health 06/18/2016  . Essential hypertension 02/29/2016  . Diabetes mellitus without complication ([place]) 21/19/4174  . Onychomycosis of left great toe 04/01/2015  . Constipation 06/08/2014  . Anemia 09/27/2013  . Encounter for immunization 03/27/2013  . Screening for colon cancer 10/16/2011  . Tobacco abuse counseling 08/09/2011  . History of peripheral vascular disease   . Bursitis, ischial   . Osteopenia   . Tobacco abuse disorder   . Vitamin D deficiency   . Arrhythmia 07/05/2011  . Hypothyroidism 07/05/2011  . 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 COPD (chronic obstructive pulmonary disease) ([place])   . Hyperlipidemia   . Screening for breast cancer 02/11/2011    Joneen Boers PT, DPT   07/13/2017, 3:17 PM  [facility] [address] [place], [address] Phone: [phone]   Fax:  [phone]  Name: Jane Davidson MRN: [id number] Date of Birth: [birth year]

## 2017-07-13 NOTE — Telephone Encounter (Signed)
Orders

## 2017-07-15 ENCOUNTER — Ambulatory Visit: Payer: PPO

## 2017-07-15 DIAGNOSIS — M545 Low back pain: Secondary | ICD-10-CM | POA: Diagnosis not present

## 2017-07-15 DIAGNOSIS — M6281 Muscle weakness (generalized): Secondary | ICD-10-CM

## 2017-07-15 DIAGNOSIS — G8929 Other chronic pain: Secondary | ICD-10-CM

## 2017-07-15 NOTE — Patient Instructions (Signed)
Place a small towel roll behind your low back whenever you are sitting.

## 2017-07-15 NOTE — Therapy (Signed)
[facility] [address], [place], [address] Phone: [phone]   Fax:  [phone]  Physical Therapy Treatment  Patient Details  Name: Jane Davidson MRN: [id number] Date of Birth: 1940/10/14 Referring Provider: Yvetta Coder. Vidal Schwalbe, FNP  Encounter Date: 07/15/2017      PT End of Session - 07/15/17 0936    Visit Number 3   Number of Visits 13   Date for PT Re-Evaluation 08/20/17   Authorization Type 3   Authorization Time Period of 10 G-code   PT Start Time 0936   PT Stop Time 1018   PT Time Calculation (min) 42 min   Activity Tolerance Patient tolerated treatment well   Behavior During Therapy [facility] for tasks assessed/performed      Past Medical History:  Diagnosis Date  . Abnormal Pap smear of cervix 2001   Biopsy normal  . Bursitis NEC   . COPD (chronic obstructive pulmonary disease) ([place])   . History of peripheral vascular disease 2001   s/p bilateral aorto-iliac-femoral bypass /[facility]ke  . Hyperlipidemia   . hypothyroid   . Osteopenia 2009    T scores - 1.5 DEXA 2009  . Screening for breast cancer May 2012   [facility] mammogram  . Tobacco abuse disorder   . Vitamin D deficiency April 2011   replaced withDrsido for level of 11.3 ng/ml  . Vitamin D deficiency April 2011   replaced withDrsido for level of 11.3 ng/ml    Past Surgical History:  Procedure Laterality Date  . BREAST BIOPSY Left    benign  . CATARACT EXTRACTION  Sept 2011  . CHOLECYSTECTOMY    . HERNIA REPAIR  Jun 2011   [name]   . TUBAL LIGATION      There were no vitals filed for this visit.      Subjective Assessment - 07/15/17 0937    Subjective Had to take a pain pill last night. R low back hurt yesterday (7/10). Had an MRI a while ago which revealed disc related things. Just a little R low back pain currently.  Back was ok after Monday's session.    Pertinent History Low back pain. Does not bother her  continuously but when it does, she feels it in her low back. Had a fall 2-3 years ago picking up her grandaughter, tripped and fell. Had an MRI around 2016/2017 which revealed deteriorating discs.  Pt states feeling more urinary urgency when going to bed after drinking sweet tea, not as much as when she drinks water. Able to control her bladder to go to the bathrom. MD knows about it too. Denies bowel problems.  Denies LE paresthesia.   Has not had PT for her back before.  Sometimes feels pain in her R thigh (does not think it goes past her knee) which makes R LE weight bearing difficult.  R thigh pain along the L5 dermatome pathway when it occurs.     Patient Stated Goals Pt expresses desire to get better.    Currently in Pain? Yes   Pain Score 4    Pain Onset More than a month ago                                 PT Education - 07/15/17 0958    Education provided Yes   Education Details ther-ex, HEP   Person(s) Educated Patient   Methods Explanation;Demonstration;Tactile  cues;Verbal cues;Handout   Comprehension Returned demonstration;Verbalized understanding        Objectives   Ther-ex  Standing R lateral shift correction 10x2 with 5 second holds  Sitting with lumbar towel roll x 2 min   Then with bilateral low rows yellow band 10x2 with 5 second holds  Seated bilateral shoulder extension isometrics (hands on thighs) 10x5 seconds for 3 sets  Standing regular rows resisting yellow band 10x3 with 5 second holds  Sitting on dyna disc  Isometric trunk flexion 10x5 seconds for 3 sets  Side stepping 32 ft to the R and 32 ft to the L to promote glute med strengthening.   Forward wedding march 32 ft x 2 to promote glute strengthening.   Improved exercise technique, movement at target joints, use of target muscles after mod verbal, visual, tactile cues.    Decreased R low back pain with gentle extension from lumbar towel roll in sitting. Bilateral lumbar  paraspinal muscle tension R > L palpated. Decreased tension with activation of abdominal muscles.            PT Long Term Goals - 07/07/17 1158      PT LONG TERM GOAL #1   Title Patient will have a decrease in back pain to 3/10 or less at worst to promote ability to ambulate longer distances, stand up after prolonged sitting, and tolerate positions such as side lying when sleeping.    Baseline 6/10 at worst (07/07/2017)   Time 6   Period Weeks   Status New   Target Date 08/20/17     PT LONG TERM GOAL #2   Title Patient will improve bilateral hip strength by at least 1/2 MMT grade to promote ability to ambulate longer distances with less pain.    Time 6   Period Weeks   Status New   Target Date 08/20/17     PT LONG TERM GOAL #3   Title Pt will report being able to ambulate around the mall or store will less back pain to promote mobility and ablity to obtain supplies.    Baseline Back pain increases when walking long distances such as when walking in a mall (07/07/2017)   Time 6   Period Weeks   Status New   Target Date 08/20/17               Plan - 07/15/17 0959    Clinical Impression Statement Decreased R low back pain with gentle extension from lumbar towel roll in sitting. Bilateral lumbar paraspinal muscle tension R > L palpated. Decreased tension with activation of abdominal muscles.   History and Personal Factors relevant to plan of care: Chronicity of condition   Clinical Presentation Stable   Clinical Presentation due to: Pt tolerated session well without aggravation of symptoms.    Clinical Decision Making Low   Rehab Potential Good   Clinical Impairments Affecting Rehab Potential Chronicity of condition, age   PT Frequency 2x / week   PT Duration 6 weeks   PT Treatment/Interventions Therapeutic exercise;Neuromuscular re-education;Therapeutic activities;Aquatic Therapy;Iontophoresis 4mg /ml Dexamethasone;Electrical Stimulation;Ultrasound;Patient/family  education;Manual techniques;Dry needling   PT Next Visit Plan trunk, hip, core strengthening, control, posture, modalities PRN   Consulted and Agree with Plan of Care Patient      Patient will benefit from skilled therapeutic intervention in order to improve the following deficits and impairments:  Pain, Postural dysfunction, Improper body mechanics, Difficulty walking, Decreased strength  Visit Diagnosis: Chronic bilateral low back pain, with sciatica presence unspecified  Muscle weakness (generalized)     Problem List Patient Active Problem List   Diagnosis Date Noted  . Acute bilateral low back pain 06/10/2017  . Rash 06/10/2017  . Dysuria 06/10/2017  . Atopic dermatitis 04/22/2017  . Sinusitis, acute 11/04/2016  . Carotid stenosis 08/29/2016  . Personal history of tobacco use, presenting hazards to health 06/18/2016  . Essential hypertension 02/29/2016  . Diabetes mellitus without complication ([place]) 94/70/9628  . Onychomycosis of left great toe 04/01/2015  . Constipation 06/08/2014  . Anemia 09/27/2013  . Encounter for immunization 03/27/2013  . Screening for colon cancer 10/16/2011  . Tobacco abuse counseling 08/09/2011  . History of peripheral vascular disease   . Bursitis, ischial   . Osteopenia   . Tobacco abuse disorder   . Vitamin D deficiency   . Arrhythmia 07/05/2011  . Hypothyroidism 07/05/2011  . 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 COPD (chronic obstructive pulmonary disease) ([place])   . Hyperlipidemia   . Screening for breast cancer 02/11/2011     Joneen Boers PT, DPT  07/15/2017, 6:40 PM  [place] [facility] [address], [place], [address] Phone: [phone]   Fax:  [phone]  Name: Jane Davidson MRN: [id number] Date of Birth: October 21, 1940

## 2017-07-20 ENCOUNTER — Ambulatory Visit: Payer: PPO

## 2017-07-20 DIAGNOSIS — G8929 Other chronic pain: Secondary | ICD-10-CM

## 2017-07-20 DIAGNOSIS — M6281 Muscle weakness (generalized): Secondary | ICD-10-CM

## 2017-07-20 DIAGNOSIS — M545 Low back pain: Secondary | ICD-10-CM | POA: Diagnosis not present

## 2017-07-20 NOTE — Therapy (Signed)
[place] [facility] [address], [place], [address] Phone: [phone]   Fax:  [phone]  Physical Therapy Treatment  Patient Details  Name: Jane Davidson MRN: [id number] Date of Birth: 1941/02/17 Referring Provider: Yvetta Coder. Vidal Schwalbe, FNP  Encounter Date: 07/20/2017      PT End of Session - 07/20/17 1021    Visit Number 4   Number of Visits 13   Date for PT Re-Evaluation 08/20/17   Authorization Type 4   Authorization Time Period of 10 G-code   PT Start Time 1021   PT Stop Time 1105   PT Time Calculation (min) 44 min   Activity Tolerance Patient tolerated treatment well   Behavior During Therapy [facility] for tasks assessed/performed      Past Medical History:  Diagnosis Date  . Abnormal Pap smear of cervix 2001   Biopsy normal  . Bursitis NEC   . COPD (chronic obstructive pulmonary disease) ([place])   . History of peripheral vascular disease 2001   s/p bilateral aorto-iliac-femoral bypass /[facility]  . Hyperlipidemia   . hypothyroid   . Osteopenia 2009    T scores - 1.5 DEXA 2009  . Screening for breast cancer May 2012   Norma mammogram  . Tobacco abuse disorder   . Vitamin D deficiency April 2011   replaced withDrsido for level of 11.3 ng/ml  . Vitamin D deficiency April 2011   replaced withDrsido for level of 11.3 ng/ml    Past Surgical History:  Procedure Laterality Date  . BREAST BIOPSY Left    benign  . CATARACT EXTRACTION  Sept 2011  . CHOLECYSTECTOMY    . HERNIA REPAIR  Jun 2011   [name]   . TUBAL LIGATION      There were no vitals filed for this visit.      Subjective Assessment - 07/20/17 1023    Subjective Took a pain pill this morning. No pain currently. Back started aching after shopping after session last Wednesday. No LE pain or discomfort.  Back pain is usually about 6/10 in the morning.    Pertinent History Low back pain. Does not bother her continuously but when it  does, she feels it in her low back. Had a fall 2-3 years ago picking up her grandaughter, tripped and fell. Had an MRI around 2016/2017 which revealed deteriorating discs.  Pt states feeling more urinary urgency when going to bed after drinking sweet tea, not as much as when she drinks water. Able to control her bladder to go to the bathrom. MD knows about it too. Denies bowel problems.  Denies LE paresthesia.   Has not had PT for her back before.  Sometimes feels pain in her R thigh (does not think it goes past her knee) which makes R LE weight bearing difficult.  R thigh pain along the L5 dermatome pathway when it occurs.     Patient Stated Goals Pt expresses desire to get better.    Currently in Pain? No/denies   Pain Score 0-No pain  patient took pain medication.    Pain Onset More than a month ago                                 PT Education - 07/20/17 1035    Education provided Yes   Education Details ther-ex, HEP   Person(s) Educated Patient   Methods Explanation;Demonstration;Tactile cues;Verbal  cues   Comprehension Returned demonstration;Verbalized understanding        Objectives  Pt states that the lumbar towel roll helps a little at home.    Ther-ex  Sitting with lumbar towel roll x 2 min             Then with bilateral low rows yellow band 10x3 with 5 second holds             Seated bilateral shoulder extension isometrics (hands on thighs) 10x5 seconds for 3 sets  Standing L shoulder adduction resisting yellow band 10x  standing leg press resisting double green band 10x3 each LE with bilateral UE assist  Standing bilateral shoulder extension resisting yellow band 10x3   Improved exercise technique, movement at target joints, use of target muscles after min to mod verbal, visual, tactile cues.    Pt was recommended to try using a heating pad to her low back in sitting with a comfortable lumbar towel roll to see if it helps decrease her  pain in the mornings. Pt verbalized understanding.   Manual therapy Sitting: STM R and L lumbar paraspinal muscles. Decreased muscle tension    Continued working on gentle trunk extension, trunk and hip strengthening and decreasing bilateral lumbar paraspinal muscle tension. Pt tolerated session well without complain of increased pain.       PT Long Term Goals - 07/07/17 1158      PT LONG TERM GOAL #1   Title Patient will have a decrease in back pain to 3/10 or less at worst to promote ability to ambulate longer distances, stand up after prolonged sitting, and tolerate positions such as side lying when sleeping.    Baseline 6/10 at worst (07/07/2017)   Time 6   Period Weeks   Status New   Target Date 08/20/17     PT LONG TERM GOAL #2   Title Patient will improve bilateral hip strength by at least 1/2 MMT grade to promote ability to ambulate longer distances with less pain.    Time 6   Period Weeks   Status New   Target Date 08/20/17     PT LONG TERM GOAL #3   Title Pt will report being able to ambulate around the mall or store will less back pain to promote mobility and ablity to obtain supplies.    Baseline Back pain increases when walking long distances such as when walking in a mall (07/07/2017)   Time 6   Period Weeks   Status New   Target Date 08/20/17               Plan - 07/20/17 1021    Clinical Impression Statement Continued working on gentle trunk extension, trunk and hip strengthening and decreasing bilateral lumbar paraspinal muscle tension. Pt tolerated session well without complain of increased pain.    History and Personal Factors relevant to plan of care: Chronicity of condition   Clinical Presentation Stable   Clinical Presentation due to: Pt tolerated session well without aggravation of symptoms.    Clinical Decision Making Low   Rehab Potential Good   Clinical Impairments Affecting Rehab Potential Chronicity of condition, age   PT Frequency 2x /  week   PT Duration 6 weeks   PT Treatment/Interventions Therapeutic exercise;Neuromuscular re-education;Therapeutic activities;Aquatic Therapy;Iontophoresis 4mg /ml Dexamethasone;Electrical Stimulation;Ultrasound;Patient/family education;Manual techniques;Dry needling   PT Next Visit Plan trunk, hip, core strengthening, control, posture, modalities PRN   Consulted and Agree with Plan of Care Patient  Patient will benefit from skilled therapeutic intervention in order to improve the following deficits and impairments:  Pain, Postural dysfunction, Improper body mechanics, Difficulty walking, Decreased strength  Visit Diagnosis: Chronic bilateral low back pain, with sciatica presence unspecified  Muscle weakness (generalized)     Problem List Patient Active Problem List   Diagnosis Date Noted  . Acute bilateral low back pain 06/10/2017  . Rash 06/10/2017  . Dysuria 06/10/2017  . Atopic dermatitis 04/22/2017  . Sinusitis, acute 11/04/2016  . Carotid stenosis 08/29/2016  . Personal history of tobacco use, presenting hazards to health 06/18/2016  . Essential hypertension 02/29/2016  . Diabetes mellitus without complication ([place]) 56/38/9373  . Onychomycosis of left great toe 04/01/2015  . Constipation 06/08/2014  . Anemia 09/27/2013  . Encounter for immunization 03/27/2013  . Screening for colon cancer 10/16/2011  . Tobacco abuse counseling 08/09/2011  . History of peripheral vascular disease   . Bursitis, ischial   . Osteopenia   . Tobacco abuse disorder   . Vitamin D deficiency   . Arrhythmia 07/05/2011  . Hypothyroidism 07/05/2011  . 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 COPD (chronic obstructive pulmonary disease) ([place])   . Hyperlipidemia   . Screening for breast cancer 02/11/2011    Joneen Boers PT, DPT   07/20/2017, 11:11 AM  [place] [address] [place],  [address] Phone: [phone]   Fax:  [phone]  Name: Jane Davidson MRN: [id number] Date of Birth: Oct 12, 1941

## 2017-07-20 NOTE — Patient Instructions (Signed)
  Seated low rows.  Yellow band attached to other side of door knob. Make sure that the door is closed.    Sitting on a chair (with a towel roll at your low back)   Pull both ends of the (yellow) band towards your hips to feel your abdominal muscles work.     Hold for 5 seconds comfortably.     Repeat 10 times    Perform 3 sets daily

## 2017-07-22 ENCOUNTER — Ambulatory Visit: Payer: PPO

## 2017-07-27 ENCOUNTER — Ambulatory Visit: Payer: PPO

## 2017-07-27 DIAGNOSIS — M6281 Muscle weakness (generalized): Secondary | ICD-10-CM

## 2017-07-27 DIAGNOSIS — G8929 Other chronic pain: Secondary | ICD-10-CM

## 2017-07-27 DIAGNOSIS — M545 Low back pain: Principal | ICD-10-CM

## 2017-07-27 NOTE — Therapy (Signed)
[facility] [address], [place], [address] Phone: [phone]   Fax:  [phone]  Physical Therapy Treatment  Patient Details  Name: Jane Davidson MRN: [id number] Date of Birth: Feb 15, 1941 Referring Provider: Yvetta Coder. Vidal Schwalbe, FNP  Encounter Date: 07/27/2017      PT End of Session - 07/27/17 0903    Visit Number 5   Number of Visits 13   Date for PT Re-Evaluation 08/20/17   Authorization Type 5   Authorization Time Period of 10 G-code   PT Start Time 0903   PT Stop Time 0943   PT Time Calculation (min) 40 min   Activity Tolerance Patient tolerated treatment well   Behavior During Therapy [facility] for tasks assessed/performed      Past Medical History:  Diagnosis Date  . Abnormal Pap smear of cervix 2001   Biopsy normal  . Bursitis NEC   . COPD (chronic obstructive pulmonary disease) ([place])   . History of peripheral vascular disease 2001   s/p bilateral aorto-iliac-femoral bypass /duke  . Hyperlipidemia   . hypothyroid   . Osteopenia 2009    T scores - 1.5 DEXA 2009  . Screening for breast cancer May 2012   Norma mammogram  . Tobacco abuse disorder   . Vitamin D deficiency April 2011   replaced withDrsido for level of 11.3 ng/ml  . Vitamin D deficiency April 2011   replaced withDrsido for level of 11.3 ng/ml    Past Surgical History:  Procedure Laterality Date  . BREAST BIOPSY Left    benign  . CATARACT EXTRACTION  Sept 2011  . CHOLECYSTECTOMY    . HERNIA REPAIR  Jun 2011   [name]   . TUBAL LIGATION      There were no vitals filed for this visit.      Subjective Assessment - 07/27/17 0905    Subjective Back is better. No pain currently.    Pertinent History Low back pain. Does not bother her continuously but when it does, she feels it in her low back. Had a fall 2-3 years ago picking up her grandaughter, tripped and fell. Had an MRI around 2016/2017 which revealed  deteriorating discs.  Pt states feeling more urinary urgency when going to bed after drinking sweet tea, not as much as when she drinks water. Able to control her bladder to go to the bathrom. MD knows about it too. Denies bowel problems.  Denies LE paresthesia.   Has not had PT for her back before.  Sometimes feels pain in her R thigh (does not think it goes past her knee) which makes R LE weight bearing difficult.  R thigh pain along the L5 dermatome pathway when it occurs.     Patient Stated Goals Pt expresses desire to get better.    Currently in Pain? No/denies   Pain Score 0-No pain   Pain Onset More than a month ago                                 PT Education - 07/27/17 0913    Education provided Yes   Education Details ther-ex   [employer]) Educated Patient   Methods Explanation;Demonstration;Verbal cues;Tactile cues   Comprehension Returned demonstration;Verbalized understanding        Objectives    Ther-ex  Standing rows yellow band 10x5 seconds for 2 sets  Sitting on dyna disc  Gentle manually resisted trunk flexion 10x5 seconds for 2 sets. Pt tendency to push to the R  Gentle trunk perturbation 1 min x 3  Forward wedding march 32 ft x2 to promote hip strengthening.   standing leg press resisting double green band 10x3 each LE with bilateral UE assist  Standing bilateral shoulder extension resisting yellow band 10x3  Improved exercise technique, movement at target joints, use of target muscles after min to mod verbal, visual, tactile cues.    Manual therapy Sitting: STM R and L lumbar paraspinal muscles. Decreased muscle tension. Slight TTP L low back which eases quickly with rest.    Continued working on trunk muscle strengthening, hip strengthening, and decreasing lumbar paraspinal muscle tension. Pt tolerated session well without aggravation of symptoms.              PT Long Term Goals - 07/07/17 1158      PT LONG  TERM GOAL #1   Title Patient will have a decrease in back pain to 3/10 or less at worst to promote ability to ambulate longer distances, stand up after prolonged sitting, and tolerate positions such as side lying when sleeping.    Baseline 6/10 at worst (07/07/2017)   Time 6   Period Weeks   Status New   Target Date 08/20/17     PT LONG TERM GOAL #2   Title Patient will improve bilateral hip strength by at least 1/2 MMT grade to promote ability to ambulate longer distances with less pain.    Time 6   Period Weeks   Status New   Target Date 08/20/17     PT LONG TERM GOAL #3   Title Pt will report being able to ambulate around the mall or store will less back pain to promote mobility and ablity to obtain supplies.    Baseline Back pain increases when walking long distances such as when walking in a mall (07/07/2017)   Time 6   Period Weeks   Status New   Target Date 08/20/17               Plan - 07/27/17 0917    Clinical Impression Statement Continued working on trunk muscle strengthening, hip strengthening, and decreasing lumbar paraspinal muscle tension. Pt tolerated session well without aggravation of symptoms.    History and Personal Factors relevant to plan of care: Chronicity of condition   Clinical Presentation Stable   Clinical Presentation due to: Pt states back feeling better   Clinical Decision Making Low   Rehab Potential Good   Clinical Impairments Affecting Rehab Potential Chronicity of condition, age   PT Frequency 2x / week   PT Duration 6 weeks   PT Treatment/Interventions Therapeutic exercise;Neuromuscular re-education;Therapeutic activities;Aquatic Therapy;Iontophoresis 4mg /ml Dexamethasone;Electrical Stimulation;Ultrasound;Patient/family education;Manual techniques;Dry needling   PT Next Visit Plan trunk, hip, core strengthening, control, posture, modalities PRN   Consulted and Agree with Plan of Care Patient      Patient will benefit from skilled  therapeutic intervention in order to improve the following deficits and impairments:  Pain, Postural dysfunction, Improper body mechanics, Difficulty walking, Decreased strength  Visit Diagnosis: Chronic bilateral low back pain, with sciatica presence unspecified  Muscle weakness (generalized)     Problem List Patient Active Problem List   Diagnosis Date Noted  . Acute bilateral low back pain 06/10/2017  . Rash 06/10/2017  . Dysuria 06/10/2017  . Atopic dermatitis 04/22/2017  . Sinusitis, acute 11/04/2016  . Carotid stenosis 08/29/2016  . Personal history  of tobacco use, presenting hazards to health 06/18/2016  . Essential hypertension 02/29/2016  . Diabetes mellitus without complication ([place]) 26/83/4196  . Onychomycosis of left great toe 04/01/2015  . Constipation 06/08/2014  . Anemia 09/27/2013  . Encounter for immunization 03/27/2013  . Screening for colon cancer 10/16/2011  . Tobacco abuse counseling 08/09/2011  . History of peripheral vascular disease   . Bursitis, ischial   . Osteopenia   . Tobacco abuse disorder   . Vitamin D deficiency   . Arrhythmia 07/05/2011  . Hypothyroidism 07/05/2011  . 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 COPD (chronic obstructive pulmonary disease) ([place])   . Hyperlipidemia   . Screening for breast cancer 02/11/2011    Joneen Boers PT, DPT  07/27/2017, 9:48 AM  [facility] [address] [place], [address] Phone: [phone]   Fax:  [phone]  Name: Jane Davidson MRN: [id number] Date of Birth: 12/31/1940

## 2017-07-29 ENCOUNTER — Ambulatory Visit: Payer: PPO

## 2017-07-29 DIAGNOSIS — M545 Low back pain: Principal | ICD-10-CM

## 2017-07-29 DIAGNOSIS — G8929 Other chronic pain: Secondary | ICD-10-CM

## 2017-07-29 DIAGNOSIS — M6281 Muscle weakness (generalized): Secondary | ICD-10-CM

## 2017-07-29 NOTE — Therapy (Signed)
**Note Jane-Identified via Obfuscation** [facility] [address] [place], [address] Phone: [phone]   Fax:  [phone]  Physical Therapy Treatment  Patient Details  Name: Jane Davidson MRN: [id number] Date of Birth: 25-Nov-1940 Referring Provider: Yvetta Coder. Vidal Schwalbe, FNP  Encounter Date: 07/29/2017      PT End of Session - 07/29/17 1419    Visit Number 6   Number of Visits 13   Date for PT Re-Evaluation 08/20/17   Authorization Type 6   Authorization Time Period of 10 G-code   PT Start Time 1419   PT Stop Time 1502   PT Time Calculation (min) 43 min   Activity Tolerance Patient tolerated treatment well   Behavior During Therapy WFL for tasks assessed/performed      Past Medical History:  Diagnosis Date  . Abnormal Pap smear of cervix 2001   Biopsy normal  . Bursitis NEC   . COPD (chronic obstructive pulmonary disease) ([place])   . History of peripheral vascular disease 2001   s/p bilateral aorto-iliac-femoral bypass /[facility]ke  . Hyperlipidemia   . hypothyroid   . Osteopenia 2009    T scores - 1.5 DEXA 2009  . Screening for breast cancer May 2012   [facility]a mammogram  . Tobacco abuse disorder   . Vitamin D deficiency April 2011   replaced withDrsido for level of 11.3 ng/ml  . Vitamin D deficiency April 2011   replaced withDrsido for level of 11.3 ng/ml    Past Surgical History:  Procedure Laterality Date  . BREAST BIOPSY Left    benign  . CATARACT EXTRACTION  Sept 2011  . CHOLECYSTECTOMY    . HERNIA REPAIR  Jun 2011   [name]   . TUBAL LIGATION      There were no vitals filed for this visit.      Subjective Assessment - 07/29/17 1421    Subjective Back is better today. Back is better since last session. No pain currently.    Pertinent History Low back pain. Does not bother her continuously but when it does, she feels it in her low back. Had a fall 2-3 years ago picking up her grandaughter, tripped and fell. Had an  MRI around 2016/2017 which revealed deteriorating discs.  Pt states feeling more urinary urgency when going to bed after drinking sweet tea, not as much as when she drinks water. Able to control her bladder to go to the bathrom. MD knows about it too. Denies bowel problems.  Denies LE paresthesia.   Has not had PT for her back before.  Sometimes feels pain in her R thigh (does not think it goes past her knee) which makes R LE weight bearing difficult.  R thigh pain along the L5 dermatome pathway when it occurs.     Patient Stated Goals Pt expresses desire to get better.    Currently in Pain? No/denies   Pain Score 0-No pain   Pain Onset More than a month ago                                 PT Education - 07/29/17 1422    Education provided Yes   Education Details ther-ex   [employer]) Educated Patient   Methods Explanation;Demonstration;Tactile cues;Verbal cues   Comprehension Verbalized understanding;Returned demonstration        Objectives    Ther-ex   Sitting on dyna disc  Gentle manually resisted trunk flexion 10x5 seconds for 2 sets. To promote trunk muscle activation             Gentle trunk perturbation 1 min x 3 to promote trunk muscle activation   Bilateral shoulder extension resisting yellow band 10x3  standing leg press resisting double green band 10x3 each LE with bilateral UE assist  Standing rows yellow band 10x5 seconds for 2 sets  Forward wedding march 32 ft x2 to promote hip strengthening.    Improved exercise technique, movement at target joints, use of target muscles after min to mod verbal, visual, tactile cues.    Manual therapy  Sitting on chair, resting forehead on forearms on mat table: STM R and L lumbar paraspinal muscles. Decreased muscle tension.    Pt making very good progress with decreased back pain secondary to pt subjective reports. Continued working on hip and trunk strengthening, as well as  thoracic extension to decrease low back pressure and lumbar paraspinal muscle tension.           PT Long Term Goals - 07/07/17 1158      PT LONG TERM GOAL #1   Title Patient will have a decrease in back pain to 3/10 or less at worst to promote ability to ambulate longer distances, stand up after prolonged sitting, and tolerate positions such as side lying when sleeping.    Baseline 6/10 at worst (07/07/2017)   Time 6   Period Weeks   Status New   Target Date 08/20/17     PT LONG TERM GOAL #2   Title Patient will improve bilateral hip strength by at least 1/2 MMT grade to promote ability to ambulate longer distances with less pain.    Time 6   Period Weeks   Status New   Target Date 08/20/17     PT LONG TERM GOAL #3   Title Pt will report being able to ambulate around the mall or store will less back pain to promote mobility and ablity to obtain supplies.    Baseline Back pain increases when walking long distances such as when walking in a mall (07/07/2017)   Time 6   Period Weeks   Status New   Target Date 08/20/17               Plan - 07/29/17 1426    Clinical Impression Statement Pt making very good progress with decreased back pain secondary to pt subjective reports. Continued working on hip and trunk strengthening, as well as thoracic extension to decrease low back pressure and lumbar paraspinal muscle tension.    History and Personal Factors relevant to plan of care: Chronicity of condition   Clinical Presentation Stable   Clinical Presentation due to: Back is better per pt.   Clinical Decision Making Low   Rehab Potential Good   Clinical Impairments Affecting Rehab Potential Chronicity of condition, age   PT Frequency 2x / week   PT Duration 6 weeks   PT Treatment/Interventions Therapeutic exercise;Neuromuscular re-education;Therapeutic activities;Aquatic Therapy;Iontophoresis 4mg /ml Dexamethasone;Electrical Stimulation;Ultrasound;Patient/family  education;Manual techniques;Dry needling   PT Next Visit Plan trunk, hip, core strengthening, control, posture, modalities PRN   Consulted and Agree with Plan of Care Patient      Patient will benefit from skilled therapeutic intervention in order to improve the following deficits and impairments:  Pain, Postural dysfunction, Improper body mechanics, Difficulty walking, Decreased strength  Visit Diagnosis: Chronic bilateral low back pain, with sciatica presence unspecified  Muscle weakness (generalized)  Problem List Patient Active Problem List   Diagnosis Date Noted  . Acute bilateral low back pain 06/10/2017  . Rash 06/10/2017  . Dysuria 06/10/2017  . Atopic dermatitis 04/22/2017  . Sinusitis, acute 11/04/2016  . Carotid stenosis 08/29/2016  . Personal history of tobacco use, presenting hazards to health 06/18/2016  . Essential hypertension 02/29/2016  . Diabetes mellitus without complication ([place]) 97/74/1423  . Onychomycosis of left great toe 04/01/2015  . Constipation 06/08/2014  . Anemia 09/27/2013  . Encounter for immunization 03/27/2013  . Screening for colon cancer 10/16/2011  . Tobacco abuse counseling 08/09/2011  . History of peripheral vascular disease   . Bursitis, ischial   . Osteopenia   . Tobacco abuse disorder   . Vitamin D deficiency   . Arrhythmia 07/05/2011  . Hypothyroidism 07/05/2011  . 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 COPD (chronic obstructive pulmonary disease) ([place])   . Hyperlipidemia   . Screening for breast cancer 02/11/2011    Joneen Boers PT, DPT  07/29/2017, 3:16 PM  [facility] [address], [place], [address] Phone: [phone]   Fax:  [phone]  Name: Jane Davidson MRN: [id number] Date of Birth: September 11, 1941

## 2017-08-01 ENCOUNTER — Other Ambulatory Visit: Payer: Self-pay | Admitting: Internal Medicine

## 2017-08-04 ENCOUNTER — Ambulatory Visit: Payer: PPO

## 2017-08-04 DIAGNOSIS — M545 Low back pain: Principal | ICD-10-CM

## 2017-08-04 DIAGNOSIS — M6281 Muscle weakness (generalized): Secondary | ICD-10-CM

## 2017-08-04 DIAGNOSIS — G8929 Other chronic pain: Secondary | ICD-10-CM

## 2017-08-04 NOTE — Therapy (Addendum)
[facility] [address], [place], [address] Phone: [phone]   Fax:  [phone]  Physical Therapy Treatment  Patient Details  Name: Jane Davidson MRN: [id number] Date of Birth: 12/25/1940 Referring Provider: Yvetta Coder. Vidal Schwalbe, FNP  Encounter Date: 08/04/2017      PT End of Session - 08/04/17 0902    Visit Number 7   Number of Visits 13   Date for PT Re-Evaluation 08/20/17   Authorization Type 7   Authorization Time Period of 10 G-code   PT Start Time 0903   PT Stop Time 0945   PT Time Calculation (min) 42 min   Activity Tolerance Patient tolerated treatment well   Behavior During Therapy [facility] for tasks assessed/performed      Past Medical History:  Diagnosis Date  . Abnormal Pap smear of cervix 2001   Biopsy normal  . Bursitis NEC   . COPD (chronic obstructive pulmonary disease) ([place])   . History of peripheral vascular disease 2001   s/p bilateral aorto-iliac-femoral bypass /[facility]ke  . Hyperlipidemia   . hypothyroid   . Osteopenia 2009    T scores - 1.5 DEXA 2009  . Screening for breast cancer May 2012   [facility]a mammogram  . Tobacco abuse disorder   . Vitamin D deficiency April 2011   replaced withDrsido for level of 11.3 ng/ml  . Vitamin D deficiency April 2011   replaced withDrsido for level of 11.3 ng/ml    Past Surgical History:  Procedure Laterality Date  . BREAST BIOPSY Left    benign  . CATARACT EXTRACTION  Sept 2011  . CHOLECYSTECTOMY    . HERNIA REPAIR  Jun 2011   [name]   . TUBAL LIGATION      There were no vitals filed for this visit.      Subjective Assessment - 08/04/17 0905    Subjective Back is pretty good. No pain currently. Walking longer distances for her back is getting better.    Pertinent History Low back pain. Does not bother her continuously but when it does, she feels it in her low back. Had a fall 2-3 years ago picking up her grandaughter,  tripped and fell. Had an MRI around 2016/2017 which revealed deteriorating discs.  Pt states feeling more urinary urgency when going to bed after drinking sweet tea, not as much as when she drinks water. Able to control her bladder to go to the bathrom. MD knows about it too. Denies bowel problems.  Denies LE paresthesia.   Has not had PT for her back before.  Sometimes feels pain in her R thigh (does not think it goes past her knee) which makes R LE weight bearing difficult.  R thigh pain along the L5 dermatome pathway when it occurs.     Patient Stated Goals Pt expresses desire to get better.    Currently in Pain? No/denies   Pain Score 0-No pain   Pain Onset More than a month ago                                 PT Education - 08/04/17 0911    Education provided Yes   Education Details ther-ex   [employer]) Educated Patient   Methods Explanation;Demonstration;Tactile cues;Verbal cues   Comprehension Returned demonstration;Verbalized understanding        Objectives    Ther-ex   Sitting on dyna  disc Gentle manually resisted trunk flexion 10x5 seconds for 2 sets. To promote trunk muscle activation  Gentle trunk perturbation 1 min x 3 to promote trunk muscle activation              Bilateral shoulder extension resisting yellow band 10x3    Hip extension isometrics 10x3 with 5 second holds each LE   Decreased bilateral lumbar paraspinal muscle tension palpated  Standing L shoulder adduction resisting yellow band 10x3  Standing bilateral shoulder extension resisting yellow band 10x3   Improved exercise technique, movement at target joints, use of target muscles after min to mod verbal, visual, tactile cues.      Manual therapy  Sitting on chair, resting forehead on forearms on mat table: STM R and L lumbar paraspinal muscles. Decreased muscle tension.     Good carry over of decreased back pain. Continued working on  increasing trunk muscle and hip muscle use, posture, and decreasing lumbar paraspinal muscle tension. Pt tolerated session well without aggravation of symptoms.            PT Long Term Goals - 07/07/17 1158      PT LONG TERM GOAL #1   Title Patient will have a decrease in back pain to 3/10 or less at worst to promote ability to ambulate longer distances, stand up after prolonged sitting, and tolerate positions such as side lying when sleeping.    Baseline 6/10 at worst (07/07/2017)   Time 6   Period Weeks   Status New   Target Date 08/20/17     PT LONG TERM GOAL #2   Title Patient will improve bilateral hip strength by at least 1/2 MMT grade to promote ability to ambulate longer distances with less pain.    Time 6   Period Weeks   Status New   Target Date 08/20/17     PT LONG TERM GOAL #3   Title Pt will report being able to ambulate around the mall or store will less back pain to promote mobility and ablity to obtain supplies.    Baseline Back pain increases when walking long distances such as when walking in a mall (07/07/2017)   Time 6   Period Weeks   Status New   Target Date 08/20/17          Clinical impression statement: Good carry over of decreased back pain. Continued working on increasing trunk muscle and hip muscle use, posture, and decreasing lumbar paraspinal muscle tension. Pt tolerated session well without aggravation of symptoms.      Plan - 08/04/17 0901    History and Personal Factors relevant to plan of care: Chronicity of condition   Clinical Presentation Stable   Clinical Decision Making Low   Rehab Potential Good   Clinical Impairments Affecting Rehab Potential Chronicity of condition, age   PT Frequency 2x / week   PT Duration 6 weeks   PT Treatment/Interventions Therapeutic exercise;Neuromuscular re-education;Therapeutic activities;Aquatic Therapy;Iontophoresis 4mg /ml Dexamethasone;Electrical Stimulation;Ultrasound;Patient/family  education;Manual techniques;Dry needling   PT Next Visit Plan trunk, hip, core strengthening, control, posture, modalities PRN   Consulted and Agree with Plan of Care Patient      Patient will benefit from skilled therapeutic intervention in order to improve the following deficits and impairments:  Pain, Postural dysfunction, Improper body mechanics, Difficulty walking, Decreased strength  Visit Diagnosis: Chronic bilateral low back pain, with sciatica presence unspecified  Muscle weakness (generalized)     Problem List Patient Active Problem List   Diagnosis Date  Noted  . Acute bilateral low back pain 06/10/2017  . Rash 06/10/2017  . Dysuria 06/10/2017  . Atopic dermatitis 04/22/2017  . Sinusitis, acute 11/04/2016  . Carotid stenosis 08/29/2016  . Personal history of tobacco use, presenting hazards to health 06/18/2016  . Essential hypertension 02/29/2016  . Diabetes mellitus without complication ([place]) 37/94/3276  . Onychomycosis of left great toe 04/01/2015  . Constipation 06/08/2014  . Anemia 09/27/2013  . Encounter for immunization 03/27/2013  . Screening for colon cancer 10/16/2011  . Tobacco abuse counseling 08/09/2011  . History of peripheral vascular disease   . Bursitis, ischial   . Osteopenia   . Tobacco abuse disorder   . Vitamin D deficiency   . Arrhythmia 07/05/2011  . Hypothyroidism 07/05/2011  . 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 COPD (chronic obstructive pulmonary disease) ([place])   . Hyperlipidemia   . Screening for breast cancer 02/11/2011    Joneen Boers PT, DPT   08/04/2017, 9:47 AM  [facility] [address] [place], [address] Phone: [phone]   Fax:  [phone]  Name: Jane Davidson MRN: [id number] Date of Birth: [birth year]

## 2017-08-11 ENCOUNTER — Telehealth: Payer: Self-pay

## 2017-08-11 ENCOUNTER — Ambulatory Visit: Payer: PPO

## 2017-08-11 DIAGNOSIS — M545 Low back pain: Principal | ICD-10-CM

## 2017-08-11 DIAGNOSIS — G8929 Other chronic pain: Secondary | ICD-10-CM

## 2017-08-11 DIAGNOSIS — M6281 Muscle weakness (generalized): Secondary | ICD-10-CM

## 2017-08-11 NOTE — Patient Instructions (Signed)
Standing gentle lumbar extension  Standing and holding onto something sturdy for support    Gently lean back for about 2 seconds comfortably.    Repeat 10 times   Perform 3 sets daily.

## 2017-08-11 NOTE — Telephone Encounter (Signed)
Patient came into office on 07/28/2017 with knot on right shin. Patient hit leg on shopping cart. Spoke with Dr. Derrel Nip and advised patient that PCP did not think she has DVT. Patient was told to monitor knot and let us know if it got any worse.

## 2017-08-11 NOTE — Therapy (Signed)
[facility] [address] [place], [address] Phone: [phone]   Fax:  [phone]  Physical Therapy Treatment  Patient Details  Name: LATIQUA Davidson MRN: [id number] Date of Birth: Aug 14, 1941 Referring Provider: Yvetta Coder. Vidal Schwalbe, FNP  Encounter Date: 08/11/2017      PT End of Session - 08/11/17 1349    Visit Number 8   Number of Visits 13   Date for PT Re-Evaluation 08/20/17   Authorization Type 8   Authorization Time Period of 10 G-code   PT Start Time 3295   PT Stop Time 1430   PT Time Calculation (min) 41 min   Activity Tolerance Patient tolerated treatment well   Behavior During Therapy WFL for tasks assessed/performed      Past Medical History:  Diagnosis Date  . Abnormal Pap smear of cervix 2001   Biopsy normal  . Bursitis NEC   . COPD (chronic obstructive pulmonary disease) ([place])   . History of peripheral vascular disease 2001   s/p bilateral aorto-iliac-femoral bypass /duke  . Hyperlipidemia   . hypothyroid   . Osteopenia 2009    T scores - 1.5 DEXA 2009  . Screening for breast cancer May 2012   Norma mammogram  . Tobacco abuse disorder   . Vitamin D deficiency April 2011   replaced withDrsido for level of 11.3 ng/ml  . Vitamin D deficiency April 2011   replaced withDrsido for level of 11.3 ng/ml    Past Surgical History:  Procedure Laterality Date  . BREAST BIOPSY Left    benign  . CATARACT EXTRACTION  Sept 2011  . CHOLECYSTECTOMY    . HERNIA REPAIR  Jun 2011   [name]   . TUBAL LIGATION      There were no vitals filed for this visit.      Subjective Assessment - 08/11/17 1350    Subjective Back is pretty good today. No pain. 6/10 back pain at most for the past 7 days. Some days it hurts, some days it doesn't.  Yesterday was a 6/10. Pt was hanging a bathroom window curtain rod.  Was also a 6/10 Saturday and does not know what brought it to a 6/10 that day.     Pertinent History Low back pain. Does not bother her continuously but when it does, she feels it in her low back. Had a fall 2-3 years ago picking up her grandaughter, tripped and fell. Had an MRI around 2016/2017 which revealed deteriorating discs.  Pt states feeling more urinary urgency when going to bed after drinking sweet tea, not as much as when she drinks water. Able to control her bladder to go to the bathrom. MD knows about it too. Denies bowel problems.  Denies LE paresthesia.   Has not had PT for her back before.  Sometimes feels pain in her R thigh (does not think it goes past her knee) which makes R LE weight bearing difficult.  R thigh pain along the L5 dermatome pathway when it occurs.     Patient Stated Goals Pt expresses desire to get better.    Currently in Pain? No/denies   Pain Score 0-No pain   Pain Onset More than a month ago                                 PT Education - 08/11/17 1356    Education provided  Yes   Education Details ther-ex, HEP   Person(s) Educated Patient   Methods Explanation;Demonstration;Tactile cues;Verbal cues;Handout   Comprehension Returned demonstration;Verbalized understanding        Objectives  standing up quickly from sitting is better. Thinks walking longer distances is a little better.     Ther-ex  Pt demonstration of placing her curtain rod  Pt reaches up and to the R. Per pt, that increased her low back pain.   Seated bilateral shoulder extension isometrics 10x5 seconds for 2 sets   Standing LE leg press resisting blue band with bilateral UE assist   R 10x3  L 10x3  Standing gentle back extension   10x3  Reviewed and given as part of her HEP. Pt demonstrated and verbalized understanding  Standing bilateral shoulder extension resisting yellow band 10x3  Standing rows yellow band 10x3  Sitting with lumbar towel roll  Seated hip extension isometrics 10x3 with 5 second holds each  LE  Forward wedding march 32 ft x 4 to promote glute strengthening  Side stepping 32 ft to the L and 32 ft for 2 sets to the R to promote glute med muscle use   Standing L shoulder adduction resisting yellow band 10x3  Standing mini squats with bilateral UE assist 10x    Improved exercise technique, movement at target joints, use of target muscles after mod verbal, visual, tactile cues.    Worked on gentle thoracic and lumbar extension, trunk and hip strengthening to promote ability to perform tasks at home with less back pain.               PT Long Term Goals - 07/07/17 1158      PT LONG TERM GOAL #1   Title Patient will have a decrease in back pain to 3/10 or less at worst to promote ability to ambulate longer distances, stand up after prolonged sitting, and tolerate positions such as side lying when sleeping.    Baseline 6/10 at worst (07/07/2017)   Time 6   Period Weeks   Status New   Target Date 08/20/17     PT LONG TERM GOAL #2   Title Patient will improve bilateral hip strength by at least 1/2 MMT grade to promote ability to ambulate longer distances with less pain.    Time 6   Period Weeks   Status New   Target Date 08/20/17     PT LONG TERM GOAL #3   Title Pt will report being able to ambulate around the mall or store will less back pain to promote mobility and ablity to obtain supplies.    Baseline Back pain increases when walking long distances such as when walking in a mall (07/07/2017)   Time 6   Period Weeks   Status New   Target Date 08/20/17               Plan - 08/11/17 1431    Clinical Impression Statement Worked on gentle thoracic and lumbar extension, trunk and hip strengthening to promote ability to perform tasks at home with less back pain.    History and Personal Factors relevant to plan of care: Chronicity of condition   Clinical Presentation Stable   Clinical Presentation due to: standing up quickly from sitting is better  per pt   Clinical Decision Making Low   Rehab Potential Good   Clinical Impairments Affecting Rehab Potential Chronicity of condition, age   PT Frequency 2x / week   PT Duration 6  weeks   PT Treatment/Interventions Therapeutic exercise;Neuromuscular re-education;Therapeutic activities;Aquatic Therapy;Iontophoresis 4mg /ml Dexamethasone;Electrical Stimulation;Ultrasound;Patient/family education;Manual techniques;Dry needling   PT Next Visit Plan trunk, hip, core strengthening, control, posture, modalities PRN   Consulted and Agree with Plan of Care Patient      Patient will benefit from skilled therapeutic intervention in order to improve the following deficits and impairments:  Pain, Postural dysfunction, Improper body mechanics, Difficulty walking, Decreased strength  Visit Diagnosis: Chronic bilateral low back pain, with sciatica presence unspecified  Muscle weakness (generalized)     Problem List Patient Active Problem List   Diagnosis Date Noted  . Acute bilateral low back pain 06/10/2017  . Rash 06/10/2017  . Dysuria 06/10/2017  . Atopic dermatitis 04/22/2017  . Sinusitis, acute 11/04/2016  . Carotid stenosis 08/29/2016  . Personal history of tobacco use, presenting hazards to health 06/18/2016  . Essential hypertension 02/29/2016  . Diabetes mellitus without complication ([place]) 56/25/6389  . Onychomycosis of left great toe 04/01/2015  . Constipation 06/08/2014  . Anemia 09/27/2013  . Encounter for immunization 03/27/2013  . Screening for colon cancer 10/16/2011  . Tobacco abuse counseling 08/09/2011  . History of peripheral vascular disease   . Bursitis, ischial   . Osteopenia   . Tobacco abuse disorder   . Vitamin D deficiency   . Arrhythmia 07/05/2011  . Hypothyroidism 07/05/2011  . 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 COPD (chronic obstructive pulmonary disease) ([place])   . Hyperlipidemia   . Screening for breast cancer 02/11/2011     Joneen Boers PT, DPT   08/11/2017, 8:01 PM  [facility] [address], [place], [address] Phone: [phone]   Fax:  [phone]  Name: DONNELL BEAUCHAMP MRN: [id number] Date of Birth: 01-05-1941

## 2017-08-13 ENCOUNTER — Ambulatory Visit: Payer: PPO | Attending: Family

## 2017-08-13 ENCOUNTER — Ambulatory Visit: Payer: PPO

## 2017-08-13 DIAGNOSIS — M545 Low back pain: Secondary | ICD-10-CM | POA: Diagnosis not present

## 2017-08-13 DIAGNOSIS — G8929 Other chronic pain: Secondary | ICD-10-CM | POA: Insufficient documentation

## 2017-08-13 DIAGNOSIS — M6281 Muscle weakness (generalized): Secondary | ICD-10-CM | POA: Diagnosis not present

## 2017-08-13 NOTE — Patient Instructions (Addendum)
  Strengthening: Resisted Adduction    Hold yellow band in left hand, arm out. Pull arm toward hip (you do not have to go past it as shown in the picture). Do not twist or rotate trunk.  Repeat __10__ times per set. Do __3__ sets per session. Do ___1_ sessions per day.  http://orth.exer.us/834   Copyright  VHI. All rights reserved.      You do not have to do the gentle lumbar extension HEP anymore

## 2017-08-13 NOTE — Therapy (Signed)
[facility] [address] [place], [address] Phone: [phone]   Fax:  [phone]  Physical Therapy Treatment  Patient Details  Name: Jane Davidson MRN: [id number] Date of Birth: 01-08-1941 Referring Provider: Yvetta Coder. Vidal Schwalbe, FNP  Encounter Date: 08/13/2017      PT End of Session - 08/13/17 1346    Visit Number 9   Number of Visits 13   Date for PT Re-Evaluation 08/20/17   Authorization Type 9   Authorization Time Period of 10 G-code   PT Start Time 1346   PT Stop Time 1429   PT Time Calculation (min) 43 min   Activity Tolerance Patient tolerated treatment well   Behavior During Therapy WFL for tasks assessed/performed      Past Medical History:  Diagnosis Date  . Abnormal Pap smear of cervix 2001   Biopsy normal  . Bursitis NEC   . COPD (chronic obstructive pulmonary disease) ([place])   . History of peripheral vascular disease 2001   s/p bilateral aorto-iliac-femoral bypass /[facility]ke  . Hyperlipidemia   . hypothyroid   . Osteopenia 2009    T scores - 1.5 DEXA 2009  . Screening for breast cancer May 2012   [facility] mammogram  . Tobacco abuse disorder   . Vitamin D deficiency April 2011   replaced withDrsido for level of 11.3 ng/ml  . Vitamin D deficiency April 2011   replaced withDrsido for level of 11.3 ng/ml    Past Surgical History:  Procedure Laterality Date  . BREAST BIOPSY Left    benign  . CATARACT EXTRACTION  Sept 2011  . CHOLECYSTECTOMY    . HERNIA REPAIR  Jun 2011   [name]   . TUBAL LIGATION      There were no vitals filed for this visit.      Subjective Assessment - 08/13/17 1347    Subjective Back is good. No pain currently. Back has not bothered her for the past 2 days.    Pertinent History Low back pain. Does not bother her continuously but when it does, she feels it in her low back. Had a fall 2-3 years ago picking up her grandaughter, tripped and fell. Had  an MRI around 2016/2017 which revealed deteriorating discs.  Pt states feeling more urinary urgency when going to bed after drinking sweet tea, not as much as when she drinks water. Able to control her bladder to go to the bathrom. MD knows about it too. Denies bowel problems.  Denies LE paresthesia.   Has not had PT for her back before.  Sometimes feels pain in her R thigh (does not think it goes past her knee) which makes R LE weight bearing difficult.  R thigh pain along the L5 dermatome pathway when it occurs.     Patient Stated Goals Pt expresses desire to get better.    Currently in Pain? No/denies   Pain Score 0-No pain   Pain Onset More than a month ago            [facility] PT Assessment - 08/13/17 1413      Strength   Right Hip Flexion 4/5   Right Hip Extension 4/5  seated manually resisted leg press   Right Hip ABduction 4-/5   Left Hip Flexion 4+/5   Left Hip Extension 4/5  seated manually resisted leg press   Left Hip ABduction 4/5  PT Education - 08/13/17 1400    Education provided Yes   Education Details ther-ex, HEP   Person(s) Educated Patient   Methods Explanation;Demonstration;Tactile cues;Verbal cues;Handout   Comprehension Verbalized understanding;Returned demonstration        Objectives  Walked a good distance today and her back did not bother her.     Ther-ex   Standing L shoulder adduction resisting yellow band 10x3   Standing gentle back extension              10x, then 5x   Pt educated that she can stop that HEP. Pt demonstrated and verbalized understanding.   Seated bilateral shoulder extension isometrics 10x5 seconds for 2 sets    Forward wedding march 32 ft x 4 to promote glute strengthening  Side stepping 32 ft to the L and 32 ft to the R for 2 sets to promote glute med muscle use              SLS on L LE to decrease R lateral shift, light touch assist 10x5 seconds for 3 sets    Improved upright posture  Seated manually resisted hip flexion, leg press, S/L hip abduction 1x each way for each LE   Standing rows yellow band 10x3   Standing LE leg press resisting blue band with bilateral UE assist              R 10x3             L 10x3    Improved exercise technique, movement at target joints, use of target muscles after min to mod verbal, visual, tactile cues.    Improved L hip abductor, R hip extension, and bilateral hip flexor strength as well as ability to ambulate longer distances without back pain (based on pt reports). Continued working on decreasing R lateral shift, upper thoracic extension to decrease low back pressure, hip and trunk strengthening to promote ability to perform standing tasks with less back pain. Improving posture observed.          PT Long Term Goals - 07/07/17 1158      PT LONG TERM GOAL #1   Title Patient will have a decrease in back pain to 3/10 or less at worst to promote ability to ambulate longer distances, stand up after prolonged sitting, and tolerate positions such as side lying when sleeping.    Baseline 6/10 at worst (07/07/2017)   Time 6   Period Weeks   Status New   Target Date 08/20/17     PT LONG TERM GOAL #2   Title Patient will improve bilateral hip strength by at least 1/2 MMT grade to promote ability to ambulate longer distances with less pain.    Time 6   Period Weeks   Status New   Target Date 08/20/17     PT LONG TERM GOAL #3   Title Pt will report being able to ambulate around the mall or store will less back pain to promote mobility and ablity to obtain supplies.    Baseline Back pain increases when walking long distances such as when walking in a mall (07/07/2017)   Time 6   Period Weeks   Status New   Target Date 08/20/17               Plan - 08/13/17 1402    Clinical Impression Statement Improved L hip abductor, R hip extension, and bilateral hip flexor strength as well as ability  to ambulate longer  distances without back pain (based on pt reports). Continued working on decreasing R lateral shift, upper thoracic extension to decrease low back pressure, hip and trunk strengthening to promote ability to perform standing tasks with less back pain. Improving posture observed.    History and Personal Factors relevant to plan of care: Chronicity of condition   Clinical Presentation Stable   Clinical Presentation due to: no back pain for the past 2 days   Clinical Decision Making Low   Rehab Potential Good   Clinical Impairments Affecting Rehab Potential Chronicity of condition, age   PT Frequency 2x / week   PT Duration 6 weeks   PT Treatment/Interventions Therapeutic exercise;Neuromuscular re-education;Therapeutic activities;Aquatic Therapy;Iontophoresis 4mg /ml Dexamethasone;Electrical Stimulation;Ultrasound;Patient/family education;Manual techniques;Dry needling   PT Next Visit Plan trunk, hip, core strengthening, control, posture, modalities PRN   Consulted and Agree with Plan of Care Patient      Patient will benefit from skilled therapeutic intervention in order to improve the following deficits and impairments:  Pain, Postural dysfunction, Improper body mechanics, Difficulty walking, Decreased strength  Visit Diagnosis: Chronic bilateral low back pain, with sciatica presence unspecified  Muscle weakness (generalized)     Problem List Patient Active Problem List   Diagnosis Date Noted  . Acute bilateral low back pain 06/10/2017  . Rash 06/10/2017  . Dysuria 06/10/2017  . Atopic dermatitis 04/22/2017  . Sinusitis, acute 11/04/2016  . Carotid stenosis 08/29/2016  . Personal history of tobacco use, presenting hazards to health 06/18/2016  . Essential hypertension 02/29/2016  . Diabetes mellitus without complication ([place]) 16/07/9603  . Onychomycosis of left great toe 04/01/2015  . Constipation 06/08/2014  . Anemia 09/27/2013  . Encounter for immunization  03/27/2013  . Screening for colon cancer 10/16/2011  . Tobacco abuse counseling 08/09/2011  . History of peripheral vascular disease   . Bursitis, ischial   . Osteopenia   . Tobacco abuse disorder   . Vitamin D deficiency   . Arrhythmia 07/05/2011  . Hypothyroidism 07/05/2011  . 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 COPD (chronic obstructive pulmonary disease) ([place])   . Hyperlipidemia   . Screening for breast cancer 02/11/2011    Joneen Boers PT, DPT   08/13/2017, 2:44 PM  [facility] [address], [place], [address] Phone: [phone]   Fax:  [phone]  Name: MINNIE LEGROS MRN: [id number] Date of Birth: 1941-03-29

## 2017-08-17 ENCOUNTER — Ambulatory Visit: Payer: PPO

## 2017-08-17 DIAGNOSIS — M545 Low back pain: Secondary | ICD-10-CM | POA: Diagnosis not present

## 2017-08-17 DIAGNOSIS — G8929 Other chronic pain: Secondary | ICD-10-CM

## 2017-08-17 DIAGNOSIS — M6281 Muscle weakness (generalized): Secondary | ICD-10-CM

## 2017-08-17 NOTE — Patient Instructions (Addendum)
Single leg stands   Holding onto something sturdy for support    Stand on your left leg for 5 seconds   Keep your pelvis level   Repeat 10 times   Perform 3 sets daily.

## 2017-08-17 NOTE — Therapy (Signed)
[facility] [address], [place], [address] Phone: [phone]   Fax:  [phone]  Physical Therapy Treatment And Discharge Summary  Patient Details  Name: Jane Davidson MRN: [id number] Date of Birth: [birth year] Referring Provider: Yvetta Coder. Vidal Schwalbe, FNP   Encounter Date: 08/17/2017  PT End of Session - 08/17/17 0948    Visit Number  10    Number of Visits  13    Date for PT Re-Evaluation  08/20/17    Authorization Type  10    Authorization Time Period  of 10 G-code    PT Start Time  0948    PT Stop Time  1030    PT Time Calculation (min)  42 min    Activity Tolerance  Patient tolerated treatment well    Behavior During Therapy  [facility] for tasks assessed/performed       Past Medical History:  Diagnosis Date  . Abnormal Pap smear of cervix 2001   Biopsy normal  . Bursitis NEC   . COPD (chronic obstructive pulmonary disease) ([place])   . History of peripheral vascular disease 2001   s/p bilateral aorto-iliac-femoral bypass /[facility]  . Hyperlipidemia   . hypothyroid   . Osteopenia 2009    T scores - 1.5 DEXA 2009  . Screening for breast cancer May 2012   [facility] mammogram  . Tobacco abuse disorder   . Vitamin D deficiency April 2011   replaced withDrsido for level of 11.3 ng/ml  . Vitamin D deficiency April 2011   replaced withDrsido for level of 11.3 ng/ml    Past Surgical History:  Procedure Laterality Date  . BREAST BIOPSY Left    benign  . CATARACT EXTRACTION  Sept 2011  . CHOLECYSTECTOMY    . HERNIA REPAIR  Jun 2011   [name]   . TUBAL LIGATION      There were no vitals filed for this visit.  Subjective Assessment - 08/17/17 0949    Subjective  Back is good. No pain currently. Back was good during the weekend. Has allergies real bad. Went to a corn maze this weekend.  4-5/10 back pain at most for the past 7 days.  Walked a  good distance at the [employer] and her back "did good."  Feels like she is progressing towards her personal goals for PT.  Feels like she is good to go with her exercises at home.  Feels like today is a good day to graduate PT.     Pertinent History  Low back pain. Does not bother her continuously but when it does, she feels it in her low back. Had a fall 2-3 years ago picking up her grandaughter, tripped and fell. Had an MRI around 2016/2017 which revealed deteriorating discs.  Pt states feeling more urinary urgency when going to bed after drinking sweet tea, not as much as when she drinks water. Able to control her bladder to go to the bathrom. MD knows about it too. Denies bowel problems.  Denies LE paresthesia.   Has not had PT for her back before.  Sometimes feels pain in her R thigh (does not think it goes past her knee) which makes R LE weight bearing difficult.  R thigh pain along the L5 dermatome pathway when it occurs.      Patient Stated Goals  Pt expresses desire to get better.     Currently in Pain?  No/denies    Pain Score  0-No pain    Pain Onset  More than a month ago                              PT Education - 08/17/17 0953    Education provided  Yes    Education Details  ther-ex    [employer]) Educated  Patient    Methods  Explanation;Demonstration;Tactile cues;Verbal cues    Comprehension  Verbalized understanding;Returned demonstration         Objectives     Ther-ex   Standing L shoulder adduction resisting yellow band 10x3 with 5 seconds   SLS on L LE to decrease R lateral shift, light touch assist 10x5 seconds for 3 sets              Improved upright posture  sitting on dyna disc              Manually resisted trunk flexion 10x5 seconds for 3 sets        Low rows resisting yellow band 10x5 seconds      Then with red band 10x5 seconds for 2 sets  Forward wedding march 32 ft x 4 to promote glute strengthening  Reviewed plan of care: continue progress with HEP secondary to pt good  progress   Standing LE leg press resisting blue band with bilateral UE assist  R 10x3 L 10x3    Standing rows red band 10x3   Improved exercise technique, movement at target joints, use of target muscles after min to mod verbal, visual, tactile cues.     Pt demonstrates overall decreased low back pain level at worst, improved hip strength, and ability to ambulate longer distances with less back pain. Per pt, she feels like she is progressing towards her goals for PT and can continue with her HEP. Skilled PT servics discharged with pt continuing with her exercises at home.           PT Long Term Goals - 08/17/17 0954      PT LONG TERM GOAL #1   Title  Patient will have a decrease in back pain to 3/10 or less at worst to promote ability to ambulate longer distances, stand up after prolonged sitting, and tolerate positions such as side lying when sleeping.     Baseline  6/10 at worst (07/07/2017); 4-5/10 back pain at worst for the past 7 days (08/17/2017)    Time  6    Period  Weeks    Status  Partially Met    Target Date  08/20/17      PT LONG TERM GOAL #2   Title  Patient will improve bilateral hip strength by at least 1/2 MMT grade to promote ability to ambulate longer distances with less pain.     Time  6    Period  Weeks    Status  Partially Met    Target Date  08/20/17      PT LONG TERM GOAL #3   Title  Pt will report being able to ambulate around the mall or store will less back pain to promote mobility and ablity to obtain supplies.     Baseline  Back pain increases when walking long distances such as when walking in a mall (07/07/2017); able to walk a good distance at the corn maze and her back did well (08/17/2017)    Time  6    Period  Weeks    Status  Achieved    Target Date  08/20/17            Plan - Aug 24, 2017 0954    Clinical Impression Statement  Pt demonstrates overall decreased low back pain level at worst, improved hip  strength, and ability to ambulate longer distances with less back pain. Per pt, she feels like she is progressing towards her goals for PT and can continue with her HEP. Skilled PT servics discharged with pt continuing with her exercises at home.      History and Personal Factors relevant to plan of care:  Chronicity of condition    Clinical Presentation  Stable    Clinical Presentation due to:  4-5/10 back pain at worst for the past 7 days    Clinical Decision Making  Low    Rehab Potential  Good    Clinical Impairments Affecting Rehab Potential  Chronicity of condition, age    PT Frequency  --    PT Duration  --    PT Treatment/Interventions  Therapeutic exercise;Neuromuscular re-education;Therapeutic activities;Patient/family education;Manual techniques    PT Next Visit Plan  Continue progress with her HEP    Consulted and Agree with Plan of Care  Patient       Patient will benefit from skilled therapeutic intervention in order to improve the following deficits and impairments:  Pain, Postural dysfunction, Improper body mechanics, Difficulty walking, Decreased strength  Visit Diagnosis: Chronic bilateral low back pain, with sciatica presence unspecified  Muscle weakness (generalized)   G-Codes - 08/24/17 1029    Functional Assessment Tool Used (Outpatient Only)  Clinical presentation, patient interview    Functional Limitation  Mobility: Walking and moving around    Mobility: Walking and Moving Around Goal Status [phone])  At least 20 percent but less than 40 percent impaired, limited or restricted    Mobility: Walking and Moving Around Discharge Status [phone])  At least 20 percent but less than 40 percent impaired, limited or restricted       Problem List Patient Active Problem List   Diagnosis Date Noted  . Acute bilateral low back pain 06/10/2017  . Rash 06/10/2017  . Dysuria 06/10/2017  . Atopic dermatitis 04/22/2017  . Sinusitis, acute 11/04/2016  . Carotid stenosis  08/29/2016  . Personal history of tobacco use, presenting hazards to health 06/18/2016  . Essential hypertension 02/29/2016  . Diabetes mellitus without complication ([place]) 09/32/6712  . Onychomycosis of left great toe 04/01/2015  . Constipation 06/08/2014  . Anemia 09/27/2013  . Encounter for immunization 03/27/2013  . Screening for colon cancer 10/16/2011  . Tobacco abuse counseling 08/09/2011  . History of peripheral vascular disease   . Bursitis, ischial   . Osteopenia   . Tobacco abuse disorder   . Vitamin D deficiency   . Arrhythmia 07/05/2011  . Hypothyroidism 07/05/2011  . 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 COPD (chronic obstructive pulmonary disease) ([place])   . Hyperlipidemia   . Screening for breast cancer 02/11/2011   Thank you for your referral.   Joneen Boers PT, DPT   08-24-2017, 2:55 PM  [facility] [address] [place], [address] Phone: [phone]   Fax:  [phone]  Name: Jane Davidson MRN: [id number] Date of Birth: 22-Sep-1941

## 2017-08-19 ENCOUNTER — Ambulatory Visit: Payer: PPO

## 2017-08-21 ENCOUNTER — Ambulatory Visit: Payer: Self-pay | Admitting: *Deleted

## 2017-08-21 NOTE — Telephone Encounter (Signed)
Pt c/o severe cough at night with expectorate, runny nose, sinus congestion. Care advice given.  Reason for Disposition . SEVERE coughing spells (e.g., whooping sound after coughing, vomiting after coughing)  Answer Assessment - Initial Assessment Questions 1. ONSET: "When did the cough begin?"      saturday 2. SEVERITY: "How bad is the cough today?"      mild 3. RESPIRATORY DISTRESS: "Describe your breathing."      Not short of breath 4. FEVER: "Do you have a fever?" If so, ask: "What is your temperature, how was it measured, and when did it start?"     no 5. SPUTUM: "Describe the color of your sputum" (clear, white, yellow, green)     yello 6. HEMOPTYSIS: "Are you coughing up any blood?" If so ask: "How much?" (flecks, streaks, tablespoons, etc.)     no 7. CARDIAC HISTORY: "Do you have any history of heart disease?" (e.g., heart attack, congestive heart failure)      no 8. LUNG HISTORY: "Do you have any history of lung disease?"  (e.g., pulmonary embolus, asthma, emphysema)     cope 9. PE RISK FACTORS: "Do you have a history of blood clots?" (or: recent major surgery, recent prolonged travel, bedridden )     no 10. OTHER SYMPTOMS: "Do you have any other symptoms?" (e.g., runny nose, wheezing, chest pain)       Runny nose 11. PREGNANCY: "Is there any chance you are pregnant?" "When was your last menstrual period?"       no 12. TRAVEL: "Have you traveled out of the country in the last month?" (e.g., travel history, exposures)       no  Protocols used: [place]

## 2017-08-24 ENCOUNTER — Ambulatory Visit: Payer: PPO

## 2017-08-25 DIAGNOSIS — B9689 Other specified bacterial agents as the cause of diseases classified elsewhere: Secondary | ICD-10-CM | POA: Diagnosis not present

## 2017-08-25 DIAGNOSIS — J208 Acute bronchitis due to other specified organisms: Secondary | ICD-10-CM | POA: Diagnosis not present

## 2017-09-01 ENCOUNTER — Encounter (INDEPENDENT_AMBULATORY_CARE_PROVIDER_SITE_OTHER): Payer: Self-pay | Admitting: Vascular Surgery

## 2017-09-01 ENCOUNTER — Ambulatory Visit (INDEPENDENT_AMBULATORY_CARE_PROVIDER_SITE_OTHER): Payer: PPO | Admitting: Vascular Surgery

## 2017-09-01 ENCOUNTER — Ambulatory Visit (INDEPENDENT_AMBULATORY_CARE_PROVIDER_SITE_OTHER): Payer: PPO

## 2017-09-01 VITALS — BP 138/68 | HR 66 | Resp 14 | Ht 65.0 in | Wt 147.0 lb

## 2017-09-01 DIAGNOSIS — I6523 Occlusion and stenosis of bilateral carotid arteries: Secondary | ICD-10-CM | POA: Diagnosis not present

## 2017-09-01 DIAGNOSIS — Z720 Tobacco use: Secondary | ICD-10-CM

## 2017-09-01 DIAGNOSIS — E785 Hyperlipidemia, unspecified: Secondary | ICD-10-CM | POA: Diagnosis not present

## 2017-09-01 DIAGNOSIS — I10 Essential (primary) hypertension: Secondary | ICD-10-CM | POA: Diagnosis not present

## 2017-09-01 DIAGNOSIS — E119 Type 2 diabetes mellitus without complications: Secondary | ICD-10-CM | POA: Diagnosis not present

## 2017-09-01 NOTE — Assessment & Plan Note (Signed)
We had a discussion for approximately 3 minutes regarding the absolute need for smoking cessation due to the deleterious nature of tobacco on the vascular system. We discussed the tobacco use would diminish patency of any intervention, and likely significantly worsen progressio of disease. The patient voices their understanding of the importance of smoking cessation.

## 2017-09-01 NOTE — Assessment & Plan Note (Signed)
blood glucose control important in reducing the progression of atherosclerotic disease. Also, involved in wound healing. On appropriate medications.  

## 2017-09-01 NOTE — Assessment & Plan Note (Signed)
lipid control important in reducing the progression of atherosclerotic disease. Continue statin therapy  

## 2017-09-01 NOTE — Patient Instructions (Signed)
Carotid Artery Disease The carotid arteries are arteries on both sides of the neck. They carry blood to the brain. Carotid artery disease is when the arteries get smaller (narrow) or get blocked. If these arteries get smaller or get blocked, you are more likely to have a stroke or warning stroke (transient ischemic attack). Follow these instructions at home:  Take medicines as told by your doctor. Make sure you understand all your medicine instructions. Do not stop your medicines without talking to your doctor first.  Follow your doctor's diet instructions. It is important to eat a healthy diet that includes plenty of: ? Fresh fruits. ? Vegetables. ? Lean meats.  Avoid: ? High-fat foods. ? High-sodium foods. ? Foods that are fried, overly processed, or have poor nutritional value.  Stay a healthy weight.  Stay active. Get at least 30 minutes of activity every day.  Do not smoke.  Limit alcohol use to: ? No more than 2 drinks a day for men. ? No more than 1 drink a day for women who are not pregnant.  Do not use illegal drugs.  Keep all doctor visits as told. Get help right away if:  You have sudden weakness or loss of feeling (numbness) on one side of the body, such as the face, arm, or leg.  You have sudden confusion.  You have trouble speaking (aphasia) or understanding.  You have sudden trouble seeing out of one or both eyes.  You have sudden trouble walking.  You have dizziness or feel like you might pass out (faint).  You have a loss of balance or your movements are not steady (uncoordinated).  You have a sudden, severe headache with no known cause.  You have trouble swallowing (dysphagia). Call your local emergency services (911 in [place] Do notdrive yourself to the clinic or hospital. This information is not intended to replace advice given to you by your health care provider. Make sure you discuss any questions you have with your health care  provider. Document Released: 09/15/2012 Document Revised: 03/06/2016 Document Reviewed: 03/30/2013 Elsevier Interactive Patient Education  2018 [employer]  

## 2017-09-01 NOTE — Progress Notes (Signed)
MRN : [id number]  Jane Davidson is a 76 y.o. (24-Jul-1941) female who presents with chief complaint of  Chief Complaint  Patient presents with  . Carotid    1 year carotid  .  History of Present Illness: Patient returns in follow-up of her carotid disease.  She does continue to smoke but understands the importance of smoking cessation on her vascular disease.  She is almost 3 years status post right carotid endarterectomy for high-grade stenosis.  She denies any focal neurologic symptoms and has been doing well since her last visit. Specifically, the patient denies amaurosis fugax, speech or swallowing difficulties, or arm or leg weakness or numbness.  Her carotid duplex today shows mild intimal hyperplasia without significant recurrent stenosis in the right carotid endarterectomy site and slight progression of her disease but still remaining in the 40-59% stenosis in the left internal carotid artery.  Current Outpatient Medications  Medication Sig Dispense Refill  . ADVAIR DISKUS 100-50 MCG/DOSE AEPB USE 1 INHALATION BY MOUTH TWICE DAILY 14 each 3  . Cholecalciferol (VITAMIN D3) 1000 UNITS CAPS Take 1 capsule by mouth daily.    . clobetasol ointment (TEMOVATE) 9.83 % Apply 1 application topically 2 (two) times daily. 30 g 5  . clotrimazole (LOTRIMIN) 1 % cream Apply 1 application topically 2 (two) times daily. 30 g 1  . cyanocobalamin 100 MCG tablet Take 100 mcg by mouth daily.      Marland Kitchen doxycycline (VIBRA-TABS) 100 MG tablet Take by mouth.    . fluticasone (FLONASE) 50 MCG/ACT nasal spray SHAKE LIQUID AND USE 2 SPRAYS IN EACH NOSTRIL DAILY 16 g 2  . guaiFENesin-codeine 100-10 MG/5ML syrup Take by mouth.    . hydrOXYzine (ATARAX/VISTARIL) 25 MG tablet Take 1 tablet (25 mg total) by mouth at bedtime as needed (insomnia). 30-60 minutes prior to bedtime for itching. 60 tablet 0  . Lactulose 20 GM/30ML SOLN 30 ml every 4 hours until constipation is relieved 236 mL 3  . lidocaine (LIDODERM) 5  % Place 1 patch onto the skin daily. Remove & Discard patch within 12 hours. 30 patch 0  . losartan (COZAAR) 50 MG tablet TAKE 1 TABLET(50 MG) BY MOUTH DAILY 90 tablet 3  . meloxicam (MOBIC) 15 MG tablet TAKE 1 TABLET(15 MG) BY MOUTH DAILY 30 tablet 0  . predniSONE (DELTASONE) 10 MG tablet Take 4 tablets ( total 40 mg) by mouth for 2 days; take 3 tablets ( total 30 mg) by mouth for 2 days; take 2 tablets ( total 20 mg) by mouth for 1 day; take 1 tablet ( total 10 mg) by mouth for 1 day. 17 tablet 0  . sertraline (ZOLOFT) 50 MG tablet Take 1 tablet (50 mg total) by mouth daily. 90 tablet 1  . simvastatin (ZOCOR) 20 MG tablet TAKE 1 TABLET BY MOUTH DAILY 90 tablet 0  . SYNTHROID 100 MCG tablet TAKE 1 TABLET BY MOUTH EVERY DAY 90 tablet 3  . traMADol (ULTRAM) 50 MG tablet Take 1 tablet (50 mg total) by mouth every 6 (six) hours as needed. 120 tablet 0  . triamcinolone cream (KENALOG) 0.1 % Apply 1 application topically 2 (two) times daily. 30 g 5  . Vitamin D, Ergocalciferol, (DRISDOL) 50000 units CAPS capsule Take by mouth.     No current facility-administered medications for this visit.     Past Medical History:  Diagnosis Date  . Abnormal Pap smear of cervix 2001   Biopsy normal  . Bursitis NEC   .  COPD (chronic obstructive pulmonary disease) ([place])   . History of peripheral vascular disease 2001   s/p bilateral aorto-iliac-femoral bypass /[facility]  . Hyperlipidemia   . hypothyroid   . Osteopenia 2009    T scores - 1.5 DEXA 2009  . Screening for breast cancer May 2012   [facility] mammogram  . Tobacco abuse disorder   . Vitamin D deficiency April 2011   replaced withDrsido for level of 11.3 ng/ml  . Vitamin D deficiency April 2011   replaced withDrsido for level of 11.3 ng/ml    Past Surgical History:  Procedure Laterality Date  . BREAST BIOPSY Left    benign  . CATARACT EXTRACTION  Sept 2011  . CHOLECYSTECTOMY    . HERNIA REPAIR  Jun 2011   [name]   . TUBAL LIGATION       Social History Social History   Tobacco Use  . Smoking status: Current Every Day Smoker    Packs/day: 0.75    Years: 40.00    Pack years: 30.00    Types: Cigarettes  . Smokeless tobacco: Never Used  Substance Use Topics  . Alcohol use: No  . Drug use: No    Family History Family History  Problem Relation Age of Onset  . Hypertension Mother   . Arthritis Mother   . Diabetes Father     Allergies  Allergen Reactions  . Pollen Extract     REVIEW OF SYSTEMS (Negative unless checked)  Constitutional: [] Weight loss  [] Fever  [] Chills Cardiac: [] Chest pain   [] Chest pressure   [] Palpitations   [] Shortness of breath when laying flat   [] Shortness of breath at rest   [] Shortness of breath with exertion. Vascular:  [] Pain in legs with walking   [] Pain in legs at rest   [] Pain in legs when laying flat   [] Claudication   [] Pain in feet when walking  [] Pain in feet at rest  [] Pain in feet when laying flat   [] History of DVT   [] Phlebitis   [] Swelling in legs   [] Varicose veins   [] Non-healing ulcers Pulmonary:   [] Uses home oxygen   [] Productive cough   [] Hemoptysis   [] Wheeze  [x] COPD   [] Asthma Neurologic:  [] Dizziness  [] Blackouts   [] Seizures   [] History of stroke   [] History of TIA  [] Aphasia   [] Temporary blindness   [] Dysphagia   [] Weakness or numbness in arms   [] Weakness or numbness in legs Musculoskeletal:  [x] Arthritis   [] Joint swelling   [] Joint pain   [] Low back pain Hematologic:  [] Easy bruising  [] Easy bleeding   [] Hypercoagulable state   [x] Anemic  [] Hepatitis Gastrointestinal:  [] Blood in stool   [] Vomiting blood  [] Gastroesophageal reflux/heartburn   [] Difficulty swallowing. Genitourinary:  [] Chronic kidney disease   [] Difficult urination  [] Frequent urination  [] Burning with urination   [] Blood in urine Skin:  [] Rashes   [] Ulcers   [] Wounds Psychological:  [] History of anxiety   []  History of major depression.     Physical Examination  Vitals:   09/01/17  1600 09/01/17 1601  BP: (!) 144/70 138/68  Pulse: 67 66  Resp: 14   Weight: 66.7 kg (147 lb)   Height: 5\' 5"  (1.651 m)    Body mass index is 24.46 kg/m. Gen:  WD/WN, NAD Head: [place]/AT, No temporalis wasting. Ear/Nose/Throat: Hearing grossly intact, nares w/o erythema or drainage, trachea midline Eyes: Conjunctiva clear. Sclera non-icteric Neck: Supple.  No JVD. Right carotid bruit. Pulmonary:  Good air movement, equal and  clear to auscultation bilaterally.  Cardiac: RRR, normal S1, S2 Vascular:  Vessel Right Left  Radial Palpable Palpable                                    Musculoskeletal: M/S 5/5 throughout.  No deformity or atrophy.  Neurologic: CN 2-12 intact. Sensation grossly intact in extremities.  Symmetrical.  Speech is fluent. Motor exam as listed above. Psychiatric: Judgment intact, Mood & affect appropriate for pt's clinical situation. Dermatologic: No rashes or ulcers noted.  No cellulitis or open wounds.      CBC Lab Results  Component Value Date   WBC 7.6 10/02/2016   HGB 12.4 10/02/2016   HCT 36.6 10/02/2016   MCV 90.6 10/02/2016   PLT 273.0 10/02/2016    BMET    Component Value Date/Time   NA 133 (L) 05/07/2017 0828   NA 132 (L) 11/03/2014 0430   K 4.4 05/07/2017 0828   K 4.6 11/03/2014 0430   CL 101 05/07/2017 0828   CL 102 11/03/2014 0430   CO2 27 05/07/2017 0828   CO2 24 11/03/2014 0430   GLUCOSE 115 (H) 05/07/2017 0828   GLUCOSE 134 (H) 11/03/2014 0430   BUN 11 05/07/2017 0828   BUN 19 (H) 11/03/2014 0430   CREATININE 0.77 05/07/2017 0828   CREATININE 0.80 11/03/2014 0430   CALCIUM 9.3 05/07/2017 0828   CALCIUM 8.4 (L) 11/03/2014 0430   GFRNONAA >60 11/03/2014 0430   GFRAA >60 11/03/2014 0430   CrCl cannot be calculated (Patient's most recent lab result is older than the maximum 21 days allowed.).  COAG Lab Results  Component Value Date   INR 1.0 11/03/2014    Radiology No results found.    Assessment/Plan Essential  hypertension blood pressure control important in reducing the progression of atherosclerotic disease. On appropriate oral medications.   Diabetes mellitus without complication (HCC) blood glucose control important in reducing the progression of atherosclerotic disease. Also, involved in wound healing. On appropriate medications.   Hyperlipidemia lipid control important in reducing the progression of atherosclerotic disease. Continue statin therapy   Tobacco abuse disorder We had a discussion for approximately 3 minutes regarding the absolute need for smoking cessation due to the deleterious nature of tobacco on the vascular system. We discussed the tobacco use would diminish patency of any intervention, and likely significantly worsen progressio of disease. The patient voices their understanding of the importance of smoking cessation.   Carotid stenosis Her carotid duplex today shows mild intimal hyperplasia without significant recurrent stenosis in the right carotid endarterectomy site and slight progression of her disease but still remaining in the 40-59% stenosis in the left internal carotid artery. Continue Zocor and aspirin.  Recheck in 6 months. Recommended smoking cessation.    Leotis Pain, MD  09/01/2017 4:31 PM    This note was created with Dragon medical transcription system.  Any errors from dictation are purely unintentional

## 2017-09-01 NOTE — Assessment & Plan Note (Signed)
Her carotid duplex today shows mild intimal hyperplasia without significant recurrent stenosis in the right carotid endarterectomy site and slight progression of her disease but still remaining in the 40-59% stenosis in the left internal carotid artery. Continue Zocor and aspirin.  Recheck in 6 months. Recommended smoking cessation.

## 2017-09-01 NOTE — Assessment & Plan Note (Signed)
blood pressure control important in reducing the progression of atherosclerotic disease. On appropriate oral medications.  

## 2017-09-14 ENCOUNTER — Other Ambulatory Visit: Payer: Self-pay | Admitting: Internal Medicine

## 2017-09-14 DIAGNOSIS — Z1231 Encounter for screening mammogram for malignant neoplasm of breast: Secondary | ICD-10-CM

## 2017-09-17 ENCOUNTER — Other Ambulatory Visit: Payer: Self-pay | Admitting: Internal Medicine

## 2017-09-23 ENCOUNTER — Telehealth: Payer: Self-pay | Admitting: Internal Medicine

## 2017-09-23 NOTE — Telephone Encounter (Signed)
Copied from [place] [id number]. Topic: Quick Communication - Rx Refill/Question >> Sep 23, 2017 10:18 AM Scherrie Gerlach wrote: Pt would like to know if Dr Derrel Nip will call in prednisone for her head congestion, then nose starts running.  Pt feels like this is all in her head. Pt went to [facility] 4 wks ago and given abx. Pt also given cough med. Pt states it really did not get better.  Preferred Pharmacy (with phone number or street name): [id number] - [place], [place] [phone] (Phone) [phone] (Fax)  Pt would like a call back

## 2017-09-24 ENCOUNTER — Ambulatory Visit: Payer: Self-pay

## 2017-09-24 NOTE — Telephone Encounter (Signed)
  Reason for Disposition . [1] Taking antibiotic > 7 days AND [2] nasal discharge not improved  Answer Assessment - Initial Assessment Questions 1. ANTIBIOTIC: "What antibiotic are you receiving?" "How many times per day?"     Not sure of name 2. ONSET: "When was the antibiotic started?"     Finished a couple of weeks ago 3. PAIN: "How bad is the sinus pain?"   (Scale 1-10; mild, moderate or severe)   - MILD (1-3): doesn't interfere with normal activities    - MODERATE (4-7): interferes with normal activities (e.g., work or school) or awakens from sleep   - SEVERE (8-10): excruciating pain and patient unable to do any normal activities        7 4. FEVER: "Do you have a fever?" If so, ask: "What is it, how was it measured, and when did it start?"      No 5. SYMPTOMS: "Are there any other symptoms you're concerned about?" If so, ask: "When did it start?"     Cough at night - thick white phlegm 6. PREGNANCY: "Is there any chance you are pregnant?" "When was your last menstrual period?"     No  Protocols used: SINUS INFECTION ON ANTIBIOTIC FOLLOW-UP CALL-A-AH Seen at urgent care a month ago and treated with antibiotic and cough medication. Still has symptoms. Appointment made for tomorrow.

## 2017-09-25 ENCOUNTER — Ambulatory Visit: Payer: PPO | Admitting: Family Medicine

## 2017-09-25 ENCOUNTER — Encounter: Payer: Self-pay | Admitting: Family Medicine

## 2017-09-25 VITALS — BP 116/60 | HR 67 | Temp 97.9°F | Wt 150.0 lb

## 2017-09-25 DIAGNOSIS — J3489 Other specified disorders of nose and nasal sinuses: Secondary | ICD-10-CM | POA: Insufficient documentation

## 2017-09-25 DIAGNOSIS — R0981 Nasal congestion: Secondary | ICD-10-CM | POA: Diagnosis not present

## 2017-09-25 MED ORDER — LORATADINE 10 MG PO TABS: 10.0000 mg | ORAL_TABLET | Freq: Every day | ORAL | 11 refills | Status: DC

## 2017-09-25 MED ORDER — BENZONATATE 100 MG PO CAPS: 100.0000 mg | ORAL_CAPSULE | Freq: Two times a day (BID) | ORAL | 0 refills | Status: DC | PRN

## 2017-09-25 NOTE — Patient Instructions (Addendum)
I don't see infection today. I think you have runny nose possibly related to allergies. Treat with claritin (loratadine) 10mg  daily.  Try tessalon perls for cough.  Update [place] with how you're doing over the next 1-2 weeks.  Continue other medicines including flonase.  Keep working on quitting smoking.

## 2017-09-25 NOTE — Progress Notes (Signed)
BP 116/60 (BP Location: Left Arm, Patient Position: Sitting, Cuff Size: Normal)   Pulse 67   Temp 97.9 F (36.6 C) (Oral)   Wt 150 lb (68 kg)   SpO2 100%   BMI 24.96 kg/m    CC: sinus congestion Subjective:    Patient ID: Jane Davidson, female    DOB: 09/04/1941, 76 y.o.   MRN: [id number]  HPI: TAKHIA SPOON is a 76 y.o. female presenting on 09/25/2017 for Sinus Problem (tightness in head, nasal congestion, runny nose. Started about 1 mo ago. Has taken abx. Sxs improved but did not clear up)   1 month ago seen at [facility] and dx with bronchitis, treated with doxycycline and codeine cough syrup. Note reviewed. She did improve some but symptoms never fully resolved.   Persistent productive cough of thick clear mucous worse at night time. Persistent tightness around nose, rhinorrhea, PNdrainage. Some right jaw discomfort. Persistent head congestion. Main concern is clear rhinorrhea that has been present over the past month. She has also been taking corcedin. No longer taking hydroxyzine. She has tried benadryl which helps but it is too sedating.   No fevers/chills, ST, ear pain, dyspnea or wheezing.  Daughter sick recently.  Known COPD on advair. Current every day smoker <1ppd.  Known allergic rhinitis on flonase.   Relevant past medical, surgical, family and social history reviewed and updated as indicated. Interim medical history since our last visit reviewed. Allergies and medications reviewed and updated. Outpatient Medications Prior to Visit  Medication Sig Dispense Refill  . ADVAIR DISKUS 100-50 MCG/DOSE AEPB USE 1 INHALATION BY MOUTH TWICE DAILY 14 each 3  . Cholecalciferol (VITAMIN D3) 1000 UNITS CAPS Take 1 capsule by mouth daily.    . clobetasol ointment (TEMOVATE) 7.78 % Apply 1 application topically 2 (two) times daily. 30 g 5  . clotrimazole (LOTRIMIN) 1 % cream Apply 1 application topically 2 (two) times daily. 30 g 1  . cyanocobalamin 100 MCG tablet Take 100 mcg by  mouth daily.      . fluticasone (FLONASE) 50 MCG/ACT nasal spray SHAKE LIQUID AND USE 2 SPRAYS IN EACH NOSTRIL DAILY 16 g 2  . Lactulose 20 GM/30ML SOLN 30 ml every 4 hours until constipation is relieved 236 mL 3  . lidocaine (LIDODERM) 5 % Place 1 patch onto the skin daily. Remove & Discard patch within 12 hours. 30 patch 0  . losartan (COZAAR) 50 MG tablet TAKE 1 TABLET(50 MG) BY MOUTH DAILY 90 tablet 3  . meloxicam (MOBIC) 15 MG tablet TAKE 1 TABLET(15 MG) BY MOUTH DAILY 30 tablet 0  . sertraline (ZOLOFT) 50 MG tablet Take 1 tablet (50 mg total) by mouth daily. 90 tablet 1  . simvastatin (ZOCOR) 20 MG tablet TAKE 1 TABLET BY MOUTH DAILY 90 tablet 1  . SYNTHROID 100 MCG tablet TAKE 1 TABLET BY MOUTH EVERY DAY 90 tablet 3  . traMADol (ULTRAM) 50 MG tablet Take 1 tablet (50 mg total) by mouth every 6 (six) hours as needed. 120 tablet 0  . triamcinolone cream (KENALOG) 0.1 % Apply 1 application topically 2 (two) times daily. 30 g 5  . Vitamin D, Ergocalciferol, (DRISDOL) 50000 units CAPS capsule Take by mouth.    Marland Kitchen guaiFENesin-codeine 100-10 MG/5ML syrup Take by mouth.    . predniSONE (DELTASONE) 10 MG tablet Take 4 tablets ( total 40 mg) by mouth for 2 days; take 3 tablets ( total 30 mg) by mouth for 2 days; take 2 tablets (  total 20 mg) by mouth for 1 day; take 1 tablet ( total 10 mg) by mouth for 1 day. 17 tablet 0  . hydrOXYzine (ATARAX/VISTARIL) 25 MG tablet Take 1 tablet (25 mg total) by mouth at bedtime as needed (insomnia). 30-60 minutes prior to bedtime for itching. (Patient not taking: Reported on 09/25/2017) 60 tablet 0   No facility-administered medications prior to visit.      Per HPI unless specifically indicated in ROS section below Review of Systems     Objective:    BP 116/60 (BP Location: Left Arm, Patient Position: Sitting, Cuff Size: Normal)   Pulse 67   Temp 97.9 F (36.6 C) (Oral)   Wt 150 lb (68 kg)   SpO2 100%   BMI 24.96 kg/m   Wt Readings from Last 3  Encounters:  09/25/17 150 lb (68 kg)  09/01/17 147 lb (66.7 kg)  07/08/17 157 lb (71.2 kg)    Physical Exam  Constitutional: She appears well-developed and well-nourished. No distress.  HENT:  Head: Normocephalic and atraumatic.  Right Ear: Hearing, tympanic membrane, external ear and ear canal normal.  Left Ear: Hearing, tympanic membrane, external ear and ear canal normal.  Nose: Rhinorrhea (clear) present. No mucosal edema. Right sinus exhibits no maxillary sinus tenderness and no frontal sinus tenderness. Left sinus exhibits no maxillary sinus tenderness and no frontal sinus tenderness.  Mouth/Throat: Uvula is midline, oropharynx is clear and moist and mucous membranes are normal. No oropharyngeal exudate, posterior oropharyngeal edema, posterior oropharyngeal erythema or tonsillar abscesses.  Eyes: Conjunctivae and EOM are normal. Pupils are equal, round, and reactive to light. No scleral icterus.  Neck: Normal range of motion. Neck supple.  Cardiovascular: Normal rate, regular rhythm, normal heart sounds and intact distal pulses.  No murmur heard. Pulmonary/Chest: Effort normal and breath sounds normal. No respiratory distress. She has no wheezes. She has no rales.  Lungs clear  Musculoskeletal: She exhibits no edema.  Lymphadenopathy:    She has no cervical adenopathy.  Skin: Skin is warm and dry. No rash noted.  Psychiatric: She has a normal mood and affect.  Nursing note and vitals reviewed.     Assessment & Plan:   Problem List Items Addressed This Visit    Rhinorrhea - Primary    Clear rhinorrhea worse this month anticipate related to allergic rhinitis in setting of recent bronchitis, not improving with nasal steroid. Benadryl too sedating. I don't see active bacterial infection present at this time, and in diabetes history rec against steroid course.  Recommend she start claritin 10mg  daily over next 2 wks and update [place] with effect.  Trial tessalon perls for cough.         Sinus congestion    Ongoing, but I don't think she has active bacterial sinusitis at this time. rec claritin trial, update with effect. Given recent illness and COPD hx, low threshold to start abx if not improving with treatment.           Follow up plan: Return if symptoms worsen or fail to improve.  Ria Bush, MD

## 2017-09-25 NOTE — Assessment & Plan Note (Signed)
Clear rhinorrhea worse this month anticipate related to allergic rhinitis in setting of recent bronchitis, not improving with nasal steroid. Benadryl too sedating. I don't see active bacterial infection present at this time, and in diabetes history rec against steroid course.  Recommend she start claritin 10mg  daily over next 2 wks and update [place] with effect.  Trial tessalon perls for cough.

## 2017-09-25 NOTE — Assessment & Plan Note (Signed)
Ongoing, but I don't think she has active bacterial sinusitis at this time. rec claritin trial, update with effect. Given recent illness and COPD hx, low threshold to start abx if not improving with treatment.

## 2017-10-07 ENCOUNTER — Ambulatory Visit (INDEPENDENT_AMBULATORY_CARE_PROVIDER_SITE_OTHER): Payer: PPO

## 2017-10-07 VITALS — BP 118/64 | HR 71 | Temp 98.0°F | Resp 15 | Ht 64.5 in | Wt 147.8 lb

## 2017-10-07 DIAGNOSIS — Z1331 Encounter for screening for depression: Secondary | ICD-10-CM

## 2017-10-07 DIAGNOSIS — Z0000 Encounter for general adult medical examination without abnormal findings: Secondary | ICD-10-CM | POA: Diagnosis not present

## 2017-10-07 NOTE — Progress Notes (Signed)
Subjective:   Jane Davidson is a 76 y.o. female who presents for Medicare Annual (Subsequent) preventive examination.  Review of Systems:  No ROS.  Medicare Wellness Visit. Additional risk factors are reflected in the social history.  Cardiac Risk Factors include: advanced age (>57men, >64 women);hypertension;diabetes mellitus;smoking/ tobacco exposure     Objective:     Vitals: BP 118/64 (BP Location: Right Arm, Patient Position: Sitting, Cuff Size: Normal)   Pulse 71   Temp 98 F (36.7 C) (Oral)   Resp 15   Ht 5' 4.5" (1.638 m)   Wt 147 lb 12.8 oz (67 kg)   SpO2 98%   BMI 24.98 kg/m   Body mass index is 24.98 kg/m.  Advanced Directives 10/07/2017 07/07/2017 12/06/2016 10/03/2016 08/29/2016 10/09/2015  Does Patient Have a Medical Advance Directive? Yes Yes Yes Yes Yes Yes  Type of Paramedic of [place];Living will Living will;Healthcare Power of Attorney Living will [place];Living will [place];Living will [place];Living will  Does patient want to make changes to medical advance directive? - - - No - Patient declined - No - Patient declined  Copy of [place] in Chart? No - copy requested - - No - copy requested - No - copy requested    Tobacco Social History   Tobacco Use  Smoking Status Current Every Day Smoker  . Packs/day: 0.75  . Years: 40.00  . Pack years: 30.00  . Types: Cigarettes  Smokeless Tobacco Never Used     Ready to quit: Not Answered Counseling given: Not Answered   Clinical Intake:  Pre-visit preparation completed: Yes        Nutritional Status: BMI of 19-24  Normal Diabetes: Yes(Followed by PCP)  How often do you need to have someone help you when you read instructions, pamphlets, or other written materials from your doctor or pharmacy?: 1 - Never  Interpreter Needed?: No     Past Medical History:  Diagnosis Date  .  Abnormal Pap smear of cervix 2001   Biopsy normal  . Bursitis NEC   . COPD (chronic obstructive pulmonary disease) ([place])   . History of peripheral vascular disease 2001   s/p bilateral aorto-iliac-femoral bypass /[place]  . Hyperlipidemia   . hypothyroid   . Osteopenia 2009    T scores - 1.5 DEXA 2009  . Screening for breast cancer May 2012   [place] mammogram  . Tobacco abuse disorder   . Vitamin D deficiency April 2011   replaced withDrsido for level of 11.3 ng/ml  . Vitamin D deficiency April 2011   replaced withDrsido for level of 11.3 ng/ml   Past Surgical History:  Procedure Laterality Date  . BREAST BIOPSY Left    benign  . CATARACT EXTRACTION  Sept 2011  . CHOLECYSTECTOMY    . HERNIA REPAIR  Jun 2011   [name]   . TUBAL LIGATION     Family History  Problem Relation Age of Onset  . Hypertension Mother   . Arthritis Mother   . Diabetes Father    Social History   Socioeconomic History  . Marital status: Widowed    Spouse name: None  . Number of children: None  . Years of education: None  . Highest education level: None  Social Needs  . Financial resource strain: None  . Food insecurity - worry: None  . Food insecurity - inability: None  . Transportation needs - medical: No  .  Transportation needs - non-medical: No  Occupational History  . None  Tobacco Use  . Smoking status: Current Every Day Smoker    Packs/day: 0.75    Years: 40.00    Pack years: 30.00    Types: Cigarettes  . Smokeless tobacco: Never Used  Substance and Sexual Activity  . Alcohol use: No  . Drug use: No  . Sexual activity: No  Other Topics Concern  . None  Social History Narrative  . None    Outpatient Encounter Medications as of 10/07/2017  Medication Sig  . ADVAIR DISKUS 100-50 MCG/DOSE AEPB USE 1 INHALATION BY MOUTH TWICE DAILY  . Cholecalciferol (VITAMIN D3) 1000 UNITS CAPS Take 1 capsule by mouth daily.  . clobetasol ointment (TEMOVATE) 8.84 % Apply 1 application  topically 2 (two) times daily.  . clotrimazole (LOTRIMIN) 1 % cream Apply 1 application topically 2 (two) times daily.  . cyanocobalamin 100 MCG tablet Take 100 mcg by mouth daily.    . fluticasone (FLONASE) 50 MCG/ACT nasal spray SHAKE LIQUID AND USE 2 SPRAYS IN EACH NOSTRIL DAILY  . loratadine (CLARITIN) 10 MG tablet Take 1 tablet (10 mg total) by mouth daily.  Marland Kitchen losartan (COZAAR) 50 MG tablet TAKE 1 TABLET(50 MG) BY MOUTH DAILY  . sertraline (ZOLOFT) 50 MG tablet Take 1 tablet (50 mg total) by mouth daily.  . simvastatin (ZOCOR) 20 MG tablet TAKE 1 TABLET BY MOUTH DAILY  . SYNTHROID 100 MCG tablet TAKE 1 TABLET BY MOUTH EVERY DAY  . traMADol (ULTRAM) 50 MG tablet Take 1 tablet (50 mg total) by mouth every 6 (six) hours as needed.  . triamcinolone cream (KENALOG) 0.1 % Apply 1 application topically 2 (two) times daily.  . Vitamin D, Ergocalciferol, (DRISDOL) 50000 units CAPS capsule Take by mouth.  . [DISCONTINUED] benzonatate (TESSALON) 100 MG capsule Take 1 capsule (100 mg total) by mouth 2 (two) times daily as needed for cough.  . [DISCONTINUED] hydrOXYzine (ATARAX/VISTARIL) 25 MG tablet Take 1 tablet (25 mg total) by mouth at bedtime as needed (insomnia). 30-60 minutes prior to bedtime for itching.  . [DISCONTINUED] Lactulose 20 GM/30ML SOLN 30 ml every 4 hours until constipation is relieved  . [DISCONTINUED] lidocaine (LIDODERM) 5 % Place 1 patch onto the skin daily. Remove & Discard patch within 12 hours.  . meloxicam (MOBIC) 15 MG tablet TAKE 1 TABLET(15 MG) BY MOUTH DAILY (Patient taking differently: TAKE 1 TABLET(15 MG) BY MOUTH DAILY (PRN))   No facility-administered encounter medications on file as of 10/07/2017.     Activities of Daily Living In your present state of health, do you have any difficulty performing the following activities: 10/07/2017  Hearing? N  Vision? N  Difficulty concentrating or making decisions? N  Walking or climbing stairs? N  Dressing or bathing? N    Doing errands, shopping? N  Preparing Food and eating ? N  Using the Toilet? N  In the past six months, have you accidently leaked urine? N  Do you have problems with loss of bowel control? N  Managing your Medications? N  Managing your Finances? N  Housekeeping or managing your Housekeeping? N  Some recent data might be hidden    Patient Care Team: Crecencio Mc, MD as PCP - General (Internal Medicine)    Assessment:   This is a routine wellness examination for Braley. The goal of the wellness visit is to assist the patient how to close the gaps in care and create a preventative care plan  for the patient.   The roster of all physicians providing medical care to patient is listed in the Snapshot section of the chart.  Taking calcium VIT D as appropriate/Osteoporosis risk reviewed.    Safety issues reviewed; Smoke and carbon monoxide detectors in the home. No firearms in the home.  Wears seatbelts when driving or riding with others. Patient does wear sunscreen or protective clothing when in direct sunlight. No violence in the home.  Depression- PHQ 2 &9 complete.  No signs/symptoms or verbal communication regarding little pleasure in doing things, feeling down, depressed or hopeless. No changes in sleeping, energy, eating, concentrating.  No thoughts of self harm or harm towards others.  Time spent on this topic is 10 minutes.   Patient is alert, normal appearance, oriented to person/place/and time. Correctly identified the president of the [place], recall of 3/3 words, and performing simple calculations. Displays appropriate judgement and can read correct time from watch face.   No new identified risk were noted.  No failures at ADL's or IADL's.    BMI- discussed the importance of a healthy diet, water intake and the benefits of aerobic exercise. Educational material provided.   24 hour diet recall: Breakfast: oatmeal, raisin toast, cream cheese Lunch: deli sandwich Dinner:  soup  Low carb foods encouraged.  Educational material provided.  Daily fluid intake:  cups of caffeine,  cups of water  Dental- every 12 months.  Dr. Lynelle Smoke.  Eye- Visual acuity not assessed per patient preference since they have regular follow up with the ophthalmologist.  Wears corrective lenses.  Sleep patterns- Sleeps 7 hours at night.  Wakes feeling rested.  Patient Concerns: None at this time. Follow up with PCP as needed.  Exercise Activities and Dietary recommendations    Goals    . Patient centered goal     Stay hydrated and drink plenty of water    . Reduce sugar intake     Reduce portion size of sweets (Candy, cakes, cookies, pies etc...) Low carb foods. Lean meats, vegetables.        Fall Risk Fall Risk  10/07/2017 04/22/2017 10/03/2016 06/18/2016 10/09/2015  Falls in the past year? No No No No Yes  Number falls in past yr: - - - - 1  Injury with Fall? - - - - No  Follow up - - - - Education provided;Falls prevention discussed   Depression Screen PHQ 2/9 Scores 10/07/2017 04/22/2017 10/03/2016 06/18/2016  PHQ - 2 Score 0 0 0 0  PHQ- 9 Score 0 - - -     Cognitive Function MMSE - Mini Mental State Exam 10/09/2015  Orientation to time 5  Orientation to Place 5  Registration 3  Attention/ Calculation 5  Recall 3  Language- name 2 objects 2  Language- repeat 1  Language- follow 3 step command 3  Language- read & follow direction 1  Write a sentence 1  Copy design 1  Total score 30     6CIT Screen 10/07/2017 10/03/2016  What Year? 0 points 0 points  What month? 0 points 0 points  What time? 0 points 0 points  Count back from 20 0 points 0 points  Months in reverse 0 points 0 points  Repeat phrase 0 points -  Total Score 0 -    Immunization History  Administered Date(s) Administered  . Influenza Split 07/05/2012, 07/27/2013, 06/29/2014  . Influenza, High Dose Seasonal PF 06/10/2017  . Influenza,inj,Quad PF,6+ Mos 06/04/2016  .  Influenza-Unspecified 07/20/2015  .  Pneumococcal Conjugate-13 04/06/2014  . Pneumococcal Polysaccharide-23 07/05/2012  . Tdap 10/16/2011  . Zoster 12/19/2011    Screening Tests Health Maintenance  Topic Date Due  . OPHTHALMOLOGY EXAM  09/08/2017  . FOOT EXAM  11/03/2017  . HEMOGLOBIN A1C  11/07/2017  . COLONOSCOPY  05/21/2020  . TETANUS/TDAP  10/15/2021  . INFLUENZA VACCINE  Completed  . DEXA SCAN  Completed  . PNA vac Low Risk Adult  Completed      Plan:    End of life planning; Advance aging; Advanced directives discussed. Copy of current HCPOA/Living Will requested.    I have personally reviewed and noted the following in the patient's chart:   . Medical and social history . Use of alcohol, tobacco or illicit drugs  . Current medications and supplements . Functional ability and status . Nutritional status . Physical activity . Advanced directives . List of other physicians . Hospitalizations, surgeries, and ER visits in previous 12 months . Vitals . Screenings to include cognitive, depression, and falls . Referrals and appointments  In addition, I have reviewed and discussed with patient certain preventive protocols, quality metrics, and best practice recommendations. A written personalized care plan for preventive services as well as general preventive health recommendations were provided to patient.     Varney Biles, LPN  [id number]

## 2017-10-07 NOTE — Patient Instructions (Addendum)
  Ms. Roehrs , Thank you for taking time to come for your Medicare Wellness Visit. I appreciate your ongoing commitment to your health goals. Please review the following plan we discussed and let me know if I can assist you in the future.   Follow up with Dr. Derrel Nip as needed.    Bring a copy of your [place] and/or Living Will to be scanned into chart.  Have a great day!  These are the goals we discussed: Goals    . Patient centered goal     Stay hydrated and drink plenty of water    . Reduce sugar intake     Reduce portion size of sweets (Candy, cakes, cookies, pies etc...) Low carb foods. Lean meats, vegetables.        This is a list of the screening recommended for you and due dates:  Health Maintenance  Topic Date Due  . Eye exam for diabetics  09/08/2017  . Complete foot exam   11/03/2017  . Hemoglobin A1C  11/07/2017  . Colon Cancer Screening  05/21/2020  . Tetanus Vaccine  10/15/2021  . Flu Shot  Completed  . DEXA scan (bone density measurement)  Completed  . Pneumonia vaccines  Completed

## 2017-10-07 NOTE — Progress Notes (Signed)
I have reviewed the above note and agree.  Talbert Trembath, M.D.  

## 2017-10-16 ENCOUNTER — Ambulatory Visit
Admission: RE | Admit: 2017-10-16 | Discharge: 2017-10-16 | Disposition: A | Discharge status: Home / Self Care | Payer: PPO | Source: Ambulatory Visit | Attending: Internal Medicine | Admitting: Internal Medicine

## 2017-10-16 DIAGNOSIS — Z1231 Encounter for screening mammogram for malignant neoplasm of breast: Secondary | ICD-10-CM

## 2017-10-22 DIAGNOSIS — H524 Presbyopia: Secondary | ICD-10-CM | POA: Diagnosis not present

## 2017-10-22 DIAGNOSIS — H52223 Regular astigmatism, bilateral: Secondary | ICD-10-CM | POA: Diagnosis not present

## 2017-10-22 DIAGNOSIS — I10 Essential (primary) hypertension: Secondary | ICD-10-CM | POA: Diagnosis not present

## 2017-10-22 DIAGNOSIS — H5203 Hypermetropia, bilateral: Secondary | ICD-10-CM | POA: Diagnosis not present

## 2017-10-22 DIAGNOSIS — Z961 Presence of intraocular lens: Secondary | ICD-10-CM | POA: Diagnosis not present

## 2017-10-22 DIAGNOSIS — E7800 Pure hypercholesterolemia, unspecified: Secondary | ICD-10-CM | POA: Diagnosis not present

## 2017-10-22 LAB — HM DIABETES EYE EXAM

## 2017-11-06 DIAGNOSIS — F3342 Major depressive disorder, recurrent, in full remission: Secondary | ICD-10-CM | POA: Diagnosis not present

## 2017-11-10 ENCOUNTER — Other Ambulatory Visit: Payer: Self-pay | Admitting: Internal Medicine

## 2017-11-12 ENCOUNTER — Encounter: Payer: Self-pay | Admitting: Internal Medicine

## 2017-11-18 ENCOUNTER — Ambulatory Visit (INDEPENDENT_AMBULATORY_CARE_PROVIDER_SITE_OTHER): Payer: PPO | Admitting: Internal Medicine

## 2017-11-18 ENCOUNTER — Encounter: Payer: Self-pay | Admitting: Internal Medicine

## 2017-11-18 ENCOUNTER — Ambulatory Visit (INDEPENDENT_AMBULATORY_CARE_PROVIDER_SITE_OTHER): Payer: PPO

## 2017-11-18 ENCOUNTER — Ambulatory Visit: Payer: PPO

## 2017-11-18 VITALS — BP 140/66 | HR 79 | Temp 97.8°F | Resp 15 | Ht 64.5 in | Wt 148.2 lb

## 2017-11-18 DIAGNOSIS — Z720 Tobacco use: Secondary | ICD-10-CM

## 2017-11-18 DIAGNOSIS — Z23 Encounter for immunization: Secondary | ICD-10-CM

## 2017-11-18 DIAGNOSIS — E7800 Pure hypercholesterolemia, unspecified: Secondary | ICD-10-CM

## 2017-11-18 DIAGNOSIS — I10 Essential (primary) hypertension: Secondary | ICD-10-CM | POA: Diagnosis not present

## 2017-11-18 DIAGNOSIS — R059 Cough, unspecified: Secondary | ICD-10-CM

## 2017-11-18 DIAGNOSIS — J410 Simple chronic bronchitis: Secondary | ICD-10-CM | POA: Diagnosis not present

## 2017-11-18 DIAGNOSIS — R05 Cough: Secondary | ICD-10-CM

## 2017-11-18 DIAGNOSIS — J3489 Other specified disorders of nose and nasal sinuses: Secondary | ICD-10-CM

## 2017-11-18 DIAGNOSIS — E119 Type 2 diabetes mellitus without complications: Secondary | ICD-10-CM

## 2017-11-18 DIAGNOSIS — E7801 Familial hypercholesterolemia: Secondary | ICD-10-CM | POA: Diagnosis not present

## 2017-11-18 LAB — COMPREHENSIVE METABOLIC PANEL
ALBUMIN: 3.9 g/dL (ref 3.5–5.2)
ALT: 12 U/L (ref 0–35)
AST: 17 U/L (ref 0–37)
Alkaline Phosphatase: 61 U/L (ref 39–117)
BUN: 9 mg/dL (ref 6–23)
CALCIUM: 9.1 mg/dL (ref 8.4–10.5)
CO2: 29 mEq/L (ref 19–32)
Chloride: 100 mEq/L (ref 96–112)
Creatinine, Ser: 0.67 mg/dL (ref 0.40–1.20)
GFR: 91.17 mL/min (ref >60.00)
Glucose, Bld: 109 mg/dL — ABNORMAL HIGH (ref 70–99)
POTASSIUM: 4.4 meq/L (ref 3.5–5.1)
Sodium: 132 mEq/L — ABNORMAL LOW (ref 135–145)
TOTAL PROTEIN: 7 g/dL (ref 6.0–8.3)
Total Bilirubin: 0.5 mg/dL (ref 0.2–1.2)

## 2017-11-18 LAB — CBC WITH DIFFERENTIAL/PLATELET
Basophils Absolute: 0 10*3/uL (ref 0.0–0.1)
Basophils Relative: 0.4 % (ref 0.0–3.0)
EOS ABS: 0.1 10*3/uL (ref 0.0–0.7)
Eosinophils Relative: 1.2 % (ref 0.0–5.0)
HCT: 38.1 % (ref 36.0–46.0)
HEMOGLOBIN: 13 g/dL (ref 12.0–15.0)
Lymphocytes Relative: 14.2 % (ref 12.0–46.0)
Lymphs Abs: 1.2 10*3/uL (ref 0.7–4.0)
MCHC: 34.1 g/dL (ref 30.0–36.0)
MCV: 92.3 fl (ref 78.0–100.0)
Monocytes Absolute: 0.6 10*3/uL (ref 0.1–1.0)
Monocytes Relative: 7.2 % (ref 3.0–12.0)
Neutro Abs: 6.5 10*3/uL (ref 1.4–7.7)
Neutrophils Relative %: 77 % (ref 43.0–77.0)
Platelets: 253 10*3/uL (ref 150.0–400.0)
RBC: 4.13 Mil/uL (ref 3.87–5.11)
RDW: 13.4 % (ref 11.5–15.5)
WBC: 8.4 10*3/uL (ref 4.0–10.5)

## 2017-11-18 LAB — HEMOGLOBIN A1C: Hgb A1c MFr Bld: 5.9 % (ref 4.6–6.5)

## 2017-11-18 LAB — LIPID PANEL
CHOLESTEROL: 138 mg/dL (ref 0–200)
HDL: 54.2 mg/dL (ref >39.00)
LDL Cholesterol: 70 mg/dL (ref 0–99)
NonHDL: 83.35
TRIGLYCERIDES: 67 mg/dL (ref 0.0–149.0)
Total CHOL/HDL Ratio: 3
VLDL: 13.4 mg/dL (ref 0.0–40.0)

## 2017-11-18 LAB — MICROALBUMIN / CREATININE URINE RATIO
Creatinine,U: 43.2 mg/dL
MICROALB/CREAT RATIO: 1.6 mg/g (ref 0.0–30.0)

## 2017-11-18 NOTE — Progress Notes (Signed)
Subjective:  Patient ID: Jane Davidson, female    DOB: [birth year]  Age: 77 y.o. MRN: [id number]  CC: The primary encounter diagnosis was Cough. Diagnoses of Familial hypercholesterolemia, Pure hypercholesterolemia, Essential hypertension, Diabetes mellitus without complication ([place]), Need for 23-polyvalent pneumococcal polysaccharide vaccine, Rhinorrhea, Simple chronic bronchitis ([place]), and Tobacco abuse disorder were also pertinent to this visit.  HPI Jane Davidson presents for 6 month follow up on diabetes.  Patient has no complaints today.  Patient is following a low glycemic index diet and taking all prescribed medications regularly without side effects.  Fasting sugars have been under less than 140 most of the time and post prandials have been under 160 except on rare occasions. Patient is exercising about 3 times per week and intentionally trying to lose weight .  Patient has had an eye exam in the last 12 months and checks feet regularly for signs of infection.  Patient does not walk barefoot outside,  And denies an numbness tingling or burning in feet. Patient is up to date on all recommended vaccinations   Has had an intermittent cough FOR THE PAST 3-4 months . Started in November,   Treated at [facility] for COPD with doxycycline . No chest x ray done .   Then in December  Treated by [facility] for runny nose .  Still runny,  Sneezing  In spells  No fevers. Sinuses not congested.  Just rhinitis and drainage is clear.  Cough has not resolved,  Still taking delsym . No pleurisy, fevers.  . Lab Results  Component Value Date   HGBA1C 5.9 11/18/2017    Outpatient Medications Prior to Visit  Medication Sig Dispense Refill  . ADVAIR DISKUS 100-50 MCG/DOSE AEPB USE 1 INHALATION BY MOUTH TWICE DAILY 14 each 3  . Cholecalciferol (VITAMIN D3) 1000 UNITS CAPS Take 1 capsule by mouth daily.    . clobetasol ointment (TEMOVATE) 2.99 % Apply 1 application topically 2 (two) times daily. 30 g 5  . clotrimazole  (LOTRIMIN) 1 % cream Apply 1 application topically 2 (two) times daily. 30 g 1  . cyanocobalamin 100 MCG tablet Take 100 mcg by mouth daily.      . fluticasone (FLONASE) 50 MCG/ACT nasal spray SHAKE LIQUID AND USE 2 SPRAYS IN EACH NOSTRIL DAILY 16 g 2  . loratadine (CLARITIN) 10 MG tablet Take 1 tablet (10 mg total) by mouth daily. 30 tablet 11  . losartan (COZAAR) 50 MG tablet TAKE 1 TABLET(50 MG) BY MOUTH DAILY 90 tablet 0  . meloxicam (MOBIC) 15 MG tablet TAKE 1 TABLET(15 MG) BY MOUTH DAILY (Patient taking differently: TAKE 1 TABLET(15 MG) BY MOUTH DAILY (PRN)) 30 tablet 0  . sertraline (ZOLOFT) 50 MG tablet Take 1 tablet (50 mg total) by mouth daily. 90 tablet 1  . simvastatin (ZOCOR) 20 MG tablet TAKE 1 TABLET BY MOUTH DAILY 90 tablet 1  . SYNTHROID 100 MCG tablet TAKE 1 TABLET BY MOUTH EVERY DAY 90 tablet 3  . traMADol (ULTRAM) 50 MG tablet Take 1 tablet (50 mg total) by mouth every 6 (six) hours as needed. 120 tablet 0  . triamcinolone cream (KENALOG) 0.1 % Apply 1 application topically 2 (two) times daily. 30 g 5  . Vitamin D, Ergocalciferol, (DRISDOL) 50000 units CAPS capsule Take by mouth.     No facility-administered medications prior to visit.     Review of Systems;  Patient denies headache, fevers, malaise, unintentional weight loss, skin rash, eye pain, sinus congestion and sinus  pain, sore throat, dysphagia,  hemoptysis ,dyspnea, wheezing, chest pain, palpitations, orthopnea, edema, abdominal pain, nausea, melena, diarrhea, constipation, flank pain, dysuria, hematuria, urinary  Frequency, nocturia, numbness, tingling, seizures,  Focal weakness, Loss of consciousness,  Tremor, insomnia, depression, anxiety, and suicidal ideation.      Objective:  BP 140/66 (BP Location: Left Arm, Patient Position: Sitting, Cuff Size: Normal)   Pulse 79   Temp 97.8 F (36.6 C) (Oral)   Resp 15   Ht 5' 4.5" (1.638 m)   Wt 148 lb 3.2 oz (67.2 kg)   SpO2 94%   BMI 25.05 kg/m   BP Readings  from Last 3 Encounters:  11/18/17 140/66  10/07/17 118/64  09/25/17 116/60    Wt Readings from Last 3 Encounters:  11/18/17 148 lb 3.2 oz (67.2 kg)  10/07/17 147 lb 12.8 oz (67 kg)  09/25/17 150 lb (68 kg)    General appearance: alert, cooperative and appears stated age Ears: normal TM's and external ear canals both ears Throat: lips, mucosa, and tongue normal; teeth and gums normal Neck: no adenopathy, no carotid bruit, supple, symmetrical, trachea midline and thyroid not enlarged, symmetric, no tenderness/mass/nodules Back: symmetric, no curvature. ROM normal. No CVA tenderness. Lungs: clear to auscultation bilaterally Heart: regular rate and rhythm, S1, S2 normal, no murmur, click, rub or gallop Abdomen: soft, non-tender; bowel sounds normal; no masses,  no organomegaly Pulses: 2+ and symmetric Skin: Skin color, texture, turgor normal. No rashes or lesions Lymph nodes: Cervical, supraclavicular, and axillary nodes normal.  Lab Results  Component Value Date   HGBA1C 5.9 11/18/2017   HGBA1C 5.8 05/07/2017   HGBA1C 5.9 10/02/2016    Lab Results  Component Value Date   CREATININE 0.67 11/18/2017   CREATININE 0.77 05/07/2017   CREATININE 0.72 10/02/2016    Lab Results  Component Value Date   WBC 8.4 11/18/2017   HGB 13.0 11/18/2017   HCT 38.1 11/18/2017   PLT 253.0 11/18/2017   GLUCOSE 109 (H) 11/18/2017   CHOL 138 11/18/2017   TRIG 67.0 11/18/2017   HDL 54.20 11/18/2017   LDLDIRECT 69.0 05/07/2017   LDLCALC 70 11/18/2017   ALT 12 11/18/2017   AST 17 11/18/2017   NA 132 (L) 11/18/2017   K 4.4 11/18/2017   CL 100 11/18/2017   CREATININE 0.67 11/18/2017   BUN 9 11/18/2017   CO2 29 11/18/2017   TSH 0.96 05/07/2017   INR 1.0 11/03/2014   HGBA1C 5.9 11/18/2017   MICROALBUR <0.7 11/18/2017    Mm Digital Screening Bilateral  Result Date: 10/16/2017 CLINICAL DATA:  Screening. EXAM: DIGITAL SCREENING BILATERAL MAMMOGRAM WITH CAD COMPARISON:  Previous exam(s). ACR  Breast Density Category c: The breast tissue is heterogeneously dense, which may obscure small masses. FINDINGS: There are no findings suspicious for malignancy. Images were processed with CAD. IMPRESSION: No mammographic evidence of malignancy. A result letter of this screening mammogram will be mailed directly to the patient. RECOMMENDATION: Screening mammogram in one year. (Code:SM-B-01Y) BI-RADS CATEGORY  1: Negative. Electronically Signed   By: Ammie Ferrier M.D.   On: 10/16/2017 10:50    Assessment & Plan:   Problem List Items Addressed This Visit    Tobacco abuse disorder    Risks of continued tobacco use were discussed. She is not currently interested in tobacco cessation.       Rhinorrhea    No evidenceof sinusitis on exam .  Trial of benadryl at night,  2nd gen antihistamine during day  Hyperlipidemia   Essential hypertension    she reports compliance with medication regimen  but has an elevated reading today in office.  He has been asked to check his BP at work and  submit readings for evaluation. Renal function will be checked today.  Lab Results  Component Value Date   CREATININE 0.67 11/18/2017   Lab Results  Component Value Date   MICROALBUR <0.7 11/18/2017         Diabetes mellitus without complication ([place])     well-controlled on diet alone.  hemoglobin A1c has been consistently at or  less than 7.0 checked every 6 months  . Patient is up-to-date on eye exams and foot exam is normal today. Patient has no proteinuria and is tolerating statin therapy for CAD risk reduction.  BP has been elevated on the last several visits,  Given her ongoing tobacco abuse, will recommend continued attempts at smoking cessation,  Continued use of daily aspirin  81 mg and 50 mg losartan .  Lab Results  Component Value Date   HGBA1C 5.9 11/18/2017   Lab Results  Component Value Date   MICROALBUR <0.7 11/18/2017         Relevant Orders   Lipid panel (Completed)    Hemoglobin A1c (Completed)   Comprehensive metabolic panel (Completed)   Microalbumin / creatinine urine ratio (Completed)   CBC with Differential/Platelet (Completed)   COPD (chronic obstructive pulmonary disease) ([place])    With recent lower respiratory infection treated in November with doxycycline without resolution of cough.  Chest x ray done today to rule out mass,  Pneumonia. . She is not wheezing.  Will avoid steroids, start  antihistamines and continue cough suppression        Other Visit Diagnoses    Cough    -  Primary   Relevant Orders   DG Chest 2 View (Completed)   CBC with Differential/Platelet (Completed)   Need for 23-polyvalent pneumococcal polysaccharide vaccine       Relevant Orders   Pneumococcal polysaccharide vaccine 23-valent greater than or equal to 2yo subcutaneous/IM (Completed)    A total of 25 minutes of face to face time was spent with patient more than half of which was spent in counselling about the above mentioned conditions  and coordination of care   I am having Jane Davidson maintain her cyanocobalamin, Vitamin D3, clobetasol ointment, SYNTHROID, ADVAIR DISKUS, traMADol, sertraline, triamcinolone cream, clotrimazole, fluticasone, meloxicam, Vitamin D (Ergocalciferol), simvastatin, loratadine, and losartan.  No orders of the defined types were placed in this encounter.   There are no discontinued medications.  Follow-up: Return in about 6 months (around 05/18/2018) for DIABETES FOLLOW UP .   Crecencio Mc, MD

## 2017-11-18 NOTE — Patient Instructions (Addendum)
Choose one of these antihistamines to take daily    Allegra is available generically as fexofenadine and it comes in 60 mg and 180 mg once daily strengths.   The claritin is also available generically as loratidine  10 mg daily     If ches t x ray shows bronchitis,  I will add a prednisone taper and another round of antibiotics.     wHENEVER YOU TAKE AN ANTIBIOTIC.  Please take a probiotic ( Align, Floraque or Culturelle), the generic version of one of these over the counter medications, or  a daily serving of Yogurt, or another dietary source) for a minimum of 3 weeks to prevent a serious antibiotic associated diarrhea  Called clostridium dificile colitis.  Taking a probiotic may also prevent vaginitis due to yeast infections and can be continued indefinitely if you feel that it improves your digestion or your elimination (bowels).  Your EKG was normal in Jan 2016  .  I  HAVE MADE A COPY FOR YOU TO TAKE TO YOUR PSYCHIATRIST

## 2017-11-19 ENCOUNTER — Encounter: Payer: Self-pay | Admitting: Internal Medicine

## 2017-11-21 ENCOUNTER — Encounter: Payer: Self-pay | Admitting: Internal Medicine

## 2017-11-21 NOTE — Assessment & Plan Note (Signed)
she reports compliance with medication regimen  but has an elevated reading today in office.  He has been asked to check his BP at work and  submit readings for evaluation. Renal function will be checked today.  Lab Results  Component Value Date   CREATININE 0.67 11/18/2017   Lab Results  Component Value Date   MICROALBUR <0.7 11/18/2017

## 2017-11-21 NOTE — Assessment & Plan Note (Signed)
No evidenceof sinusitis on exam .  Trial of benadryl at night,  2nd gen antihistamine during day

## 2017-11-21 NOTE — Assessment & Plan Note (Signed)
With recent lower respiratory infection treated in November with doxycycline without resolution of cough.  Chest x ray done today to rule out mass,  Pneumonia. . She is not wheezing.  Will avoid steroids, start  antihistamines and continue cough suppression

## 2017-11-21 NOTE — Assessment & Plan Note (Addendum)
well-controlled on diet alone.  hemoglobin A1c has been consistently at or  less than 7.0 checked every 6 months  . Patient is up-to-date on eye exams and foot exam is normal today. Patient has no proteinuria and is tolerating statin therapy for CAD risk reduction.  BP has been elevated on the last several visits,  Given her ongoing tobacco abuse, will recommend continued attempts at smoking cessation,  Continued use of daily aspirin  81 mg and 50 mg losartan .  Lab Results  Component Value Date   HGBA1C 5.9 11/18/2017   Lab Results  Component Value Date   MICROALBUR <0.7 11/18/2017

## 2017-11-21 NOTE — Assessment & Plan Note (Signed)
Risks of continued tobacco use were discussed. She is not currently interested in tobacco cessation.    

## 2017-11-23 ENCOUNTER — Other Ambulatory Visit: Payer: Self-pay | Admitting: Internal Medicine

## 2017-12-09 ENCOUNTER — Ambulatory Visit: Payer: PPO

## 2017-12-15 ENCOUNTER — Ambulatory Visit (INDEPENDENT_AMBULATORY_CARE_PROVIDER_SITE_OTHER): Payer: PPO

## 2017-12-15 VITALS — BP 144/68 | HR 77

## 2017-12-15 DIAGNOSIS — I10 Essential (primary) hypertension: Secondary | ICD-10-CM | POA: Diagnosis not present

## 2017-12-15 NOTE — Progress Notes (Signed)
Patient comes in for 2 week blood pressure check.  She states she has been taking blood pressure. Average blood pressure readings at home are 146-150/66.  She didn't bring home blood pressure machine in today.  She  will bring by home blood pressure readings for you to review.     I have reviewed the above information .  Please direct patient to increase losartan dose to 100 mg ad return in one week for BP check.   [name], MD

## 2017-12-16 MED ORDER — LOSARTAN POTASSIUM 100 MG PO TABS: 100.0000 mg | ORAL_TABLET | Freq: Every day | ORAL | 0 refills | Status: DC

## 2017-12-16 NOTE — Progress Notes (Signed)
Needs to increase losartan dose to 100 mg daily and rtc in one week .  New rx sent to pharmacy

## 2017-12-18 ENCOUNTER — Other Ambulatory Visit: Payer: Self-pay | Admitting: Internal Medicine

## 2017-12-22 ENCOUNTER — Ambulatory Visit: Payer: PPO

## 2017-12-24 NOTE — Progress Notes (Signed)
Patient advised appointment scheduled

## 2017-12-29 ENCOUNTER — Ambulatory Visit: Payer: PPO

## 2017-12-31 ENCOUNTER — Ambulatory Visit (INDEPENDENT_AMBULATORY_CARE_PROVIDER_SITE_OTHER): Payer: PPO | Admitting: *Deleted

## 2017-12-31 VITALS — BP 142/70 | HR 79 | Resp 18

## 2017-12-31 DIAGNOSIS — I10 Essential (primary) hypertension: Secondary | ICD-10-CM | POA: Diagnosis not present

## 2018-01-01 NOTE — Progress Notes (Addendum)
One week return after increasing losartan to 100 mg daily  BP taken in left arm 144/70 pulse 66, after waiting additional 15 minutes BP taken in right arm 142/70 pulse 79.   Recommending that patient add hctz 12.5 mg daily to losartan for goal blood pressure of 130/80 or less

## 2018-01-03 MED ORDER — HYDROCHLOROTHIAZIDE 12.5 MG PO TABS: 12.5000 mg | ORAL_TABLET | Freq: Every day | ORAL | 0 refills | Status: DC

## 2018-01-03 NOTE — Addendum Note (Signed)
Addended by: Crecencio Mc on: 01/03/2018 09:42 PM   Modules accepted: Orders

## 2018-01-07 ENCOUNTER — Other Ambulatory Visit: Payer: Self-pay | Admitting: Internal Medicine

## 2018-01-07 NOTE — Progress Notes (Signed)
Patient notified and voiced understanding.

## 2018-02-11 ENCOUNTER — Other Ambulatory Visit: Payer: Self-pay | Admitting: Internal Medicine

## 2018-02-14 ENCOUNTER — Other Ambulatory Visit: Payer: Self-pay | Admitting: Internal Medicine

## 2018-02-23 DIAGNOSIS — L820 Inflamed seborrheic keratosis: Secondary | ICD-10-CM | POA: Diagnosis not present

## 2018-02-25 ENCOUNTER — Other Ambulatory Visit: Payer: Self-pay | Admitting: Internal Medicine

## 2018-02-25 MED ORDER — TRAMADOL HCL 50 MG PO TABS: 50.0000 mg | ORAL_TABLET | Freq: Four times a day (QID) | ORAL | 0 refills | Status: DC | PRN

## 2018-02-25 NOTE — Telephone Encounter (Signed)
It looks like Dr. Derrel Nip prescribed this last year. Please advise.

## 2018-02-25 NOTE — Telephone Encounter (Signed)
30 day refill authorized.

## 2018-02-25 NOTE — Telephone Encounter (Signed)
Refilled: 03/31/2017 Last OV: 11/18/2017 Next OV: not scheduled

## 2018-02-25 NOTE — Telephone Encounter (Signed)
Copied from [place] [id number]. Topic: Quick Communication - Rx Refill/Question >> Feb 25, 2018 10:49 AM Margot Ables wrote: Medication: tramadol - pain in upper R leg into hip - she said she's had this problem before. Please advise. Has the patient contacted their pharmacy? No Preferred Pharmacy (with phone number or street name): [facility] [place], [place] - [place] [phone] (Phone) [phone] (Fax)

## 2018-02-26 ENCOUNTER — Telehealth: Payer: Self-pay

## 2018-02-26 NOTE — Telephone Encounter (Signed)
Copied from [place] [id number]. Topic: Inquiry >> Feb 26, 2018 12:37 PM Pricilla Handler wrote: Reason for CRM: Patient called to check on the status of her refill. Patient states that she called yesterday regarding a refill of TraMADol (ULTRAM) 50 MG tablet. Patient is going out of town and needs the medication today. Please call patient at [phone].       Thank You!!!  Called and left voicemail for patient that Dr. Derrel Nip sent in Tramadol and to call the office if she has any further questions or concerns.

## 2018-02-26 NOTE — Telephone Encounter (Signed)
Pt was told that pharm did not get rx. Is asking for tramadol to be called back in.  cb is [phone]

## 2018-02-26 NOTE — Telephone Encounter (Signed)
Rx has been called in for Tr[facility]adol to [facility] on 02-26-18.

## 2018-03-01 ENCOUNTER — Ambulatory Visit (INDEPENDENT_AMBULATORY_CARE_PROVIDER_SITE_OTHER): Payer: PPO

## 2018-03-01 ENCOUNTER — Encounter: Payer: Self-pay | Admitting: Internal Medicine

## 2018-03-01 ENCOUNTER — Ambulatory Visit (INDEPENDENT_AMBULATORY_CARE_PROVIDER_SITE_OTHER): Payer: PPO | Admitting: Internal Medicine

## 2018-03-01 VITALS — BP 134/64 | HR 79 | Temp 98.3°F | Ht 64.5 in | Wt 146.2 lb

## 2018-03-01 DIAGNOSIS — M25551 Pain in right hip: Secondary | ICD-10-CM

## 2018-03-01 DIAGNOSIS — M4807 Spinal stenosis, lumbosacral region: Secondary | ICD-10-CM | POA: Diagnosis not present

## 2018-03-01 DIAGNOSIS — M5126 Other intervertebral disc displacement, lumbar region: Secondary | ICD-10-CM

## 2018-03-01 DIAGNOSIS — M479 Spondylosis, unspecified: Secondary | ICD-10-CM | POA: Insufficient documentation

## 2018-03-01 DIAGNOSIS — M545 Low back pain, unspecified: Secondary | ICD-10-CM | POA: Insufficient documentation

## 2018-03-01 DIAGNOSIS — R011 Cardiac murmur, unspecified: Secondary | ICD-10-CM

## 2018-03-01 DIAGNOSIS — G8929 Other chronic pain: Secondary | ICD-10-CM | POA: Diagnosis not present

## 2018-03-01 DIAGNOSIS — R110 Nausea: Secondary | ICD-10-CM

## 2018-03-01 MED ORDER — MELOXICAM 15 MG PO TABS: ORAL_TABLET | ORAL | 0 refills | Status: DC

## 2018-03-01 NOTE — Progress Notes (Signed)
Pre visit review using our clinic review tool, if applicable. No additional management support is needed unless otherwise documented below in the visit note. 

## 2018-03-01 NOTE — Progress Notes (Signed)
Chief Complaint  Patient presents with  . Acute Visit    Right leg px   F/u  1. 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 She did PT last year she reports tramadol has helped would like Rx refill MObic and she feels off balance in the am   2. C/o nausea new. Reviewed hctz this can be side effect typically >25 mg doses and also she has been on zoloft also reviewed this could be side effects of pain meds other etiology could be reflux if continues will defer furhter w/u to PCP   Review of Systems  Respiratory: Negative for shortness of breath.   Cardiovascular: Negative for chest pain.  Gastrointestinal: Positive for nausea.  Musculoskeletal: Positive for back pain and joint pain.   Past Medical History:  Diagnosis Date  . Abnormal Pap smear of cervix 2001   Biopsy normal  . Bursitis NEC   . COPD (chronic obstructive pulmonary disease) ([place])   . History of peripheral vascular disease 2001   s/p bilateral aorto-iliac-femoral bypass /[facility]  . Hyperlipidemia   . hypothyroid   . Osteopenia 2009    T scores - 1.5 DEXA 2009  . Screening for breast cancer May 2012   [facility] mammogram  . Tobacco abuse disorder   . Vitamin D deficiency April 2011   replaced withDrsido for level of 11.3 ng/ml  . Vitamin D deficiency April 2011   replaced withDrsido for level of 11.3 ng/ml   Past Surgical History:  Procedure Laterality Date  . BREAST BIOPSY Left    benign  . CATARACT EXTRACTION  Sept 2011  . CHOLECYSTECTOMY    . HERNIA REPAIR  Jun 2011   [name]   . TUBAL LIGATION     Family History  Problem Relation Age of Onset  . Hypertension Mother   . Arthritis Mother   . Diabetes Father    Social History   Socioeconomic History  . Marital status: Widowed    Spouse name: Not on file  . Number of children: Not on file   . Years of education: Not on file  . Highest education level: Not on file  Occupational History  . Not on file  Social Needs  . Financial resource strain: Not on file  . Food insecurity:    Worry: Not on file    Inability: Not on file  . Transportation needs:    Medical: No    Non-medical: No  Tobacco Use  . Smoking status: Current Every Day Smoker    Packs/day: 0.75    Years: 40.00    Pack years: 30.00    Types: Cigarettes  . Smokeless tobacco: Never Used  Substance and Sexual Activity  . Alcohol use: No  . Drug use: No  . Sexual activity: Never  Lifestyle  . Physical activity:    Days per week: Not on file    Minutes per session: Not on file  . Stress: Not on file  Relationships  . Social connections:    Talks on phone: Not on file    Gets together: Not on file    Attends religious service: Not on file    Active member of club or organization: Not on file    Attends meetings of clubs or organizations: Not on file    Relationship status: Not on file  .  Intimate partner violence:    Fear of current or ex partner: Not on file    Emotionally abused: Not on file    Physically abused: Not on file    Forced sexual activity: Not on file  Other Topics Concern  . Not on file  Social History Narrative  . Not on file   Current Meds  Medication Sig  . ADVAIR DISKUS 100-50 MCG/DOSE AEPB INHALE 1 PUFF BY MOUTH TWICE DAILY  . Cholecalciferol (VITAMIN D3) 1000 UNITS CAPS Take 1 capsule by mouth daily.  . clobetasol ointment (TEMOVATE) 5.57 % Apply 1 application topically 2 (two) times daily.  . clotrimazole (LOTRIMIN) 1 % cream Apply 1 application topically 2 (two) times daily.  . cyanocobalamin 100 MCG tablet Take 100 mcg by mouth daily.    . fluticasone (FLONASE) 50 MCG/ACT nasal spray SHAKE LIQUID AND USE 2 SPRAYS IN EACH NOSTRIL DAILY  . hydrochlorothiazide (HYDRODIURIL) 12.5 MG tablet Take 1 tablet (12.5 mg total) by mouth daily.  . Influenza vac split quadrivalent PF  (FLUZONE HIGH-DOSE) 0.5 ML injection Fluzone High-Dose 2016-2017 (PF) 180 mcg/0.5 mL intramuscular syringe  ADM 0.5ML IM UTD  . loratadine (CLARITIN) 10 MG tablet Take 1 tablet (10 mg total) by mouth daily.  Marland Kitchen losartan (COZAAR) 100 MG tablet Take 1 tablet (100 mg total) by mouth daily.  . meloxicam (MOBIC) 15 MG tablet TAKE 1 TABLET(15 MG) BY MOUTH DAILY with food  . sertraline (ZOLOFT) 50 MG tablet Take 1 tablet (50 mg total) by mouth daily.  . simvastatin (ZOCOR) 20 MG tablet TAKE 1 TABLET BY MOUTH DAILY  . SYNTHROID 100 MCG tablet TAKE 1 TABLET BY MOUTH EVERY DAY  . traMADol (ULTRAM) 50 MG tablet Take 1 tablet (50 mg total) by mouth every 6 (six) hours as needed.  . triamcinolone cream (KENALOG) 0.1 % Apply 1 application topically 2 (two) times daily.  . Vitamin D, Ergocalciferol, (DRISDOL) 50000 units CAPS capsule Take by mouth.  . [DISCONTINUED] meloxicam (MOBIC) 15 MG tablet TAKE 1 TABLET(15 MG) BY MOUTH DAILY (Patient taking differently: TAKE 1 TABLET(15 MG) BY MOUTH DAILY (PRN))   Allergies  Allergen Reactions  . Pollen Extract    No results found for this or any previous visit (from the past 2160 hour(s)). Objective  Body mass index is 24.71 kg/m. Wt Readings from Last 3 Encounters:  03/01/18 146 lb 3.2 oz (66.3 kg)  11/18/17 148 lb 3.2 oz (67.2 kg)  10/07/17 147 lb 12.8 oz (67 kg)   Temp Readings from Last 3 Encounters:  03/01/18 98.3 F (36.8 C) (Oral)  11/18/17 97.8 F (36.6 C) (Oral)  10/07/17 98 F (36.7 C) (Oral)   BP Readings from Last 3 Encounters:  03/01/18 134/64  12/31/17 (!) 142/70  12/15/17 (!) 144/68   Pulse Readings from Last 3 Encounters:  03/01/18 79  12/31/17 79  12/15/17 77    Physical Exam  Constitutional: She is oriented to person, place, and time. Vital signs are normal. She appears well-developed and well-nourished. She is cooperative.  HENT:  Head: Normocephalic and atraumatic.  Mouth/Throat: Oropharynx is clear and moist and mucous  membranes are normal.  Eyes: Pupils are equal, round, and reactive to light. Conjunctivae are normal.  Cardiovascular: Normal rate and regular rhythm.  Murmur heard. Pulmonary/Chest: Effort normal and breath sounds normal.  Musculoskeletal:       Right hip: She exhibits tenderness.       Lumbar back: She exhibits tenderness.  Back:  Neurological: She is alert and oriented to person, place, and time. Gait normal.  Skin: Skin is warm, dry and intact.  Psychiatric: She has a normal mood and affect. Her speech is normal and behavior is normal. Judgment and thought content normal. Cognition and memory are normal.  Nursing note and vitals reviewed.   Assessment   1. 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 Right hip pain  3. Nausea ? Etiology  4. Cardiac mumur on exam  Plan   1. MRI low back  Consider PT, ortho spine/NS with epidural injection pt does not want surgery  Prn tramadol and mobic  2. Xray right hip today  3. Disc etiology could be meds  Consider PPI in future Ginger can help  4. Will disc with PCP to consider w/u with echo in future   Provider: Dr. Olivia Mackie McLean-Scocuzza-Internal Medicine

## 2018-03-01 NOTE — Patient Instructions (Addendum)
Reconsider PT Consider orthospine or neurosurgery for steroid shot in the lower back pending MRI  Tylenol max dose 500 mg up to 6 pills total in 1 day or 3000 mg in 1 day    Spinal Stenosis Spinal stenosis occurs when the open space (spinal canal) between the bones of your spine (vertebrae) narrows, putting pressure on the spinal cord or nerves. What are the causes? This condition is caused by areas of bone pushing into the central canals of your vertebrae. This condition may be present at birth (congenital), or it may be caused by:  Arthritic deterioration of your vertebrae (spinal degeneration). This usually starts around age 72.  Injury or trauma to the spine.  Tumors in the spine.  Calcium deposits in the spine.  What are the signs or symptoms? Symptoms of this condition include:  Pain in the neck or back that is generally worse with activities, particularly when standing and walking.  Numbness, tingling, hot or cold sensations, weakness, or weariness in your legs.  Pain going up and down the leg (sciatica).  Frequent episodes of falling.  A foot-slapping gait that leads to muscle weakness.  In more serious cases, you may develop:  Problemspassing stool or passing urine.  Difficulty having sex.  Loss of feeling in part or all of your leg.  Symptoms may come on slowly and get worse over time. How is this diagnosed? This condition is diagnosed based on your medical history and a physical exam. Tests will also be done, such as:  MRI.  CT scan.  X-ray.  How is this treated? Treatment for this condition often focuses on managing your pain and any other symptoms. Treatment may include:  Practicing good posture to lessen pressure on your nerves.  Exercising to strengthen muscles, build endurance, improve balance, and maintain good joint movement (range of motion).  Losing weight, if needed.  Taking medicines to reduce swelling, inflammation, or  pain.  Assistive devices, such as a corset or brace.  In some cases, surgery may be needed. The most common procedure is decompression laminectomy. This is done to remove excess bone that puts pressure on your nerve roots. Follow these instructions at home: Managing pain, stiffness, and swelling  Do all exercises and stretches as told by your health care provider.  Practice good posture. If you were given a brace or a corset, wear it as told by your health care provider.  Do not do any activities that cause pain. Ask your health care provider what activities are safe for you.  Do not lift anything that is heavier than 10 lb (4.5 kg) or the limit that your health care provider tells you.  Maintain a healthy weight. Talk with your health care provider if you need help losing weight.  If directed, apply heat to the affected area as often as told by your health care provider. Use the heat source that your health care provider recommends, such as a moist heat pack or a heating pad. ? Place a towel between your skin and the heat source. ? Leave the heat on for 20-30 minutes. ? Remove the heat if your skin turns bright red. This is especially important if you are not able to feel pain, heat, or cold. You may have a greater risk of getting burned. General instructions  Take over-the-counter and prescription medicines only as told by your health care provider.  Do not use any products that contain nicotine or tobacco, such as cigarettes and e-cigarettes. If you  need help quitting, ask your health care provider.  Eat a healthy diet. This includes plenty of fruits and vegetables, whole grains, and low-fat (lean) protein.  Keep all follow-up visits as told by your health care provider. This is important. Contact a health care provider if:  Your symptoms do not get better or they get worse.  You have a fever. Get help right away if:  You have new or worse pain in your neck or upper  back.  You have severe pain that cannot be controlled with medicines.  You are dizzy.  You have vision problems, blurred vision, or double vision.  You have a severe headache that is worse when you stand.  You have nausea or you vomit.  You develop new or worse numbness or tingling in your back or legs.  You have pain, redness, swelling, or warmth in your arm or leg. Summary  Spinal stenosis occurs when the open space (spinal canal) between the bones of your spine (vertebrae) narrows. This narrowing puts pressure on the spinal cord or nerves.  Spinal stenosis can cause numbness, weakness, or pain in the neck, back, and legs.  This condition may be caused by a birth defect, arthritic deterioration of your vertebrae, injury, tumors, or calcium deposits.  This condition is usually diagnosed with MRIs, CT scans, and X-rays. This information is not intended to replace advice given to you by your health care provider. Make sure you discuss any questions you have with your health care provider. Document Released: 12/20/2003 Document Revised: 09/03/2016 Document Reviewed: 09/03/2016 Elsevier Interactive Patient Education  2018 [employer].  Nausea, Adult Nausea is the feeling of an upset stomach or having to vomit. Nausea on its own is not usually a serious concern, but it may be an early sign of a more serious medical problem. As nausea gets worse, it can lead to vomiting. If vomiting develops, or if you are not able to drink enough fluids, you are at risk of becoming dehydrated. Dehydration can make you tired and thirsty, cause you to have a dry mouth, and decrease how often you urinate. Older adults and people with other diseases or a weak immune system are at higher risk for dehydration. The main goals of treating your nausea are:  To limit repeated nausea episodes.  To prevent vomiting and dehydration.  Follow these instructions at home: Follow instructions from your health care  provider about how to care for yourself at home. Eating and drinking Follow these recommendations as told by your health care provider:  Take an oral rehydration solution (ORS). This is a drink that is sold at pharmacies and retail stores.  Drink clear fluids in small amounts as you are able. Clear fluids include water, ice chips, diluted fruit juice, and low-calorie sports drinks.  Eat bland, easy-to-digest foods in small amounts as you are able. These foods include bananas, applesauce, rice, lean meats, toast, and crackers.  Avoid drinking fluids that contain a lot of sugar or caffeine, such as energy drinks, sports drinks, and soda.  Avoid alcohol.  Avoid spicy or fatty foods.  General instructions  Drink enough fluid to keep your urine clear or pale yellow.  Wash your hands often. If soap and water are not available, use hand sanitizer.  Make sure that all people in your household wash their hands well and often.  Rest at home while you recover.  Take over-the-counter and prescription medicines only as told by your health care provider.  Breathe slowly and  deeply when you feel nauseous.  Watch your condition for any changes.  Keep all follow-up visits as told by your health care provider. This is important. Contact a health care provider if:  You have a headache.  You have new symptoms.  Your nausea gets worse.  You have a fever.  You feel light-headed or dizzy.  You vomit.  You cannot keep fluids down. Get help right away if:  You have pain in your chest, neck, arm, or jaw.  You feel extremely weak or you faint.  You have vomit that is bright red or looks like coffee grounds.  You have bloody or black stools or stools that look like tar.  You have a severe headache, a stiff neck, or both.  You have severe pain, cramping, or bloating in your abdomen.  You have a rash.  You have difficulty breathing or are breathing very quickly.  Your heart is  beating very quickly.  Your skin feels cold and clammy.  You feel confused.  You have pain when you urinate.  You have signs of dehydration, such as: ? Dark urine, very little, or no urine. ? Cracked lips. ? Dry mouth. ? Sunken eyes. ? Sleepiness. ? Weakness. These symptoms may represent a serious problem that is an emergency. Do not wait to see if the symptoms will go away. Get medical help right away. Call your local emergency services (911 in the [place] Do not drive yourself to the hospital. This information is not intended to replace advice given to you by your health care provider. Make sure you discuss any questions you have with your health care provider. Document Released: 11/06/2004 Document Revised: 03/03/2016 Document Reviewed: 06/05/2015 Elsevier Interactive Patient Education  [employer].

## 2018-03-03 ENCOUNTER — Encounter (INDEPENDENT_AMBULATORY_CARE_PROVIDER_SITE_OTHER): Payer: Self-pay

## 2018-03-06 ENCOUNTER — Other Ambulatory Visit: Payer: Self-pay | Admitting: Internal Medicine

## 2018-03-06 DIAGNOSIS — M545 Low back pain, unspecified: Secondary | ICD-10-CM

## 2018-03-06 DIAGNOSIS — G8929 Other chronic pain: Secondary | ICD-10-CM

## 2018-03-06 DIAGNOSIS — M479 Spondylosis, unspecified: Secondary | ICD-10-CM

## 2018-03-06 MED ORDER — MELOXICAM 15 MG PO TABS: ORAL_TABLET | ORAL | 0 refills | Status: DC

## 2018-03-13 ENCOUNTER — Other Ambulatory Visit: Payer: Self-pay | Admitting: Internal Medicine

## 2018-03-17 ENCOUNTER — Ambulatory Visit
Admission: RE | Admit: 2018-03-17 | Discharge: 2018-03-17 | Disposition: A | Discharge status: Home / Self Care | Payer: PPO | Source: Ambulatory Visit | Attending: Internal Medicine | Admitting: Internal Medicine

## 2018-03-17 DIAGNOSIS — M479 Spondylosis, unspecified: Secondary | ICD-10-CM | POA: Diagnosis not present

## 2018-03-17 DIAGNOSIS — G8929 Other chronic pain: Secondary | ICD-10-CM | POA: Diagnosis not present

## 2018-03-17 DIAGNOSIS — M545 Low back pain, unspecified: Secondary | ICD-10-CM

## 2018-03-17 DIAGNOSIS — M5126 Other intervertebral disc displacement, lumbar region: Secondary | ICD-10-CM | POA: Diagnosis not present

## 2018-03-17 DIAGNOSIS — M48061 Spinal stenosis, lumbar region without neurogenic claudication: Secondary | ICD-10-CM | POA: Diagnosis not present

## 2018-03-17 DIAGNOSIS — M4807 Spinal stenosis, lumbosacral region: Secondary | ICD-10-CM | POA: Insufficient documentation

## 2018-03-18 ENCOUNTER — Other Ambulatory Visit: Payer: Self-pay | Admitting: Internal Medicine

## 2018-03-22 ENCOUNTER — Telehealth: Payer: Self-pay

## 2018-03-22 NOTE — Telephone Encounter (Signed)
Copied from [place] [id number]. Topic: Referral - Question >> Mar 22, 2018  9:25 AM Nils Flack wrote: Reason for CRM: pt is calling asking where the mri office is in [place] and how far out the appt would be.,  please call [phone]

## 2018-03-23 NOTE — Telephone Encounter (Signed)
There is no neurosurgery in [place]  Either [place], [place] or [place]  Where does she want to go.  I do not have a specific name but we refer to [place] [facility] in [place]  She needs to take copy of CD of MRI and get from place where she had MRI  [place]

## 2018-03-23 NOTE — Telephone Encounter (Signed)
Pt had her MRI on 6/5 and is in chart. Is this for the neurosurgery referral that you mentioned in the result note? If so, please enter referral for her so I can send it over.

## 2018-03-24 ENCOUNTER — Telehealth: Payer: Self-pay | Admitting: Internal Medicine

## 2018-03-24 ENCOUNTER — Other Ambulatory Visit: Payer: Self-pay | Admitting: Internal Medicine

## 2018-03-24 DIAGNOSIS — M5416 Radiculopathy, lumbar region: Secondary | ICD-10-CM

## 2018-03-24 DIAGNOSIS — R937 Abnormal findings on diagnostic imaging of other parts of musculoskeletal system: Secondary | ICD-10-CM

## 2018-03-24 NOTE — Telephone Encounter (Signed)
I don't see a referral in for neurosurgery but there is mention of patient going to see orthspine or neurosurgery. I'm not sure what to do with this message. Patient has had and MRI. Do you know if she has a referral in or did Dr. Aundra Dubin mention this to you.

## 2018-03-24 NOTE — Telephone Encounter (Signed)
Please Refer to Dr. Deetta Perla [facility] he comes to [facility] clinic   For lumbar radiculopathy right and abnormal MRI lumbar 03/17/18   [place]

## 2018-03-30 DIAGNOSIS — M5136 Other intervertebral disc degeneration, lumbar region: Secondary | ICD-10-CM | POA: Diagnosis not present

## 2018-03-30 DIAGNOSIS — M48061 Spinal stenosis, lumbar region without neurogenic claudication: Secondary | ICD-10-CM | POA: Diagnosis not present

## 2018-03-30 DIAGNOSIS — M4316 Spondylolisthesis, lumbar region: Secondary | ICD-10-CM | POA: Diagnosis not present

## 2018-03-30 DIAGNOSIS — M4150 Other secondary scoliosis, site unspecified: Secondary | ICD-10-CM | POA: Diagnosis not present

## 2018-03-30 DIAGNOSIS — M461 Sacroiliitis, not elsewhere classified: Secondary | ICD-10-CM | POA: Diagnosis not present

## 2018-04-04 ENCOUNTER — Other Ambulatory Visit: Payer: Self-pay | Admitting: Internal Medicine

## 2018-04-21 ENCOUNTER — Telehealth: Payer: Self-pay | Admitting: Internal Medicine

## 2018-04-21 DIAGNOSIS — I10 Essential (primary) hypertension: Secondary | ICD-10-CM

## 2018-04-21 DIAGNOSIS — E034 Atrophy of thyroid (acquired): Secondary | ICD-10-CM

## 2018-04-21 DIAGNOSIS — D649 Anemia, unspecified: Secondary | ICD-10-CM

## 2018-04-21 DIAGNOSIS — E119 Type 2 diabetes mellitus without complications: Secondary | ICD-10-CM

## 2018-04-21 NOTE — Telephone Encounter (Signed)
Pt is scheduled for her annual cpe on 06/15/18. She would like to know if she can have her labs drawn a few days before so that she does not have to fast all day on Sept. 3rd?  Please advise Pt call back [phone]

## 2018-04-21 NOTE — Telephone Encounter (Signed)
Fasting labs have been ordered and can be done anytim e  Lab Results  Component Value Date   HGBA1C 5.9 11/18/2017

## 2018-04-21 NOTE — Telephone Encounter (Signed)
Spoke to pt. Scheduled labs for 06/08/18

## 2018-05-10 DIAGNOSIS — F3342 Major depressive disorder, recurrent, in full remission: Secondary | ICD-10-CM | POA: Diagnosis not present

## 2018-05-13 ENCOUNTER — Other Ambulatory Visit: Payer: Self-pay | Admitting: Internal Medicine

## 2018-05-24 DIAGNOSIS — L309 Dermatitis, unspecified: Secondary | ICD-10-CM | POA: Diagnosis not present

## 2018-06-08 ENCOUNTER — Other Ambulatory Visit (INDEPENDENT_AMBULATORY_CARE_PROVIDER_SITE_OTHER): Payer: PPO

## 2018-06-08 DIAGNOSIS — D649 Anemia, unspecified: Secondary | ICD-10-CM | POA: Diagnosis not present

## 2018-06-08 DIAGNOSIS — E119 Type 2 diabetes mellitus without complications: Secondary | ICD-10-CM | POA: Diagnosis not present

## 2018-06-08 DIAGNOSIS — E034 Atrophy of thyroid (acquired): Secondary | ICD-10-CM | POA: Diagnosis not present

## 2018-06-08 LAB — COMPREHENSIVE METABOLIC PANEL
ALT: 11 U/L (ref 0–35)
AST: 17 U/L (ref 0–37)
Albumin: 4 g/dL (ref 3.5–5.2)
Alkaline Phosphatase: 68 U/L (ref 39–117)
BUN: 11 mg/dL (ref 6–23)
CALCIUM: 9.6 mg/dL (ref 8.4–10.5)
CHLORIDE: 92 meq/L — AB (ref 96–112)
CO2: 27 meq/L (ref 19–32)
CREATININE: 0.82 mg/dL (ref 0.40–1.20)
GFR: 72.1 mL/min (ref >60.00)
Glucose, Bld: 104 mg/dL — ABNORMAL HIGH (ref 70–99)
Potassium: 4.2 mEq/L (ref 3.5–5.1)
Sodium: 125 mEq/L — ABNORMAL LOW (ref 135–145)
Total Bilirubin: 0.8 mg/dL (ref 0.2–1.2)
Total Protein: 7.3 g/dL (ref 6.0–8.3)

## 2018-06-08 LAB — CBC WITH DIFFERENTIAL/PLATELET
BASOS PCT: 0.4 % (ref 0.0–3.0)
Basophils Absolute: 0 10*3/uL (ref 0.0–0.1)
EOS ABS: 0.1 10*3/uL (ref 0.0–0.7)
Eosinophils Relative: 1.3 % (ref 0.0–5.0)
HCT: 36.6 % (ref 36.0–46.0)
Hemoglobin: 12.6 g/dL (ref 12.0–15.0)
LYMPHS PCT: 7.9 % — AB (ref 12.0–46.0)
Lymphs Abs: 0.8 10*3/uL (ref 0.7–4.0)
MCHC: 34.5 g/dL (ref 30.0–36.0)
MCV: 91.1 fl (ref 78.0–100.0)
MONOS PCT: 8.4 % (ref 3.0–12.0)
Monocytes Absolute: 0.9 10*3/uL (ref 0.1–1.0)
Neutro Abs: 8.3 10*3/uL — ABNORMAL HIGH (ref 1.4–7.7)
Neutrophils Relative %: 82 % — ABNORMAL HIGH (ref 43.0–77.0)
Platelets: 304 10*3/uL (ref 150.0–400.0)
RBC: 4.02 Mil/uL (ref 3.87–5.11)
RDW: 13.2 % (ref 11.5–15.5)
WBC: 10.2 10*3/uL (ref 4.0–10.5)

## 2018-06-08 LAB — LIPID PANEL
CHOL/HDL RATIO: 2
Cholesterol: 133 mg/dL (ref 0–200)
HDL: 68.1 mg/dL (ref >39.00)
LDL Cholesterol: 55 mg/dL (ref 0–99)
NonHDL: 65.14
TRIGLYCERIDES: 49 mg/dL (ref 0.0–149.0)
VLDL: 9.8 mg/dL (ref 0.0–40.0)

## 2018-06-08 LAB — TSH: TSH: 1.7 u[IU]/mL (ref 0.35–4.50)

## 2018-06-08 LAB — HEMOGLOBIN A1C: Hgb A1c MFr Bld: 5.8 % (ref 4.6–6.5)

## 2018-06-11 ENCOUNTER — Encounter: Payer: Self-pay | Admitting: Internal Medicine

## 2018-06-11 ENCOUNTER — Other Ambulatory Visit: Payer: Self-pay | Admitting: Internal Medicine

## 2018-06-11 ENCOUNTER — Telehealth: Payer: Self-pay | Admitting: Internal Medicine

## 2018-06-11 DIAGNOSIS — E871 Hypo-osmolality and hyponatremia: Secondary | ICD-10-CM | POA: Insufficient documentation

## 2018-06-11 NOTE — Telephone Encounter (Signed)
Copied from [place] [id number]. Topic: Quick Communication - See Telephone Encounter >> Jun 11, 2018  2:30 PM Antonieta Iba C wrote: CRM for notification. See Telephone encounter for: 06/11/18.  Pt called into request her results. Pt says that her computer is down so she is unable to view her results.

## 2018-06-11 NOTE — Assessment & Plan Note (Signed)
Stopping hctz ,  Recheck one week

## 2018-06-11 NOTE — Telephone Encounter (Signed)
Lab results

## 2018-06-15 ENCOUNTER — Ambulatory Visit (INDEPENDENT_AMBULATORY_CARE_PROVIDER_SITE_OTHER): Payer: PPO | Admitting: Internal Medicine

## 2018-06-15 ENCOUNTER — Encounter: Payer: Self-pay | Admitting: Internal Medicine

## 2018-06-15 VITALS — BP 112/62 | HR 70 | Temp 98.3°F | Resp 15 | Ht 64.5 in | Wt 143.4 lb

## 2018-06-15 DIAGNOSIS — M545 Low back pain, unspecified: Secondary | ICD-10-CM

## 2018-06-15 DIAGNOSIS — Z0000 Encounter for general adult medical examination without abnormal findings: Secondary | ICD-10-CM

## 2018-06-15 DIAGNOSIS — Z716 Tobacco abuse counseling: Secondary | ICD-10-CM

## 2018-06-15 DIAGNOSIS — M5126 Other intervertebral disc displacement, lumbar region: Secondary | ICD-10-CM | POA: Diagnosis not present

## 2018-06-15 DIAGNOSIS — E871 Hypo-osmolality and hyponatremia: Secondary | ICD-10-CM

## 2018-06-15 DIAGNOSIS — E119 Type 2 diabetes mellitus without complications: Secondary | ICD-10-CM

## 2018-06-15 DIAGNOSIS — G8929 Other chronic pain: Secondary | ICD-10-CM | POA: Diagnosis not present

## 2018-06-15 DIAGNOSIS — Z23 Encounter for immunization: Secondary | ICD-10-CM | POA: Diagnosis not present

## 2018-06-15 LAB — BASIC METABOLIC PANEL
BUN: 16 mg/dL (ref 6–23)
CALCIUM: 9.1 mg/dL (ref 8.4–10.5)
CO2: 25 mEq/L (ref 19–32)
Chloride: 94 mEq/L — ABNORMAL LOW (ref 96–112)
Creatinine, Ser: 0.86 mg/dL (ref 0.40–1.20)
GFR: 68.24 mL/min (ref >60.00)
Glucose, Bld: 128 mg/dL — ABNORMAL HIGH (ref 70–99)
POTASSIUM: 4 meq/L (ref 3.5–5.1)
SODIUM: 127 meq/L — AB (ref 135–145)

## 2018-06-15 NOTE — Assessment & Plan Note (Addendum)
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 Med suspended and repeat level shows mild improvement . Repeat in [redacted] weeks along with serum osmo and ADH

## 2018-06-15 NOTE — Assessment & Plan Note (Addendum)
Currently endorsing right hip pain.  Prior films in May noted normal hip joint,  Spondylosis of lumbar spine.  MRI done showed moderate and severe degenerative changes at mulitple levesl resulting in formain stnoeis and possible radiculitis on the right at the L4 and L5 levels.  Receiving PT

## 2018-06-15 NOTE — Assessment & Plan Note (Signed)
well-controlled on diet alone.  hemoglobin A1c has been consistently at or  less than 7.0 checked every 6 months  . Patient is up-to-date on eye exams and foot exam is normal today. Patient has no proteinuria and is tolerating statin therapy for CAD risk reduction.  BP has been elevated on the last several visits,  Given her ongoing tobacco abuse, will recommend continued attempts at smoking cessation,  Continued use of daily aspirin  81 mg and 100 mg losartan .  Lab Results  Component Value Date   HGBA1C 5.8 06/08/2018   Lab Results  Component Value Date   MICROALBUR <0.7 11/18/2017

## 2018-06-15 NOTE — Assessment & Plan Note (Signed)
Smoking cessation instruction/counseling given.   Risks of continued tobacco use were discussed. She is not currently interested in tobacco cessation.  

## 2018-06-15 NOTE — Patient Instructions (Addendum)
We are rechecking your sodium level today.  DO NOT RESUME HCTZ.  Continue losartan  PLEASE mention your right hip pain to Dr Lucky Cowboy at your visit , since your hip x ray was normal   The most important think you can do to prevent heart attacks and strokes is QUIT SMOKING/    Don't  skip meals!  I Recommend  Trying one of these Low Carb high Protein premixed Shakes:   Premier Protein  Atkins Advantage Muscle Milk EAS AdvantEdge   All of these are available at [employer], [place],  [place]  And taste good    Health Maintenance for Postmenopausal Women Menopause is a normal process in which your reproductive ability comes to an end. This process happens gradually over a span of months to years, usually between the ages of 50 and 71. Menopause is complete when you have missed 12 consecutive menstrual periods. It is important to talk with your health care provider about some of the most common conditions that affect postmenopausal women, such as heart disease, cancer, and bone loss (osteoporosis). Adopting a healthy lifestyle and getting preventive care can help to promote your health and wellness. Those actions can also lower your chances of developing some of these common conditions. What should I know about menopause? During menopause, you may experience a number of symptoms, such as:  Moderate-to-severe hot flashes.  Night sweats.  Decrease in sex drive.  Mood swings.  Headaches.  Tiredness.  Irritability.  Memory problems.  Insomnia.  Choosing to treat or not to treat menopausal changes is an individual decision that you make with your health care provider. What should I know about hormone replacement therapy and supplements? Hormone therapy products are effective for treating symptoms that are associated with menopause, such as hot flashes and night sweats. Hormone replacement carries certain risks, especially as you become older. If you are thinking about  using estrogen or estrogen with progestin treatments, discuss the benefits and risks with your health care provider. What should I know about heart disease and stroke? Heart disease, heart attack, and stroke become more likely as you age. This may be due, in part, to the hormonal changes that your body experiences during menopause. These can affect how your body processes dietary fats, triglycerides, and cholesterol. Heart attack and stroke are both medical emergencies. There are many things that you can do to help prevent heart disease and stroke:  Have your blood pressure checked at least every 1-2 years. High blood pressure causes heart disease and increases the risk of stroke.  If you are 61-69 years old, ask your health care provider if you should take aspirin to prevent a heart attack or a stroke.  Do not use any tobacco products, including cigarettes, chewing tobacco, or electronic cigarettes. If you need help quitting, ask your health care provider.  It is important to eat a healthy diet and maintain a healthy weight. ? Be sure to include plenty of vegetables, fruits, low-fat dairy products, and lean protein. ? Avoid eating foods that are high in solid fats, added sugars, or salt (sodium).  Get regular exercise. This is one of the most important things that you can do for your health. ? Try to exercise for at least 150 minutes each week. The type of exercise that you do should increase your heart rate and make you sweat. This is known as moderate-intensity exercise. ? Try to do strengthening exercises at least twice each week. Do these  in addition to the moderate-intensity exercise.  Know your numbers.Ask your health care provider to check your cholesterol and your blood glucose. Continue to have your blood tested as directed by your health care provider.  What should I know about cancer screening? There are several types of cancer. Take the following steps to reduce your risk and to  catch any cancer development as early as possible. Breast Cancer  Practice breast self-awareness. ? This means understanding how your breasts normally appear and feel. ? It also means doing regular breast self-exams. Let your health care provider know about any changes, no matter how small.  If you are 65 or older, have a clinician do a breast exam (clinical breast exam or CBE) every year. Depending on your age, family history, and medical history, it may be recommended that you also have a yearly breast X-ray (mammogram).  If you have a family history of breast cancer, talk with your health care provider about genetic screening.  If you are at high risk for breast cancer, talk with your health care provider about having an MRI and a mammogram every year.  Breast cancer (BRCA) gene test is recommended for women who have family members with BRCA-related cancers. Results of the assessment will determine the need for genetic counseling and BRCA1 and for BRCA2 testing. BRCA-related cancers include these types: ? Breast. This occurs in males or females. ? Ovarian. ? Tubal. This may also be called fallopian tube cancer. ? Cancer of the abdominal or pelvic lining (peritoneal cancer). ? Prostate. ? Pancreatic.  Cervical, Uterine, and Ovarian Cancer Your health care provider may recommend that you be screened regularly for cancer of the pelvic organs. These include your ovaries, uterus, and vagina. This screening involves a pelvic exam, which includes checking for microscopic changes to the surface of your cervix (Pap test).  For women ages 21-65, health care providers may recommend a pelvic exam and a Pap test every three years. For women ages 43-65, they may recommend the Pap test and pelvic exam, combined with testing for human papilloma virus (HPV), every five years. Some types of HPV increase your risk of cervical cancer. Testing for HPV may also be done on women of any age who have unclear Pap  test results.  Other health care providers may not recommend any screening for nonpregnant women who are considered low risk for pelvic cancer and have no symptoms. Ask your health care provider if a screening pelvic exam is right for you.  If you have had past treatment for cervical cancer or a condition that could lead to cancer, you need Pap tests and screening for cancer for at least 20 years after your treatment. If Pap tests have been discontinued for you, your risk factors (such as having a new sexual partner) need to be reassessed to determine if you should start having screenings again. Some women have medical problems that increase the chance of getting cervical cancer. In these cases, your health care provider may recommend that you have screening and Pap tests more often.  If you have a family history of uterine cancer or ovarian cancer, talk with your health care provider about genetic screening.  If you have vaginal bleeding after reaching menopause, tell your health care provider.  There are currently no reliable tests available to screen for ovarian cancer.  Lung Cancer Lung cancer screening is recommended for adults 40-19 years old who are at high risk for lung cancer because of a history of smoking.  A yearly low-dose CT scan of the lungs is recommended if you:  Currently smoke.  Have a history of at least 30 pack-years of smoking and you currently smoke or have quit within the past 15 years. A pack-year is smoking an average of one pack of cigarettes per day for one year.  Yearly screening should:  Continue until it has been 15 years since you quit.  Stop if you develop a health problem that would prevent you from having lung cancer treatment.  Colorectal Cancer  This type of cancer can be detected and can often be prevented.  Routine colorectal cancer screening usually begins at age 104 and continues through age 53.  If you have risk factors for colon cancer, your  health care provider may recommend that you be screened at an earlier age.  If you have a family history of colorectal cancer, talk with your health care provider about genetic screening.  Your health care provider may also recommend using home test kits to check for hidden blood in your stool.  A small camera at the end of a tube can be used to examine your colon directly (sigmoidoscopy or colonoscopy). This is done to check for the earliest forms of colorectal cancer.  Direct examination of the colon should be repeated every 5-10 years until age 29. However, if early forms of precancerous polyps or small growths are found or if you have a family history or genetic risk for colorectal cancer, you may need to be screened more often.  Skin Cancer  Check your skin from head to toe regularly.  Monitor any moles. Be sure to tell your health care provider: ? About any new moles or changes in moles, especially if there is a change in a mole's shape or color. ? If you have a mole that is larger than the size of a pencil eraser.  If any of your family members has a history of skin cancer, especially at a young age, talk with your health care provider about genetic screening.  Always use sunscreen. Apply sunscreen liberally and repeatedly throughout the day.  Whenever you are outside, protect yourself by wearing long sleeves, pants, a wide-brimmed hat, and sunglasses.  What should I know about osteoporosis? Osteoporosis is a condition in which bone destruction happens more quickly than new bone creation. After menopause, you may be at an increased risk for osteoporosis. To help prevent osteoporosis or the bone fractures that can happen because of osteoporosis, the following is recommended:  If you are 73-46 years old, get at least 1,000 mg of calcium and at least 600 mg of vitamin D per day.  If you are older than age 52 but younger than age 39, get at least 1,200 mg of calcium and at least 600  mg of vitamin D per day.  If you are older than age 76, get at least 1,200 mg of calcium and at least 800 mg of vitamin D per day.  Smoking and excessive alcohol intake increase the risk of osteoporosis. Eat foods that are rich in calcium and vitamin D, and do weight-bearing exercises several times each week as directed by your health care provider. What should I know about how menopause affects my mental health? Depression may occur at any age, but it is more common as you become older. Common symptoms of depression include:  Low or sad mood.  Changes in sleep patterns.  Changes in appetite or eating patterns.  Feeling an overall lack of motivation  or enjoyment of activities that you previously enjoyed.  Frequent crying spells.  Talk with your health care provider if you think that you are experiencing depression. What should I know about immunizations? It is important that you get and maintain your immunizations. These include:  Tetanus, diphtheria, and pertussis (Tdap) booster vaccine.  Influenza every year before the flu season begins.  Pneumonia vaccine.  Shingles vaccine.  Your health care provider may also recommend other immunizations. This information is not intended to replace advice given to you by your health care provider. Make sure you discuss any questions you have with your health care provider. Document Released: 11/21/2005 Document Revised: 04/18/2016 Document Reviewed: 07/03/2015 Elsevier Interactive Patient Education  2018 [employer].

## 2018-06-15 NOTE — Progress Notes (Signed)
Patient ID: Jane Davidson, female    DOB: August 30, 1941  Age: 77 y.o. MRN: [id number]  The patient is here for annual preventive examination and management of other chronic and acute problems.   The risk factors are reflected in the social history.  The roster of all physicians providing medical care to patient - is listed in the Snapshot section of the chart.  Activities of daily living:  The patient is 100% independent in all ADLs: dressing, toileting, feeding as well as independent mobility  Home safety : The patient has smoke detectors in the home. They wear seatbelts.  There are no firearms at home. There is no violence in the home.   There is no risks for hepatitis, STDs or HIV. There is no   history of blood transfusion. They have no travel history to infectious disease endemic areas of the world.  The patient has seen their dentist in the last six month. They have seen their eye doctor in the last year. They admit to slight hearing difficulty with regard to whispered voices and some television programs.  They have deferred audiologic testing in the last year.  They do not  have excessive sun exposure. Discussed the need for sun protection: hats, long sleeves and use of sunscreen if there is significant sun exposure.   Diet: the importance of a healthy diet is discussed. They do have a healthy diet.  The benefits of regular aerobic exercise were discussed. She walks 4 times per week ,  20 minutes.   Depression screen: there are no signs or vegative symptoms of depression- irritability, change in appetite, anhedonia, sadness/tearfullness.  Cognitive assessment: the patient manages all their financial and personal affairs and is actively engaged. They could relate day,date,year and events; recalled 2/3 objects at 3 minutes; performed clock-face test normally.  The following portions of the patient's history were reviewed and updated as appropriate: allergies, current medications, past  family history, past medical history,  past surgical history, past social history  and problem list.  Visual acuity was not assessed per patient preference since she has regular follow up with her ophthalmologist. Hearing and body mass index were assessed and reviewed.   During the course of the visit the patient was educated and counseled about appropriate screening and preventive services including : fall prevention , diabetes screening, nutrition counseling, colorectal cancer screening, and recommended immunizations.    CC: The primary encounter diagnosis was Encounter for preventive health examination. Diagnoses of Need for influenza vaccination, Hyponatremia, Diabetes mellitus without complication ([place]), Chronic right-sided low back pain without sciatica, Lumbar herniated disc, and Tobacco abuse counseling were also pertinent to this visit.  1) right hip pain occurs with walking, but not at other times  Hip x ray  Done in May was normal.   2) smoking one pack daily    History Jane Davidson has a past medical history of Abnormal Pap smear of cervix (2001), Bursitis NEC, COPD (chronic obstructive pulmonary disease) ([place]), History of peripheral vascular disease (2001), Hyperlipidemia, hypothyroid, Osteopenia (2009 ), Screening for breast cancer (May 2012), Tobacco abuse disorder, Vitamin D deficiency (April 2011), and Vitamin D deficiency (April 2011).   She has a past surgical history that includes Hernia repair (Jun 2011); Cataract extraction (Sept 2011); Cholecystectomy; Tubal ligation; Breast biopsy (Left); and Carotid endarterectomy (Right, 2016).   Her family history includes Arthritis in her mother; Diabetes in her father; Hypertension in her mother.She reports that she has been smoking cigarettes. She has a 30.00 pack-year  smoking history. She has never used smokeless tobacco. She reports that she does not drink alcohol or use drugs.  Outpatient Medications Prior to Visit  Medication Sig  Dispense Refill  . ADVAIR DISKUS 100-50 MCG/DOSE AEPB INHALE 1 PUFF BY MOUTH TWICE DAILY 1 each 3  . augmented betamethasone dipropionate (DIPROLENE-AF) 0.05 % cream   1  . Cholecalciferol (VITAMIN D3) 1000 UNITS CAPS Take 1 capsule by mouth daily.    . clobetasol ointment (TEMOVATE) 8.84 % Apply 1 application topically 2 (two) times daily. 30 g 5  . fluticasone (FLONASE) 50 MCG/ACT nasal spray SHAKE LIQUID AND USE 2 SPRAYS IN EACH NOSTRIL DAILY 16 g 2  . loratadine (CLARITIN) 10 MG tablet Take 1 tablet (10 mg total) by mouth daily. 30 tablet 11  . losartan (COZAAR) 100 MG tablet TAKE 1 TABLET(100 MG) BY MOUTH DAILY 90 tablet 1  . meloxicam (MOBIC) 15 MG tablet TAKE 1 TABLET(15 MG) BY MOUTH DAILY with food 90 tablet 0  . sertraline (ZOLOFT) 50 MG tablet Take 1 tablet (50 mg total) by mouth daily. 90 tablet 1  . simvastatin (ZOCOR) 20 MG tablet TAKE 1 TABLET BY MOUTH DAILY 90 tablet 1  . SYNTHROID 100 MCG tablet TAKE 1 TABLET BY MOUTH EVERY DAY 90 tablet 1  . traMADol (ULTRAM) 50 MG tablet Take 1 tablet (50 mg total) by mouth every 6 (six) hours as needed. 120 tablet 0  . clotrimazole (LOTRIMIN) 1 % cream Apply 1 application topically 2 (two) times daily. (Patient not taking: Reported on 06/15/2018) 30 g 1  . cyanocobalamin 100 MCG tablet Take 100 mcg by mouth daily.      . Influenza vac split quadrivalent PF (FLUZONE HIGH-DOSE) 0.5 ML injection Fluzone High-Dose 2016-2017 (PF) 180 mcg/0.5 mL intramuscular syringe  ADM 0.5ML IM UTD    . triamcinolone cream (KENALOG) 0.1 % Apply 1 application topically 2 (two) times daily. (Patient not taking: Reported on 06/15/2018) 30 g 5  . Vitamin D, Ergocalciferol, (DRISDOL) 50000 units CAPS capsule Take by mouth.     No facility-administered medications prior to visit.     Review of Systems  Patient denies headache, fevers, malaise, unintentional weight loss, skin rash, eye pain, sinus congestion and sinus pain, sore throat, dysphagia,  hemoptysis , cough,  dyspnea, wheezing, chest pain, palpitations, orthopnea, edema, abdominal pain, nausea, melena, diarrhea, constipation, flank pain, dysuria, hematuria, urinary  Frequency, nocturia, numbness, tingling, seizures,  Focal weakness, Loss of consciousness,  Tremor, insomnia, depression, anxiety, and suicidal ideation.     Objective:  BP 112/62 (BP Location: Left Arm, Patient Position: Sitting, Cuff Size: Normal)   Pulse 70   Temp 98.3 F (36.8 C) (Oral)   Resp 15   Ht 5' 4.5" (1.638 m)   Wt 143 lb 6.4 oz (65 kg)   SpO2 96%   BMI 24.23 kg/m   Physical Exam   General appearance: alert, cooperative and appears stated age Head: Normocephalic, without obvious abnormality, atraumatic Eyes: conjunctivae/corneas clear. PERRL, EOM's intact. Fundi benign. Ears: normal TM's and external ear canals both ears Nose: Nares normal. Septum midline. Mucosa normal. No drainage or sinus tenderness. Throat: lips, mucosa, and tongue normal; teeth and gums normal Neck: no adenopathy, no carotid bruit, no JVD, supple, symmetrical, trachea midline and thyroid not enlarged, symmetric, no tenderness/mass/nodules. Right carotid surgery scars Lungs: clear to auscultation bilaterally Breasts: normal appearance, no masses or tenderness Heart: regular rate and rhythm, S1, S2 normal, no murmur, click, rub or gallop Abdomen: soft,  non-tender; bowel sounds normal; no masses,  no organomegaly Extremities: extremities normal, atraumatic, no cyanosis or edema Pulses: 2+ on left, DP, 1+  On the right  Skin: sun damaged skin turgor normal for age No rashes or lesions Neurologic: Alert and oriented X 3, normal strength and tone. Normal symmetric reflexes. Normal coordination and gait.      Assessment & Plan:   Problem List Items Addressed This Visit    Chronic low back pain without sciatica    Currently endorsing right hip pain.  Prior films in May noted normal hip joint,  Spondylosis of lumbar spine.  MRI done showed  moderate and severe degenerative changes at mulitple levesl resulting in formain stnoeis and possible radiculitis on the right at the L4 and L5 levels.  Receiving PT      Diabetes mellitus without complication ([place])     well-controlled on diet alone.  hemoglobin A1c has been consistently at or  less than 7.0 checked every 6 months  . Patient is up-to-date on eye exams and foot exam is normal today. Patient has no proteinuria and is tolerating statin therapy for CAD risk reduction.  BP has been elevated on the last several visits,  Given her ongoing tobacco abuse, will recommend continued attempts at smoking cessation,  Continued use of daily aspirin  81 mg and 100 mg losartan .  Lab Results  Component Value Date   HGBA1C 5.8 06/08/2018   Lab Results  Component Value Date   MICROALBUR <0.7 11/18/2017         Encounter for preventive health examination - Primary    Annual comprehensive preventive exam was done as well as an evaluation and management of chronic conditions .  During the course of the visit the patient was educated and counseled about appropriate screening and preventive services including :  diabetes screening, lipid analysis with projected  10 year  risk for CAD , nutrition counseling, breast, cervical and colorectal cancer screening, and recommended immunizations.  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 Med suspended and repeat level shows mild improvement . Repeat in [redacted] weeks along with serum osmo and ADH      Lumbar herniated disc   Tobacco abuse counseling    Smoking cessation instruction/counseling given.   Risks of continued tobacco use were discussed. She is not currently interested in tobacco cessation.        Other Visit Diagnoses    Need for influenza vaccination       Relevant Orders   Flu vaccine HIGH DOSE PF (Fluzone High dose)  (Completed)      I have discontinued Jane Davidson. Paparella's cyanocobalamin, triamcinolone cream, clotrimazole, Vitamin D (Ergocalciferol), and Influenza vac split quadrivalent PF. I am also having her maintain her Vitamin D3, clobetasol ointment, sertraline, loratadine, ADVAIR DISKUS, traMADol, meloxicam, losartan, simvastatin, fluticasone, SYNTHROID, and augmented betamethasone dipropionate.  No orders of the defined types were placed in this encounter.   Medications Discontinued During This Encounter  Medication Reason  . clotrimazole (LOTRIMIN) 1 % cream Patient has not taken in last 30 days  . cyanocobalamin 100 MCG tablet Patient has not taken in last 30 days  . Influenza vac split quadrivalent PF (FLUZONE HIGH-DOSE) 0.5 ML injection   . triamcinolone cream (KENALOG) 0.1 % Patient has not taken in last 30 days  . Vitamin D, Ergocalciferol, (DRISDOL) 50000  units CAPS capsule Completed Course    Follow-up: No follow-ups on file.   Crecencio Mc, MD

## 2018-06-15 NOTE — Assessment & Plan Note (Signed)
Annual comprehensive preventive exam was done as well as an evaluation and management of chronic conditions .  During the course of the visit the patient was educated and counseled about appropriate screening and preventive services including :  diabetes screening, lipid analysis with projected  10 year  risk for CAD , nutrition counseling, breast, cervical and colorectal cancer screening, and recommended immunizations.  Printed recommendations for health maintenance screenings was given 

## 2018-06-16 ENCOUNTER — Other Ambulatory Visit: Payer: Self-pay | Admitting: Internal Medicine

## 2018-06-16 ENCOUNTER — Telehealth: Payer: Self-pay | Admitting: Internal Medicine

## 2018-06-16 DIAGNOSIS — E871 Hypo-osmolality and hyponatremia: Secondary | ICD-10-CM

## 2018-06-16 NOTE — Telephone Encounter (Signed)
Pt given lab results and recommendations per notes of Dr. Derrel Nip on 06/16/18. Pt verbalized understanding. Lab appt scheduled for 06/30/18. Unable to document in result note.

## 2018-06-16 NOTE — Telephone Encounter (Signed)
LMTCB in regards to lab results from 06/15/2018. They were sent to pt via mychart but pt's computer is down. [facility] may speak with pt.

## 2018-06-16 NOTE — Telephone Encounter (Signed)
Pt calling to get results.  Copied from [place] [id number]. Topic: Quick Communication - Lab Results >> Jun 16, 2018  2:34 PM Adair Laundry, CMA wrote: Called patient to inform them of 06/15/2018 lab results. When patient returns call, triage nurse may disclose results.  [facility] may speak with pt.

## 2018-06-17 NOTE — Telephone Encounter (Signed)
See previous message. Pt has been advised of lab results and pt has verbalized understanding.

## 2018-06-26 ENCOUNTER — Telehealth: Payer: Self-pay

## 2018-06-26 NOTE — Telephone Encounter (Signed)
Call pt regarding lung screening. Pt is a current smoker. Smoking about 1/2 pack per day. Pt would like scan to be scheduled for Nov. Any day morning.

## 2018-06-28 ENCOUNTER — Other Ambulatory Visit (INDEPENDENT_AMBULATORY_CARE_PROVIDER_SITE_OTHER): Payer: PPO

## 2018-06-28 ENCOUNTER — Encounter: Payer: Self-pay | Admitting: *Deleted

## 2018-06-28 ENCOUNTER — Other Ambulatory Visit: Payer: Self-pay | Admitting: *Deleted

## 2018-06-28 DIAGNOSIS — Z122 Encounter for screening for malignant neoplasm of respiratory organs: Secondary | ICD-10-CM

## 2018-06-28 DIAGNOSIS — E871 Hypo-osmolality and hyponatremia: Secondary | ICD-10-CM

## 2018-06-28 LAB — BASIC METABOLIC PANEL
BUN: 14 mg/dL (ref 6–23)
CHLORIDE: 101 meq/L (ref 96–112)
CO2: 28 meq/L (ref 19–32)
CREATININE: 0.75 mg/dL (ref 0.40–1.20)
Calcium: 9 mg/dL (ref 8.4–10.5)
GFR: 79.91 mL/min (ref >60.00)
Glucose, Bld: 109 mg/dL — ABNORMAL HIGH (ref 70–99)
POTASSIUM: 4.3 meq/L (ref 3.5–5.1)
Sodium: 131 mEq/L — ABNORMAL LOW (ref 135–145)

## 2018-06-29 ENCOUNTER — Telehealth: Payer: Self-pay | Admitting: *Deleted

## 2018-06-29 NOTE — Telephone Encounter (Signed)
Called to inform pt of her appt for LDCT screening on Monday 08/16/2018 @ 1:10pm here @ [facility], voiced understanding.

## 2018-06-30 ENCOUNTER — Other Ambulatory Visit: Payer: PPO

## 2018-07-01 LAB — COPEPTIN: COPEPTIN: 4 pmol/L (ref <=14)

## 2018-07-01 LAB — OSMOLALITY: OSMOLALITY: 284 mosm/kg (ref 278–305)

## 2018-07-06 DIAGNOSIS — L309 Dermatitis, unspecified: Secondary | ICD-10-CM | POA: Diagnosis not present

## 2018-08-13 ENCOUNTER — Ambulatory Visit: Payer: Self-pay | Admitting: *Deleted

## 2018-08-13 ENCOUNTER — Encounter: Payer: Self-pay | Admitting: Internal Medicine

## 2018-08-13 ENCOUNTER — Ambulatory Visit (INDEPENDENT_AMBULATORY_CARE_PROVIDER_SITE_OTHER): Payer: PPO | Admitting: Internal Medicine

## 2018-08-13 VITALS — BP 172/78 | HR 108 | Temp 98.7°F | Resp 15 | Ht 64.5 in | Wt 143.0 lb

## 2018-08-13 DIAGNOSIS — S30860A Insect bite (nonvenomous) of lower back and pelvis, initial encounter: Secondary | ICD-10-CM

## 2018-08-13 DIAGNOSIS — R0789 Other chest pain: Secondary | ICD-10-CM

## 2018-08-13 DIAGNOSIS — W57XXXA Bitten or stung by nonvenomous insect and other nonvenomous arthropods, initial encounter: Secondary | ICD-10-CM | POA: Diagnosis not present

## 2018-08-13 DIAGNOSIS — R079 Chest pain, unspecified: Secondary | ICD-10-CM | POA: Diagnosis not present

## 2018-08-13 DIAGNOSIS — I10 Essential (primary) hypertension: Secondary | ICD-10-CM | POA: Diagnosis not present

## 2018-08-13 MED ORDER — METOPROLOL SUCCINATE ER 25 MG PO TB24: 25.0000 mg | ORAL_TABLET | Freq: Every day | ORAL | 3 refills | Status: DC

## 2018-08-13 MED ORDER — NITROGLYCERIN 0.4 MG SL SUBL: 0.4000 mg | SUBLINGUAL_TABLET | SUBLINGUAL | 3 refills | Status: DC | PRN

## 2018-08-13 NOTE — Patient Instructions (Addendum)
   I am adding a medication for blood pressure called Metoprolol Succinate  25 mg   Take it once daily in the evening  Continue losartan in the morning    Our goal is to get your blood pressure down to 130-140/70-80  .  Start taking your bp once daily after 4 days  And send me  3 or 4 readings in one week    Your recent episode of chest pain sounds like it was due to reflux.  However:  If you develop chest pain that does not respond to Tums,  Take the nitroglycerin tablet under the tongue.  You can repeat this in 5 minutes if needed.  If the pain does not resolve after 2 doses of nitroglycerin,  You should go to the hospital

## 2018-08-13 NOTE — Telephone Encounter (Signed)
Pt states her b/p has been going up since last evening. Her b/p  an hour ago was 200/88 and within the hour was 193/88. Last night it was  186/81. She has taken her b/p medications this morning and have not missed any doses. She denies headache, blurred vision, weakness, tingling in extremities, chest pain or nausea. She wonders if it is her anxiety that is causing her b/p to be elevated.  She also stated that she had a bite on her buttocks that is tender to touch. She does not know what it was that bit her. The bite is on her right upper buttocks. It is red where the bite was. She removed the "head" and brownish drainage came out.  Appointment scheduled per protocol for today. Recommended that she call 911 if she starts developing symptoms such as headache, chest pain, nausea, blurred vision, sweating or difficulty breathing. Pt voiced understanding. Will route to flow at [facility].  Reason for Disposition . [1] Red or very tender (to touch) area AND [2] started over 24 hours after the bite  Answer Assessment - Initial Assessment Questions 1. BLOOD PRESSURE: "What is the blood pressure?" "Did you take at least two measurements 5 minutes apart?"     200/88 and 193/88 HR 95 2. ONSET: "When did you take your blood pressure?"     This morning 3. HOW: "How did you obtain the blood pressure?" (e.g., visiting nurse, automatic home BP monitor)     Home BP monitor 4. HISTORY: "Do you have a history of high blood pressure?"     yes 5. MEDICATIONS: "Are you taking any medications for blood pressure?" "Have you missed any doses recently?"     Yes, have not missed any doses 6. OTHER SYMPTOMS: "Do you have any symptoms?" (e.g., headache, chest pain, blurred vision, difficulty breathing, weakness)     Tightness in chest 7. PREGNANCY: "Is there any chance you are pregnant?" "When was your last menstrual period?"     n/a  Answer Assessment - Initial Assessment Questions 1. TYPE of INSECT:  "What type of insect was it?"      Does not know 2. ONSET: "When did you get bitten?"      Last week 3. LOCATION: "Where is the insect bite located?"      Right upper buttocks 4. REDNESS: "Is the area red or pink?" If so, ask "What size is area of redness?" (inches or cm). "When did the redness start?"     Bite area is red 5. PAIN: "Is there any pain?" If so, ask: "How bad is it?"  (Scale 1-10; or mild, moderate, severe)     Tender to touch 6. ITCHING: "Does it itch?" If so, ask: "How bad is the itch?"    - MILD: doesn't interfere with normal activities   - MODERATE - SEVERE: interferes with work, school, sleep, or other activities      No itching 7. SWELLING: "How big is the swelling?" (inches, cm, or compare to coins)     No swelling 8. OTHER SYMPTOMS: "Do you have any other symptoms?"  (e.g., difficulty breathing, hives)     no 9. PREGNANCY: "Is there any chance you are pregnant?" "When was your last menstrual period?"     n/a  Protocols used: INSECT BITE-A-AH, HIGH BLOOD PRESSURE-A-AH

## 2018-08-13 NOTE — Telephone Encounter (Signed)
Pt is scheduled for 3:30pm today.

## 2018-08-13 NOTE — Progress Notes (Signed)
Subjective:  Patient ID: Jane Davidson, female    DOB: [birth year]  Age: 77 y.o. MRN: [id number]  CC: The primary encounter diagnosis was Chest tightness. Diagnoses of Chest pain at rest, Essential hypertension, and Bug bite, initial encounter were also pertinent to this visit.  HPI Jane Davidson presents for elevated blood pressure readings   1) HTN: Home readings have been as  high as 419 systolic yesterday.  She notes that hr readings have been elevated , usually ain the range of 160 /90  For the last several weeks  Last dose adjustment was in February:  losartan dose was increased to 100 mg . bp was normal in September during her CPE . Has been takiing tylenol but no motrin, aleve, or meloxicam.    2)  worried about a spot  on her right buttocks  ,  Scratched it without looking.thinks it was a bug bite .  3)  had an episode of SSCP this morning that occurred after wakng,  Was relieved by burping and the chewing of  2 TUMS .  Got anxious last night with the storm      Outpatient Medications Prior to Visit  Medication Sig Dispense Refill  . ADVAIR DISKUS 100-50 MCG/DOSE AEPB INHALE 1 PUFF BY MOUTH TWICE DAILY 1 each 3  . augmented betamethasone dipropionate (DIPROLENE-AF) 0.05 % cream   1  . Cholecalciferol (VITAMIN D3) 1000 UNITS CAPS Take 1 capsule by mouth daily.    . clobetasol ointment (TEMOVATE) 6.22 % Apply 1 application topically 2 (two) times daily. 30 g 5  . fluticasone (FLONASE) 50 MCG/ACT nasal spray SHAKE LIQUID AND USE 2 SPRAYS IN EACH NOSTRIL DAILY 16 g 2  . loratadine (CLARITIN) 10 MG tablet Take 1 tablet (10 mg total) by mouth daily. 30 tablet 11  . losartan (COZAAR) 100 MG tablet TAKE 1 TABLET(100 MG) BY MOUTH DAILY 90 tablet 1  . sertraline (ZOLOFT) 50 MG tablet Take 1 tablet (50 mg total) by mouth daily. 90 tablet 1  . simvastatin (ZOCOR) 20 MG tablet TAKE 1 TABLET BY MOUTH DAILY 90 tablet 1  . SYNTHROID 100 MCG tablet TAKE 1 TABLET BY MOUTH EVERY DAY 90  tablet 1  . traMADol (ULTRAM) 50 MG tablet Take 1 tablet (50 mg total) by mouth every 6 (six) hours as needed. 120 tablet 0  . meloxicam (MOBIC) 15 MG tablet TAKE 1 TABLET(15 MG) BY MOUTH DAILY with food 90 tablet 0   No facility-administered medications prior to visit.     Review of Systems;  Patient denies headache, fevers, malaise, unintentional weight loss, skin rash, eye pain, sinus congestion and sinus pain, sore throat, dysphagia,  hemoptysis , cough, dyspnea, wheezing,  palpitations, orthopnea, edema, abdominal pain, nausea, melena, diarrhea, constipation, flank pain, dysuria, hematuria, urinary  Frequency, nocturia, numbness, tingling, seizures,  Focal weakness, Loss of consciousness,  Tremor, insomnia, depression, anxiety, and suicidal ideation.      Objective:  BP (!) 172/78 (BP Location: Left Arm, Patient Position: Sitting, Cuff Size: Normal)   Pulse (!) 108   Temp 98.7 F (37.1 C) (Oral)   Resp 15   Ht 5' 4.5" (1.638 m)   Wt 143 lb (64.9 kg)   SpO2 95%   BMI 24.17 kg/m   BP Readings from Last 3 Encounters:  08/13/18 (!) 172/78  06/15/18 112/62  03/01/18 134/64    Wt Readings from Last 3 Encounters:  08/13/18 143 lb (64.9 kg)  06/15/18 143 lb 6.4 oz (  65 kg)  03/01/18 146 lb 3.2 oz (66.3 kg)    General appearance: alert, cooperative and appears stated age Ears: normal TM's and external ear canals both ears Throat: lips, mucosa, and tongue normal; teeth and gums normal Neck: no adenopathy, no carotid bruit, supple, symmetrical, trachea midline and thyroid not enlarged, symmetric, no tenderness/mass/nodules Back: symmetric, no curvature. ROM normal. No CVA tenderness. Lungs: clear to auscultation bilaterally Heart: regular rate and rhythm, S1, S2 normal, no murmur, click, rub or gallop Abdomen: soft, non-tender; bowel sounds normal; no masses,  no organomegaly Pulses: 2+ and symmetric Skin: right buttock with a single excoriated papule  No erythema or vesicular  pattern.  Skin color, texture, turgor normal. No rashes or lesions Lymph nodes: Cervical, supraclavicular, and axillary nodes normal.  Lab Results  Component Value Date   HGBA1C 5.8 06/08/2018   HGBA1C 5.9 11/18/2017   HGBA1C 5.8 05/07/2017    Lab Results  Component Value Date   CREATININE 0.75 06/28/2018   CREATININE 0.86 06/15/2018   CREATININE 0.82 06/08/2018    Lab Results  Component Value Date   WBC 10.2 06/08/2018   HGB 12.6 06/08/2018   HCT 36.6 06/08/2018   PLT 304.0 06/08/2018   GLUCOSE 109 (H) 06/28/2018   CHOL 133 06/08/2018   TRIG 49.0 06/08/2018   HDL 68.10 06/08/2018   LDLDIRECT 69.0 05/07/2017   LDLCALC 55 06/08/2018   ALT 11 06/08/2018   AST 17 06/08/2018   NA 131 (L) 06/28/2018   K 4.3 06/28/2018   CL 101 06/28/2018   CREATININE 0.75 06/28/2018   BUN 14 06/28/2018   CO2 28 06/28/2018   TSH 1.70 06/08/2018   INR 1.0 11/03/2014   HGBA1C 5.8 06/08/2018   MICROALBUR <0.7 11/18/2017    Mr Lumbar Spine Wo Contrast  Result Date: 03/17/2018 CLINICAL DATA:  77 year old female with lumbar back pain radiating to the right upper leg. Symptoms for 2 months. EXAM: MRI LUMBAR SPINE WITHOUT CONTRAST TECHNIQUE: Multiplanar, multisequence MR imaging of the lumbar spine was performed. No intravenous contrast was administered. COMPARISON:  Lumbar MRI 07/18/2015. FINDINGS: Segmentation: Same lumbar numbering used on the prior MRI with vestigial S1-S2 disc space, otherwise fully sacralized S1 level. The lowest ribs appear to be at T12 by this numbering system. Correlation with radiographs is recommended prior to any operative intervention. Alignment: Chronic grade 2 spondylolisthesis at L5-S1 measuring 8 millimeters appears stable. Relatively preserved lordosis otherwise. Mild dextroconvex lumbar spine curvature is stable. Vertebrae: Mild degenerative endplate marrow edema posteriorly inferiorly at T12. No other No marrow edema or evidence of acute osseous abnormality. Intact  visible sacrum and SI joints. Conus medullaris and cauda equina: Conus extends to the L1-L2 level. No lower spinal cord or conus signal abnormality. Paraspinal and other soft tissues: Negative visible abdominal viscera. Moderate sigmoid diverticulosis in the pelvis. Posterior paraspinal soft tissues remarkable for multiple degenerative synovial cysts, greater on the right as detailed below. Disc levels: T11-T12: Mild chronic disc bulge and facet hypertrophy. Borderline to mild spinal stenosis. T12-L1: Mild disc bulge eccentric to the right. Mild facet hypertrophy. Mild right T12 foraminal stenosis. L1-L2: Chronic left eccentric disc bulge and endplate spurring. Moderate facet hypertrophy. Moderate left lateral recess stenosis (left L2 nerve level), and mild spinal stenosis have increased. Stable mild left L1 foraminal stenosis. L2-L3: Circumferential but mostly far lateral disc bulging. Moderate facet and ligament flavum hypertrophy. Chronic degenerative facet joint fluid. Stable mild to moderate spinal stenosis, left L2 foraminal stenosis, and moderate to severe right  L2 foraminal stenosis (series 2, image 5). L3-L4: Chronic circumferential, mostly far lateral disc bulging. Moderate facet and ligament flavum hypertrophy. Stable mild spinal and moderate to severe bilateral L3 neural foraminal stenosis. L4-L5: Chronic circumferential disc bulge with broad-based posterior and right greater than left foraminal involvement. Chronic severe facet hypertrophy. Chronic degenerative facet joint fluid. Progressed mostly right side degenerative synovial cysts since 2016. Most of the cysts are posteriorly situated and/or subligamentous and should not result in neural compromise. Progressed moderate to severe spinal and right lateral recess stenosis (right L5 nerve level). Moderate left L4 foraminal stenosis appears not significantly changed. There is moderate to severe right L4 neural foraminal stenosis which is chronic,  although there is now a small degenerative synovial cyst in proximity to the exiting right L4 nerve seen on series 5, image 28. L5-S1: Chronic anterolisthesis with disc/pseudo disc bulge and chronic severe facet degeneration. Numerous increased bilateral right greater than left posteriorly situated synovial cysts (series 5, images 33 and 34) which should not result in neural compromise currently. No spinal stenosis. Borderline to mild bilateral lateral recess stenosis at the S1 nerve levels is stable. Mild to moderate bilateral L5 foraminal stenosis is stable. S1-S2: Vestigial disc, otherwise normal. IMPRESSION: 1. Moderate and severe chronic lumbar facet arthropathy L2-L3 through L5-S1. Chronic grade 2 spondylolisthesis at the latter. 2. Progressed facet related degenerative synovial cysts at L4-L5 and L5-S1 since 2016, most of which are posteriorly situated and should not result in neural compromise, but there is a small cyst in proximity of the right L4-L5 neural foramen: Query right L4 radiculitis. 3. Additionally, moderate to severe spinal and right lateral recess stenosis has also progressed at L4-L5 since 2016: Query right L5 radiculitis. 4. Stable mild to moderate spinal and moderate to severe neural foraminal stenosis at L2-L3 and L3-L4. 5. Increased mild spinal and moderate left lateral recess stenosis at L1-L2. Electronically Signed   By: Genevie Ann M.D.   On: 03/17/2018 11:48    Assessment & Plan:   Problem List Items Addressed This Visit    Bug bite    Presumed,  On right buttock.  No signs of infection.  Reassurance provided       Chest pain at rest    Atypical,  Occurred this morning, and was relieved with TUMs. I have ordered and reviewed a 12 lead EKG and find that there are no acute changes and patient is in sinus rhythm.  .  However she does have risk factors,  So NTG was given to use if next occurrence did not improve with TUMS      Essential hypertension    Elevated for several weeks  with no recent medication change,  Continue ARB, adding metoprolol XL 25 mg daily at bedtine.       Relevant Medications   metoprolol succinate (TOPROL-XL) 25 MG 24 hr tablet   nitroGLYCERIN (NITROSTAT) 0.4 MG SL tablet    Other Visit Diagnoses    Chest tightness    -  Primary   Relevant Orders   EKG 12-Lead (Completed)   EKG 12-Lead (Completed)     A total of 25 minutes of face to face time was spent with patient more than half of which was spent in counselling about the above mentioned conditions  and coordination of care   I have discontinued Leda Gauze D. Bebee's meloxicam. I am also having her start on metoprolol succinate and nitroGLYCERIN. Additionally, I am having her maintain her Vitamin D3, clobetasol ointment, sertraline,  loratadine, ADVAIR DISKUS, traMADol, losartan, simvastatin, fluticasone, SYNTHROID, and augmented betamethasone dipropionate.  Meds ordered this encounter  Medications  . metoprolol succinate (TOPROL-XL) 25 MG 24 hr tablet    Sig: Take 1 tablet (25 mg total) by mouth daily.    Dispense:  90 tablet    Refill:  3  . nitroGLYCERIN (NITROSTAT) 0.4 MG SL tablet    Sig: Place 1 tablet (0.4 mg total) under the tongue every 5 (five) minutes as needed for chest pain.    Dispense:  50 tablet    Refill:  3    Medications Discontinued During This Encounter  Medication Reason  . meloxicam (MOBIC) 15 MG tablet     Follow-up: Return in about 4 weeks (around 09/10/2018).   Crecencio Mc, MD

## 2018-08-15 DIAGNOSIS — W57XXXA Bitten or stung by nonvenomous insect and other nonvenomous arthropods, initial encounter: Secondary | ICD-10-CM | POA: Insufficient documentation

## 2018-08-15 DIAGNOSIS — R079 Chest pain, unspecified: Secondary | ICD-10-CM | POA: Insufficient documentation

## 2018-08-15 NOTE — Assessment & Plan Note (Addendum)
Atypical,  Occurred this morning, and was relieved with TUMs. I have ordered and reviewed a 12 lead EKG and find that there are no acute changes and patient is in sinus rhythm.  .  However she does have risk factors,  So NTG was given to use if next occurrence did not improve with [facility]

## 2018-08-15 NOTE — Assessment & Plan Note (Signed)
Presumed,  On right buttock.  No signs of infection.  Reassurance provided

## 2018-08-15 NOTE — Assessment & Plan Note (Signed)
Elevated for several weeks with no recent medication change,  Continue ARB, adding metoprolol XL 25 mg daily at bedtine.

## 2018-08-16 ENCOUNTER — Ambulatory Visit: Payer: PPO

## 2018-08-16 ENCOUNTER — Ambulatory Visit
Admission: RE | Admit: 2018-08-16 | Discharge: 2018-08-16 | Disposition: A | Discharge status: Home / Self Care | Payer: PPO | Source: Ambulatory Visit | Attending: Oncology | Admitting: Oncology

## 2018-08-16 DIAGNOSIS — I700 Atherosclerosis of aorta: Secondary | ICD-10-CM | POA: Diagnosis not present

## 2018-08-16 DIAGNOSIS — Z122 Encounter for screening for malignant neoplasm of respiratory organs: Secondary | ICD-10-CM | POA: Diagnosis not present

## 2018-08-16 DIAGNOSIS — Z87891 Personal history of nicotine dependence: Secondary | ICD-10-CM | POA: Insufficient documentation

## 2018-08-16 DIAGNOSIS — J439 Emphysema, unspecified: Secondary | ICD-10-CM | POA: Diagnosis not present

## 2018-08-16 DIAGNOSIS — F17210 Nicotine dependence, cigarettes, uncomplicated: Secondary | ICD-10-CM | POA: Diagnosis not present

## 2018-08-17 ENCOUNTER — Encounter: Payer: Self-pay | Admitting: *Deleted

## 2018-08-22 ENCOUNTER — Telehealth: Payer: Self-pay | Admitting: Internal Medicine

## 2018-08-22 NOTE — Telephone Encounter (Signed)
Your home blood pressure readings seem to be improving.  No changes to medication regimen for now  Continue checking once daily

## 2018-08-27 ENCOUNTER — Ambulatory Visit (INDEPENDENT_AMBULATORY_CARE_PROVIDER_SITE_OTHER): Payer: PPO | Admitting: Family Medicine

## 2018-08-27 ENCOUNTER — Encounter: Payer: Self-pay | Admitting: Family Medicine

## 2018-08-27 VITALS — BP 160/88 | HR 77 | Temp 97.8°F | Ht 64.5 in | Wt 143.0 lb

## 2018-08-27 DIAGNOSIS — R300 Dysuria: Secondary | ICD-10-CM | POA: Diagnosis not present

## 2018-08-27 DIAGNOSIS — N390 Urinary tract infection, site not specified: Secondary | ICD-10-CM

## 2018-08-27 LAB — POCT URINALYSIS DIPSTICK
Bilirubin, UA: NEGATIVE
Blood, UA: NEGATIVE
Glucose, UA: NEGATIVE
KETONES UA: NEGATIVE
LEUKOCYTES UA: NEGATIVE
Nitrite, UA: NEGATIVE
PH UA: 6 (ref 5.0–8.0)
Protein, UA: NEGATIVE
SPEC GRAV UA: 1.02 (ref 1.010–1.025)
UROBILINOGEN UA: 0.2 U/dL

## 2018-08-27 MED ORDER — PHENAZOPYRIDINE HCL 100 MG PO TABS: 100.0000 mg | ORAL_TABLET | Freq: Three times a day (TID) | ORAL | 0 refills | Status: DC | PRN

## 2018-08-27 NOTE — Patient Instructions (Signed)
Your urinalysis looks clear for any signs of infection or abnormality.  We will have you take Pyridium up to 3 times a day as needed for bladder spasm, this medication can turn your urine orange.  Urine culture collected and sent to lab, if this does grow bacteria we will contact you and let you know and treat accordingly. Increase water intake.  You can use topical muscle rub or topical patches such as BenGay to help right-sided low back pain.  Do stretches to help improve pain.

## 2018-08-27 NOTE — Progress Notes (Signed)
Subjective:    Patient ID: Jane Davidson, female    DOB: 08-14-1941, 77 y.o.   MRN: [id number]  HPI   Presents to clinic c/o burning with urinating more so at night and right low back pain for 1-2 days.   Patient does report she has eaten more sugar than usual over the past couple of days.  Also did lift a heavy item 2 days ago, usually does not lift heavy things.  Denies any abdominal pain, nausea, vomiting or diarrhea.  Denies fever or chills.  Denies any lower extremity numbness, saddle anesthesia, loss of bowel or bladder control.  Patient Active Problem List   Diagnosis Date Noted  . Chest pain at rest 08/15/2018  . Bug bite 08/15/2018  . Hyponatremia 06/11/2018  . Osteoarthritis of spine 03/01/2018  . Chronic low back pain without sciatica 03/01/2018  . Spinal stenosis of lumbosacral region 03/01/2018  . Lumbar herniated disc 03/01/2018  . Cardiac murmur 03/01/2018  . Nausea 03/01/2018  . Rhinorrhea 09/25/2017  . Dysuria 06/10/2017  . Atopic dermatitis 04/22/2017  . Carotid stenosis 08/29/2016  . Personal history of tobacco use, presenting hazards to health 06/18/2016  . Essential hypertension 02/29/2016  . Diabetes mellitus without complication ([place]) 12/87/8676  . Right hip pain 04/01/2015  . Onychomycosis of left great toe 04/01/2015  . Constipation 06/08/2014  . Anemia 09/27/2013  . Encounter for preventive health examination 03/27/2013  . Screening for colon cancer 10/16/2011  . Tobacco abuse counseling 08/09/2011  . History of peripheral vascular disease   . Bursitis, ischial   . Osteopenia   . Tobacco abuse disorder   . Vitamin D deficiency   . Arrhythmia 07/05/2011  . Hypothyroidism 07/05/2011  . 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 COPD (chronic obstructive pulmonary disease) ([place])   . Hyperlipidemia   . Screening for breast cancer 02/11/2011   Social History   Tobacco Use  . Smoking status: Current Every Day Smoker   Packs/day: 1.04    Years: 40.00    Pack years: 41.60    Types: Cigarettes  . Smokeless tobacco: Never Used  Substance Use Topics  . Alcohol use: No   Review of Systems  Constitutional: Negative for chills, fatigue and fever.  HENT: Negative for congestion, ear pain, sinus pain and sore throat.   Eyes: Negative.   Respiratory: Negative for cough, shortness of breath and wheezing.   Cardiovascular: Negative for chest pain, palpitations and leg swelling.  Gastrointestinal: Negative for abdominal pain, diarrhea, nausea and vomiting.  Genitourinary: +dysuria, frequency - more so at night Musculoskeletal: +right sided low back pain Skin: Negative for color change, pallor and rash.  Neurological: Negative for syncope, light-headedness and headaches.  Psychiatric/Behavioral: The patient is not nervous/anxious.       Objective:   Physical Exam  Constitutional: She is oriented to person, place, and time. No distress.  HENT:  Head: Normocephalic and atraumatic.  Eyes: Conjunctivae and EOM are normal. No scleral icterus.  Neck: Neck supple. No tracheal deviation present.  Cardiovascular: Normal rate and regular rhythm.  Pulmonary/Chest: Effort normal and breath sounds normal.  Abdominal: Soft. Bowel sounds are normal. There is tenderness (mild suprapubic tenderness).  No CVA tenderness  Musculoskeletal: She exhibits no edema.       Back:  Area of tenderness on right low back indicated by purple circle on diagram.  Right paraspinal muscles do feel tense with palpation.  Neurological: She is alert and oriented to person,  place, and time.  Skin: Skin is warm and dry. She is not diaphoretic. No pallor.  Psychiatric: She has a normal mood and affect. Her behavior is normal.  Nursing note and vitals reviewed.     Vitals:   08/27/18 1519  BP: (!) 160/88  Pulse: 77  Temp: 97.8 F (36.6 C)  SpO2: 93%   Assessment & Plan:    A total of 25  minutes were spent face-to-face with the  patient during this encounter and over half of that time was spent on counseling and coordination of care. The patient was counseled on dysuria causes, location of kidneys in our anatomy, how to treat low back pain, reassurance we are ruling out UTI with urine culture.   Dysuria - patient's urinalysis is unremarkable for any abnormality.  We will send urine cultures to the lab to be sure no bacteria grows.  Patient will take Pyridium as needed for bladder spasms.  Advised to increase fluid intake, decrease sugar and caffeine intake.  Patient is aware that Pyridium can turn urine orange and has been advised to not be alarmed by this.  Patient will be called with results of urine culture and if UTI is present we will treat accordingly  Right-sided low back pain - pain appears to be musculoskeletal in nature.  Discussed different options that can help improve pain such as taking Tylenol coming doing low back stretches and topical muscle rub and/or BenGay/lidocaine patch.  Patient states she does have a pain relief patch at home and she will put this on her right low back.  Keep regularly scheduled follow-up as planned.  Return to clinic sooner if any issues arise.

## 2018-08-29 LAB — URINE CULTURE
MICRO NUMBER:: 91378981
SPECIMEN QUALITY:: ADEQUATE

## 2018-08-30 MED ORDER — SULFAMETHOXAZOLE-TRIMETHOPRIM 800-160 MG PO TABS: 1.0000 | ORAL_TABLET | Freq: Two times a day (BID) | ORAL | 0 refills | Status: AC

## 2018-08-30 NOTE — Addendum Note (Signed)
Addended by: Philis Nettle on: 08/30/2018 08:04 AM   Modules accepted: Orders

## 2018-08-31 IMAGING — MR MR LUMBAR SPINE W/O CM
4 of 5 series · 24 of 48 positions shown · non-contrast
Comparison: Lumbar MRI 07/18/2015.

CLINICAL DATA: 76-year-old female with lumbar back pain radiating
to the right upper leg. Symptoms for 2 months.

EXAM:
MRI LUMBAR SPINE WITHOUT CONTRAST
TECHNIQUE: Multiplanar, multisequence MR imaging of the lumbar spine was
performed. No intravenous contrast was administered.

[Series 2: T2 · sagittal · 4.0mm · 0.81mm/px · 6 of 17 slices shown (1 of 2)]
[im 1/17]
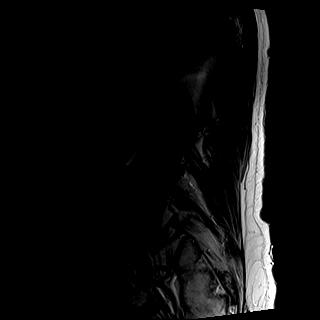
[im 4/17]
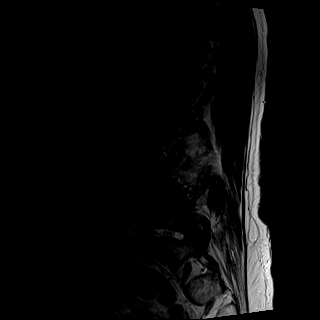
[im 7/17]
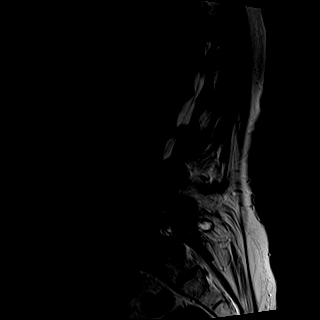
[im 10/17]
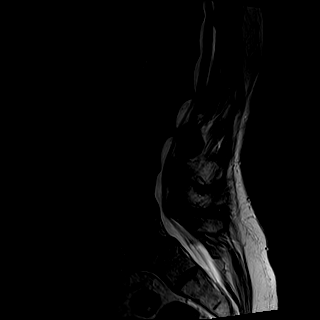
[im 13/17]
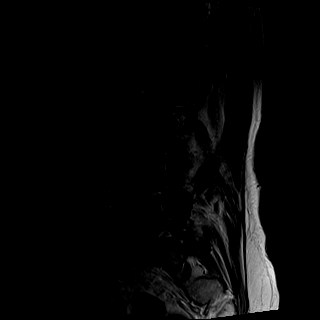
[im 17/17]
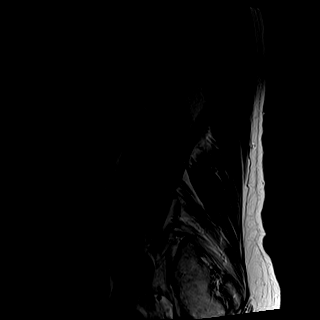

[Series 3: T1 · sagittal · 4.0mm · 0.41mm/px · 6 of 17 slices shown (1 of 2)]
[im 1/17]
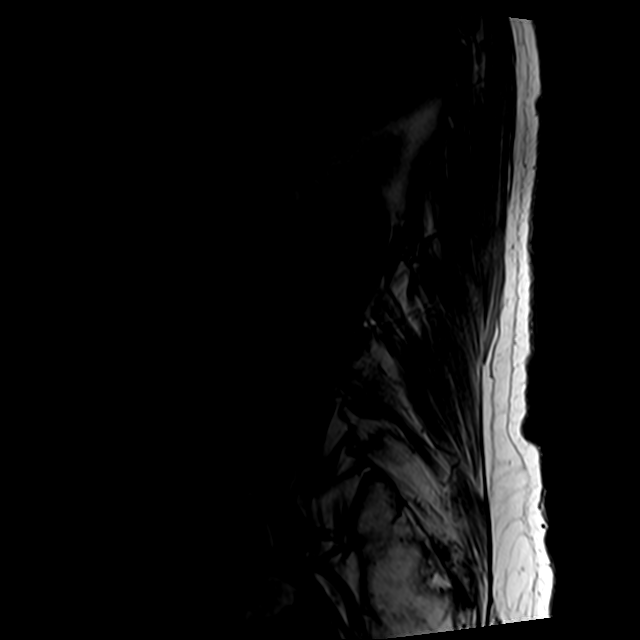
[im 4/17]
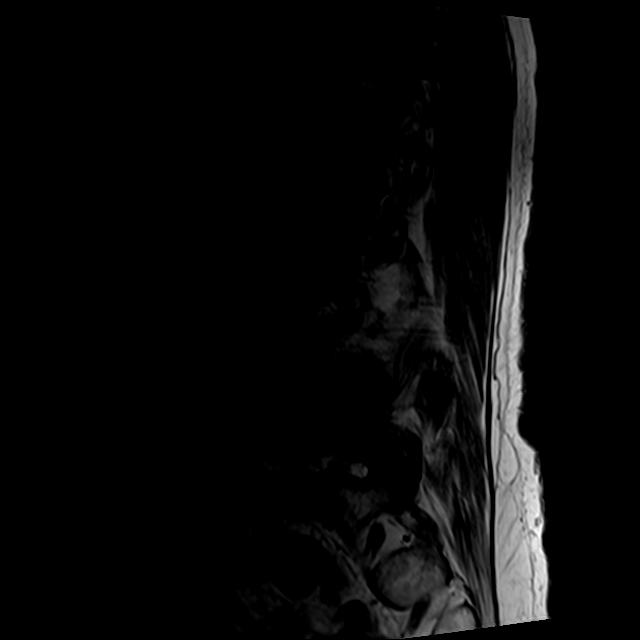
[im 7/17]
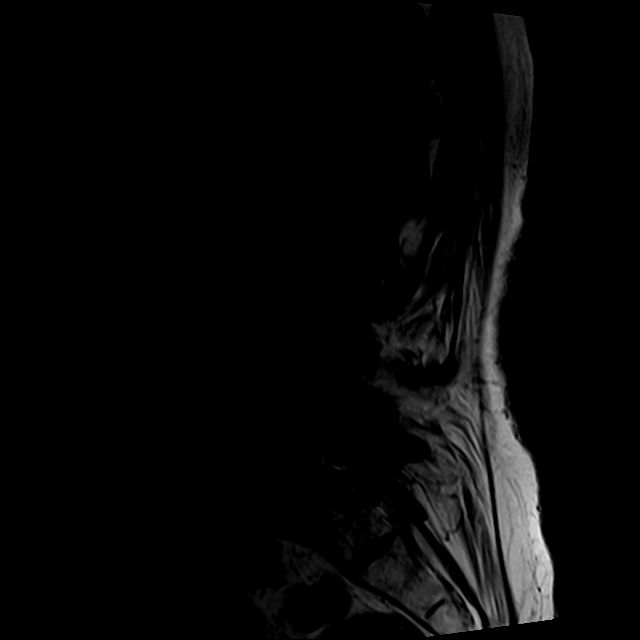
[im 10/17]
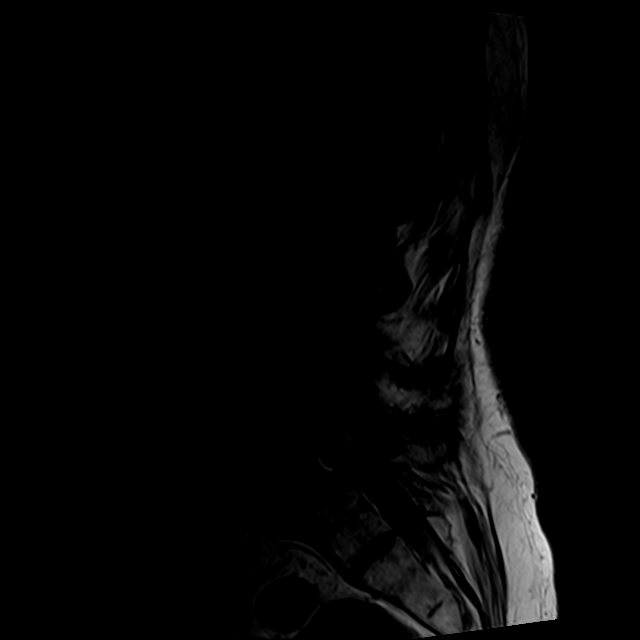
[im 13/17]
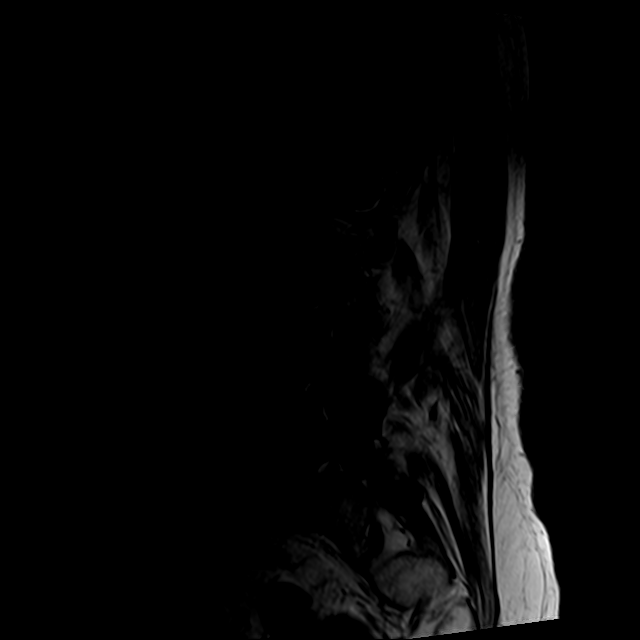
[im 17/17]
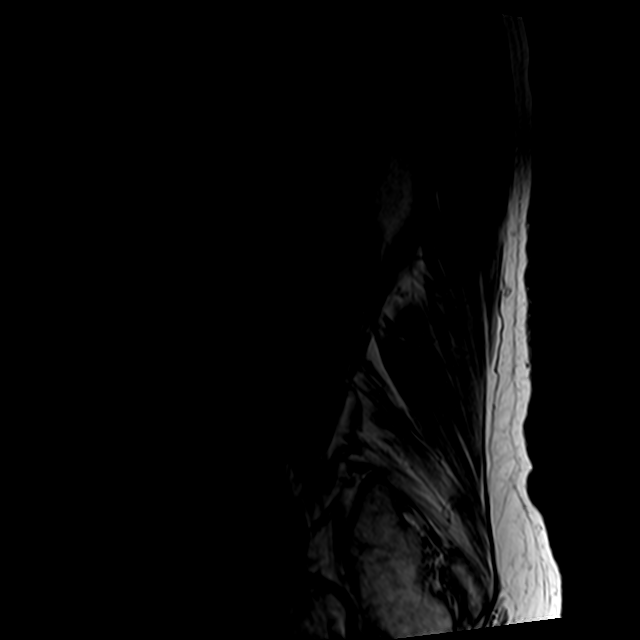

[Series 5: T2 · axial · 4.0mm · 0.78mm/px · z∈[-36,+200]mm · 9 of 42 slices shown (2 of 2)]
[im 1/42]
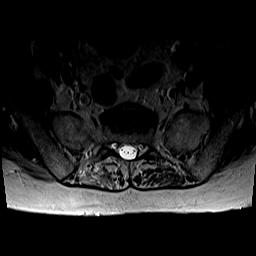
[im 6/42]
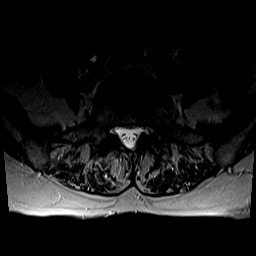
[im 12/42]
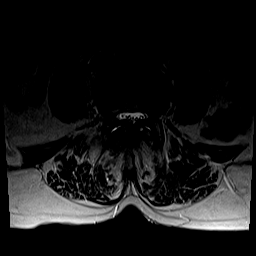
[im 18/42]
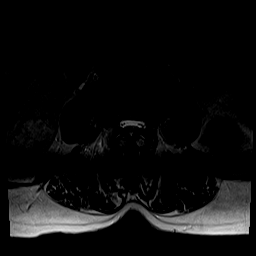
[im 21/42]
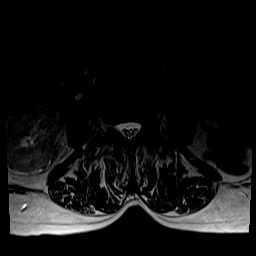
[im 24/42]
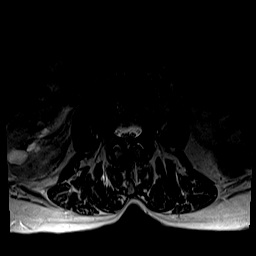
[im 30/42]
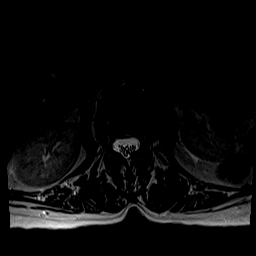
[im 36/42]
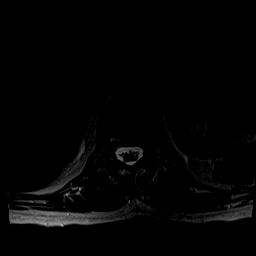
[im 42/42]
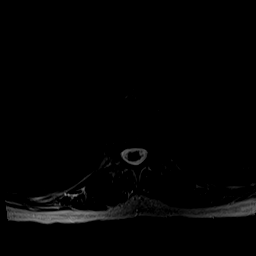

[Series 6: T1 · axial · 4.0mm · 0.39mm/px · z∈[-11,+170]mm · 3 of 42 slices shown (2 of 2)]
[im 6/42]
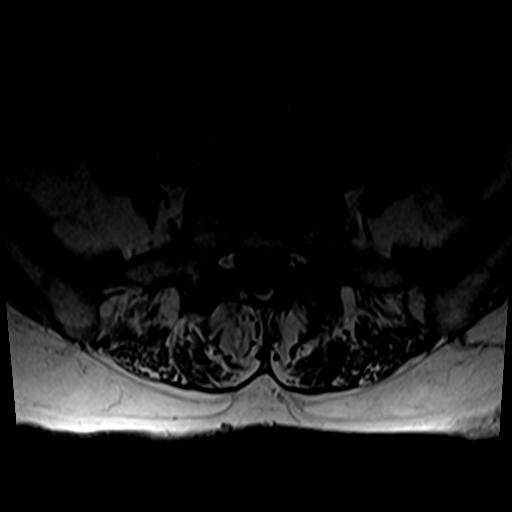
[im 21/42]
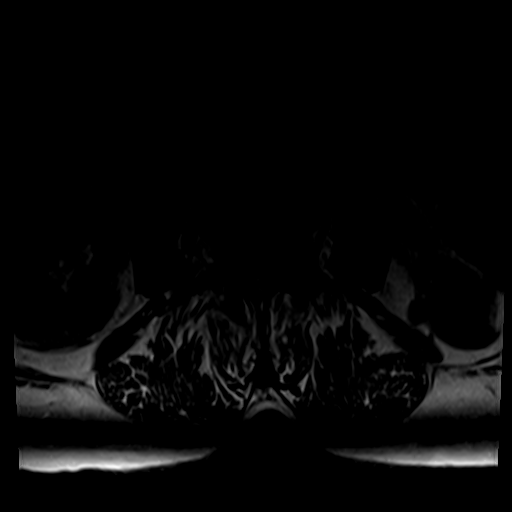
[im 36/42]
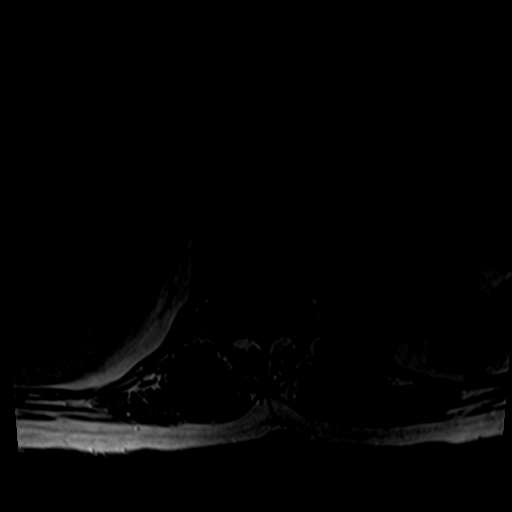

[24 of 48 positions shown; findings below may reference images not displayed]

FINDINGS: Segmentation: Same lumbar numbering used on the prior MRI with
vestigial S1-S2 disc space, otherwise fully sacralized S1 level. The
lowest ribs appear to be at T12 by this numbering system.
Correlation with radiographs is recommended prior to any operative
intervention.

Alignment: Chronic grade 2 spondylolisthesis at L5-S1 measuring 8
millimeters appears stable. Relatively preserved lordosis otherwise.
Mild dextroconvex lumbar spine curvature is stable.

Vertebrae: Mild degenerative endplate marrow edema posteriorly
inferiorly at T12. No other No marrow edema or evidence of acute
osseous abnormality. Intact visible sacrum and SI joints.

Conus medullaris and cauda equina: Conus extends to the L1-L2 level.
No lower spinal cord or conus signal abnormality.

Paraspinal and other soft tissues: Negative visible abdominal
viscera. Moderate sigmoid diverticulosis in the pelvis. Posterior
paraspinal soft tissues remarkable for multiple degenerative
synovial cysts, greater on the right as detailed below.

Disc levels:

T11-T12: Mild chronic disc bulge and facet hypertrophy. Borderline
to mild spinal stenosis.

T12-L1: Mild disc bulge eccentric to the right. Mild facet
hypertrophy. Mild right T12 foraminal stenosis.

L1-L2: Chronic left eccentric disc bulge and endplate spurring.
Moderate facet hypertrophy. Moderate left lateral recess stenosis
(left L2 nerve level), and mild spinal stenosis have increased.
Stable mild left L1 foraminal stenosis.

L2-L3: Circumferential but mostly far lateral disc bulging. Moderate
facet and ligament flavum hypertrophy. Chronic degenerative facet
joint fluid. Stable mild to moderate spinal stenosis, left L2
foraminal stenosis, and moderate to severe right L2 foraminal
stenosis (series 2, image 5).

L3-L4: Chronic circumferential, mostly far lateral disc bulging.
Moderate facet and ligament flavum hypertrophy. Stable mild spinal
and moderate to severe bilateral L3 neural foraminal stenosis.

L4-L5: Chronic circumferential disc bulge with broad-based posterior
and right greater than left foraminal involvement. Chronic severe
facet hypertrophy. Chronic degenerative facet joint fluid.
Progressed mostly right side degenerative synovial cysts since 4244.
Most of the cysts are posteriorly situated and/or subligamentous and
should not result in neural compromise. Progressed moderate to
severe spinal and right lateral recess stenosis (right L5 nerve
level). Moderate left L4 foraminal stenosis appears not
significantly changed. There is moderate to severe right L4 neural
foraminal stenosis which is chronic, although there is now a small
degenerative synovial cyst in proximity to the exiting right L4
nerve seen on series 5, image 28.

L5-S1: Chronic anterolisthesis with disc/pseudo disc bulge and
chronic severe facet degeneration. Numerous increased bilateral
right greater than left posteriorly situated synovial cysts (series
5, images 33 and 34) which should not result in neural compromise
currently. No spinal stenosis. Borderline to mild bilateral lateral
recess stenosis at the S1 nerve levels is stable. Mild to moderate
bilateral L5 foraminal stenosis is stable.

S1-S2: Vestigial disc, otherwise normal.
IMPRESSION: 1. Moderate and severe chronic lumbar facet arthropathy L2-L3
through L5-S1. Chronic grade 2 spondylolisthesis at the latter.
2. Progressed facet related degenerative synovial cysts at L4-L5 and
L5-S1 since 4244, most of which are posteriorly situated and should
not result in neural compromise, but there is a small cyst in
proximity of the right L4-L5 neural foramen: Query right L4
radiculitis.
3. Additionally, moderate to severe spinal and right lateral recess
stenosis has also progressed at L4-L5 since 4244: Query right L5
radiculitis.
4. Stable mild to moderate spinal and moderate to severe neural
foraminal stenosis at L2-L3 and L3-L4.
5. Increased mild spinal and moderate left lateral recess stenosis
at L1-L2.

## 2018-09-01 ENCOUNTER — Other Ambulatory Visit: Payer: Self-pay | Admitting: Internal Medicine

## 2018-09-07 ENCOUNTER — Other Ambulatory Visit: Payer: Self-pay | Admitting: Internal Medicine

## 2018-09-07 ENCOUNTER — Ambulatory Visit (INDEPENDENT_AMBULATORY_CARE_PROVIDER_SITE_OTHER): Payer: PPO | Admitting: Vascular Surgery

## 2018-09-07 ENCOUNTER — Ambulatory Visit (INDEPENDENT_AMBULATORY_CARE_PROVIDER_SITE_OTHER): Payer: PPO

## 2018-09-07 ENCOUNTER — Encounter (INDEPENDENT_AMBULATORY_CARE_PROVIDER_SITE_OTHER): Payer: Self-pay

## 2018-09-07 DIAGNOSIS — I6523 Occlusion and stenosis of bilateral carotid arteries: Secondary | ICD-10-CM | POA: Diagnosis not present

## 2018-09-13 ENCOUNTER — Encounter (INDEPENDENT_AMBULATORY_CARE_PROVIDER_SITE_OTHER): Payer: Self-pay | Admitting: Vascular Surgery

## 2018-09-13 ENCOUNTER — Other Ambulatory Visit (INDEPENDENT_AMBULATORY_CARE_PROVIDER_SITE_OTHER): Payer: Self-pay | Admitting: Vascular Surgery

## 2018-09-13 DIAGNOSIS — I6523 Occlusion and stenosis of bilateral carotid arteries: Secondary | ICD-10-CM

## 2018-09-14 ENCOUNTER — Telehealth: Payer: Self-pay

## 2018-09-14 NOTE — Telephone Encounter (Signed)
Refilled: 02/25/2018 Last OV: 08/13/2018 Next OV: 10/08/2018

## 2018-09-15 MED ORDER — TRAMADOL HCL 50 MG PO TABS: 50.0000 mg | ORAL_TABLET | Freq: Four times a day (QID) | ORAL | 0 refills | Status: DC | PRN

## 2018-10-07 ENCOUNTER — Ambulatory Visit (INDEPENDENT_AMBULATORY_CARE_PROVIDER_SITE_OTHER): Payer: PPO

## 2018-10-07 VITALS — BP 142/78 | HR 71 | Temp 98.0°F | Resp 16 | Ht 64.0 in | Wt 145.0 lb

## 2018-10-07 DIAGNOSIS — Z0000 Encounter for general adult medical examination without abnormal findings: Secondary | ICD-10-CM | POA: Diagnosis not present

## 2018-10-07 NOTE — Patient Instructions (Addendum)
  Ms. Vohs , Thank you for taking time to come for your Medicare Wellness Visit. I appreciate your ongoing commitment to your health goals. Please review the following plan we discussed and let me know if I can assist you in the future.   These are the goals we discussed: Goals      Patient Stated   . Quit Smoking (pt-stated)     Not ready to stop smoking today, but will try.        This is a list of the screening recommended for you and due dates:  Health Maintenance  Topic Date Due  . Mammogram  10/16/2018  . Eye exam for diabetics  10/22/2018  . Hemoglobin A1C  12/09/2018  . Complete foot exam   06/16/2019  . Tetanus Vaccine  10/15/2021  . Flu Shot  Completed  . DEXA scan (bone density measurement)  Completed  . Pneumonia vaccines  Completed

## 2018-10-07 NOTE — Progress Notes (Signed)
Agree with below   TMS 

## 2018-10-07 NOTE — Progress Notes (Signed)
Subjective:   Jane Davidson is a 77 y.o. female who presents for Medicare Annual (Subsequent) preventive examination.  Review of Systems:  No ROS.  Medicare Wellness Visit. Additional risk factors are reflected in the social history. Cardiac Risk Factors include: advanced age (>42men, >9 women);diabetes mellitus;hypertension;smoking/ tobacco exposure     Objective:     Vitals: BP (!) 142/78 (BP Location: Left Arm, Patient Position: Sitting, Cuff Size: Normal)   Pulse 71   Temp 98 F (36.7 C) (Oral)   Resp 16   Ht 5\' 4"  (1.626 m)   Wt 145 lb (65.8 kg)   SpO2 95%   BMI 24.89 kg/m   Body mass index is 24.89 kg/m.  Advanced Directives 10/07/2018 10/07/2017 07/07/2017 12/06/2016 10/03/2016 08/29/2016 10/09/2015  Does Patient Have a Medical Advance Directive? Yes Yes Yes Yes Yes Yes Yes  Type of Paramedic of [place];Living will [place];Living will Living will;Healthcare Power of Attorney Living will [place];Living will [place];Living will [place];Living will  Does patient want to make changes to medical advance directive? No - Patient declined - - - No - Patient declined - No - Patient declined  Copy of [place] in Chart? No - copy requested No - copy requested - - No - copy requested - No - copy requested    Tobacco Social History   Tobacco Use  Smoking Status Current Every Day Smoker  . Packs/day: 1.04  . Years: 40.00  . Pack years: 41.60  . Types: Cigarettes  Smokeless Tobacco Never Used     Ready to quit: Not Answered Counseling given: Not Answered   Clinical Intake:  Pre-visit preparation completed: Yes  Pain : No/denies pain     Diabetes: No  How often do you need to have someone help you when you read instructions, pamphlets, or other written materials from your doctor or pharmacy?: 1 - Never  Interpreter Needed?: No      Past Medical History:  Diagnosis Date  . Abnormal Pap smear of cervix 2001   Biopsy normal  . Bursitis NEC   . COPD (chronic obstructive pulmonary disease) ([place])   . History of peripheral vascular disease 2001   s/p bilateral aorto-iliac-femoral bypass /[place]  . Hyperlipidemia   . hypothyroid   . Osteopenia 2009    T scores - 1.5 DEXA 2009  . Screening for breast cancer May 2012   [place] mammogram  . Tobacco abuse disorder   . Vitamin D deficiency April 2011   replaced withDrsido for level of 11.3 ng/ml  . Vitamin D deficiency April 2011   replaced withDrsido for level of 11.3 ng/ml   Past Surgical History:  Procedure Laterality Date  . BREAST BIOPSY Left    benign  . CAROTID ENDARTERECTOMY Right 2016   Leotis Pain  . CATARACT EXTRACTION  Sept 2011  . CHOLECYSTECTOMY    . HERNIA REPAIR  Jun 2011   [name]   . TUBAL LIGATION     Family History  Problem Relation Age of Onset  . Hypertension Mother   . Arthritis Mother   . Diabetes Father   . Dementia Sister    Social History   Socioeconomic History  . Marital status: Widowed    Spouse name: Not on file  . Number of children: Not on file  . Years of education: Not on file  . Highest education level: Not on file  Occupational History  . Not on file  Social Needs  . Financial resource strain: Not hard at all  . Food insecurity:    Worry: Never true    Inability: Never true  . Transportation needs:    Medical: No    Non-medical: No  Tobacco Use  . Smoking status: Current Every Day Smoker    Packs/day: 1.04    Years: 40.00    Pack years: 41.60    Types: Cigarettes  . Smokeless tobacco: Never Used  Substance and Sexual Activity  . Alcohol use: No  . Drug use: No  . Sexual activity: Never  Lifestyle  . Physical activity:    Days per week: Not on file    Minutes per session: Not on file  . Stress: Not at all  Relationships  . Social connections:    Talks on phone: Not on file    Gets together:  Not on file    Attends religious service: Not on file    Active member of club or organization: Not on file    Attends meetings of clubs or organizations: Not on file    Relationship status: Not on file  Other Topics Concern  . Not on file  Social History Narrative  . Not on file    Outpatient Encounter Medications as of 10/07/2018  Medication Sig  . ADVAIR DISKUS 100-50 MCG/DOSE AEPB INHALE 1 PUFF BY MOUTH TWICE DAILY  . augmented betamethasone dipropionate (DIPROLENE-AF) 0.05 % cream   . Cholecalciferol (VITAMIN D3) 1000 UNITS CAPS Take 1 capsule by mouth daily.  . clobetasol ointment (TEMOVATE) 9.62 % Apply 1 application topically 2 (two) times daily.  . fluticasone (FLONASE) 50 MCG/ACT nasal spray SHAKE LIQUID AND USE 2 SPRAYS IN EACH NOSTRIL DAILY  . loratadine (CLARITIN) 10 MG tablet Take 1 tablet (10 mg total) by mouth daily.  Marland Kitchen losartan (COZAAR) 100 MG tablet TAKE 1 TABLET(100 MG) BY MOUTH DAILY  . metoprolol succinate (TOPROL-XL) 25 MG 24 hr tablet Take 1 tablet (25 mg total) by mouth daily.  . nitroGLYCERIN (NITROSTAT) 0.4 MG SL tablet Place 1 tablet (0.4 mg total) under the tongue every 5 (five) minutes as needed for chest pain.  . phenazopyridine (PYRIDIUM) 100 MG tablet Take 1 tablet (100 mg total) by mouth 3 (three) times daily as needed for pain.  Marland Kitchen sertraline (ZOLOFT) 50 MG tablet Take 1 tablet (50 mg total) by mouth daily.  . simvastatin (ZOCOR) 20 MG tablet TAKE 1 TABLET BY MOUTH DAILY  . SYNTHROID 100 MCG tablet TAKE 1 TABLET BY MOUTH EVERY DAY  . traMADol (ULTRAM) 50 MG tablet Take 1 tablet (50 mg total) by mouth every 6 (six) hours as needed.   No facility-administered encounter medications on file as of 10/07/2018.     Activities of Daily Living In your present state of health, do you have any difficulty performing the following activities: 10/07/2018 10/07/2017  Hearing? N N  Vision? N N  Difficulty concentrating or making decisions? N N  Walking or climbing  stairs? N N  Dressing or bathing? N N  Doing errands, shopping? N N  Preparing Food and eating ? N N  Using the Toilet? N N  In the past six months, have you accidently leaked urine? N N  Do you have problems with loss of bowel control? N N  Managing your Medications? N N  Managing your Finances? N N  Housekeeping or managing your Housekeeping? N N  Some recent data might  be hidden    Patient Care Team: Crecencio Mc, MD as PCP - General (Internal Medicine)    Assessment:   This is a routine wellness examination for [place].  Health Screenings  Mammogram -10/16/2017 Colonoscopy - 05/21/2010 Bone Density -10/15/2016 Glaucoma- none reported Hearing-passes the whisper test Hemoglobin A1C- 06/08/2018 (5.8) Cholesterol- 06/08/18 (133) Dental-UTD Vision-next due 10/15/2018  Social  Alcohol intake-none Smoking history- current Smokers in home? self Illicit drug use? none Exercise-walking, 3 days, 10 min Diet- moderate  Sexually [place]  Patient feels safe at home.  Patient does have smoke detectors at home  Patient does wear sunscreen or protective clothing when in direct sunlight. Patient does wear seat belt when driving or riding with others.  Activities of Daily Living Patient can do their own household chores. Denies needing assistance with: driving, feeding themselves, getting from bed to chair, getting to the toilet, bathing/showering, dressing, managing money, climbing flight of stairs, or preparing meals.   Depression Screen Patient denies losing interest in daily life, feeling hopeless, or crying easily over simple problems.   Fall Screen Patient denies being afraid of falling or falling in the last year.   Memory Screen Patient denies problems with memory, misplacing items, and is able to balance checkbook/bank accounts.  Patient is alert, normal appearance, oriented to person/place/and time. Correctly identified the president of the [place], recall of 2/3  objects, and performing simple calculations.  Patient displays appropriate judgement and can read correct time from watch face.   Immunizations The following Immunizations are up to date: Influenza, shingles, pneumonia, and tetanus.   Health maintenance closed.   Other Providers Patient Care Team: Crecencio Mc, MD as PCP - General (Internal Medicine)  Exercise Activities and Dietary recommendations Current Exercise Habits: Home exercise routine, Type of exercise: treadmill;walking, Time (Minutes): 10, Frequency (Times/Week): 3, Weekly Exercise (Minutes/Week): 30, Intensity: Mild  Goals      Patient Stated   . Quit Smoking (pt-stated)     Not ready to stop smoking today, but will try.        Fall Risk Fall Risk  10/07/2018 03/01/2018 10/07/2017 04/22/2017 10/03/2016  Falls in the past year? 0 No No No No  Number falls in past yr: - - - - -  Injury with Fall? - - - - -  Follow up - - - - -   Depression Screen PHQ 2/9 Scores 10/07/2018 03/01/2018 10/07/2017 04/22/2017  PHQ - 2 Score 0 0 0 0  PHQ- 9 Score - - 0 -     Cognitive Function MMSE - Mini Mental State Exam 10/09/2015  Orientation to time 5  Orientation to Place 5  Registration 3  Attention/ Calculation 5  Recall 3  Language- name 2 objects 2  Language- repeat 1  Language- follow 3 step command 3  Language- read & follow direction 1  Write a sentence 1  Copy design 1  Total score 30     6CIT Screen 10/07/2018 10/07/2017 10/03/2016  What Year? 0 points 0 points 0 points  What month? 0 points 0 points 0 points  What time? 0 points 0 points 0 points  Count back from 20 0 points 0 points 0 points  Months in reverse 0 points 0 points 0 points  Repeat phrase 0 points 0 points -  Total Score 0 0 -    Immunization History  Administered Date(s) Administered  . Influenza Split 07/05/2012, 07/27/2013, 06/29/2014  . Influenza, High Dose Seasonal PF 06/10/2017,  06/15/2018  . Influenza,inj,Quad PF,6+ Mos  06/04/2016  . Influenza-Unspecified 07/20/2015  . Pneumococcal Conjugate-13 04/06/2014  . Pneumococcal Polysaccharide-23 07/05/2012, 11/18/2017  . Tdap 10/16/2011  . Zoster 12/19/2011   Screening Tests Health Maintenance  Topic Date Due  . MAMMOGRAM  10/16/2018  . OPHTHALMOLOGY EXAM  10/22/2018  . HEMOGLOBIN A1C  12/09/2018  . FOOT EXAM  06/16/2019  . TETANUS/TDAP  10/15/2021  . INFLUENZA VACCINE  Completed  . DEXA SCAN  Completed  . PNA vac Low Risk Adult  Completed      Plan:   End of life planning; Advance aging; Advanced directives discussed. Copy of current HCPOA/Living Will requested.    I have personally reviewed and noted the following in the patient's chart:   . Medical and social history . Use of alcohol, tobacco or illicit drugs  . Current medications and supplements . Functional ability and status . Nutritional status . Physical activity . Advanced directives . List of other physicians . Hospitalizations, surgeries, and ER visits in previous 12 months . Vitals . Screenings to include cognitive, depression, and falls . Referrals and appointments  In addition, I have reviewed and discussed with patient certain preventive protocols, quality metrics, and best practice recommendations. A written personalized care plan for preventive services as well as general preventive health recommendations were provided to patient.     Varney Biles, LPN  61/[id number]

## 2018-10-08 ENCOUNTER — Ambulatory Visit: Payer: PPO

## 2018-10-20 ENCOUNTER — Other Ambulatory Visit: Payer: Self-pay | Admitting: Internal Medicine

## 2018-10-20 DIAGNOSIS — Z1231 Encounter for screening mammogram for malignant neoplasm of breast: Secondary | ICD-10-CM

## 2018-10-22 ENCOUNTER — Ambulatory Visit
Admission: RE | Admit: 2018-10-22 | Discharge: 2018-10-22 | Disposition: A | Discharge status: Home / Self Care | Payer: PPO | Source: Ambulatory Visit | Attending: Internal Medicine | Admitting: Internal Medicine

## 2018-10-22 DIAGNOSIS — Z1231 Encounter for screening mammogram for malignant neoplasm of breast: Secondary | ICD-10-CM | POA: Diagnosis not present

## 2018-10-28 ENCOUNTER — Ambulatory Visit (INDEPENDENT_AMBULATORY_CARE_PROVIDER_SITE_OTHER): Payer: PPO | Admitting: Family Medicine

## 2018-10-28 ENCOUNTER — Encounter: Payer: Self-pay | Admitting: Family Medicine

## 2018-10-28 ENCOUNTER — Ambulatory Visit (INDEPENDENT_AMBULATORY_CARE_PROVIDER_SITE_OTHER): Payer: PPO

## 2018-10-28 VITALS — BP 142/76 | HR 79 | Temp 98.2°F | Resp 18 | Ht 64.5 in | Wt 143.4 lb

## 2018-10-28 DIAGNOSIS — J309 Allergic rhinitis, unspecified: Secondary | ICD-10-CM

## 2018-10-28 DIAGNOSIS — R05 Cough: Secondary | ICD-10-CM | POA: Diagnosis not present

## 2018-10-28 DIAGNOSIS — R918 Other nonspecific abnormal finding of lung field: Secondary | ICD-10-CM | POA: Diagnosis not present

## 2018-10-28 DIAGNOSIS — R0982 Postnasal drip: Secondary | ICD-10-CM | POA: Diagnosis not present

## 2018-10-28 DIAGNOSIS — R059 Cough, unspecified: Secondary | ICD-10-CM

## 2018-10-28 DIAGNOSIS — J410 Simple chronic bronchitis: Secondary | ICD-10-CM | POA: Diagnosis not present

## 2018-10-28 MED ORDER — MONTELUKAST SODIUM 10 MG PO TABS: 10.0000 mg | ORAL_TABLET | Freq: Every day | ORAL | 3 refills | Status: DC

## 2018-10-28 NOTE — Patient Instructions (Signed)
Take claritin and flonase in the AM  Take singulair in the PM

## 2018-10-28 NOTE — Progress Notes (Signed)
Subjective:    Patient ID: Jane Davidson, female    DOB: 11-07-1940, 78 y.o.   MRN: [id number]  HPI   Patient presents to clinic complaining of cough, runny nose with clear drainage, nasal congestion and chest congestion for the past month.  Patient does take her Flonase nasal spray and Claritin 10 mg once per day.  She also takes her Advair 2 times per day for her COPD.  Denies shortness of breath or wheezing.  Denies phlegm production with cough.  Denies fever or chills.  Patient is a smoker.   Patient Active Problem List   Diagnosis Date Noted  . Chest pain at rest 08/15/2018  . Bug bite 08/15/2018  . Hyponatremia 06/11/2018  . Osteoarthritis of spine 03/01/2018  . Chronic low back pain without sciatica 03/01/2018  . Spinal stenosis of lumbosacral region 03/01/2018  . Lumbar herniated disc 03/01/2018  . Cardiac murmur 03/01/2018  . Nausea 03/01/2018  . Rhinorrhea 09/25/2017  . Dysuria 06/10/2017  . Atopic dermatitis 04/22/2017  . Carotid stenosis 08/29/2016  . Personal history of tobacco use, presenting hazards to health 06/18/2016  . Essential hypertension 02/29/2016  . Diabetes mellitus without complication ([place]) 76/28/3151  . Right hip pain 04/01/2015  . Onychomycosis of left great toe 04/01/2015  . Constipation 06/08/2014  . Anemia 09/27/2013  . Encounter for preventive health examination 03/27/2013  . Screening for colon cancer 10/16/2011  . Tobacco abuse counseling 08/09/2011  . History of peripheral vascular disease   . Bursitis, ischial   . Osteopenia   . Tobacco abuse disorder   . Vitamin D deficiency   . Arrhythmia 07/05/2011  . Hypothyroidism 07/05/2011  . 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 COPD (chronic obstructive pulmonary disease) ([place])   . Hyperlipidemia   . Screening for breast cancer 02/11/2011   Social History   Tobacco Use  . Smoking status: Current Every Day Smoker    Packs/day: 1.04    Years: 40.00      Pack years: 41.60    Types: Cigarettes  . Smokeless tobacco: Never Used  Substance Use Topics  . Alcohol use: No   Review of Systems  Constitutional: Negative for chills, fatigue and fever.  HENT: +nasal congestion, clear nasal drainage Eyes: Negative.   Respiratory: +cough. Negative for shortness of breath and wheezing.   Cardiovascular: Negative for chest pain, palpitations and leg swelling.  Gastrointestinal: Negative for abdominal pain, diarrhea, nausea and vomiting.  Genitourinary: Negative for dysuria, frequency and urgency.  Musculoskeletal: Negative for arthralgias and myalgias.  Skin: Negative for color change, pallor and rash.  Neurological: Negative for syncope, light-headedness and headaches.  Psychiatric/Behavioral: The patient is not nervous/anxious.       Objective:   Physical Exam  Constitutional: She appears well-developed and well-nourished. No distress.  HENT:  Head: Normocephalic and atraumatic.  Eyes: EOM are normal. No scleral icterus.  Ears: Normal Nose/throat: Positive clear nasal drainage and postnasal drip. Neck: Normal range of motion. Neck supple. No tracheal deviation present.  Cardiovascular: Normal rate, regular rhythm and normal heart sounds.  Pulmonary/Chest: Effort normal and breath sounds normal. No respiratory distress. She has no wheezes. She has no rales.  Neurological: She is alert and oriented to person, place, and time. Gait normal  Skin: Skin is warm and dry. No pallor.  Psychiatric: She has a normal mood and affect. Her behavior is normal. Thought content normal.   Nursing note and vitals reviewed.   Vitals:  10/28/18 1025  BP: (!) 142/76  Pulse: 79  Resp: 18  Temp: 98.2 F (36.8 C)  SpO2: 98%      Assessment & Plan:   Cough, postnasal drip, allergic rhinitis, COPD - due to patient's history of COPD we will get chest x-ray.  Advised patient to continue taking her Claritin and Flonase every morning, and will also add  Singulair 10 mg once per day before bed.  Patient will continue taking her Advair as prescribed.  If the addition of Singulair does not help with her congestion symptoms, we can consider referral to ENT.  Patient will keep regularly scheduled follow-up with PCP as planned.  Advised to return to clinic sooner if any issues arise.

## 2018-11-08 DIAGNOSIS — F333 Major depressive disorder, recurrent, severe with psychotic symptoms: Secondary | ICD-10-CM | POA: Diagnosis not present

## 2018-11-17 DIAGNOSIS — H52223 Regular astigmatism, bilateral: Secondary | ICD-10-CM | POA: Diagnosis not present

## 2018-11-17 DIAGNOSIS — Z7984 Long term (current) use of oral hypoglycemic drugs: Secondary | ICD-10-CM | POA: Diagnosis not present

## 2018-11-17 DIAGNOSIS — E119 Type 2 diabetes mellitus without complications: Secondary | ICD-10-CM | POA: Diagnosis not present

## 2018-11-17 DIAGNOSIS — H5203 Hypermetropia, bilateral: Secondary | ICD-10-CM | POA: Diagnosis not present

## 2018-11-17 DIAGNOSIS — Z961 Presence of intraocular lens: Secondary | ICD-10-CM | POA: Diagnosis not present

## 2018-11-17 DIAGNOSIS — H524 Presbyopia: Secondary | ICD-10-CM | POA: Diagnosis not present

## 2018-11-17 LAB — HM DIABETES EYE EXAM

## 2018-12-04 ENCOUNTER — Other Ambulatory Visit: Payer: Self-pay | Admitting: Internal Medicine

## 2018-12-16 ENCOUNTER — Telehealth: Payer: Self-pay | Admitting: *Deleted

## 2018-12-16 NOTE — Telephone Encounter (Signed)
Last A1c 8/19 was 5.8 , please advise any more labs,

## 2018-12-16 NOTE — Telephone Encounter (Signed)
Copied from [place] [id number]. Topic: Appointment Scheduling - Scheduling Inquiry for Clinic >> Dec 16, 2018 12:17 PM Vernona Rieger wrote: Reason for CRM: Patient said she seen on my chart she needed to have her A1C checked. Please advise, no orders in. Thanks

## 2018-12-16 NOTE — Telephone Encounter (Signed)
Copied from [place] [id number]. Topic: Appointment Scheduling - Scheduling Inquiry for Clinic >> Dec 16, 2018 12:17 PM Jane Davidson wrote: Reason for CRM: Patient said she seen on my chart she needed to have her A1C checked. Please advise, no orders in. Thanks

## 2018-12-17 ENCOUNTER — Other Ambulatory Visit: Payer: Self-pay | Admitting: Internal Medicine

## 2018-12-27 ENCOUNTER — Other Ambulatory Visit: Payer: Self-pay | Admitting: Internal Medicine

## 2019-03-09 ENCOUNTER — Other Ambulatory Visit: Payer: Self-pay

## 2019-03-09 MED ORDER — LOSARTAN POTASSIUM 100 MG PO TABS: ORAL_TABLET | ORAL | 1 refills | Status: DC

## 2019-03-11 ENCOUNTER — Other Ambulatory Visit: Payer: Self-pay

## 2019-03-11 MED ORDER — SIMVASTATIN 20 MG PO TABS: 20.0000 mg | ORAL_TABLET | Freq: Every day | ORAL | 1 refills | Status: DC

## 2019-04-11 ENCOUNTER — Other Ambulatory Visit: Payer: Self-pay

## 2019-04-11 MED ORDER — FLUTICASONE PROPIONATE 50 MCG/ACT NA SUSP: NASAL | 2 refills | Status: DC

## 2019-04-13 ENCOUNTER — Ambulatory Visit: Payer: Self-pay

## 2019-04-13 DIAGNOSIS — Z20822 Contact with and (suspected) exposure to covid-19: Secondary | ICD-10-CM

## 2019-04-13 NOTE — Telephone Encounter (Signed)
Patient has been around a friend who's granddaughter has tested positive for COVID now patient has symptoms of headache, fatigue, nausea  No diarrhea. Patient would like to know should she be tested, patient was not around the person that tested positive, but her friend has been contact and now how been around her.

## 2019-04-13 NOTE — Telephone Encounter (Signed)
If the grandmother of the infected patient has not  Been symptomatic and has not been tested AND she was distancing and masking, it is unlikely that she has it,  But if she wants to be tested she will need to wait 5 days from the contact

## 2019-04-13 NOTE — Telephone Encounter (Signed)
Incoming call from Pt.   Stating that she was around her friend whose [place] daughter has tested positive for Covid-19. wondering what she should do .  Reviewed protocol and Provided care advice.  Pt.  Voices understanding.          Reason for Disposition . COVID-19 Testing, questions about  Answer Assessment - Initial Assessment Questions 1. COVID-19 DIAGNOSIS: "Who made your Coronavirus (COVID-19) diagnosis?" "Was it confirmed by a positive lab test?" If not diagnosed by a HCP, ask "Are there lots of cases (community spread) where you live?" (See public health department website, if unsure)     denies 2. ONSET: "When did the COVID-19 symptoms start?"      2 days 3. WORST SYMPTOM: "What is your worst symptom?" (e.g., cough, fever, shortness of breath, muscle aches)     stomach 4. COUGH: "Do you have a cough?" If so, ask: "How bad is the cough?"       denies 5. FEVER: "Do you have a fever?" If so, ask: "What is your temperature, how was it measured, and when did it start?"    denies 6. RESPIRATORY STATUS: "Describe your breathing?" (e.g., shortness of breath, wheezing, unable to speak)      denies 7. BETTER-SAME-WORSE: "Are you getting better, staying the same or getting worse compared to yesterday?"  If getting worse, ask, "In what way?"    same 8. HIGH RISK DISEASE: "Do you have any chronic medical problems?" (e.g., asthma, heart or lung disease, weak immune system, etc.)     diabetic 9. PREGNANCY: "Is there any chance you are pregnant?" "When was your last menstrual period?"     na 10. OTHER SYMPTOMS: "Do you have any other symptoms?"  (e.g., chills, fatigue, headache, loss of smell or taste, muscle pain, sore throat)      Headache, tired  Protocols used: CORONAVIRUS (COVID-19) DIAGNOSED OR SUSPECTED-A-AH

## 2019-04-14 ENCOUNTER — Other Ambulatory Visit: Payer: PPO

## 2019-04-14 DIAGNOSIS — R6889 Other general symptoms and signs: Secondary | ICD-10-CM | POA: Diagnosis not present

## 2019-04-14 DIAGNOSIS — Z20822 Contact with and (suspected) exposure to covid-19: Secondary | ICD-10-CM

## 2019-04-14 NOTE — Telephone Encounter (Signed)
You can order the covid 19 test

## 2019-04-14 NOTE — Addendum Note (Signed)
Addended by: Denyce Robert on: 04/14/2019 02:19 PM   Modules accepted: Orders

## 2019-04-14 NOTE — Telephone Encounter (Signed)
Spoke with patient, scheduled her for COVID 19 testing today at [facility] at 3 pm.  Testing protocol reviewed with patient.

## 2019-04-14 NOTE — Telephone Encounter (Signed)
Patient last contact was 04/09/19 with suspected COVID and she has now has sinus drainage, started last night with headache. Patient would like to be tested.

## 2019-04-20 ENCOUNTER — Telehealth: Payer: Self-pay | Admitting: Internal Medicine

## 2019-04-20 LAB — NOVEL CORONAVIRUS, NAA: SARS-CoV-2, NAA: NOT DETECTED

## 2019-04-20 NOTE — Telephone Encounter (Signed)
Pt. Given COVID 19 results, verbalizes understanding. 

## 2019-05-20 ENCOUNTER — Telehealth: Payer: Self-pay

## 2019-05-20 MED ORDER — LEVOTHYROXINE SODIUM 100 MCG PO TABS: 100.0000 ug | ORAL_TABLET | Freq: Every day | ORAL | 0 refills | Status: DC

## 2019-05-20 NOTE — Telephone Encounter (Signed)
Refilled medication

## 2019-05-23 ENCOUNTER — Ambulatory Visit: Payer: Self-pay | Admitting: Internal Medicine

## 2019-05-23 DIAGNOSIS — I10 Essential (primary) hypertension: Secondary | ICD-10-CM | POA: Diagnosis not present

## 2019-05-23 DIAGNOSIS — F419 Anxiety disorder, unspecified: Secondary | ICD-10-CM | POA: Diagnosis not present

## 2019-05-23 NOTE — Telephone Encounter (Signed)
Called and spoke with patient and advised she needed to be evaluated immediately she refused ER and refused EMS, stated she would drive her self to [place] walk in. Patient had increased BP reading of 203/101 this morning, Patient stated she had heaviness in bilateral legs , tingling in finger tips, with nausea , advised patient unsafe for her to drive and she stated she was going to drive because she has no one to drive her patient going to [facility] walk-in. Late documentation from 8:20 am  FYI

## 2019-05-23 NOTE — Telephone Encounter (Signed)
  Pt called in c/o elevated BP for the last 2-3 days.  Yesterday her BP was 196/82 P-67;  Last night is was 174/72; and this morning it's 203/101.  Having headaches on and off and feeling very tired.  I let her know the office opens at 8:00 and I would send a high priority note asking them to call her for an appt after they open.   I went over the s/s to watch for to call 911 if they occur from the protocol.   She verbalized understanding.  She was agreeable to having someone call her back for an appt.  I sent these notes to Dr. Lupita Dawn office high priority.   Reason for Disposition . Systolic BP  >= 161 OR Diastolic >= 096  Answer Assessment - Initial Assessment Questions 1. BLOOD PRESSURE: "What is the blood pressure?" "Did you take at least two measurements 5 minutes apart?"     203/101 this morning after taking BP pill    Normally runs 140/70's 2. ONSET: "When did you take your blood pressure?"     6:0 AM 3. HOW: "How did you obtain the blood pressure?" (e.g., visiting nurse, automatic home BP monitor)     Automatic BP machine 4. HISTORY: "Do you have a history of high blood pressure?"     Yes 5. MEDICATIONS: "Are you taking any medications for blood pressure?" "Have you missed any doses recently?"     Yes   Losartan , metoprolol 6. OTHER SYMPTOMS: "Do you have any symptoms?" (e.g., headache, chest pain, blurred vision, difficulty breathing, weakness)     Headaches and tired and drained for 3-4 days 7. PREGNANCY: "Is there any chance you are pregnant?" "When was your last menstrual period?"     N/A due to age  Protocols used: [place]

## 2019-05-23 NOTE — Telephone Encounter (Addendum)
Called patient to triage.  Pt c/o of being tired, fatigue, nauseous with some lightheadedness, tingling in right hand, cramps and a feeling of heaviness in both leg calves.  Pt denied having chest pain or chest tightness.  Pt denied having numbness or face droopiness.  Pt's bp has be elevated for the past few days.  Pt said that this morning bp was 203/101 and later this morning bp was 187/86.  Pt said that she just didn't feel well.    Pt said that she took her last dose of losartan at 6:00 am this morning and her last dose of metoprolol at 4:00 pm on yesterday.  Advised pt that given symptoms she should go to ED or UC to be evaluated in person today.  Pt said that she did not want to go to UC.   I placed pt on hold to get [place] RN to speak with pt.  Clinic Team Lead RN spoke with pt.  Pt will go to [facility].

## 2019-05-25 ENCOUNTER — Telehealth: Payer: Self-pay

## 2019-05-25 NOTE — Telephone Encounter (Signed)
Copied from [place] [id number]. Topic: General - Other >> May 25, 2019  2:27 PM Burchel, Abbi R wrote: Reason for CRM: Pt requesting call back from Kerin Salen re: NT note from 05/23/2019.  Please call pt: [phone] or [phone]

## 2019-05-25 NOTE — Telephone Encounter (Signed)
Scheduled patient in office visit for Follow up from [facility] clinic on Monday for elevated BP 208/101 with numbness and tingling in fingers. Patient passed COVID screening exception of occasional cough that is chronic from COPD. Provider ok with in office visit patient is concerned about lab results from [facility] in care everywhere.

## 2019-05-25 NOTE — Telephone Encounter (Signed)
Patient scheduled in office on Thursday.

## 2019-05-26 ENCOUNTER — Ambulatory Visit (INDEPENDENT_AMBULATORY_CARE_PROVIDER_SITE_OTHER): Payer: PPO

## 2019-05-26 ENCOUNTER — Ambulatory Visit (INDEPENDENT_AMBULATORY_CARE_PROVIDER_SITE_OTHER): Payer: PPO | Admitting: Internal Medicine

## 2019-05-26 ENCOUNTER — Encounter: Payer: Self-pay | Admitting: Internal Medicine

## 2019-05-26 ENCOUNTER — Other Ambulatory Visit: Payer: Self-pay

## 2019-05-26 VITALS — BP 160/70 | HR 66 | Temp 97.7°F | Resp 15 | Ht 64.5 in | Wt 148.2 lb

## 2019-05-26 DIAGNOSIS — G8929 Other chronic pain: Secondary | ICD-10-CM

## 2019-05-26 DIAGNOSIS — E034 Atrophy of thyroid (acquired): Secondary | ICD-10-CM | POA: Diagnosis not present

## 2019-05-26 DIAGNOSIS — R937 Abnormal findings on diagnostic imaging of other parts of musculoskeletal system: Secondary | ICD-10-CM

## 2019-05-26 DIAGNOSIS — E1151 Type 2 diabetes mellitus with diabetic peripheral angiopathy without gangrene: Secondary | ICD-10-CM | POA: Diagnosis not present

## 2019-05-26 DIAGNOSIS — E782 Mixed hyperlipidemia: Secondary | ICD-10-CM | POA: Diagnosis not present

## 2019-05-26 DIAGNOSIS — E119 Type 2 diabetes mellitus without complications: Secondary | ICD-10-CM

## 2019-05-26 DIAGNOSIS — M25552 Pain in left hip: Secondary | ICD-10-CM | POA: Diagnosis not present

## 2019-05-26 DIAGNOSIS — R200 Anesthesia of skin: Secondary | ICD-10-CM

## 2019-05-26 DIAGNOSIS — M542 Cervicalgia: Secondary | ICD-10-CM | POA: Diagnosis not present

## 2019-05-26 DIAGNOSIS — E871 Hypo-osmolality and hyponatremia: Secondary | ICD-10-CM | POA: Diagnosis not present

## 2019-05-26 DIAGNOSIS — I10 Essential (primary) hypertension: Secondary | ICD-10-CM | POA: Diagnosis not present

## 2019-05-26 DIAGNOSIS — J410 Simple chronic bronchitis: Secondary | ICD-10-CM | POA: Diagnosis not present

## 2019-05-26 DIAGNOSIS — R202 Paresthesia of skin: Secondary | ICD-10-CM

## 2019-05-26 DIAGNOSIS — M1612 Unilateral primary osteoarthritis, left hip: Secondary | ICD-10-CM | POA: Diagnosis not present

## 2019-05-26 DIAGNOSIS — Z8679 Personal history of other diseases of the circulatory system: Secondary | ICD-10-CM

## 2019-05-26 LAB — BASIC METABOLIC PANEL
BUN: 13 mg/dL (ref 6–23)
CO2: 25 mEq/L (ref 19–32)
Calcium: 8.8 mg/dL (ref 8.4–10.5)
Chloride: 98 mEq/L (ref 96–112)
Creatinine, Ser: 0.77 mg/dL (ref 0.40–1.20)
GFR: 72.76 mL/min (ref >60.00)
Glucose, Bld: 99 mg/dL (ref 70–99)
Potassium: 4 mEq/L (ref 3.5–5.1)
Sodium: 131 mEq/L — ABNORMAL LOW (ref 135–145)

## 2019-05-26 LAB — LIPID PANEL
Cholesterol: 128 mg/dL (ref 0–200)
HDL: 52.5 mg/dL (ref >39.00)
LDL Cholesterol: 61 mg/dL (ref 0–99)
NonHDL: 75.04
Total CHOL/HDL Ratio: 2
Triglycerides: 68 mg/dL (ref 0.0–149.0)
VLDL: 13.6 mg/dL (ref 0.0–40.0)

## 2019-05-26 LAB — POCT GLYCOSYLATED HEMOGLOBIN (HGB A1C): Hemoglobin A1C: 5.4 % (ref 4.0–5.6)

## 2019-05-26 LAB — TSH: TSH: 1.47 u[IU]/mL (ref 0.35–4.50)

## 2019-05-26 MED ORDER — AMLODIPINE BESYLATE 2.5 MG PO TABS: 2.5000 mg | ORAL_TABLET | Freq: Every day | ORAL | 3 refills | Status: DC

## 2019-05-26 MED ORDER — TRAMADOL HCL 50 MG PO TABS: 50.0000 mg | ORAL_TABLET | Freq: Four times a day (QID) | ORAL | 0 refills | Status: DC | PRN

## 2019-05-26 MED ORDER — TRIAMCINOLONE ACETONIDE 0.1 % EX CREA: 1.0000 "application " | TOPICAL_CREAM | Freq: Two times a day (BID) | CUTANEOUS | 0 refills | Status: DC

## 2019-05-26 NOTE — Progress Notes (Signed)
Subjective:  Patient ID: Jane Davidson, female    DOB: 1941-01-18  Age: 78 y.o. MRN: [id number]  CC: The primary encounter diagnosis was Numbness and tingling in right hand. Diagnoses of Left hip pain, Hyponatremia, Diabetes mellitus without complication ([place]), Hypothyroidism due to acquired atrophy of thyroid, History of peripheral vascular disease, Mixed hyperlipidemia, Essential hypertension, Abnormal x-ray of cervical spine, and Chronic left hip pain were also pertinent to this visit.  HPI Jane Davidson presents for evaluation and treatment of uncontrolled hypertension  Referred to ER.  Went to urgent care on August 10 for severe htn and tingling of right arm and headache without blurred vision.  Losartan dose increased from 50 mg to 75 mg per OV noted , but she states that she has been taking 100 mg daily since last visit . hctz unchannged at 12.5 mg .  meloxicam daily dosing was not stopped   Given lorazepam 0.5 mg q 8 hours prn for concurrent increased anxiety #21 tablets Home readings since Urgent Care visit are ranging from 992 systolic to 426   Labs reviewed sodium 135  Glu 188  Bun/cr 11/0.8   Outpatient Medications Prior to Visit  Medication Sig Dispense Refill  . ADVAIR DISKUS 100-50 MCG/DOSE AEPB INHALE 1 PUFF BY MOUTH TWICE DAILY 1 each 3  . augmented betamethasone dipropionate (DIPROLENE-AF) 0.05 % cream   1  . Cholecalciferol (VITAMIN D3) 1000 UNITS CAPS Take 1 capsule by mouth daily.    . clobetasol ointment (TEMOVATE) 8.34 % Apply 1 application topically 2 (two) times daily. 30 g 5  . fluticasone (FLONASE) 50 MCG/ACT nasal spray SHAKE LIQUID WELL AND USE 2 SPRAYS IN EACH NOSTRIL DAILY. 16 g 2  . levothyroxine (SYNTHROID) 100 MCG tablet Take 1 tablet (100 mcg total) by mouth daily. 90 tablet 0  . loratadine (CLARITIN) 10 MG tablet Take 1 tablet (10 mg total) by mouth daily. 30 tablet 11  . losartan (COZAAR) 100 MG tablet TAKE 1 TABLET(100 MG) BY MOUTH DAILY 90  tablet 1  . metoprolol succinate (TOPROL-XL) 25 MG 24 hr tablet Take 1 tablet (25 mg total) by mouth daily. 90 tablet 3  . montelukast (SINGULAIR) 10 MG tablet Take 1 tablet (10 mg total) by mouth at bedtime. 30 tablet 3  . nitroGLYCERIN (NITROSTAT) 0.4 MG SL tablet Place 1 tablet (0.4 mg total) under the tongue every 5 (five) minutes as needed for chest pain. 50 tablet 3  . phenazopyridine (PYRIDIUM) 100 MG tablet Take 1 tablet (100 mg total) by mouth 3 (three) times daily as needed for pain. 10 tablet 0  . sertraline (ZOLOFT) 50 MG tablet Take 1 tablet (50 mg total) by mouth daily. 90 tablet 1  . simvastatin (ZOCOR) 20 MG tablet Take 1 tablet (20 mg total) by mouth daily. 90 tablet 1  . traMADol (ULTRAM) 50 MG tablet Take 1 tablet (50 mg total) by mouth every 6 (six) hours as needed. 120 tablet 0   No facility-administered medications prior to visit.     Review of Systems;  Patient denies headache, fevers, malaise, unintentional weight loss, skin rash, eye pain, sinus congestion and sinus pain, sore throat, dysphagia,  hemoptysis , cough, dyspnea, wheezing, chest pain, palpitations, orthopnea, edema, abdominal pain, nausea, melena, diarrhea, constipation, flank pain, dysuria, hematuria, urinary  Frequency, nocturia, numbness, tingling, seizures,  Focal weakness, Loss of consciousness,  Tremor, insomnia, depression, anxiety, and suicidal ideation.      Objective:  BP (!) 160/70 (BP Location: Left  Arm, Patient Position: Sitting, Cuff Size: Normal)   Pulse 66   Temp 97.7 F (36.5 C) (Oral)   Resp 15   Ht 5' 4.5" (1.638 m)   Wt 148 lb 3.2 oz (67.2 kg)   SpO2 98%   BMI 25.05 kg/m   BP Readings from Last 3 Encounters:  05/26/19 (!) 160/70  10/28/18 (!) 142/76  10/07/18 (!) 142/78    Wt Readings from Last 3 Encounters:  05/26/19 148 lb 3.2 oz (67.2 kg)  10/28/18 143 lb 6.4 oz (65 kg)  10/07/18 145 lb (65.8 kg)    General appearance: alert, cooperative and appears stated age  Ears: normal TM's and external ear canals both ears Throat: lips, mucosa, and tongue normal; teeth and gums normal Neck: no adenopathy, no carotid bruit, supple, symmetrical, trachea midline and thyroid not enlarged, symmetric, no tenderness/mass/nodules Back: symmetric, no curvature. ROM normal. No CVA tenderness. Lungs: clear to auscultation bilaterally Heart: regular rate and rhythm, S1, S2 normal, no murmur, click, rub or gallop Abdomen: soft, non-tender; bowel sounds normal; no masses,  no organomegaly Pulses: 2+ and symmetric Skin: Skin color, texture, turgor normal. No rashes or lesions Lymph nodes: Cervical, supraclavicular, and axillary nodes normal.  Lab Results  Component Value Date   HGBA1C 5.4 05/26/2019   HGBA1C 5.8 06/08/2018   HGBA1C 5.9 11/18/2017    Lab Results  Component Value Date   CREATININE 0.77 05/26/2019   CREATININE 0.75 06/28/2018   CREATININE 0.86 06/15/2018    Lab Results  Component Value Date   WBC 10.2 06/08/2018   HGB 12.6 06/08/2018   HCT 36.6 06/08/2018   PLT 304.0 06/08/2018   GLUCOSE 99 05/26/2019   CHOL 128 05/26/2019   TRIG 68.0 05/26/2019   HDL 52.50 05/26/2019   LDLDIRECT 69.0 05/07/2017   LDLCALC 61 05/26/2019   ALT 11 06/08/2018   AST 17 06/08/2018   NA 131 (L) 05/26/2019   K 4.0 05/26/2019   CL 98 05/26/2019   CREATININE 0.77 05/26/2019   BUN 13 05/26/2019   CO2 25 05/26/2019   TSH 1.47 05/26/2019   INR 1.0 11/03/2014   HGBA1C 5.4 05/26/2019   MICROALBUR <0.7 11/18/2017    Mm 3d Screen Breast Bilateral  Result Date: 10/22/2018 CLINICAL DATA:  Screening. EXAM: DIGITAL SCREENING BILATERAL MAMMOGRAM WITH TOMO AND CAD COMPARISON:  Previous exam(s). ACR Breast Density Category b: There are scattered areas of fibroglandular density. FINDINGS: There are no findings suspicious for malignancy. Images were processed with CAD. IMPRESSION: No mammographic evidence of malignancy. A result letter of this screening mammogram will be  mailed directly to the patient. RECOMMENDATION: Screening mammogram in one year. (Code:SM-B-01Y) BI-RADS CATEGORY  1: Negative. Electronically Signed   By: Lillia Mountain M.D.   On: 10/22/2018 13:52    Assessment & Plan:   Problem List Items Addressed This Visit      Unprioritized   History of peripheral vascular disease   Hypothyroidism   Relevant Orders   TSH (Completed)   Hyperlipidemia   Relevant Medications   amLODipine (NORVASC) 2.5 MG tablet   Other Relevant Orders   Lipid panel (Completed)   Diabetes mellitus without complication ([place])   Relevant Orders   POCT HgB A1C (Completed)   Chronic left hip pain    Recommend referral to Orthopedics for evaluation of hip jointg given abnormal hip films .  Refilling tramadol,; avoiding meloxicam due to uncontrolled hypertension       Relevant Medications   traMADol (ULTRAM) 50 MG  tablet   Essential hypertension    Uncontrolled.  meloxicam stopped. 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  . Will d/c hctz if sodium drops below 130      Relevant Orders   Basic metabolic panel (Completed)   Abnormal x-ray of cervical spine    plain films of the neck ere done to evaluate her report of  right arm numbness in the setting od chronic neck pain. .  Films noted severe degenerative e changes at multiple levels have caused bone spurs that may be resulting in foraminal stenosis and nerve root impingement  An MRI would provide more detailed information .  Patient was advised         Other Visit  Diagnoses    Numbness and tingling in right hand    -  Primary   Relevant Orders   DG Cervical Spine Complete (Completed)   Left hip pain       Relevant Orders   DG HIP UNILAT WITH PELVIS 2-3 VIEWS LEFT (Completed)      I am having Jane Davidson start on amLODipine and triamcinolone cream. I am also having her maintain her Vitamin D3, clobetasol ointment, sertraline, loratadine, Advair Diskus, augmented betamethasone dipropionate, metoprolol succinate, nitroGLYCERIN, phenazopyridine, montelukast, losartan, simvastatin, fluticasone, levothyroxine, and traMADol.  Meds ordered this encounter  Medications  . amLODipine (NORVASC) 2.5 MG tablet    Sig: Take 1 tablet (2.5 mg total) by mouth daily.    Dispense:  90 tablet    Refill:  3  . triamcinolone cream (KENALOG) 0.1 %    Sig: Apply 1 application topically 2 (two) times daily.    Dispense:  30 g    Refill:  0  . traMADol (ULTRAM) 50 MG tablet    Sig: Take 1 tablet (50 mg total) by mouth every 6 (six) hours as needed.    Dispense:  30 tablet    Refill:  0    Medications Discontinued During This Encounter  Medication Reason  . traMADol (ULTRAM) 50 MG tablet Reorder    Follow-up: No follow-ups on file.   Crecencio Mc, MD

## 2019-05-26 NOTE — Patient Instructions (Addendum)
No more meloxicam or aleve or motrin    You can add up to 2000 mg of acetominophen (tylenol) every day safely  In divided doses (500 mg every 6 hours  Or 1000 mg every 12 hours.)   Tonight take 100 mg losartan and 25 mg metoprolol  And continue both of them nightly  Take thyroid pill by itself in the morning  If you r blood pressure is is  150/90  Or higher  At lunchtime,  Add 2.5 mg amlodipine  at lunch .  If this is necessary every day  We will need to send you for an ultrasound of your kidneys

## 2019-05-27 ENCOUNTER — Ambulatory Visit: Payer: PPO | Admitting: Internal Medicine

## 2019-05-28 ENCOUNTER — Encounter: Payer: Self-pay | Admitting: Internal Medicine

## 2019-05-28 DIAGNOSIS — M5030 Other cervical disc degeneration, unspecified cervical region: Secondary | ICD-10-CM | POA: Insufficient documentation

## 2019-05-28 DIAGNOSIS — E1151 Type 2 diabetes mellitus with diabetic peripheral angiopathy without gangrene: Secondary | ICD-10-CM | POA: Insufficient documentation

## 2019-05-28 DIAGNOSIS — R937 Abnormal findings on diagnostic imaging of other parts of musculoskeletal system: Secondary | ICD-10-CM | POA: Insufficient documentation

## 2019-05-28 NOTE — Assessment & Plan Note (Signed)
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  . Will d/c hctz if sodium drops below 130

## 2019-05-28 NOTE — Assessment & Plan Note (Addendum)
Recommend referral to Orthopedics for evaluation of hip jointg given abnormal hip films .  Refilling tramadol,; avoiding meloxicam due to uncontrolled hypertension

## 2019-05-28 NOTE — Assessment & Plan Note (Signed)
plain films of the neck noted severe degenerative e changes at multiple levels have caused bone spurs that may be resulting in foraminal stenosis and nerve root impingement  An MRI would provide more detailed information .  Patient was advised

## 2019-05-28 NOTE — Assessment & Plan Note (Signed)
plain films of the neck ere done to evaluate her report of  right arm numbness in the setting od chronic neck pain. .  Films noted severe degenerative e changes at multiple levels have caused bone spurs that may be resulting in foraminal stenosis and nerve root impingement  An MRI would provide more detailed information .  Patient was advised

## 2019-05-28 NOTE — Assessment & Plan Note (Addendum)
Uncontrolled.  meloxicam stopped. Advised to take losartan at night,  Continue hctz in the am and add amlodipine 2.5 mg daily if repeat readings are equal to or greater than 161 systolic   Lab Results  Component Value Date   CREATININE 0.77 05/26/2019   Lab Results  Component Value Date   NA 131 (L) 05/26/2019   K 4.0 05/26/2019   CL 98 05/26/2019   CO2 25 05/26/2019

## 2019-05-28 NOTE — Assessment & Plan Note (Addendum)
Abnormal left hip films obtained,  Will recommend orthopedic evaluation

## 2019-06-06 DIAGNOSIS — F333 Major depressive disorder, recurrent, severe with psychotic symptoms: Secondary | ICD-10-CM | POA: Diagnosis not present

## 2019-06-11 ENCOUNTER — Other Ambulatory Visit: Payer: Self-pay | Admitting: Internal Medicine

## 2019-06-13 ENCOUNTER — Ambulatory Visit: Payer: PPO

## 2019-06-17 ENCOUNTER — Ambulatory Visit (INDEPENDENT_AMBULATORY_CARE_PROVIDER_SITE_OTHER): Payer: PPO

## 2019-06-17 ENCOUNTER — Other Ambulatory Visit: Payer: Self-pay

## 2019-06-17 DIAGNOSIS — Z23 Encounter for immunization: Secondary | ICD-10-CM | POA: Diagnosis not present

## 2019-06-27 DIAGNOSIS — C44722 Squamous cell carcinoma of skin of right lower limb, including hip: Secondary | ICD-10-CM | POA: Diagnosis not present

## 2019-06-27 DIAGNOSIS — D485 Neoplasm of uncertain behavior of skin: Secondary | ICD-10-CM | POA: Diagnosis not present

## 2019-06-27 DIAGNOSIS — L281 Prurigo nodularis: Secondary | ICD-10-CM | POA: Diagnosis not present

## 2019-07-19 ENCOUNTER — Other Ambulatory Visit: Payer: Self-pay | Admitting: Internal Medicine

## 2019-07-19 MED ORDER — FLUTICASONE PROPIONATE 50 MCG/ACT NA SUSP: NASAL | 2 refills | Status: DC

## 2019-07-20 DIAGNOSIS — C44722 Squamous cell carcinoma of skin of right lower limb, including hip: Secondary | ICD-10-CM | POA: Diagnosis not present

## 2019-08-19 ENCOUNTER — Telehealth: Payer: Self-pay | Admitting: *Deleted

## 2019-08-19 NOTE — Telephone Encounter (Signed)
Contacted in attempt to schedule lung screening scan. Patient is aware of risk of postponing screening but would like to wait until January to consider having scan.

## 2019-08-24 ENCOUNTER — Other Ambulatory Visit: Payer: Self-pay | Admitting: *Deleted

## 2019-08-24 MED ORDER — LEVOTHYROXINE SODIUM 100 MCG PO TABS: 100.0000 ug | ORAL_TABLET | Freq: Every day | ORAL | 2 refills | Status: DC

## 2019-08-30 ENCOUNTER — Ambulatory Visit: Payer: Self-pay | Admitting: *Deleted

## 2019-08-30 NOTE — Telephone Encounter (Signed)
FYI

## 2019-08-30 NOTE — Telephone Encounter (Signed)
Pt called wanting to know if it is okay for to take generic Levothyroxine; explained this medication has the same formulation, potency, and safety as brand name; she verbalized understanding; she verbalized understanding; the pt sees Dr Derrel Nip, [facility]; will route to office for notification.     Reason for Disposition . Caller has medication question only, adult not sick, and triager answers question  Answer Assessment - Initial Assessment Questions 1.   NAME of MEDICATION: "What medicine are you calling about?"       Synthroid  2.   QUESTION: "What is your question?"        Is generic levothyroxine safe 3.   PRESCRIBING HCP: "Who prescribed it?" Reason: if prescribed by specialist, call should be referred to that group.     Dr Derrel Nip 4. SYMPTOMS: "Do you have any symptoms?"     no 5. SEVERITY: If symptoms are present, ask "Are they mild, moderate or severe?"    no 6.  PREGNANCY:  "Is there any chance that you are pregnant?" "When was your last menstrual period?"  no  Protocols used: MEDICATION QUESTION CALL-A-AH

## 2019-09-02 ENCOUNTER — Other Ambulatory Visit: Payer: Self-pay

## 2019-09-13 ENCOUNTER — Ambulatory Visit (INDEPENDENT_AMBULATORY_CARE_PROVIDER_SITE_OTHER): Payer: PPO

## 2019-09-13 ENCOUNTER — Other Ambulatory Visit: Payer: Self-pay

## 2019-09-13 ENCOUNTER — Encounter (INDEPENDENT_AMBULATORY_CARE_PROVIDER_SITE_OTHER): Payer: Self-pay | Admitting: Vascular Surgery

## 2019-09-13 ENCOUNTER — Ambulatory Visit (INDEPENDENT_AMBULATORY_CARE_PROVIDER_SITE_OTHER): Payer: PPO | Admitting: Vascular Surgery

## 2019-09-13 VITALS — BP 146/73 | HR 71 | Resp 16 | Wt 145.0 lb

## 2019-09-13 DIAGNOSIS — E7800 Pure hypercholesterolemia, unspecified: Secondary | ICD-10-CM | POA: Diagnosis not present

## 2019-09-13 DIAGNOSIS — I10 Essential (primary) hypertension: Secondary | ICD-10-CM | POA: Diagnosis not present

## 2019-09-13 DIAGNOSIS — I6523 Occlusion and stenosis of bilateral carotid arteries: Secondary | ICD-10-CM

## 2019-09-13 DIAGNOSIS — E1151 Type 2 diabetes mellitus with diabetic peripheral angiopathy without gangrene: Secondary | ICD-10-CM | POA: Diagnosis not present

## 2019-09-13 NOTE — Assessment & Plan Note (Signed)
Her duplex today shows a patent right carotid endarterectomy without significant recurrent stenosis.  Her left carotid artery stenosis is stable in the upper end of the 40 to 59% range without significant progression from her previous study. Continue current medical regimen and recheck in 1 year with carotid duplex.

## 2019-09-13 NOTE — Progress Notes (Signed)
MRN : [id number]  Jane Davidson is a 78 y.o. ([birth year]) female who presents with chief complaint of  Chief Complaint  Patient presents with  . Follow-up    ultrassound follow up  .  History of Present Illness: Patient returns in follow-up of her carotid disease.  She is doing well today without specific complaints.  She is status post right carotid endarterectomy in the past.  Her duplex today shows a patent right carotid endarterectomy without significant recurrent stenosis.  Her left carotid artery stenosis is stable in the upper end of the 40 to 59% range without significant progression from her previous study.  She denies any focal neurologic symptoms. Specifically, the patient denies amaurosis fugax, speech or swallowing difficulties, or arm or leg weakness or numbness   Current Outpatient Medications  Medication Sig Dispense Refill  . amLODipine (NORVASC) 2.5 MG tablet Take 1 tablet (2.5 mg total) by mouth daily. 90 tablet 3  . augmented betamethasone dipropionate (DIPROLENE-AF) 0.05 % cream   1  . Cholecalciferol (VITAMIN D3) 1000 UNITS CAPS Take 1 capsule by mouth daily.    . clobetasol ointment (TEMOVATE) AB-123456789 % Apply 1 application topically 2 (two) times daily. 30 g 5  . fluticasone (FLONASE) 50 MCG/ACT nasal spray SHAKE LIQUID WELL AND USE 2 SPRAYS IN EACH NOSTRIL DAILY. 16 g 2  . levothyroxine (SYNTHROID) 100 MCG tablet Take 1 tablet (100 mcg total) by mouth daily. 90 tablet 2  . loratadine (CLARITIN) 10 MG tablet Take 1 tablet (10 mg total) by mouth daily. 30 tablet 11  . losartan (COZAAR) 100 MG tablet TAKE 1 TABLET(100 MG) BY MOUTH DAILY 90 tablet 1  . metoprolol succinate (TOPROL-XL) 25 MG 24 hr tablet TAKE 1 TABLET(25 MG) BY MOUTH DAILY 90 tablet 3  . montelukast (SINGULAIR) 10 MG tablet Take 1 tablet (10 mg total) by mouth at bedtime. 30 tablet 3  . nitroGLYCERIN (NITROSTAT) 0.4 MG SL tablet Place 1 tablet (0.4 mg total) under the tongue every 5 (five) minutes as  needed for chest pain. 50 tablet 3  . phenazopyridine (PYRIDIUM) 100 MG tablet Take 1 tablet (100 mg total) by mouth 3 (three) times daily as needed for pain. 10 tablet 0  . sertraline (ZOLOFT) 50 MG tablet Take 1 tablet (50 mg total) by mouth daily. 90 tablet 1  . simvastatin (ZOCOR) 20 MG tablet Take 1 tablet (20 mg total) by mouth daily. 90 tablet 1  . traMADol (ULTRAM) 50 MG tablet Take 1 tablet (50 mg total) by mouth every 6 (six) hours as needed. 30 tablet 0  . triamcinolone cream (KENALOG) 0.1 % Apply 1 application topically 2 (two) times daily. 30 g 0  . WIXELA INHUB 100-50 MCG/DOSE AEPB INHALE 1 PUFF BY MOUTH TWICE DAILY 60 each 2   No current facility-administered medications for this visit.     Past Medical History:  Diagnosis Date  . Abnormal Pap smear of cervix 2001   Biopsy normal  . Bursitis NEC   . COPD (chronic obstructive pulmonary disease) ([place])   . History of peripheral vascular disease 2001   s/p bilateral aorto-iliac-femoral bypass /[facility]  . Hyperlipidemia   . hypothyroid   . Osteopenia 2009    T scores - 1.5 DEXA 2009  . Screening for breast cancer May 2012   [facility] mammogram  . Tobacco abuse disorder   . Vitamin D deficiency April 2011   replaced withDrsido for level of 11.3 ng/ml  . Vitamin D deficiency  April 2011   replaced withDrsido for level of 11.3 ng/ml    Past Surgical History:  Procedure Laterality Date  . BREAST BIOPSY Left    benign  . CAROTID ENDARTERECTOMY Right 2016   Leotis Pain  . CATARACT EXTRACTION  Sept 2011  . CHOLECYSTECTOMY    . HERNIA REPAIR  Jun 2011   [name]   . TUBAL LIGATION       Social History   Tobacco Use  . Smoking status: Current Every Day Smoker    Packs/day: 1.04    Years: 40.00    Pack years: 41.60    Types: Cigarettes  . Smokeless tobacco: Never Used  Substance Use Topics  . Alcohol use: No  . Drug use: No    Family History  Problem Relation Age of Onset  . Hypertension Mother   . Arthritis  Mother   . Diabetes Father   . Dementia Sister     Allergies  Allergen Reactions  . Hctz [Hydrochlorothiazide]     Hyponatremia   . Other Cough    Other reaction(s): Other (See Comments) Running nose  . Pollen Extract     REVIEW OF SYSTEMS(Negative unless checked)  Constitutional: [] ?Weight loss[] ?Fever[] ?Chills Cardiac:[] ?Chest pain[] ?Chest pressure[] ?Palpitations [] ?Shortness of breath when laying flat [] ?Shortness of breath at rest [] ?Shortness of breath with exertion. Vascular: [] ?Pain in legs with walking[] ?Pain in legsat rest[] ?Pain in legs when laying flat [] ?Claudication [] ?Pain in feet when walking [] ?Pain in feet at rest [] ?Pain in feet when laying flat [] ?History of DVT [] ?Phlebitis [] ?Swelling in legs [] ?Varicose veins [] ?Non-healing ulcers Pulmonary: [] ?Uses home oxygen [] ?Productive cough[] ?Hemoptysis [] ?Wheeze [x] ?COPD [] ?Asthma Neurologic: [] ?Dizziness [] ?Blackouts [] ?Seizures [] ?History of stroke [] ?History of TIA[] ?Aphasia [] ?Temporary blindness[] ?Dysphagia [] ?Weaknessor numbness in arms [] ?Weakness or numbnessin legs Musculoskeletal: [x] ?Arthritis [] ?Joint swelling [] ?Joint pain [] ?Low back pain Hematologic:[] ?Easy bruising[] ?Easy bleeding [] ?Hypercoagulable state [x] ?Anemic [] ?Hepatitis Gastrointestinal:[] ?Blood in stool[] ?Vomiting blood[] ?Gastroesophageal reflux/heartburn[] ?Difficulty swallowing. Genitourinary: [] ?Chronic kidney disease [] ?Difficulturination [] ?Frequenturination [] ?Burning with urination[] ?Blood in urine Skin: [] ?Rashes [] ?Ulcers [] ?Wounds Psychological: [] ?History of anxiety[] ?History of major depression.  Physical Examination  Vitals:   09/13/19 1554  BP: (!) 146/73  Pulse: 71  Resp: 16  Weight: 145 lb (65.8 kg)   Body mass index is 24.5 kg/m. Gen:  WD/WN, NAD Head: [place]/AT, No temporalis wasting. Ear/Nose/Throat:  Hearing grossly intact, nares w/o erythema or drainage, trachea midline Eyes: Conjunctiva clear. Sclera non-icteric Neck: Supple.  Soft left carotid bruit  Pulmonary:  Good air movement, equal and clear to auscultation bilaterally.  Cardiac: RRR, No JVD Vascular:  Vessel Right Left  Radial Palpable Palpable               Musculoskeletal: M/S 5/5 throughout.  No deformity or atrophy. No LE edema. Neurologic: CN 2-12 intact. Sensation grossly intact in extremities.  Symmetrical.  Speech is fluent. Motor exam as listed above. Psychiatric: Judgment intact, Mood & affect appropriate for pt's clinical situation. Dermatologic: No rashes or ulcers noted.  No cellulitis or open wounds. Well healed right CEA site      CBC Lab Results  Component Value Date   WBC 10.2 06/08/2018   HGB 12.6 06/08/2018   HCT 36.6 06/08/2018   MCV 91.1 06/08/2018   PLT 304.0 06/08/2018    BMET    Component Value Date/Time   NA 131 (L) 05/26/2019 1318   NA 132 (L) 11/03/2014 0430   K 4.0 05/26/2019 1318   K 4.6 11/03/2014 0430   CL 98 05/26/2019 1318   CL 102 11/03/2014 0430   CO2 25 05/26/2019 1318  CO2 24 11/03/2014 0430   GLUCOSE 99 05/26/2019 1318   GLUCOSE 134 (H) 11/03/2014 0430   BUN 13 05/26/2019 1318   BUN 19 (H) 11/03/2014 0430   CREATININE 0.77 05/26/2019 1318   CREATININE 0.80 11/03/2014 0430   CALCIUM 8.8 05/26/2019 1318   CALCIUM 8.4 (L) 11/03/2014 0430   GFRNONAA >60 11/03/2014 0430   GFRAA >60 11/03/2014 0430   CrCl cannot be calculated (Patient's most recent lab result is older than the maximum 21 days allowed.).  COAG Lab Results  Component Value Date   INR 1.0 11/03/2014    Radiology No results found.   Assessment/Plan Essential hypertension blood pressure control important in reducing the progression of atherosclerotic disease. On appropriate oral medications.   Diabetes mellitus without complication (HCC) blood glucose control important in reducing the  progression of atherosclerotic disease. Also, involved in wound healing. On appropriate medications.   Hyperlipidemia lipid control important in reducing the progression of atherosclerotic disease. Continue statin therapy  Carotid stenosis Her duplex today shows a patent right carotid endarterectomy without significant recurrent stenosis.  Her left carotid artery stenosis is stable in the upper end of the 40 to 59% range without significant progression from her previous study. Continue current medical regimen and recheck in 1 year with carotid duplex.    Leotis Pain, MD  09/13/2019 4:22 PM    This note was created with Dragon medical transcription system.  Any errors from dictation are purely unintentional

## 2019-09-15 ENCOUNTER — Other Ambulatory Visit: Payer: Self-pay

## 2019-09-15 MED ORDER — SIMVASTATIN 20 MG PO TABS: 20.0000 mg | ORAL_TABLET | Freq: Every day | ORAL | 1 refills | Status: DC

## 2019-09-15 MED ORDER — LOSARTAN POTASSIUM 100 MG PO TABS: ORAL_TABLET | ORAL | 1 refills | Status: DC

## 2019-09-25 ENCOUNTER — Other Ambulatory Visit: Payer: Self-pay | Admitting: Internal Medicine

## 2019-09-28 ENCOUNTER — Other Ambulatory Visit: Payer: Self-pay | Admitting: Internal Medicine

## 2019-09-28 NOTE — Telephone Encounter (Signed)
Refilled: 05/26/2019 Last OV: 05/26/2019 Next OV: 10/12/2019

## 2019-09-29 ENCOUNTER — Ambulatory Visit: Payer: PPO | Attending: Internal Medicine

## 2019-09-29 ENCOUNTER — Other Ambulatory Visit: Payer: Self-pay

## 2019-09-29 DIAGNOSIS — Z20828 Contact with and (suspected) exposure to other viral communicable diseases: Secondary | ICD-10-CM | POA: Diagnosis not present

## 2019-09-29 DIAGNOSIS — Z20822 Contact with and (suspected) exposure to covid-19: Secondary | ICD-10-CM

## 2019-10-01 LAB — NOVEL CORONAVIRUS, NAA: SARS-CoV-2, NAA: NOT DETECTED

## 2019-10-12 ENCOUNTER — Encounter: Payer: Self-pay | Admitting: Internal Medicine

## 2019-10-12 ENCOUNTER — Other Ambulatory Visit: Payer: Self-pay

## 2019-10-12 ENCOUNTER — Telehealth: Payer: Self-pay | Admitting: Internal Medicine

## 2019-10-12 ENCOUNTER — Ambulatory Visit (INDEPENDENT_AMBULATORY_CARE_PROVIDER_SITE_OTHER): Payer: PPO | Admitting: Internal Medicine

## 2019-10-12 ENCOUNTER — Ambulatory Visit (INDEPENDENT_AMBULATORY_CARE_PROVIDER_SITE_OTHER): Payer: PPO

## 2019-10-12 VITALS — BP 135/71 | Ht 64.5 in | Wt 145.0 lb

## 2019-10-12 VITALS — Ht 64.5 in | Wt 145.0 lb

## 2019-10-12 DIAGNOSIS — E782 Mixed hyperlipidemia: Secondary | ICD-10-CM

## 2019-10-12 DIAGNOSIS — I10 Essential (primary) hypertension: Secondary | ICD-10-CM

## 2019-10-12 DIAGNOSIS — E119 Type 2 diabetes mellitus without complications: Secondary | ICD-10-CM

## 2019-10-12 DIAGNOSIS — Z0000 Encounter for general adult medical examination without abnormal findings: Secondary | ICD-10-CM

## 2019-10-12 DIAGNOSIS — E039 Hypothyroidism, unspecified: Secondary | ICD-10-CM | POA: Diagnosis not present

## 2019-10-12 NOTE — Assessment & Plan Note (Signed)
well-controlled on diet alone.  hemoglobin A1c has been consistently at or  less than 7.0 checked every 6 months  . Patient is up-to-date on eye exams and foot exam has been  normal  Patient has no proteinuria and is tolerating statin therapy for CAD risk reduction.  BP has been elevated on the last several visits,  Given her ongoing tobacco abuse, will recommend continued attempts at smoking cessation,  Continued use of daily aspirin  81 mg and 100 mg losartan .  Lab Results  Component Value Date   HGBA1C 5.4 05/26/2019   Lab Results  Component Value Date   MICROALBUR <0.7 11/18/2017

## 2019-10-12 NOTE — Patient Instructions (Addendum)
  Jane Davidson , Thank you for taking time to come for your Medicare Wellness Visit. I appreciate your ongoing commitment to your health goals. Please review the following plan we discussed and let me know if I can assist you in the future.   These are the goals we discussed: Goals      Patient Stated   . Quit Smoking (pt-stated)     Not ready to stop smoking today, but will try.        This is a list of the screening recommended for you and due dates:  Health Maintenance  Topic Date Due  . Complete foot exam   11/28/2019*  . Mammogram  10/23/2019  . Eye exam for diabetics  11/18/2019  . Hemoglobin A1C  11/26/2019  . Tetanus Vaccine  10/15/2021  . Flu Shot  Completed  . DEXA scan (bone density measurement)  Completed  . Pneumonia vaccines  Completed  *Topic was postponed. The date shown is not the original due date.

## 2019-10-12 NOTE — Progress Notes (Signed)
Virtual Visit converted to telephone Note  This visit type was conducted due to national recommendations for restrictions regarding the COVID-19 pandemic (e.g. social distancing).  This format is felt to be most appropriate for this patient at this time.  All issues noted in this document were discussed and addressed.  No physical exam was performed (except for noted visual exam findings with Video Visits).   I attempted to connect  with@ on 10/12/19 at 10:00 AM EST by a video enabled telemedicine application and verified that I am speaking with the correct person using two identifiers. Location patient: home Location provider: work or home office Persons participating in the virtual visit: patient, provider  Interactive audio and video telecommunications were attempted between this provider and patient, however failed, due to patient having technical difficulties OR patient did not have access to video capability.  We continued and completed visit with audio only.   I discussed the limitations, risks, security and privacy concerns of performing an evaluation and management service by telephone and the availability of in person appointments. I also discussed with the patient that there may be a patient responsible charge related to this service. The patient expressed understanding and agreed to proceed.   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  Symptoms still present, despite  Using zyrtec, benadryl  And flonase. covid negative.  No fevers,  Body aches , facial pain or ear pain.   Going to grocery store.  Went to [employer] to walk around for  A  few minutes but after her symptoms started,  Sleeping well,  But feels depressed at times and has resumed  smoking due to nerves . Spent Christmas with daughter and felt better for a while Reports lack  of energy.   HTN: 162/[id number]  This   morning  Right after taking losartan . Patient called back several hours later and BP was 135/71    She  feels generally well,  checking blood sugars once or twice weekly at variable times.  BS have been under 130 fasting and < 150 post prandially.  Denies any recent hypoglyemic events.   Following a carbohydrate modified diet 6 days per week. Denies numbness, burning and tingling of extremities. Appetite is good.     ROS: See pertinent positives and negatives per HPI.  Past Medical History:  Diagnosis Date  . Abnormal Pap smear of cervix 2001   Biopsy normal  . Bursitis NEC   . COPD (chronic obstructive pulmonary disease) ([place])   . History of peripheral vascular disease 2001   s/p bilateral aorto-iliac-femoral bypass /[facility]  . Hyperlipidemia   . hypothyroid   . Osteopenia 2009    T scores - 1.5 DEXA 2009  . Screening for breast cancer May 2012   [facility] mammogram  . Tobacco abuse disorder   . Vitamin D deficiency April 2011   replaced withDrsido for level of 11.3 ng/ml  . Vitamin D deficiency April 2011   replaced withDrsido for level of 11.3 ng/ml    Past Surgical History:  Procedure Laterality Date  . BREAST BIOPSY Left    benign  . CAROTID ENDARTERECTOMY Right 2016   Leotis Pain  . CATARACT EXTRACTION  Sept 2011  . CHOLECYSTECTOMY    . HERNIA REPAIR  Jun 2011  [name]   . TUBAL LIGATION      Family History  Problem Relation Age of Onset  . Hypertension Mother   . Arthritis Mother   . Diabetes Father   . Dementia Sister     SOCIAL HX:  reports that she has been smoking cigarettes. She has a 41.60 pack-year smoking history. She has never used smokeless tobacco. She reports that she does not drink alcohol or use drugs.   Current Outpatient Medications:  .  ADVAIR DISKUS 100-50 MCG/DOSE AEPB, INHALE 1 PUFF BY MOUTH TWICE DAILY, Disp: 60 each, Rfl: 2 .  amLODipine (NORVASC) 2.5 MG tablet, Take 1 tablet (2.5 mg total) by mouth  daily., Disp: 90 tablet, Rfl: 3 .  augmented betamethasone dipropionate (DIPROLENE-AF) 0.05 % cream, , Disp: , Rfl: 1 .  Cholecalciferol (VITAMIN D3) 1000 UNITS CAPS, Take 1 capsule by mouth daily., Disp: , Rfl:  .  clobetasol ointment (TEMOVATE) [id number] %, Apply 1 application topically 2 (two) times daily., Disp: 30 g, Rfl: 5 .  fluticasone (FLONASE) 50 MCG/ACT nasal spray, SHAKE LIQUID AND USE 2 SPRAYS IN EACH NOSTRIL DAILY, Disp: 16 g, Rfl: 2 .  levothyroxine (SYNTHROID) 100 MCG tablet, Take 1 tablet (100 mcg total) by mouth daily., Disp: 90 tablet, Rfl: 2 .  loratadine (CLARITIN) 10 MG tablet, Take 1 tablet (10 mg total) by mouth daily., Disp: 30 tablet, Rfl: 11 .  losartan (COZAAR) 100 MG tablet, TAKE 1 TABLET(100 MG) BY MOUTH DAILY, Disp: 90 tablet, Rfl: 1 .  metoprolol succinate (TOPROL-XL) 25 MG 24 hr tablet, TAKE 1 TABLET(25 MG) BY MOUTH DAILY, Disp: 90 tablet, Rfl: 3 .  nitroGLYCERIN (NITROSTAT) 0.4 MG SL tablet, Place 1 tablet (0.4 mg total) under the tongue every 5 (five) minutes as needed for chest pain., Disp: 50 tablet, Rfl: 3 .  sertraline (ZOLOFT) 50 MG tablet, Take 1 tablet (50 mg total) by mouth daily., Disp: 90 tablet, Rfl: 1 .  simvastatin (ZOCOR) 20 MG tablet, Take 1 tablet (20 mg total) by mouth daily., Disp: 90 tablet, Rfl: 1 .  traMADol (ULTRAM) 50 MG tablet, TAKE 1 TABLET(50 MG) BY MOUTH EVERY 6 HOURS AS NEEDED, Disp: 120 tablet, Rfl: 0 .  triamcinolone cream (KENALOG) 0.1 %, Apply 1 application topically 2 (two) times daily., Disp: 30 g, Rfl: 0  EXAM:   General impression: alert, cooperative and articulate.  No signs of being in distress  Lungs: speech is fluent sentence length suggests that patient is not short of breath and not punctuated by cough, sneezing or sniffing. Marland Kitchen   Psych: affect normal.  speech is articulate and non pressured .  Denies suicidal thoughts    ASSESSMENT AND PLAN:  Discussed the following assessment and plan:  Diabetes mellitus without  complication ([place]) - Plan: Hemoglobin A1c, Microalbumin / creatinine urine ratio  Essential hypertension - Plan: Comprehensive metabolic panel  Mixed hyperlipidemia - Plan: Lipid panel  Acquired hypothyroidism - Plan: TSH  Diabetes mellitus without complication (HCC)  well-controlled on diet alone.  hemoglobin A1c has been consistently at or  less than 7.0 checked every 6 months  . Patient is up-to-date on eye exams and foot exam has been  normal  Patient has no proteinuria and is tolerating statin therapy for CAD risk reduction.  BP has been elevated on the last several visits,  Given her ongoing tobacco abuse, will recommend continued attempts at smoking cessation,  Continued use of daily aspirin  81 mg and 100 mg losartan .  Lab Results  Component Value Date   HGBA1C 5.4 05/26/2019   Lab Results  Component Value Date   MICROALBUR <0.7 11/18/2017     Essential hypertension Well controlled on current regimen. Renal function is due , no changes today.  Hyperlipidemia Managed with simvastatin.  Goal LDL is 70    I discussed the assessment and treatment plan with the patient. The patient was provided an opportunity to ask questions and all were answered. The patient agreed with the plan and demonstrated an understanding of the instructions.   The patient was advised to call back or seek an in-person evaluation if the symptoms worsen or if the condition fails to improve as anticipated.  I provided  23 minutes of non-face-to-face time during this encounter reviewing patient's current problems and past procedures/imaging studies, providing counseling on the above mentioned problems , and coordination  of care .   Crecencio Mc, MD

## 2019-10-12 NOTE — Assessment & Plan Note (Signed)
Managed with simvastatin.  Goal LDL is 70

## 2019-10-12 NOTE — Progress Notes (Addendum)
Subjective:   Jane Davidson is a 78 y.o. female who presents for Medicare Annual (Subsequent) preventive examination.  Review of Systems:  No ROS.  Medicare Wellness Virtual Visit.  Visual/audio telehealth visit, UTA vital signs.   Wt/Ht provided by patient. See social history for additional risk factors.   Cardiac Risk Factors include: advanced age (>32men, >79 women);smoking/ tobacco exposure;hypertension;diabetes mellitus     Objective:     Vitals: Ht 5' 4.5" (1.638 m)   Wt 145 lb (65.8 kg)   BMI 24.50 kg/m   Body mass index is 24.5 kg/m.  Advanced Directives 10/12/2019 10/07/2018 10/07/2017 07/07/2017 12/06/2016 10/03/2016 08/29/2016  Does Patient Have a Medical Advance Directive? Yes Yes Yes Yes Yes Yes Yes  Type of Paramedic of [place];Living will [place];Living will Living will;Healthcare Power of Attorney Living will [place];Living will [place];Living will  Does patient want to make changes to medical advance directive? No - Patient declined No - Patient declined - - - No - Patient declined -  Copy of [place] in Chart? No - copy requested No - copy requested No - copy requested - - No - copy requested -    Tobacco Social History   Tobacco Use  Smoking Status Current Every Day Smoker  . Packs/day: 1.04  . Years: 40.00  . Pack years: 41.60  . Types: Cigarettes  Smokeless Tobacco Never Used     Ready to quit: Not Answered Counseling given: Not Answered   Clinical Intake:  Pre-visit preparation completed: Yes        Diabetes: Yes(Followed by pcp)           Past Medical History:  Diagnosis Date  . Abnormal Pap smear of cervix 2001   Biopsy normal  . Bursitis NEC   . COPD (chronic obstructive pulmonary disease) ([place])   . History of peripheral vascular disease 2001   s/p bilateral aorto-iliac-femoral bypass  /[place]  . Hyperlipidemia   . hypothyroid   . Osteopenia 2009    T scores - 1.5 DEXA 2009  . Screening for breast cancer May 2012   [place] mammogram  . Tobacco abuse disorder   . Vitamin D deficiency April 2011   replaced withDrsido for level of 11.3 ng/ml  . Vitamin D deficiency April 2011   replaced withDrsido for level of 11.3 ng/ml   Past Surgical History:  Procedure Laterality Date  . BREAST BIOPSY Left    benign  . CAROTID ENDARTERECTOMY Right 2016   Leotis Pain  . CATARACT EXTRACTION  Sept 2011  . CHOLECYSTECTOMY    . HERNIA REPAIR  Jun 2011   [name]   . TUBAL LIGATION     Family History  Problem Relation Age of Onset  . Hypertension Mother   . Arthritis Mother   . Diabetes Father   . Dementia Sister    Social History   Socioeconomic History  . Marital status: Widowed    Spouse name: Not on file  . Number of children: Not on file  . Years of education: Not on file  . Highest education level: Not on file  Occupational History  . Not on file  Tobacco Use  . Smoking status: Current Every Day Smoker    Packs/day: 1.04    Years: 40.00    Pack years: 41.60    Types: Cigarettes  . Smokeless tobacco: Never Used  Substance and  Sexual Activity  . Alcohol use: No  . Drug use: No  . Sexual activity: Never  Other Topics Concern  . Not on file  Social History Narrative  . Not on file   Social Determinants of Health   Financial Resource Strain: Low Risk   . Difficulty of Paying Living Expenses: Not hard at all  Food Insecurity: No Food Insecurity  . Worried About Charity fundraiser in the Last Year: Never true  . Ran Out of Food in the Last Year: Never true  Transportation Needs:   . Lack of Transportation (Medical): Not on file  . Lack of Transportation (Non-Medical): Not on file  Physical Activity:   . Days of Exercise per Week: Not on file  . Minutes of Exercise per Session: Not on file  Stress: No Stress Concern Present  . Feeling of Stress : Only  a little  Social Connections: Unknown  . Frequency of Communication with Friends and Family: More than three times a week  . Frequency of Social Gatherings with Friends and Family: Once a week  . Attends Religious Services: 1 to 4 times per year  . Active Member of Clubs or Organizations: Yes  . Attends Archivist Meetings: Not on file  . Marital Status: Not on file    Outpatient Encounter Medications as of 10/12/2019  Medication Sig  . ADVAIR DISKUS 100-50 MCG/DOSE AEPB INHALE 1 PUFF BY MOUTH TWICE DAILY  . amLODipine (NORVASC) 2.5 MG tablet Take 1 tablet (2.5 mg total) by mouth daily.  Marland Kitchen augmented betamethasone dipropionate (DIPROLENE-AF) 0.05 % cream   . Cholecalciferol (VITAMIN D3) 1000 UNITS CAPS Take 1 capsule by mouth daily.  . clobetasol ointment (TEMOVATE) AB-123456789 % Apply 1 application topically 2 (two) times daily.  . fluticasone (FLONASE) 50 MCG/ACT nasal spray SHAKE LIQUID AND USE 2 SPRAYS IN EACH NOSTRIL DAILY  . levothyroxine (SYNTHROID) 100 MCG tablet Take 1 tablet (100 mcg total) by mouth daily.  Marland Kitchen loratadine (CLARITIN) 10 MG tablet Take 1 tablet (10 mg total) by mouth daily.  Marland Kitchen losartan (COZAAR) 100 MG tablet TAKE 1 TABLET(100 MG) BY MOUTH DAILY  . metoprolol succinate (TOPROL-XL) 25 MG 24 hr tablet TAKE 1 TABLET(25 MG) BY MOUTH DAILY  . nitroGLYCERIN (NITROSTAT) 0.4 MG SL tablet Place 1 tablet (0.4 mg total) under the tongue every 5 (five) minutes as needed for chest pain.  Marland Kitchen sertraline (ZOLOFT) 50 MG tablet Take 1 tablet (50 mg total) by mouth daily.  . simvastatin (ZOCOR) 20 MG tablet Take 1 tablet (20 mg total) by mouth daily.  . traMADol (ULTRAM) 50 MG tablet TAKE 1 TABLET(50 MG) BY MOUTH EVERY 6 HOURS AS NEEDED  . triamcinolone cream (KENALOG) 0.1 % Apply 1 application topically 2 (two) times daily.  . [DISCONTINUED] montelukast (SINGULAIR) 10 MG tablet Take 1 tablet (10 mg total) by mouth at bedtime.  . [DISCONTINUED] phenazopyridine (PYRIDIUM) 100 MG tablet  Take 1 tablet (100 mg total) by mouth 3 (three) times daily as needed for pain.   No facility-administered encounter medications on file as of 10/12/2019.    Activities of Daily Living In your present state of health, do you have any difficulty performing the following activities: 10/12/2019  Hearing? N  Vision? N  Difficulty concentrating or making decisions? N  Walking or climbing stairs? N  Dressing or bathing? N  Doing errands, shopping? N  Preparing Food and eating ? N  Using the Toilet? N  In the past six months,  have you accidently leaked urine? Y  Comment Managed with pad at night  Do you have problems with loss of bowel control? N  Managing your Medications? N  Managing your Finances? N  Housekeeping or managing your Housekeeping? N  Some recent data might be hidden    Patient Care Team: Crecencio Mc, MD as PCP - General (Internal Medicine)    Assessment:   This is a routine wellness examination for Jane Davidson.  Nurse connected with patient 10/12/19 at  9:30 AM EST by a telephone enabled telemedicine application and verified that I am speaking with the correct person using two identifiers. Patient stated full name and DOB. Patient gave permission to continue with virtual visit. Patient's location was at home and Nurse's location was at [place] office.   Patient is alert and oriented x3. Patient denies difficulty focusing or concentrating. Patient likes to read and listens to gospel preaching for brain stimulation.   Health Maintenance Due: -Foot Exam- followed by pcp -Hgb A1c- 05/26/19 (5.4) See completed HM at the end of note.   Eye: Visual acuity not assessed. Virtual visit. Followed by their ophthalmologist. Retinopathy- none reported.  Dental: Dentures- yes   Hearing: Demonstrates normal hearing during visit.  Safety:  Patient feels safe at home- yes Patient does have smoke detectors at home- yes Patient does wear sunscreen or protective clothing when  in direct sunlight - yes Patient does wear seat belt when in a moving vehicle - yes Patient drives- yes Adequate lighting in walkways free from debris- yes Grab bars and handrails used as appropriate- yes Ambulates with an assistive device- no Cell phone on person when ambulating outside of the home- yes  Social: Alcohol intake - no     Smoking history- current; not ready to quit Illicit drug use? none  Medication: Taking as directed and without issues.  Pill box in use -yes  Self managed - yes   Covid-19: Precautions and sickness symptoms discussed. Wears mask, social distancing, hand hygiene as appropriate.   Activities of Daily Living Patient denies needing assistance with: household chores, feeding themselves, getting from bed to chair, getting to the toilet, bathing/showering, dressing, managing money, or preparing meals.   Discussed the importance of a healthy diet, water intake and the benefits of aerobic exercise.   Physical activity- active around the home, no routine.  Diet:  Regular Water: good intake Caffeine: 3 cups of coffee  Other Providers Patient Care Team: Crecencio Mc, MD as PCP - General (Internal Medicine)  Exercise Activities and Dietary recommendations Current Exercise Habits: The patient does not participate in regular exercise at present  Goals      Patient Stated   . Quit Smoking (pt-stated)     Not ready to stop smoking today, but will try.        Fall Risk Fall Risk  10/12/2019 10/07/2018 03/01/2018 10/07/2017 04/22/2017  Falls in the past year? 0 0 No No No  Number falls in past yr: - - - - -  Injury with Fall? - - - - -  Follow up Falls prevention discussed - - - -   Timed Get Up and Go performed: no, virtual visit  Depression Screen PHQ 2/9 Scores 10/12/2019 10/07/2018 03/01/2018 10/07/2017  PHQ - 2 Score 1 0 0 0  PHQ- 9 Score - - - 0     Cognitive Function MMSE - Mini Mental State Exam 10/12/2019 10/09/2015  Not  completed: Unable to complete -  Orientation to time -  5  Orientation to Place - 5  Registration - 3  Attention/ Calculation - 5  Recall - 3  Language- name 2 objects - 2  Language- repeat - 1  Language- follow 3 step command - 3  Language- read & follow direction - 1  Write a sentence - 1  Copy design - 1  Total score - 30     6CIT Screen 10/07/2018 10/07/2017 10/03/2016  What Year? 0 points 0 points 0 points  What month? 0 points 0 points 0 points  What time? 0 points 0 points 0 points  Count back from 20 0 points 0 points 0 points  Months in reverse 0 points 0 points 0 points  Repeat phrase 0 points 0 points -  Total Score 0 0 -    Immunization History  Administered Date(s) Administered  . Fluad Quad(high Dose 65+) 06/17/2019  . Influenza Split 07/05/2012, 07/27/2013, 06/29/2014  . Influenza, High Dose Seasonal PF 06/10/2017, 06/15/2018  . Influenza,inj,Quad PF,6+ Mos 06/04/2016  . Influenza-Unspecified 07/20/2015  . Pneumococcal Conjugate-13 04/06/2014  . Pneumococcal Polysaccharide-23 07/05/2012, 11/18/2017  . Tdap 10/16/2011  . Zoster 12/19/2011   Screening Tests Health Maintenance  Topic Date Due  . FOOT EXAM  11/28/2019 (Originally 06/16/2019)  . MAMMOGRAM  10/23/2019  . OPHTHALMOLOGY EXAM  11/18/2019  . HEMOGLOBIN A1C  11/26/2019  . TETANUS/TDAP  10/15/2021  . INFLUENZA VACCINE  Completed  . DEXA SCAN  Completed  . PNA vac Low Risk Adult  Completed      Plan:   Keep all routine maintenance appointments.   Follow up 10/12/19 @ 10:00  Medicare Attestation I have personally reviewed: The patient's medical and social history Their use of alcohol, tobacco or illicit drugs Their current medications and supplements The patient's functional ability including ADLs,fall risks, home safety risks, cognitive, and hearing and visual impairment Diet and physical activities Evidence for depression   I have reviewed and discussed with patient certain preventive  protocols, quality metrics, and best practice recommendations.     OBrien-Blaney, Brekyn Huntoon L, LPN  [id number]     I have reviewed the above information and agree with above.   [name], MD

## 2019-10-12 NOTE — Telephone Encounter (Signed)
Lm for patient to call office to set up a fasting lab, nurse visit for BP check in 4 weeks and a 34m follow up with Dr. Derrel Nip

## 2019-10-12 NOTE — Assessment & Plan Note (Signed)
Well controlled on current regimen. Renal function is due, no changes today. °

## 2019-10-12 NOTE — Telephone Encounter (Signed)
Patient had a virtual with Dr. Derrel Nip today. Dr. Derrel Nip wanted her to take her BP after virtual, it was 135/71.

## 2019-10-22 DIAGNOSIS — Z20822 Contact with and (suspected) exposure to covid-19: Secondary | ICD-10-CM | POA: Diagnosis not present

## 2019-10-22 DIAGNOSIS — Z1151 Encounter for screening for human papillomavirus (HPV): Secondary | ICD-10-CM | POA: Diagnosis not present

## 2019-10-25 DIAGNOSIS — L820 Inflamed seborrheic keratosis: Secondary | ICD-10-CM | POA: Diagnosis not present

## 2019-10-25 DIAGNOSIS — C44729 Squamous cell carcinoma of skin of left lower limb, including hip: Secondary | ICD-10-CM | POA: Diagnosis not present

## 2019-10-25 DIAGNOSIS — Z85828 Personal history of other malignant neoplasm of skin: Secondary | ICD-10-CM | POA: Diagnosis not present

## 2019-10-25 DIAGNOSIS — D485 Neoplasm of uncertain behavior of skin: Secondary | ICD-10-CM | POA: Diagnosis not present

## 2019-10-25 DIAGNOSIS — L538 Other specified erythematous conditions: Secondary | ICD-10-CM | POA: Diagnosis not present

## 2019-10-25 DIAGNOSIS — Z08 Encounter for follow-up examination after completed treatment for malignant neoplasm: Secondary | ICD-10-CM | POA: Diagnosis not present

## 2019-10-25 DIAGNOSIS — R208 Other disturbances of skin sensation: Secondary | ICD-10-CM | POA: Diagnosis not present

## 2019-10-25 DIAGNOSIS — L853 Xerosis cutis: Secondary | ICD-10-CM | POA: Diagnosis not present

## 2019-11-07 ENCOUNTER — Other Ambulatory Visit: Payer: Self-pay

## 2019-11-07 DIAGNOSIS — C44729 Squamous cell carcinoma of skin of left lower limb, including hip: Secondary | ICD-10-CM | POA: Diagnosis not present

## 2019-11-09 ENCOUNTER — Other Ambulatory Visit (INDEPENDENT_AMBULATORY_CARE_PROVIDER_SITE_OTHER): Payer: PPO

## 2019-11-09 ENCOUNTER — Other Ambulatory Visit: Payer: Self-pay

## 2019-11-09 ENCOUNTER — Ambulatory Visit (INDEPENDENT_AMBULATORY_CARE_PROVIDER_SITE_OTHER): Payer: PPO

## 2019-11-09 VITALS — BP 122/68 | HR 81

## 2019-11-09 DIAGNOSIS — E119 Type 2 diabetes mellitus without complications: Secondary | ICD-10-CM | POA: Diagnosis not present

## 2019-11-09 DIAGNOSIS — E039 Hypothyroidism, unspecified: Secondary | ICD-10-CM | POA: Diagnosis not present

## 2019-11-09 DIAGNOSIS — E782 Mixed hyperlipidemia: Secondary | ICD-10-CM

## 2019-11-09 DIAGNOSIS — I10 Essential (primary) hypertension: Secondary | ICD-10-CM

## 2019-11-09 LAB — MICROALBUMIN / CREATININE URINE RATIO
Creatinine,U: 110.7 mg/dL
Microalb Creat Ratio: 1.7 mg/g (ref 0.0–30.0)
Microalb, Ur: 1.9 mg/dL (ref 0.0–1.9)

## 2019-11-09 LAB — LIPID PANEL
Cholesterol: 144 mg/dL (ref 0–200)
HDL: 58.5 mg/dL (ref >39.00)
LDL Cholesterol: 71 mg/dL (ref 0–99)
NonHDL: 85.52
Total CHOL/HDL Ratio: 2
Triglycerides: 73 mg/dL (ref 0.0–149.0)
VLDL: 14.6 mg/dL (ref 0.0–40.0)

## 2019-11-09 LAB — COMPREHENSIVE METABOLIC PANEL
ALT: 12 U/L (ref 0–35)
AST: 18 U/L (ref 0–37)
Albumin: 4 g/dL (ref 3.5–5.2)
Alkaline Phosphatase: 66 U/L (ref 39–117)
BUN: 13 mg/dL (ref 6–23)
CO2: 27 mEq/L (ref 19–32)
Calcium: 9.2 mg/dL (ref 8.4–10.5)
Chloride: 99 mEq/L (ref 96–112)
Creatinine, Ser: 0.82 mg/dL (ref 0.40–1.20)
GFR: 67.58 mL/min (ref >60.00)
Glucose, Bld: 139 mg/dL — ABNORMAL HIGH (ref 70–99)
Potassium: 4.2 mEq/L (ref 3.5–5.1)
Sodium: 130 mEq/L — ABNORMAL LOW (ref 135–145)
Total Bilirubin: 0.4 mg/dL (ref 0.2–1.2)
Total Protein: 7 g/dL (ref 6.0–8.3)

## 2019-11-09 LAB — TSH: TSH: 0.67 u[IU]/mL (ref 0.35–4.50)

## 2019-11-09 LAB — HEMOGLOBIN A1C: Hgb A1c MFr Bld: 6 % (ref 4.6–6.5)

## 2019-11-09 NOTE — Progress Notes (Addendum)
Pt presented today for a blood pressure check. Pt is currently taking Amlodipine 2.5mg  once daily, losartan 100mg  once daily and metoprolol 25mg  once daily. BP in left arm was 126/78 pulse 73 and in the right arm bp was 122/68 pulse 81. Patient was advised to continue current medications.   Agree and f/u with PCP  [place]

## 2019-11-10 ENCOUNTER — Telehealth: Payer: Self-pay | Admitting: *Deleted

## 2019-11-10 NOTE — Telephone Encounter (Signed)
Contacted patient in attempt to schedule annual lung screening scan. Patient refuses to schedule lung screening and says she will contact me in the future if she would like to schedule lung screening.

## 2019-11-23 DIAGNOSIS — F333 Major depressive disorder, recurrent, severe with psychotic symptoms: Secondary | ICD-10-CM | POA: Diagnosis not present

## 2019-12-05 ENCOUNTER — Other Ambulatory Visit: Payer: Self-pay | Admitting: Internal Medicine

## 2019-12-05 DIAGNOSIS — Z1231 Encounter for screening mammogram for malignant neoplasm of breast: Secondary | ICD-10-CM

## 2019-12-29 ENCOUNTER — Other Ambulatory Visit: Payer: Self-pay | Admitting: Internal Medicine

## 2020-01-05 ENCOUNTER — Telehealth: Payer: Self-pay | Admitting: Internal Medicine

## 2020-01-05 ENCOUNTER — Other Ambulatory Visit: Payer: Self-pay

## 2020-01-05 NOTE — Telephone Encounter (Signed)
Patient has an appointment on 01/10/20, screened patient. She stated her upper legs ache from pass surgery and has allergies this time of year. Can patient come into office?

## 2020-01-05 NOTE — Telephone Encounter (Signed)
Yes she can be seen in person

## 2020-01-10 ENCOUNTER — Ambulatory Visit (INDEPENDENT_AMBULATORY_CARE_PROVIDER_SITE_OTHER): Payer: PPO | Admitting: Internal Medicine

## 2020-01-10 ENCOUNTER — Encounter: Payer: Self-pay | Admitting: Internal Medicine

## 2020-01-10 ENCOUNTER — Other Ambulatory Visit: Payer: Self-pay

## 2020-01-10 VITALS — BP 122/64 | HR 78 | Temp 97.1°F | Resp 15 | Ht 64.5 in | Wt 145.8 lb

## 2020-01-10 DIAGNOSIS — E119 Type 2 diabetes mellitus without complications: Secondary | ICD-10-CM

## 2020-01-10 DIAGNOSIS — E1151 Type 2 diabetes mellitus with diabetic peripheral angiopathy without gangrene: Secondary | ICD-10-CM | POA: Diagnosis not present

## 2020-01-10 DIAGNOSIS — Z716 Tobacco abuse counseling: Secondary | ICD-10-CM

## 2020-01-10 DIAGNOSIS — D649 Anemia, unspecified: Secondary | ICD-10-CM

## 2020-01-10 DIAGNOSIS — E7800 Pure hypercholesterolemia, unspecified: Secondary | ICD-10-CM

## 2020-01-10 DIAGNOSIS — E034 Atrophy of thyroid (acquired): Secondary | ICD-10-CM | POA: Diagnosis not present

## 2020-01-10 NOTE — Progress Notes (Signed)
Subjective:  Patient ID: Jane Davidson, female    DOB: 04/23/1941  Age: 79 y.o. MRN: [id number]  CC: The primary encounter diagnosis was Pure hypercholesterolemia. Diagnoses of Hypothyroidism due to acquired atrophy of thyroid, Anemia, unspecified type, Diabetes mellitus without complication ([place]), Tobacco abuse counseling, and Type 2 diabetes mellitus with diabetic peripheral angiopathy without gangrene, without long-term current use of insulin ([place]) were also pertinent to this visit.  HPI Jane Davidson presents for follow up  On chronic issues.  This visit occurred during the SARS-CoV-2 public health emergency.  Safety protocols were in place, including screening questions prior to the visit, additional usage of staff PPE, and extensive cleaning of exam room while observing appropriate contact time as indicated for disinfecting solutions.      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  Taking tylenol   Outpatient Medications Prior to Visit  Medication Sig Dispense Refill  . ADVAIR DISKUS 100-50 MCG/DOSE AEPB INHALE 1 PUFF BY MOUTH TWICE DAILY 60 each 2  . amLODipine (NORVASC) 2.5 MG tablet Take 1 tablet (2.5 mg total) by mouth daily. 90 tablet 3  . augmented betamethasone dipropionate (DIPROLENE-AF) 0.05 % cream   1  . Cholecalciferol (VITAMIN D3) 1000 UNITS CAPS Take 1 capsule by mouth daily.    . clobetasol ointment (TEMOVATE) AB-123456789 % Apply 1 application topically 2 (two) times daily. 30 g 5  . fluticasone (FLONASE) 50 MCG/ACT nasal spray SHAKE LIQUID AND USE 2 SPRAYS IN EACH NOSTRIL DAILY 16 g 2  . levothyroxine (SYNTHROID) 100 MCG tablet Take 1 tablet (100 mcg total) by mouth daily. 90 tablet 2  . loratadine (CLARITIN) 10 MG tablet Take 1 tablet (10 mg total) by mouth daily. 30 tablet 11  . losartan (COZAAR) 100 MG  tablet TAKE 1 TABLET(100 MG) BY MOUTH DAILY 90 tablet 1  . metoprolol succinate (TOPROL-XL) 25 MG 24 hr tablet TAKE 1 TABLET(25 MG) BY MOUTH DAILY 90 tablet 3  . nitroGLYCERIN (NITROSTAT) 0.4 MG SL tablet Place 1 tablet (0.4 mg total) under the tongue every 5 (five) minutes as needed for chest pain. 50 tablet 3  . sertraline (ZOLOFT) 50 MG tablet Take 1 tablet (50 mg total) by mouth daily. 90 tablet 1  . simvastatin (ZOCOR) 20 MG tablet Take 1 tablet (20 mg total) by mouth daily. 90 tablet 1  . traMADol (ULTRAM) 50 MG tablet TAKE 1 TABLET(50 MG) BY MOUTH EVERY 6 HOURS AS NEEDED 120 tablet 0  . triamcinolone cream (KENALOG) 0.1 % Apply 1 application topically 2 (two) times daily. 30 g 0   No facility-administered medications prior to visit.    Review of Systems;  Patient denies headache, fevers, malaise, unintentional weight loss, skin rash, eye pain, sinus congestion and sinus pain, sore throat, dysphagia,  hemoptysis , cough, dyspnea, wheezing, chest pain, palpitations, orthopnea, edema, abdominal pain, nausea, melena, diarrhea, constipation, flank pain, dysuria, hematuria, urinary  Frequency, nocturia, numbness, tingling, seizures,  Focal weakness, Loss of consciousness,  Tremor, insomnia, depression, anxiety, and suicidal ideation.      Objective:  BP 122/64 (BP Location: Left Arm, Patient Position: Sitting, Cuff Size: Normal)   Pulse 78   Temp (!) 97.1 F (36.2 C) (Temporal)   Resp 15   Ht 5' 4.5" (1.638 m)   Wt 145 lb 12.8 oz (  66.1 kg)   SpO2 98%   BMI 24.64 kg/m   BP Readings from Last 3 Encounters:  01/10/20 122/64  11/09/19 122/68  10/12/19 135/71    Wt Readings from Last 3 Encounters:  01/10/20 145 lb 12.8 oz (66.1 kg)  10/12/19 145 lb (65.8 kg)  10/12/19 145 lb (65.8 kg)    General appearance: alert, cooperative and appears stated age Ears: normal TM's and external ear canals both ears Throat: lips, mucosa, and tongue normal; teeth and gums normal Neck: no  adenopathy, no carotid bruit, supple, symmetrical, trachea midline and thyroid not enlarged, symmetric, no tenderness/mass/nodules Back: symmetric, no curvature. ROM normal. No CVA tenderness. Lungs: clear to auscultation bilaterally Heart: regular rate and rhythm, S1, S2 normal, no murmur, click, rub or gallop Abdomen: soft, non-tender; bowel sounds normal; no masses,  no organomegaly Pulses: 2+ and symmetric Skin: Skin color, texture, turgor normal. No rashes or lesions Lymph nodes: Cervical, supraclavicular, and axillary nodes normal.  Lab Results  Component Value Date   HGBA1C 6.0 11/09/2019   HGBA1C 5.4 05/26/2019   HGBA1C 5.8 06/08/2018    Lab Results  Component Value Date   CREATININE 0.82 11/09/2019   CREATININE 0.77 05/26/2019   CREATININE 0.75 06/28/2018    Lab Results  Component Value Date   WBC 10.2 06/08/2018   HGB 12.6 06/08/2018   HCT 36.6 06/08/2018   PLT 304.0 06/08/2018   GLUCOSE 139 (H) 11/09/2019   CHOL 144 11/09/2019   TRIG 73.0 11/09/2019   HDL 58.50 11/09/2019   LDLDIRECT 69.0 05/07/2017   LDLCALC 71 11/09/2019   ALT 12 11/09/2019   AST 18 11/09/2019   NA 130 (L) 11/09/2019   K 4.2 11/09/2019   CL 99 11/09/2019   CREATININE 0.82 11/09/2019   BUN 13 11/09/2019   CO2 27 11/09/2019   TSH 0.67 11/09/2019   INR 1.0 11/03/2014   HGBA1C 6.0 11/09/2019   MICROALBUR 1.9 11/09/2019    MM 3D SCREEN BREAST BILATERAL  Result Date: 10/22/2018 CLINICAL DATA:  Screening. EXAM: DIGITAL SCREENING BILATERAL MAMMOGRAM WITH TOMO AND CAD COMPARISON:  Previous exam(s). ACR Breast Density Category b: There are scattered areas of fibroglandular density. FINDINGS: There are no findings suspicious for malignancy. Images were processed with CAD. IMPRESSION: No mammographic evidence of malignancy. A result letter of this screening mammogram will be mailed directly to the patient. RECOMMENDATION: Screening mammogram in one year. (Code:SM-B-01Y) BI-RADS CATEGORY  1:  Negative. Electronically Signed   By: Lillia Mountain M.D.   On: 10/22/2018 13:52    Assessment & Plan:   Problem List Items Addressed This Visit      Unprioritized   Hyperlipidemia - Primary    Managed with simvastatin.   LDL is at goal of  70  Lab Results  Component Value Date   CHOL 144 11/09/2019   HDL 58.50 11/09/2019   LDLCALC 71 11/09/2019   LDLDIRECT 69.0 05/07/2017   TRIG 73.0 11/09/2019   CHOLHDL 2 11/09/2019        Relevant Orders   Lipid panel   Hypothyroidism    Thyroid function is WNL on current dose.  No current changes needed.   Lab Results  Component Value Date   TSH 0.67 11/09/2019         Relevant Orders   TSH   Tobacco abuse counseling    Smoking cessation instruction/counseling given.   Risks of continued tobacco use were discussed. She is not currently interested in tobacco cessation.  Anemia   Relevant Orders   CBC with Differential/Platelet   Diabetes mellitus without complication ([place])   Relevant Orders   Hemoglobin A1c   Comprehensive metabolic panel   Type 2 diabetes mellitus with diabetic peripheral angiopathy without gangrene, without long-term current use of insulin ([place])     Remains well-controlled on diet alone.  hemoglobin A1c has been consistently at or  less than 7.0 checked every 6 months  . Patient is up-to-date on eye exams and foot exam has been  normal  Patient has no proteinuria and is tolerating statin therapy for CAD risk reduction.  BP has been elevated on the last several visits,  Given her ongoing tobacco abuse, will recommend continued attempts at smoking cessation,  Continued use of daily aspirin  81 mg and 100 mg losartan .  Lab Results  Component Value Date   HGBA1C 6.0 11/09/2019   Lab Results  Component Value Date   MICROALBUR 1.9 11/09/2019            I provided  30 minutes of  face-to-face time during this encounter reviewing patient's current problems and past surgeries, labs and imaging studies,  providing counseling on the above mentioned problems , and coordination  of care .  I am having Jane Davidson maintain her Vitamin D3, clobetasol ointment, sertraline, loratadine, augmented betamethasone dipropionate, nitroGLYCERIN, amLODipine, triamcinolone cream, metoprolol succinate, levothyroxine, losartan, simvastatin, Advair Diskus, traMADol, and fluticasone.  No orders of the defined types were placed in this encounter.   There are no discontinued medications.  Follow-up: Return in about 6 months (around 07/12/2020).   Crecencio Mc, MD

## 2020-01-10 NOTE — Patient Instructions (Addendum)
Your skin is itching because it is DRY.  You need  eucerin,  cetaphil or aveeno  AS YOUR DAILY  moisturizer  After your shower  YOU CAN USE BENADRYL OINTMENT FOR THE ITCHING  IF NO RELIEF IN A WEEK  CALL FOR STEROID CREAM  REPEAT LABS DUE IN LATE July   START WALKING DAILY TO KEEP YOUR MUSCLES TONED

## 2020-01-11 ENCOUNTER — Encounter: Payer: Self-pay | Admitting: Internal Medicine

## 2020-01-11 NOTE — Assessment & Plan Note (Signed)
Currently asymptomatic .  Continue Advair, Flonase and claritin

## 2020-01-11 NOTE — Assessment & Plan Note (Signed)
Managed with simvastatin.   LDL is at goal of  70  Lab Results  Component Value Date   CHOL 144 11/09/2019   HDL 58.50 11/09/2019   LDLCALC 71 11/09/2019   LDLDIRECT 69.0 05/07/2017   TRIG 73.0 11/09/2019   CHOLHDL 2 11/09/2019

## 2020-01-11 NOTE — Assessment & Plan Note (Signed)
Smoking cessation instruction/counseling given.   Risks of continued tobacco use were discussed. She is not currently interested in tobacco cessation.  

## 2020-01-11 NOTE — Assessment & Plan Note (Signed)
Thyroid function is WNL on current dose.  No current changes needed.   Lab Results  Component Value Date   TSH 0.67 11/09/2019

## 2020-01-11 NOTE — Assessment & Plan Note (Signed)
Remains well-controlled on diet alone.  hemoglobin A1c has been consistently at or  less than 7.0 checked every 6 months  . Patient is up-to-date on eye exams and foot exam has been  normal  Patient has no proteinuria and is tolerating statin therapy for CAD risk reduction.  BP has been elevated on the last several visits,  Given her ongoing tobacco abuse, will recommend continued attempts at smoking cessation,  Continued use of daily aspirin  81 mg and 100 mg losartan .  Lab Results  Component Value Date   HGBA1C 6.0 11/09/2019   Lab Results  Component Value Date   MICROALBUR 1.9 11/09/2019

## 2020-02-06 ENCOUNTER — Ambulatory Visit
Admission: RE | Admit: 2020-02-06 | Discharge: 2020-02-06 | Disposition: A | Discharge status: Home / Self Care | Payer: PPO | Source: Ambulatory Visit | Attending: Internal Medicine | Admitting: Internal Medicine

## 2020-02-06 DIAGNOSIS — Z1231 Encounter for screening mammogram for malignant neoplasm of breast: Secondary | ICD-10-CM | POA: Diagnosis not present

## 2020-02-06 HISTORY — DX: Malignant (primary) neoplasm, unspecified: C80.1

## 2020-03-23 ENCOUNTER — Other Ambulatory Visit: Payer: Self-pay | Admitting: Internal Medicine

## 2020-03-28 ENCOUNTER — Other Ambulatory Visit: Payer: Self-pay

## 2020-03-28 MED ORDER — SIMVASTATIN 20 MG PO TABS: 20.0000 mg | ORAL_TABLET | Freq: Every day | ORAL | 1 refills | Status: DC

## 2020-03-30 ENCOUNTER — Telehealth: Payer: Self-pay | Admitting: Internal Medicine

## 2020-03-30 MED ORDER — LOSARTAN POTASSIUM 100 MG PO TABS: ORAL_TABLET | ORAL | 1 refills | Status: DC

## 2020-03-30 NOTE — Telephone Encounter (Signed)
Pt needs a refill on losartan (COZAAR) 100 MG tablet sent to [facility]

## 2020-04-07 ENCOUNTER — Emergency Department: Payer: PPO

## 2020-04-07 ENCOUNTER — Observation Stay
Admission: EM | Admit: 2020-04-07 | Discharge: 2020-04-08 | Disposition: A | Discharge status: Home / Self Care | Payer: PPO | Attending: Internal Medicine | Admitting: Internal Medicine

## 2020-04-07 ENCOUNTER — Encounter: Payer: Self-pay | Admitting: Emergency Medicine

## 2020-04-07 ENCOUNTER — Other Ambulatory Visit: Payer: Self-pay

## 2020-04-07 DIAGNOSIS — Z7951 Long term (current) use of inhaled steroids: Secondary | ICD-10-CM | POA: Diagnosis not present

## 2020-04-07 DIAGNOSIS — F17210 Nicotine dependence, cigarettes, uncomplicated: Secondary | ICD-10-CM | POA: Diagnosis not present

## 2020-04-07 DIAGNOSIS — S30861A Insect bite (nonvenomous) of abdominal wall, initial encounter: Secondary | ICD-10-CM | POA: Diagnosis not present

## 2020-04-07 DIAGNOSIS — E785 Hyperlipidemia, unspecified: Secondary | ICD-10-CM | POA: Diagnosis present

## 2020-04-07 DIAGNOSIS — E039 Hypothyroidism, unspecified: Secondary | ICD-10-CM | POA: Diagnosis present

## 2020-04-07 DIAGNOSIS — Z85828 Personal history of other malignant neoplasm of skin: Secondary | ICD-10-CM | POA: Diagnosis not present

## 2020-04-07 DIAGNOSIS — Z79890 Hormone replacement therapy (postmenopausal): Secondary | ICD-10-CM | POA: Diagnosis not present

## 2020-04-07 DIAGNOSIS — J449 Chronic obstructive pulmonary disease, unspecified: Secondary | ICD-10-CM | POA: Insufficient documentation

## 2020-04-07 DIAGNOSIS — I4891 Unspecified atrial fibrillation: Principal | ICD-10-CM | POA: Insufficient documentation

## 2020-04-07 DIAGNOSIS — E1151 Type 2 diabetes mellitus with diabetic peripheral angiopathy without gangrene: Secondary | ICD-10-CM | POA: Insufficient documentation

## 2020-04-07 DIAGNOSIS — F419 Anxiety disorder, unspecified: Secondary | ICD-10-CM | POA: Insufficient documentation

## 2020-04-07 DIAGNOSIS — Z79899 Other long term (current) drug therapy: Secondary | ICD-10-CM | POA: Insufficient documentation

## 2020-04-07 DIAGNOSIS — F329 Major depressive disorder, single episode, unspecified: Secondary | ICD-10-CM | POA: Diagnosis not present

## 2020-04-07 DIAGNOSIS — I6529 Occlusion and stenosis of unspecified carotid artery: Secondary | ICD-10-CM | POA: Insufficient documentation

## 2020-04-07 DIAGNOSIS — M545 Low back pain: Secondary | ICD-10-CM | POA: Insufficient documentation

## 2020-04-07 DIAGNOSIS — Z03818 Encounter for observation for suspected exposure to other biological agents ruled out: Secondary | ICD-10-CM | POA: Diagnosis not present

## 2020-04-07 DIAGNOSIS — F17200 Nicotine dependence, unspecified, uncomplicated: Secondary | ICD-10-CM | POA: Diagnosis not present

## 2020-04-07 DIAGNOSIS — I10 Essential (primary) hypertension: Secondary | ICD-10-CM | POA: Diagnosis not present

## 2020-04-07 DIAGNOSIS — Z20822 Contact with and (suspected) exposure to covid-19: Secondary | ICD-10-CM | POA: Insufficient documentation

## 2020-04-07 DIAGNOSIS — J811 Chronic pulmonary edema: Secondary | ICD-10-CM | POA: Diagnosis not present

## 2020-04-07 DIAGNOSIS — R002 Palpitations: Secondary | ICD-10-CM | POA: Diagnosis not present

## 2020-04-07 DIAGNOSIS — I480 Paroxysmal atrial fibrillation: Secondary | ICD-10-CM | POA: Diagnosis present

## 2020-04-07 DIAGNOSIS — Z8679 Personal history of other diseases of the circulatory system: Secondary | ICD-10-CM

## 2020-04-07 DIAGNOSIS — G8929 Other chronic pain: Secondary | ICD-10-CM | POA: Diagnosis not present

## 2020-04-07 LAB — CBC WITH DIFFERENTIAL/PLATELET
Abs Immature Granulocytes: 0.04 10*3/uL (ref 0.00–0.07)
Basophils Absolute: 0.1 10*3/uL (ref 0.0–0.1)
Basophils Relative: 1 %
Eosinophils Absolute: 0.1 10*3/uL (ref 0.0–0.5)
Eosinophils Relative: 1 %
HCT: 39.5 % (ref 36.0–46.0)
Hemoglobin: 13.6 g/dL (ref 12.0–15.0)
Immature Granulocytes: 1 %
Lymphocytes Relative: 13 %
Lymphs Abs: 1.1 10*3/uL (ref 0.7–4.0)
MCH: 31.2 pg (ref 26.0–34.0)
MCHC: 34.4 g/dL (ref 30.0–36.0)
MCV: 90.6 fL (ref 80.0–100.0)
Monocytes Absolute: 0.6 10*3/uL (ref 0.1–1.0)
Monocytes Relative: 7 %
Neutro Abs: 6.7 10*3/uL (ref 1.7–7.7)
Neutrophils Relative %: 77 %
Platelets: 250 10*3/uL (ref 150–400)
RBC: 4.36 MIL/uL (ref 3.87–5.11)
RDW: 12.9 % (ref 11.5–15.5)
WBC: 8.6 10*3/uL (ref 4.0–10.5)
nRBC: 0 % (ref 0.0–0.2)

## 2020-04-07 LAB — T4, FREE: Free T4: 1.24 ng/dL — ABNORMAL HIGH (ref 0.61–1.12)

## 2020-04-07 LAB — TROPONIN I (HIGH SENSITIVITY)
Troponin I (High Sensitivity): 6 ng/L (ref <18)
Troponin I (High Sensitivity): 8 ng/L (ref <18)

## 2020-04-07 LAB — BASIC METABOLIC PANEL
Anion gap: 10 (ref 5–15)
BUN: 12 mg/dL (ref 8–23)
CO2: 23 mmol/L (ref 22–32)
Calcium: 9.2 mg/dL (ref 8.9–10.3)
Chloride: 99 mmol/L (ref 98–111)
Creatinine, Ser: 0.77 mg/dL (ref 0.44–1.00)
GFR calc Af Amer: >60 mL/min (ref >60)
GFR calc non Af Amer: >60 mL/min (ref >60)
Glucose, Bld: 167 mg/dL — ABNORMAL HIGH (ref 70–99)
Potassium: 3.9 mmol/L (ref 3.5–5.1)
Sodium: 132 mmol/L — ABNORMAL LOW (ref 135–145)

## 2020-04-07 LAB — TSH: TSH: 0.189 u[IU]/mL — ABNORMAL LOW (ref 0.350–4.500)

## 2020-04-07 LAB — MAGNESIUM: Magnesium: 2 mg/dL (ref 1.7–2.4)

## 2020-04-07 LAB — HEMOGLOBIN A1C
Hgb A1c MFr Bld: 5.8 % — ABNORMAL HIGH (ref 4.8–5.6)
Mean Plasma Glucose: 119.76 mg/dL

## 2020-04-07 LAB — SARS CORONAVIRUS 2 (TAT 6-24 HRS): SARS Coronavirus 2: NEGATIVE

## 2020-04-07 MED ORDER — METOPROLOL TARTRATE 5 MG/5ML IV SOLN
5.0000 mg | INTRAVENOUS | Status: AC | PRN
  Administered 2020-04-07 (×3): 5 mg via INTRAVENOUS
  Filled 2020-04-07 (×3): qty 5

## 2020-04-07 MED ORDER — SIMVASTATIN 20 MG PO TABS
20.0000 mg | ORAL_TABLET | Freq: Every day | ORAL | Status: DC
  Administered 2020-04-07: 20 mg via ORAL
  Filled 2020-04-07: qty 1

## 2020-04-07 MED ORDER — MOMETASONE FURO-FORMOTEROL FUM 100-5 MCG/ACT IN AERO
2.0000 | INHALATION_SPRAY | Freq: Two times a day (BID) | RESPIRATORY_TRACT | Status: DC
  Administered 2020-04-08: 2 via RESPIRATORY_TRACT
  Filled 2020-04-07: qty 8.8

## 2020-04-07 MED ORDER — LOSARTAN POTASSIUM 50 MG PO TABS
100.0000 mg | ORAL_TABLET | Freq: Every day | ORAL | Status: DC
  Administered 2020-04-08: 100 mg via ORAL
  Filled 2020-04-07: qty 2

## 2020-04-07 MED ORDER — SERTRALINE HCL 50 MG PO TABS
50.0000 mg | ORAL_TABLET | Freq: Every day | ORAL | Status: DC
  Administered 2020-04-07: 50 mg via ORAL
  Filled 2020-04-07: qty 1

## 2020-04-07 MED ORDER — APIXABAN 5 MG PO TABS
5.0000 mg | ORAL_TABLET | Freq: Two times a day (BID) | ORAL | Status: DC
  Administered 2020-04-08: 5 mg via ORAL
  Filled 2020-04-07: qty 1

## 2020-04-07 MED ORDER — LACTATED RINGERS IV BOLUS
1000.0000 mL | Freq: Once | INTRAVENOUS | Status: AC
  Administered 2020-04-07: 1000 mL via INTRAVENOUS

## 2020-04-07 MED ORDER — LEVOTHYROXINE SODIUM 88 MCG PO TABS
88.0000 ug | ORAL_TABLET | Freq: Every day | ORAL | Status: DC
  Administered 2020-04-08: 88 ug via ORAL
  Filled 2020-04-07: qty 1

## 2020-04-07 MED ORDER — ALPRAZOLAM 0.25 MG PO TABS: 0.2500 mg | ORAL_TABLET | Freq: Two times a day (BID) | ORAL | Status: DC | PRN

## 2020-04-07 MED ORDER — ACETAMINOPHEN 325 MG PO TABS: 650.0000 mg | ORAL_TABLET | ORAL | Status: DC | PRN

## 2020-04-07 MED ORDER — HYDROCORTISONE 1 % EX CREA
TOPICAL_CREAM | Freq: Four times a day (QID) | CUTANEOUS | Status: DC
  Filled 2020-04-07: qty 28

## 2020-04-07 MED ORDER — DILTIAZEM HCL 25 MG/5ML IV SOLN
10.0000 mg | Freq: Once | INTRAVENOUS | Status: AC
  Administered 2020-04-07: 10 mg via INTRAVENOUS
  Filled 2020-04-07: qty 5

## 2020-04-07 MED ORDER — ONDANSETRON HCL 4 MG/2ML IJ SOLN: 4.0000 mg | Freq: Four times a day (QID) | INTRAMUSCULAR | Status: DC | PRN

## 2020-04-07 MED ORDER — DILTIAZEM HCL-DEXTROSE 125-5 MG/125ML-% IV SOLN (PREMIX)
5.0000 mg/h | INTRAVENOUS | Status: DC
  Administered 2020-04-07: 5 mg/h via INTRAVENOUS
  Filled 2020-04-07: qty 125

## 2020-04-07 NOTE — ED Notes (Signed)
MD notified of conversion of heart rate at this time. Repeat EKG obtained, Dr. Posey Pronto states to titrate cardizem drip down and eventually wean off when able. Cardizem drip decreased to 5 mg/hr.

## 2020-04-07 NOTE — ED Notes (Signed)
Pt's heart rate in the 120's at this time after 2nd PRN dose of metoprolol.

## 2020-04-07 NOTE — ED Notes (Signed)
Pt denies cp, fevers, flu like symptoms or pain near tick bite, just itchiness. Patient denies shob.

## 2020-04-07 NOTE — ED Provider Notes (Signed)
[facility] Emergency Department Provider Note   ____________________________________________   First MD Initiated Contact with Patient 04/07/20 1512     (approximate)  I have reviewed the triage vital signs and the nursing notes.   HISTORY  Chief Complaint Tick Removal    HPI Jane Davidson is a 79 y.o. female with past medical history of hyperlipidemia, hypothyroidism, and diabetes who presents to the ED complaining of tick bite.  Patient reports that she noticed multiple ticks on her body recently, she was able to remove one from her neck and her back, also noticed one to her upper abdomen earlier today.  She had some itchiness to the area along with some mild redness, but denies any fever or pain.  She was able to remove the body of the tick, but still feels like the head is embedded.  She was noted to be tachycardic in triage and admits that she has been having feelings of her heart racing since earlier today.  She denies any history of abnormal heart rhythms including atrial fibrillation.  She denies any chest pain, shortness of breath, or cough.  She takes metoprolol for her blood pressure but states she has not yet taken it today.  She does take medicine for hypothyroidism, has not had any recent changes in her dose, and denies any alcohol abuse.        Past Medical History:  Diagnosis Date  . Abnormal Pap smear of cervix 2001   Biopsy normal  . Bursitis NEC   . Cancer ([place])    skin  . COPD (chronic obstructive pulmonary disease) ([place])   . History of peripheral vascular disease 2001   s/p bilateral aorto-iliac-femoral bypass /[place]  . Hyperlipidemia   . hypothyroid   . Osteopenia 2009    T scores - 1.5 DEXA 2009  . Screening for breast cancer May 2012   [place] mammogram  . Tobacco abuse disorder   . Vitamin D deficiency April 2011   replaced withDrsido for level of 11.3 ng/ml  . Vitamin D deficiency April 2011   replaced withDrsido for  level of 11.3 ng/ml    Patient Active Problem List   Diagnosis Date Noted  . Atrial fibrillation with rapid ventricular response ([place]) 04/07/2020  . Abnormal x-ray of cervical spine 05/28/2019  . Type 2 diabetes mellitus with diabetic peripheral angiopathy without gangrene, without long-term current use of insulin ([place]) 05/28/2019  . Chest pain at rest 08/15/2018  . Hyponatremia 06/11/2018  . Osteoarthritis of spine 03/01/2018  . Chronic low back pain without sciatica 03/01/2018  . Spinal stenosis of lumbosacral region 03/01/2018  . Lumbar herniated disc 03/01/2018  . Cardiac murmur 03/01/2018  . Nausea 03/01/2018  . Rhinorrhea 09/25/2017  . Dysuria 06/10/2017  . Lumbar radiculopathy 06/04/2017  . Atopic dermatitis 04/22/2017  . Carotid stenosis 08/29/2016  . Personal history of tobacco use, presenting hazards to health 06/18/2016  . Essential hypertension 02/29/2016  . Diabetes mellitus without complication ([place]) 97/11/6376  . Chronic left hip pain 04/01/2015  . Constipation 06/08/2014  . Anemia 09/27/2013  . Encounter for preventive health examination 03/27/2013  . Screening for colon cancer 10/16/2011  . Tobacco abuse counseling 08/09/2011  . History of peripheral vascular disease   . Osteopenia   . Tobacco abuse disorder   . Vitamin D deficiency   . Arrhythmia 07/05/2011  . Hypothyroidism 07/05/2011  . 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 COPD (chronic obstructive pulmonary disease) (  [place])   . Hyperlipidemia   . Screening for breast cancer 02/11/2011    Past Surgical History:  Procedure Laterality Date  . BREAST BIOPSY Left    benign  . CAROTID ENDARTERECTOMY Right 2016   Leotis Pain  . CATARACT EXTRACTION  Sept 2011  . CHOLECYSTECTOMY    . HERNIA REPAIR  Jun 2011   [name]   . TUBAL LIGATION      Prior to Admission medications   Medication Sig Start Date End Date Taking? Authorizing Provider  ADVAIR DISKUS 100-50 MCG/DOSE AEPB  INHALE 1 PUFF BY MOUTH TWICE DAILY 09/26/19  Yes Crecencio Mc, MD  amLODipine (NORVASC) 2.5 MG tablet Take 1 tablet (2.5 mg total) by mouth daily. 05/26/19  Yes Crecencio Mc, MD  Cholecalciferol (VITAMIN D3) 1000 UNITS CAPS Take 1 capsule by mouth daily.   Yes [provider]  fluticasone (FLONASE) 50 MCG/ACT nasal spray SHAKE LIQUID AND USE 2 SPRAYS IN EACH NOSTRIL DAILY 03/26/20  Yes Crecencio Mc, MD  levothyroxine (SYNTHROID) 100 MCG tablet Take 1 tablet (100 mcg total) by mouth daily. 08/24/19  Yes Crecencio Mc, MD  losartan (COZAAR) 100 MG tablet TAKE 1 TABLET(100 MG) BY MOUTH DAILY 03/30/20  Yes Crecencio Mc, MD  metoprolol succinate (TOPROL-XL) 25 MG 24 hr tablet TAKE 1 TABLET(25 MG) BY MOUTH DAILY 07/19/19  Yes Crecencio Mc, MD  sertraline (ZOLOFT) 50 MG tablet Take 1 tablet (50 mg total) by mouth daily. 05/15/17  Yes Crecencio Mc, MD  simvastatin (ZOCOR) 20 MG tablet Take 1 tablet (20 mg total) by mouth daily. 03/28/20  Yes Crecencio Mc, MD  augmented betamethasone dipropionate (DIPROLENE-AF) 0.05 % cream  05/24/18   [provider]  clobetasol ointment (TEMOVATE) 6.23 % Apply 1 application topically 2 (two) times daily. Patient not taking: Reported on 04/07/2020 05/23/16   Crecencio Mc, MD  loratadine (CLARITIN) 10 MG tablet Take 1 tablet (10 mg total) by mouth daily. 09/25/17   Ria Bush, MD  nitroGLYCERIN (NITROSTAT) 0.4 MG SL tablet Place 1 tablet (0.4 mg total) under the tongue every 5 (five) minutes as needed for chest pain. 08/13/18   Crecencio Mc, MD  traMADol (ULTRAM) 50 MG tablet TAKE 1 TABLET(50 MG) BY MOUTH EVERY 6 HOURS AS NEEDED 09/28/19   Crecencio Mc, MD  triamcinolone cream (KENALOG) 0.1 % Apply 1 application topically 2 (two) times daily. Patient not taking: Reported on 04/07/2020 05/26/19   Crecencio Mc, MD    Allergies Pollen extract, Hctz [hydrochlorothiazide], and Other  Family History  Problem Relation Age of  Onset  . Hypertension Mother   . Arthritis Mother   . Diabetes Father   . Dementia Sister   . Breast cancer Neg Hx     Social History Social History   Tobacco Use  . Smoking status: Current Every Day Smoker    Packs/day: 1.04    Years: 40.00    Pack years: 41.60    Types: Cigarettes  . Smokeless tobacco: Never Used  Substance Use Topics  . Alcohol use: No  . Drug use: No    Review of Systems  Constitutional: No fever/chills Eyes: No visual changes. ENT: No sore throat. Cardiovascular: Denies chest pain.  Positive for palpitations. Respiratory: Denies shortness of breath. Gastrointestinal: No abdominal pain.  No nausea, no vomiting.  No diarrhea.  No constipation. Genitourinary: Negative for dysuria. Musculoskeletal: Negative for back pain. Skin: Negative for rash.  Positive for  tick bite. Neurological: Negative for headaches, focal weakness or numbness.  ____________________________________________   PHYSICAL EXAM:  VITAL SIGNS: ED Triage Vitals [04/07/20 1500]  Enc Vitals Group     BP      Pulse      Resp      Temp      Temp src      SpO2      Weight 140 lb (63.5 kg)     Height 5\' 4"  (1.626 m)     Head Circumference      Peak Flow      Pain Score 0     Pain Loc      Pain Edu?      Excl. in [place]?    Constitutional: Alert and oriented. Eyes: Conjunctivae are normal. Head: Atraumatic. Nose: No congestion/rhinnorhea. Mouth/Throat: Mucous membranes are moist. Neck: Normal ROM Cardiovascular: Tachycardic, irregularly irregular rhythm. Grossly normal heart sounds. Respiratory: Normal respiratory effort.  No retractions. Lungs CTAB. Gastrointestinal: Soft and nontender. No distention. Genitourinary: deferred Musculoskeletal: No lower extremity tenderness nor edema. Neurologic:  Normal speech and language. No gross focal neurologic deficits are appreciated. Skin:  Skin is warm, dry and intact.  Small area of raised erythematous rash to left upper abdomen  with central embedded tick head. Psychiatric: Mood and affect are normal. Speech and behavior are normal.  ____________________________________________   LABS (all labs ordered are listed, but only abnormal results are displayed)  Labs Reviewed  BASIC METABOLIC PANEL - Abnormal; Notable for the following components:      Result Value   Sodium 132 (*)    Glucose, Bld 167 (*)    All other components within normal limits  TSH - Abnormal; Notable for the following components:   TSH 0.189 (*)    All other components within normal limits  T4, FREE - Abnormal; Notable for the following components:   Free T4 1.24 (*)    All other components within normal limits  SARS CORONAVIRUS 2 (TAT 6-24 HRS)  CBC WITH DIFFERENTIAL/PLATELET  MAGNESIUM  CBC  BASIC METABOLIC PANEL  PROTIME-INR  HEMOGLOBIN A1C  TROPONIN I (HIGH SENSITIVITY)  TROPONIN I (HIGH SENSITIVITY)   ____________________________________________  EKG  ED ECG REPORT I, Blake Divine, the attending physician, personally viewed and interpreted this ECG.   Date: 04/07/2020  EKG Time: 15:04  Rate: 153  Rhythm: atrial fibrillation, rate 153  Axis: Normal  Intervals:none  ST&T Change: None   PROCEDURES  Procedure(s) performed (including Critical Care):  .Critical Care Performed by: Blake Divine, MD Authorized by: Blake Divine, MD   Critical care provider statement:    Critical care time (minutes):  45   Critical care time was exclusive of:  Separately billable procedures and treating other patients and teaching time   Critical care was necessary to treat or prevent imminent or life-threatening deterioration of the following conditions:  Cardiac failure   Critical care was time spent personally by me on the following activities:  Discussions with consultants, evaluation of patient's response to treatment, examination of patient, ordering and performing treatments and interventions, ordering and review of  laboratory studies, ordering and review of radiographic studies, pulse oximetry, re-evaluation of patient's condition, obtaining history from patient or surrogate and review of old charts   I assumed direction of critical care for this patient from another provider in my specialty: no   .1-3 Lead EKG Interpretation Performed by: Blake Divine, MD Authorized by: Blake Divine, MD     Interpretation: abnormal  ECG rate:  156   ECG rate assessment: tachycardic     Rhythm: atrial fibrillation     Ectopy: none     Conduction: normal       ____________________________________________   INITIAL IMPRESSION / ASSESSMENT AND PLAN / ED COURSE       79 year old female with possible history of hyperlipidemia, hypothyroidism, and diabetes presents to the ED complaining of tick bite to her upper abdomen that has been itchy and irritated, also notes palpitations starting earlier today.  She was noted to be tachycardic with irregularly irregular rhythm in triage, states she has had intermittent palpitations over the past few weeks that she has not seen anyone for.  EKG is consistent with atrial fibrillation with RVR, no apparent ischemic changes.  We will attempt to control rate with IV metoprolol pushes, but if this is unsuccessful we will plan to initiate Cardizem drip.  Lab work is pending, we will attempt removal of take it as well.  Patient with minimal improvement in heart rate following 5 mg of metoprolol x3.  She was started on Cardizem drip and heart rate is gradually improving to about 110.  Lab work is remarkable only for low TSH and mildly elevated free T4, patient likely supratherapeutic on thyroid supplementation, which could contribute to atrial fibrillation.  Case was discussed with hospitalist for admission.      ____________________________________________   FINAL CLINICAL IMPRESSION(S) / ED DIAGNOSES  Final diagnoses:  Atrial fibrillation with rapid ventricular response  [facility])     ED Discharge Orders         Ordered    Amb referral to [facility]     04/07/20 1922           Note:  This document was prepared using Dragon voice recognition software and may include unintentional dictation errors.   Blake Divine, MD 04/07/20 2121

## 2020-04-07 NOTE — ED Triage Notes (Signed)
Tick head stuck in pt.  Pt removed rest of tick. No pain, + itching.

## 2020-04-07 NOTE — ED Notes (Signed)
Pt's HR went from 140-150's to 130's after first dose of metoprolol.

## 2020-04-07 NOTE — ED Notes (Signed)
Pt's heart rate still in the 120's at this time. Will notify Dr. Charna Archer.

## 2020-04-07 NOTE — ED Triage Notes (Signed)
First nurse note- here because pulled a tick off her and reports the head is still in her.

## 2020-04-07 NOTE — ED Notes (Signed)
New fib rvr

## 2020-04-07 NOTE — ED Notes (Signed)
Dr. Patel at bedside 

## 2020-04-07 NOTE — H&P (Signed)
History and Physical    Jane Davidson HER:[id number] DOB: 09/30/1941 DOA: 04/07/2020  PCP: Crecencio Mc, MD  Patient coming from: Home  I have personally briefly reviewed patient's old medical records in [place]  Chief Complaint: Tick bite  HPI: Jane Davidson is a 79 y.o. female with medical history significant for COPD, PVD s/p bilateral aortoiliac femoral bypass, hypertension, hypothyroidism, hyperlipidemia, diet-controlled type 2 diabetes, and depression who initially presents to the ED for evaluation of a tick bite.  Patient states she has had 3 tick bites this last month to which she was able to remove herself.  She saw another tick bite on her abdomen earlier today.  She used tweezers to in an attempt to remove the take however was only able to remove the body with head reportedly still attached to her skin.  She says she became very anxious when she saw me head remaining.  She began to feel as if her heart was racing and skipping beats.  She has not had any significant fevers, chills, diaphoresis, chest pain, dyspnea, cough, abdominal pain, dysuria, diarrhea, new rash, or peripheral edema.  She has not seen any obvious bleeding.  She denies any recent travel to the [facility] or [place] area.  ED Course:  Initial vitals showed BP 139/89, pulse 121, RR 20, SPO2 99% on room air.  Labs are notable for sodium 132, potassium 3.9, bicarb 23, BUN 12, creatinine 0.77, serum glucose 167, WBC 8.6, hemoglobin 13.6, platelets 250,000, magnesium 2.0, high-sensitivity troponin I 6, TSH 0.189, free T4 1.24.  SARS-CoV-2 PCR is obtained and pending.  Portable chest x-ray shows hyperexpanded lung fields without focal consolidation, edema, or effusion.  EKG showed atrial fibrillation with RVR, rate 150s.  Patient was given 1 L LR, IV metoprolol 5 mg x 3, and subsequently placed on IV diltiazem infusion.  The hospitalist service was consulted to admit for further evaluation and  management.  Review of Systems: All systems reviewed and are negative except as documented in history of present illness above.   Past Medical History:  Diagnosis Date  . Abnormal Pap smear of cervix 2001   Biopsy normal  . Bursitis NEC   . Cancer ([place])    skin  . COPD (chronic obstructive pulmonary disease) ([place])   . History of peripheral vascular disease 2001   s/p bilateral aorto-iliac-femoral bypass /[facility]  . Hyperlipidemia   . hypothyroid   . Osteopenia 2009    T scores - 1.5 DEXA 2009  . Screening for breast cancer May 2012   [facility] mammogram  . Tobacco abuse disorder   . Vitamin D deficiency April 2011   replaced withDrsido for level of 11.3 ng/ml  . Vitamin D deficiency April 2011   replaced withDrsido for level of 11.3 ng/ml    Past Surgical History:  Procedure Laterality Date  . BREAST BIOPSY Left    benign  . CAROTID ENDARTERECTOMY Right 2016   Leotis Pain  . CATARACT EXTRACTION  Sept 2011  . CHOLECYSTECTOMY    . HERNIA REPAIR  Jun 2011   [name]   . TUBAL LIGATION      Social History:  reports that she has been smoking cigarettes. She has a 41.60 pack-year smoking history. She has never used smokeless tobacco. She reports that she does not drink alcohol and does not use drugs.  Allergies  Allergen Reactions  . Hctz [Hydrochlorothiazide]     Hyponatremia   . Other Cough  Other reaction(s): Other (See Comments) Running nose  . Pollen Extract     Family History  Problem Relation Age of Onset  . Hypertension Mother   . Arthritis Mother   . Diabetes Father   . Dementia Sister   . Breast cancer Neg Hx      Prior to Admission medications   Medication Sig Start Date End Date Taking? Authorizing Provider  ADVAIR DISKUS 100-50 MCG/DOSE AEPB INHALE 1 PUFF BY MOUTH TWICE DAILY 09/26/19   Crecencio Mc, MD  amLODipine (NORVASC) 2.5 MG tablet Take 1 tablet (2.5 mg total) by mouth daily. 05/26/19   Crecencio Mc, MD  augmented betamethasone  dipropionate (DIPROLENE-AF) 0.05 % cream  05/24/18   [provider]  Cholecalciferol (VITAMIN D3) 1000 UNITS CAPS Take 1 capsule by mouth daily.    [provider]  clobetasol ointment (TEMOVATE) 2.87 % Apply 1 application topically 2 (two) times daily. 05/23/16   Crecencio Mc, MD  fluticasone (FLONASE) 50 MCG/ACT nasal spray SHAKE LIQUID AND USE 2 SPRAYS IN EACH NOSTRIL DAILY 03/26/20   Crecencio Mc, MD  levothyroxine (SYNTHROID) 100 MCG tablet Take 1 tablet (100 mcg total) by mouth daily. 08/24/19   Crecencio Mc, MD  loratadine (CLARITIN) 10 MG tablet Take 1 tablet (10 mg total) by mouth daily. 09/25/17   Ria Bush, MD  losartan (COZAAR) 100 MG tablet TAKE 1 TABLET(100 MG) BY MOUTH DAILY 03/30/20   Crecencio Mc, MD  metoprolol succinate (TOPROL-XL) 25 MG 24 hr tablet TAKE 1 TABLET(25 MG) BY MOUTH DAILY 07/19/19   Crecencio Mc, MD  nitroGLYCERIN (NITROSTAT) 0.4 MG SL tablet Place 1 tablet (0.4 mg total) under the tongue every 5 (five) minutes as needed for chest pain. 08/13/18   Crecencio Mc, MD  sertraline (ZOLOFT) 50 MG tablet Take 1 tablet (50 mg total) by mouth daily. 05/15/17   Crecencio Mc, MD  simvastatin (ZOCOR) 20 MG tablet Take 1 tablet (20 mg total) by mouth daily. 03/28/20   Crecencio Mc, MD  traMADol (ULTRAM) 50 MG tablet TAKE 1 TABLET(50 MG) BY MOUTH EVERY 6 HOURS AS NEEDED 09/28/19   Crecencio Mc, MD  triamcinolone cream (KENALOG) 0.1 % Apply 1 application topically 2 (two) times daily. 05/26/19   Crecencio Mc, MD    Physical Exam: Vitals:   04/07/20 1626 04/07/20 1630 04/07/20 1645 04/07/20 1704  BP: 131/72 139/77 (!) 140/100 (!) 159/91  Pulse: 100 (!) 128 (!) 121   Resp: (!) 21 (!) 24 (!) 29 20  SpO2: 97% 93% 99%   Weight:      Height:       Constitutional: Resting supine in bed, NAD, calm, comfortable Eyes: PERRL, lids and conjunctivae normal ENMT: Mucous membranes are moist. Posterior pharynx clear of any exudate or  lesions.Normal dentition.  Neck: normal, supple, no masses. Respiratory: clear to auscultation bilaterally, no wheezing, no crackles. Normal respiratory effort. No accessory muscle use.  Cardiovascular: Tachycardic and irregularly irregular, no murmurs / rubs / gallops. No extremity edema. 2+ pedal pulses. Abdomen: no tenderness, no masses palpated. No hepatosplenomegaly. Bowel sounds positive.  Musculoskeletal: no clubbing / cyanosis. No joint deformity upper and lower extremities. Good ROM, no contractures. Normal muscle tone.  Skin: ~29mm linear left upper abdomen without obvious drainage, bleeding, or apparent tick remnant still attached, no surrounding erythema or rash Neurologic: CN 2-12 grossly intact. Sensation intact, Strength 5/5 in all 4.  Psychiatric: Normal judgment and insight. Alert  and oriented x 3.  Somewhat anxious but otherwise normal mood.   Labs on Admission: I have personally reviewed following labs and imaging studies  CBC: Recent Labs  Lab 04/07/20 1522  WBC 8.6  NEUTROABS 6.7  HGB 13.6  HCT 39.5  MCV 90.6  PLT 409   Basic Metabolic Panel: Recent Labs  Lab 04/07/20 1522  NA 132*  K 3.9  CL 99  CO2 23  GLUCOSE 167*  BUN 12  CREATININE 0.77  CALCIUM 9.2  MG 2.0   GFR: Estimated Creatinine Clearance: 50.9 mL/min (by C-G formula based on SCr of 0.77 mg/dL). Liver Function Tests: No results for input(s): AST, ALT, ALKPHOS, BILITOT, PROT, ALBUMIN in the last 168 hours. No results for input(s): LIPASE, AMYLASE in the last 168 hours. No results for input(s): AMMONIA in the last 168 hours. Coagulation Profile: No results for input(s): INR, PROTIME in the last 168 hours. Cardiac Enzymes: No results for input(s): CKTOTAL, CKMB, CKMBINDEX, TROPONINI in the last 168 hours. BNP (last 3 results) No results for input(s): PROBNP in the last 8760 hours. HbA1C: No results for input(s): HGBA1C in the last 72 hours. CBG: No results for input(s): GLUCAP in the  last 168 hours. Lipid Profile: No results for input(s): CHOL, HDL, LDLCALC, TRIG, CHOLHDL, LDLDIRECT in the last 72 hours. Thyroid Function Tests: Recent Labs    04/07/20 1522  TSH 0.189*  FREET4 1.24*   Anemia Panel: No results for input(s): VITAMINB12, FOLATE, FERRITIN, TIBC, IRON, RETICCTPCT in the last 72 hours. Urine analysis:    Component Value Date/Time   COLORURINE YELLOW 06/10/2017 1121   APPEARANCEUR CLEAR 06/10/2017 1121   LABSPEC <=1.005 (A) 06/10/2017 1121   PHURINE 6.0 06/10/2017 1121   GLUCOSEU NEGATIVE 06/10/2017 1121   HGBUR NEGATIVE 06/10/2017 1121   BILIRUBINUR neg 08/27/2018 1538   KETONESUR NEGATIVE 06/10/2017 1121   PROTEINUR Negative 08/27/2018 1538   UROBILINOGEN 0.2 08/27/2018 1538   UROBILINOGEN 0.2 06/10/2017 1121   NITRITE neg 08/27/2018 1538   NITRITE NEGATIVE 06/10/2017 1121   LEUKOCYTESUR Negative 08/27/2018 1538    Radiological Exams on Admission: DG Chest Portable 1 View  Result Date: 04/07/2020 CLINICAL DATA:  Palpitations. Smoker. EXAM: PORTABLE CHEST 1 VIEW COMPARISON:  10/28/2018 FINDINGS: Normal sized heart. The lungs remain hyperexpanded and clear. Increased prominence of the pulmonary vasculature. No pleural fluid. Mild scoliosis. IMPRESSION: 1. Interval mild pulmonary vascular congestion. 2. Stable changes of COPD. Electronically Signed   By: Claudie Revering M.D.   On: 04/07/2020 15:50    EKG: Independently reviewed.  Atrial fibrillation with RVR, rate 153.  A. fib with RVR is new when compared to prior EKG from November 2019.  Assessment/Plan Principal Problem:   Atrial fibrillation with rapid ventricular response (HCC) Active Problems:   COPD (chronic obstructive pulmonary disease) (HCC)   Hyperlipidemia   Hypothyroidism   History of peripheral vascular disease   Essential hypertension   Type 2 diabetes mellitus with diabetic peripheral angiopathy without gangrene, without long-term current use of insulin (HCC)  Jane Davidson is a 79 y.o. female with medical history significant for COPD, PVD s/p bilateral aortoiliac femoral bypass, hypertension, hypothyroidism, hyperlipidemia, diet-controlled type 2 diabetes, and depression who is admitted with A. fib with RVR.  Atrial fibrillation with RVR: Remains in atrial fibrillation, currently on diltiazem infusion.  Possible due to slightly supratherapeutic levothyroxine dosing.  Some component of anxiety as well. CHA2DS2-VASc score is 5-6, discussed starting anticoagulation to reduce risk of stroke including possible adverse  side effect of bleeding.  Patient understanding and anticipate starting on Eliquis begin tomorrow. -Admit to stepdown unit -Continue IV diltiazem infusion, titrate off and transition to oral as able -Anticipate starting Eliquis 5 mg twice daily tomorrow -Obtain echocardiogram -Decrease levothyroxine dose from 100 mcg to 88 mcg, will need follow-up labs with PCP in 4-6 weeks -Hold home metoprolol and amlodipine while on diltiazem infusion  COPD: Chronic and stable without respiratory issue.  Continue Dulera.  Hypothyroidism: TSH suppressed at 0.189 with slightly elevated free T4 1.24. -Decrease levothyroxine dose to 88 mcg from previous 100 mcg as above  Hypertension: Continue losartan, holding home metoprolol and amlodipine while on diltiazem infusion.  Diet-controlled type 2 diabetes: Last A1c 6.0% on 11/09/2019.  Continue to monitor.  Peripheral vascular disease Hyperlipidemia: Continue simvastatin.  Depression: Continue sertraline.  DVT prophylaxis: Eliquis Code Status: Full code, confirmed with patient Family Communication: Discussed with patient's daughter at bedside Disposition Plan: From home and likely discharge to home pending adequate rate control and transition to oral medications Consults called: None Admission status:  Status is: Observation  The patient remains OBS appropriate and will d/c before 2  midnights.  Dispo: The patient is from: Home              Anticipated d/c is to: Home              Anticipated d/c date is: 1 day pending adequate rate control and transition to oral medications              Patient currently is not medically stable to d/c.   Zada Finders MD [facility]  If 7PM-7AM, please contact night-coverage [email or web address]  04/07/2020, 6:40 PM

## 2020-04-07 NOTE — ED Notes (Signed)
Patient ambulated to bedside toilet with this RN's standing by and while attached to cardiac monitors. Patient tolerated well, no assistance needed. Patient back in bed, VSS as before. Will continue to monitor.

## 2020-04-08 ENCOUNTER — Observation Stay (HOSPITAL_BASED_OUTPATIENT_CLINIC_OR_DEPARTMENT_OTHER)
Admit: 2020-04-08 | Discharge: 2020-04-08 | Disposition: A | Discharge status: Home / Self Care | Payer: PPO | Attending: Internal Medicine | Admitting: Internal Medicine

## 2020-04-08 DIAGNOSIS — I4891 Unspecified atrial fibrillation: Secondary | ICD-10-CM

## 2020-04-08 DIAGNOSIS — I361 Nonrheumatic tricuspid (valve) insufficiency: Secondary | ICD-10-CM

## 2020-04-08 LAB — CBC
HCT: 34.8 % — ABNORMAL LOW (ref 36.0–46.0)
Hemoglobin: 12.3 g/dL (ref 12.0–15.0)
MCH: 31.3 pg (ref 26.0–34.0)
MCHC: 35.3 g/dL (ref 30.0–36.0)
MCV: 88.5 fL (ref 80.0–100.0)
Platelets: 226 10*3/uL (ref 150–400)
RBC: 3.93 MIL/uL (ref 3.87–5.11)
RDW: 13 % (ref 11.5–15.5)
WBC: 8.3 10*3/uL (ref 4.0–10.5)
nRBC: 0 % (ref 0.0–0.2)

## 2020-04-08 LAB — GLUCOSE, CAPILLARY
Glucose-Capillary: 105 mg/dL — ABNORMAL HIGH (ref 70–99)
Glucose-Capillary: 107 mg/dL — ABNORMAL HIGH (ref 70–99)

## 2020-04-08 LAB — BASIC METABOLIC PANEL
Anion gap: 6 (ref 5–15)
BUN: 16 mg/dL (ref 8–23)
CO2: 25 mmol/L (ref 22–32)
Calcium: 9 mg/dL (ref 8.9–10.3)
Chloride: 105 mmol/L (ref 98–111)
Creatinine, Ser: 0.83 mg/dL (ref 0.44–1.00)
GFR calc Af Amer: >60 mL/min (ref >60)
GFR calc non Af Amer: >60 mL/min (ref >60)
Glucose, Bld: 99 mg/dL (ref 70–99)
Potassium: 4 mmol/L (ref 3.5–5.1)
Sodium: 136 mmol/L (ref 135–145)

## 2020-04-08 LAB — ECHOCARDIOGRAM COMPLETE
Height: 64 in
Weight: 2256 [oz_av]

## 2020-04-08 LAB — PROTIME-INR
INR: 1 (ref 0.8–1.2)
Prothrombin Time: 13.2 seconds (ref 11.4–15.2)

## 2020-04-08 MED ORDER — METOPROLOL TARTRATE 25 MG PO TABS
25.0000 mg | ORAL_TABLET | Freq: Two times a day (BID) | ORAL | Status: DC
  Administered 2020-04-08: 25 mg via ORAL
  Filled 2020-04-08: qty 1

## 2020-04-08 MED ORDER — APIXABAN 5 MG PO TABS: 5.0000 mg | ORAL_TABLET | Freq: Two times a day (BID) | ORAL | 0 refills | Status: DC

## 2020-04-08 MED ORDER — METOPROLOL TARTRATE 25 MG PO TABS: 25.0000 mg | ORAL_TABLET | Freq: Two times a day (BID) | ORAL | 0 refills | Status: DC

## 2020-04-08 MED ORDER — LEVOTHYROXINE SODIUM 75 MCG PO TABS: 75.0000 ug | ORAL_TABLET | Freq: Every day | ORAL | 0 refills | Status: DC

## 2020-04-08 NOTE — Discharge Summary (Signed)
Physician Discharge Summary  Jane Davidson LKG:[id number] DOB: [birth year] DOA: 04/07/2020  PCP: Crecencio Mc, MD  Admit date: 04/07/2020 Discharge date: 04/08/2020  Admitted From: Home  Disposition:  Home   Recommendations for Outpatient Follow-up:  1. Follow up with PCP in 1-2 weeks 2. We will send referral to A. fib clinic for follow-up.  Discharge Condition: Stable CODE STATUS: Full code Diet recommendation: Low-salt diet  Discharge summary: 79 year old female with history of COPD chronic and stable, peripheral vascular disease status post bilateral aortoiliac femoral bypass, hypertension, hypothyroidism and hyperlipidemia, diet-controlled diabetes last A1c 5.8 presented to the emergency room with a tick bite on her belly and unable to remove the head of the tick.  When she was not able to remove the head of the tick, she also started having palpitations sweating and EMS was called.  In the emergency room she was found with A. fib with RVR with heart rate more than 150 otherwise stable.  Paroxysmal A. fib with RVR: New onset.  Previously unknown.  Treated with Cardizem drip with immediate conversion to sinus rhythm.  Started on metoprolol, mobilized with no recurrence of symptoms or tachycardia in the telemetry. TSH level 0.18, over corrected. 2D echocardiogram was essentially normal with normal ejection fraction.  Plan: With adequate clinical improvement going home today.  She was on Toprol-XL, changed to short acting metoprolol 25 mg twice daily. Levothyroxine decreased from 170mcg to 75 mcg daily, recheck in 4 weeks. CHA2DS2-VASc score 6.  She will benefit with anticoagulation. Started on Eliquis, prescription sent and patient educated. Referral sent to A. fib clinic for follow-up.  CHA2DS2-VASc Score = 6  The patient's score is based upon: CHF History: 0 HTN History: 1 Age : 2 Diabetes History: 1 Stroke History: 0 Vascular Disease History: 1 Gender: 1  Patient is  scheduled to see her dermatologist tomorrow for her leg lesions, she will talk to them about removing tick head from the abdomen.  This may less likely needed, however dermatologist may have precise instruments to remove it.  Discharge Diagnoses:  Principal Problem:   Atrial fibrillation with rapid ventricular response (HCC) Active Problems:   COPD (chronic obstructive pulmonary disease) (HCC)   Hyperlipidemia   Hypothyroidism   History of peripheral vascular disease   Essential hypertension   Type 2 diabetes mellitus with diabetic peripheral angiopathy without gangrene, without long-term current use of insulin [facility])    Discharge Instructions  Discharge Instructions    Amb referral to AFIB Clinic   Complete by: As directed    Diet - low sodium heart healthy   Complete by: As directed    Increase activity slowly   Complete by: As directed      Allergies as of 04/08/2020      Reactions   Pollen Extract    Hctz [hydrochlorothiazide]    Hyponatremia   Other Cough   Other reaction(s): Other (See Comments) Running nose      Medication List    STOP taking these medications   metoprolol succinate 25 MG 24 hr tablet Commonly known as: TOPROL-XL     TAKE these medications   Advair Diskus 100-50 MCG/DOSE Aepb Generic drug: Fluticasone-Salmeterol INHALE 1 PUFF BY MOUTH TWICE DAILY   amLODipine 2.5 MG tablet Commonly known as: NORVASC Take 1 tablet (2.5 mg total) by mouth daily.   apixaban 5 MG Tabs tablet Commonly known as: ELIQUIS Take 1 tablet (5 mg total) by mouth 2 (two) times daily.   augmented betamethasone dipropionate  0.05 % cream Commonly known as: DIPROLENE-AF   clobetasol ointment 0.05 % Commonly known as: TEMOVATE Apply 1 application topically 2 (two) times daily.   fluticasone 50 MCG/ACT nasal spray Commonly known as: FLONASE SHAKE LIQUID AND USE 2 SPRAYS IN EACH NOSTRIL DAILY   levothyroxine 75 MCG tablet Commonly known as: Synthroid Take 1 tablet  (75 mcg total) by mouth daily. What changed:   medication strength  how much to take   loratadine 10 MG tablet Commonly known as: CLARITIN Take 1 tablet (10 mg total) by mouth daily.   losartan 100 MG tablet Commonly known as: COZAAR TAKE 1 TABLET(100 MG) BY MOUTH DAILY   metoprolol tartrate 25 MG tablet Commonly known as: LOPRESSOR Take 1 tablet (25 mg total) by mouth 2 (two) times daily.   nitroGLYCERIN 0.4 MG SL tablet Commonly known as: NITROSTAT Place 1 tablet (0.4 mg total) under the tongue every 5 (five) minutes as needed for chest pain.   sertraline 50 MG tablet Commonly known as: ZOLOFT Take 1 tablet (50 mg total) by mouth daily.   simvastatin 20 MG tablet Commonly known as: ZOCOR Take 1 tablet (20 mg total) by mouth daily.   traMADol 50 MG tablet Commonly known as: ULTRAM TAKE 1 TABLET(50 MG) BY MOUTH EVERY 6 HOURS AS NEEDED   triamcinolone cream 0.1 % Commonly known as: KENALOG Apply 1 application topically 2 (two) times daily.   Vitamin D3 25 MCG (1000 UT) Caps Take 1 capsule by mouth daily.       Allergies  Allergen Reactions  . Pollen Extract   . Hctz [Hydrochlorothiazide]     Hyponatremia   . Other Cough    Other reaction(s): Other (See Comments) Running nose    Procedures/Studies: DG Chest Portable 1 View  Result Date: 04/07/2020 CLINICAL DATA:  Palpitations. Smoker. EXAM: PORTABLE CHEST 1 VIEW COMPARISON:  10/28/2018 FINDINGS: Normal sized heart. The lungs remain hyperexpanded and clear. Increased prominence of the pulmonary vasculature. No pleural fluid. Mild scoliosis. IMPRESSION: 1. Interval mild pulmonary vascular congestion. 2. Stable changes of COPD. Electronically Signed   By: Claudie Revering M.D.   On: 04/07/2020 15:50   ECHOCARDIOGRAM COMPLETE  Result Date: 04/08/2020    ECHOCARDIOGRAM REPORT   Patient Name:   Jane Davidson Date of Exam: 04/08/2020 Medical Rec #:  [id number]        Height:       64.0 in Accession #:    [id number]        Weight:       141.0 lb Date of Birth:  [birth year]       BSA:          1.686 m Patient Age:    81 years         BP:           108/63 mmHg Patient Gender: F                HR:           67 bpm. Exam Location:  [facility] Procedure: 2D Echo Indications:     Atrial Fibrillation 427.31/ I48.91  History:         Patient has no prior history of Echocardiogram examinations.  Sonographer:     Arville Go RDCS Referring Phys:  [id number] Cleaster Corin PATEL Diagnosing Phys: Mertie Moores MD IMPRESSIONS  1. Left ventricular ejection fraction, by estimation, is 65 to 70%. The left ventricle has normal function. The left ventricle has no regional  wall motion abnormalities. There is moderate left ventricular hypertrophy. Left ventricular diastolic parameters were normal.  2. Right ventricular systolic function is normal. The right ventricular size is normal.  3. The mitral valve is grossly normal. No evidence of mitral valve regurgitation. No evidence of mitral stenosis.  4. The aortic valve is tricuspid. Aortic valve regurgitation is not visualized. Mild aortic valve sclerosis is present, with no evidence of aortic valve stenosis. FINDINGS  Left Ventricle: Left ventricular ejection fraction, by estimation, is 65 to 70%. The left ventricle has normal function. The left ventricle has no regional wall motion abnormalities. The left ventricular internal cavity size was normal in size. There is  moderate left ventricular hypertrophy. Left ventricular diastolic parameters were normal. Right Ventricle: The right ventricular size is normal. No increase in right ventricular wall thickness. Right ventricular systolic function is normal. Left Atrium: Left atrial size was normal in size. Right Atrium: Right atrial size was normal in size. Pericardium: There is no evidence of pericardial effusion. Mitral Valve: The mitral valve is grossly normal. No evidence of mitral valve regurgitation. No evidence of mitral valve stenosis. Tricuspid Valve:  The tricuspid valve is grossly normal. Tricuspid valve regurgitation is mild . No evidence of tricuspid stenosis. Aortic Valve: The aortic valve is tricuspid. Aortic valve regurgitation is not visualized. Mild aortic valve sclerosis is present, with no evidence of aortic valve stenosis. There is mild calcification of the aortic valve. Aortic valve peak gradient measures 7.8 mmHg. Pulmonic Valve: The pulmonic valve was grossly normal. Pulmonic valve regurgitation is not visualized. Aorta: The aortic root is normal in size and structure. IAS/Shunts: The atrial septum is grossly normal.  LEFT VENTRICLE PLAX 2D LVIDd:         3.75 cm  Diastology LVIDs:         2.61 cm  LV e' lateral:   8.27 cm/s LV PW:         1.36 cm  LV E/e' lateral: 10.4 LV IVS:        1.30 cm  LV e' medial:    7.83 cm/s LVOT diam:     2.00 cm  LV E/e' medial:  11.0 LV SV:         66 LV SV Index:   39 LVOT Area:     3.14 cm  RIGHT VENTRICLE RV Basal diam:  2.63 cm RV S prime:     14.80 cm/s TAPSE (M-mode): 2.9 cm LEFT ATRIUM             Index       RIGHT ATRIUM           Index LA diam:        4.00 cm 2.37 cm/m  RA Area:     12.30 cm LA Vol (A2C):   25.0 ml 14.83 ml/m RA Volume:   29.80 ml  17.67 ml/m LA Vol (A4C):   35.8 ml 21.23 ml/m LA Biplane Vol: 31.4 ml 18.62 ml/m  AORTIC VALVE                PULMONIC VALVE AV Area (Vmax): 1.84 cm    PV Vmax:       1.02 m/s AV Vmax:        139.50 cm/s PV Peak grad:  4.2 mmHg AV Peak Grad:   7.8 mmHg LVOT Vmax:      81.60 cm/s LVOT Vmean:     56.400 cm/s LVOT VTI:       0.209 m  AORTA Ao Root diam: 2.80 cm Ao Asc diam:  2.90 cm MITRAL VALVE               TRICUSPID VALVE MV Area (PHT): 2.99 cm    TV Peak grad:   26.7 mmHg MV Decel Time: 254 msec    TV Vmax:        2.58 m/s MV E velocity: 86.10 cm/s MV A velocity: 83.10 cm/s  SHUNTS MV E/A ratio:  1.04        Systemic VTI:  0.21 m                            Systemic Diam: 2.00 cm Mertie Moores MD Electronically signed by Mertie Moores MD Signature  Date/Time: 04/08/2020/3:07:06 PM    Final    (Echo, Carotid, EGD, Colonoscopy, ERCP)    Subjective: Patient was seen and examined.  She was again seen in the evening for discharge readiness.  Daughters at the bedside.  Patient herself denies any complaints. We discussed about new medications with family and they are educated about the medications.   Discharge Exam: Vitals:   04/08/20 0925 04/08/20 1203  BP: (!) 157/65 (!) 152/75  Pulse: 62 (!) 57  Resp: 18 18  Temp: 97.8 F (36.6 C) 98.4 F (36.9 C)  SpO2: 99% 97%   Vitals:   04/07/20 2112 04/08/20 0337 04/08/20 0925 04/08/20 1203  BP: 110/84 108/63 (!) 157/65 (!) 152/75  Pulse: 65 (!) 59 62 (!) 57  Resp: 18 20 18 18   Temp: 98.2 F (36.8 C) 97.9 F (36.6 C) 97.8 F (36.6 C) 98.4 F (36.9 C)  TempSrc: Oral Oral Oral Oral  SpO2: 99% 97% 99% 97%  Weight: 64 kg 64 kg    Height:        General: Pt is alert, awake, not in acute distress Cardiovascular: RRR, S1/S2 +, no rubs, no gallops Respiratory: CTA bilaterally, no wheezing, no rhonchi Abdominal: Soft, NT, ND, bowel sounds + Patient has a pinpoint itchy lesion on the left anterior abdominal wall. Extremities: no edema, no cyanosis    The results of significant diagnostics from this hospitalization (including imaging, microbiology, ancillary and laboratory) are listed below for reference.     Microbiology: Recent Results (from the past 240 hour(s))  SARS CORONAVIRUS 2 (TAT 6-24 HRS) Nasopharyngeal Nasopharyngeal Swab     Status: None   Collection Time: 04/07/20  4:22 PM   Specimen: Nasopharyngeal Swab  Result Value Ref Range Status   SARS Coronavirus 2 NEGATIVE NEGATIVE Final    Comment: (NOTE) SARS-CoV-2 target nucleic acids are NOT DETECTED.  The SARS-CoV-2 RNA is generally detectable in upper and lower respiratory specimens during the acute phase of infection. Negative results do not preclude SARS-CoV-2 infection, do not rule out co-infections with other  pathogens, and should not be used as the sole basis for treatment or other patient management decisions. Negative results must be combined with clinical observations, patient history, and epidemiological information. The expected result is Negative.  Fact Sheet for Patients: SugarRoll.be  Fact Sheet for Healthcare Providers: https://www.woods-mathews.com/  This test is not yet approved or cleared by the [place] FDA and  has been authorized for detection and/or diagnosis of SARS-CoV-2 by FDA under an Emergency Use Authorization (EUA). This EUA will remain  in effect (meaning this test can be used) for the duration of the COVID-19 declaration under Se ction 564(b)(1) of the Act, 21 U.S.C. section 360bbb-3(b)(1),  unless the authorization is terminated or revoked sooner.  Performed at [facility], [place] [address], [place], [place] [address]      Labs: BNP (last 3 results) No results for input(s): BNP in the last 8760 hours. Basic Metabolic Panel: Recent Labs  Lab 04/07/20 1522 04/08/20 0431  NA 132* 136  K 3.9 4.0  CL 99 105  CO2 23 25  GLUCOSE 167* 99  BUN 12 16  CREATININE 0.77 0.83  CALCIUM 9.2 9.0  MG 2.0  --    Liver Function Tests: No results for input(s): AST, ALT, ALKPHOS, BILITOT, PROT, ALBUMIN in the last 168 hours. No results for input(s): LIPASE, AMYLASE in the last 168 hours. No results for input(s): AMMONIA in the last 168 hours. CBC: Recent Labs  Lab 04/07/20 1522 04/08/20 0431  WBC 8.6 8.3  NEUTROABS 6.7  --   HGB 13.6 12.3  HCT 39.5 34.8*  MCV 90.6 88.5  PLT 250 226   Cardiac Enzymes: No results for input(s): CKTOTAL, CKMB, CKMBINDEX, TROPONINI in the last 168 hours. BNP: Invalid input(s): POCBNP CBG: Recent Labs  Lab 04/08/20 0754 04/08/20 1144  GLUCAP 105* 107*   D-Dimer No results for input(s): DDIMER in the last 72 hours. Hgb A1c Recent Labs    04/07/20 1522  HGBA1C 5.8*    Lipid Profile No results for input(s): CHOL, HDL, LDLCALC, TRIG, CHOLHDL, LDLDIRECT in the last 72 hours. Thyroid function studies Recent Labs    04/07/20 1522  TSH 0.189*   Anemia work up No results for input(s): VITAMINB12, FOLATE, FERRITIN, TIBC, IRON, RETICCTPCT in the last 72 hours. Urinalysis    Component Value Date/Time   COLORURINE YELLOW 06/10/2017 1121   APPEARANCEUR CLEAR 06/10/2017 1121   LABSPEC <=1.005 (A) 06/10/2017 1121   PHURINE 6.0 06/10/2017 1121   GLUCOSEU NEGATIVE 06/10/2017 1121   HGBUR NEGATIVE 06/10/2017 1121   BILIRUBINUR neg 08/27/2018 1538   KETONESUR NEGATIVE 06/10/2017 1121   PROTEINUR Negative 08/27/2018 1538   UROBILINOGEN 0.2 08/27/2018 1538   UROBILINOGEN 0.2 06/10/2017 1121   NITRITE neg 08/27/2018 1538   NITRITE NEGATIVE 06/10/2017 1121   LEUKOCYTESUR Negative 08/27/2018 1538   Sepsis Labs Invalid input(s): PROCALCITONIN,  WBC,  LACTICIDVEN Microbiology Recent Results (from the past 240 hour(s))  SARS CORONAVIRUS 2 (TAT 6-24 HRS) Nasopharyngeal Nasopharyngeal Swab     Status: None   Collection Time: 04/07/20  4:22 PM   Specimen: Nasopharyngeal Swab  Result Value Ref Range Status   SARS Coronavirus 2 NEGATIVE NEGATIVE Final    Comment: (NOTE) SARS-CoV-2 target nucleic acids are NOT DETECTED.  The SARS-CoV-2 RNA is generally detectable in upper and lower respiratory specimens during the acute phase of infection. Negative results do not preclude SARS-CoV-2 infection, do not rule out co-infections with other pathogens, and should not be used as the sole basis for treatment or other patient management decisions. Negative results must be combined with clinical observations, patient history, and epidemiological information. The expected result is Negative.  Fact Sheet for Patients: SugarRoll.be  Fact Sheet for Healthcare Providers: https://www.woods-mathews.com/  This test is not yet  approved or cleared by the [place] FDA and  has been authorized for detection and/or diagnosis of SARS-CoV-2 by FDA under an Emergency Use Authorization (EUA). This EUA will remain  in effect (meaning this test can be used) for the duration of the COVID-19 declaration under Se ction 564(b)(1) of the Act, 21 U.S.C. section 360bbb-3(b)(1), unless the authorization is terminated or revoked sooner.  Performed at [facility], [place] [address], [place], [place] [address]      Time coordinating discharge: 32 minutes  SIGNED:   Barb Merino, MD  [facility] 04/08/2020, 6:15 PM

## 2020-04-08 NOTE — Progress Notes (Signed)
   04/08/20 1125  Clinical Encounter Type  Visited With Patient and family together  Visit Type Initial  Referral From Patient  Consult/Referral To Chaplain  Spiritual Encounters  Spiritual Needs Emotional  Pt was alert and awake upon arrival. Had two family members present. Pt was talkative and able to express thoughts and needs freely. Reydon provided pastoral care through completing initial spiritual care screening. Pt has strong emotional and spiritual support system. Pt is hopeful about being discharged later today. No further needs expressed at this time.

## 2020-04-08 NOTE — Progress Notes (Signed)
Jane Davidson to be D/C'd Home per MD order.  Discussed prescriptions and follow up appointments with the patient. Prescriptions were sent to pharmacy, medication list explained in detail. Pt verbalized understanding.  Allergies as of 04/08/2020      Reactions   Pollen Extract    Hctz [hydrochlorothiazide]    Hyponatremia   Other Cough   Other reaction(s): Other (See Comments) Running nose      Medication List    STOP taking these medications   metoprolol succinate 25 MG 24 hr tablet Commonly known as: TOPROL-XL     TAKE these medications   Advair Diskus 100-50 MCG/DOSE Aepb Generic drug: Fluticasone-Salmeterol INHALE 1 PUFF BY MOUTH TWICE DAILY   amLODipine 2.5 MG tablet Commonly known as: NORVASC Take 1 tablet (2.5 mg total) by mouth daily.   apixaban 5 MG Tabs tablet Commonly known as: ELIQUIS Take 1 tablet (5 mg total) by mouth 2 (two) times daily.   augmented betamethasone dipropionate 0.05 % cream Commonly known as: DIPROLENE-AF   clobetasol ointment 0.05 % Commonly known as: TEMOVATE Apply 1 application topically 2 (two) times daily.   fluticasone 50 MCG/ACT nasal spray Commonly known as: FLONASE SHAKE LIQUID AND USE 2 SPRAYS IN EACH NOSTRIL DAILY   levothyroxine 75 MCG tablet Commonly known as: Synthroid Take 1 tablet (75 mcg total) by mouth daily. What changed:   medication strength  how much to take   loratadine 10 MG tablet Commonly known as: CLARITIN Take 1 tablet (10 mg total) by mouth daily.   losartan 100 MG tablet Commonly known as: COZAAR TAKE 1 TABLET(100 MG) BY MOUTH DAILY   metoprolol tartrate 25 MG tablet Commonly known as: LOPRESSOR Take 1 tablet (25 mg total) by mouth 2 (two) times daily.   nitroGLYCERIN 0.4 MG SL tablet Commonly known as: NITROSTAT Place 1 tablet (0.4 mg total) under the tongue every 5 (five) minutes as needed for chest pain.   sertraline 50 MG tablet Commonly known as: ZOLOFT Take 1 tablet (50 mg total)  by mouth daily.   simvastatin 20 MG tablet Commonly known as: ZOCOR Take 1 tablet (20 mg total) by mouth daily.   traMADol 50 MG tablet Commonly known as: ULTRAM TAKE 1 TABLET(50 MG) BY MOUTH EVERY 6 HOURS AS NEEDED   triamcinolone cream 0.1 % Commonly known as: KENALOG Apply 1 application topically 2 (two) times daily.   Vitamin D3 25 MCG (1000 UT) Caps Take 1 capsule by mouth daily.       Vitals:   04/08/20 0925 04/08/20 1203  BP: (!) 157/65 (!) 152/75  Pulse: 62 (!) 57  Resp: 18 18  Temp: 97.8 F (36.6 C) 98.4 F (36.9 C)  SpO2: 99% 97%    Tele box removed and returned.Skin clean, dry and intact without evidence of skin break down, no evidence of skin tears noted. IV catheter discontinued intact. Site without signs and symptoms of complications. Dressing and pressure applied. Pt denies pain at this time. No complaints noted.  An After Visit Summary was printed and given to the patient. Patient escorted via [place], and D/C home via private auto.  [name]

## 2020-04-08 NOTE — Plan of Care (Signed)

## 2020-04-08 NOTE — Plan of Care (Signed)

## 2020-04-08 NOTE — Progress Notes (Signed)
Cardizem drip at 5mg /hr for several hours, heart rate 58-65/min.  Cardizem stopped and hospitalist notified.  No new orders at this time.

## 2020-04-08 NOTE — Progress Notes (Signed)
*  PRELIMINARY RESULTS* Echocardiogram 2D Echocardiogram has been performed.  Jane Davidson 04/08/2020, 10:26 AM 

## 2020-04-09 ENCOUNTER — Telehealth: Payer: Self-pay

## 2020-04-09 DIAGNOSIS — Z85828 Personal history of other malignant neoplasm of skin: Secondary | ICD-10-CM | POA: Diagnosis not present

## 2020-04-09 DIAGNOSIS — Z08 Encounter for follow-up examination after completed treatment for malignant neoplasm: Secondary | ICD-10-CM | POA: Diagnosis not present

## 2020-04-09 DIAGNOSIS — L280 Lichen simplex chronicus: Secondary | ICD-10-CM | POA: Diagnosis not present

## 2020-04-09 DIAGNOSIS — Z1839 Other retained organic fragments: Secondary | ICD-10-CM | POA: Diagnosis not present

## 2020-04-09 DIAGNOSIS — L98499 Non-pressure chronic ulcer of skin of other sites with unspecified severity: Secondary | ICD-10-CM | POA: Diagnosis not present

## 2020-04-09 DIAGNOSIS — L820 Inflamed seborrheic keratosis: Secondary | ICD-10-CM | POA: Diagnosis not present

## 2020-04-09 DIAGNOSIS — D485 Neoplasm of uncertain behavior of skin: Secondary | ICD-10-CM | POA: Diagnosis not present

## 2020-04-09 NOTE — Telephone Encounter (Signed)
Failed attempt to reach patient for transition of care hospital follow up. Will follow as appropriate.

## 2020-04-10 DIAGNOSIS — I4891 Unspecified atrial fibrillation: Secondary | ICD-10-CM | POA: Diagnosis not present

## 2020-04-10 DIAGNOSIS — I6529 Occlusion and stenosis of unspecified carotid artery: Secondary | ICD-10-CM | POA: Diagnosis not present

## 2020-04-10 DIAGNOSIS — I10 Essential (primary) hypertension: Secondary | ICD-10-CM | POA: Diagnosis not present

## 2020-04-10 DIAGNOSIS — E119 Type 2 diabetes mellitus without complications: Secondary | ICD-10-CM | POA: Diagnosis not present

## 2020-04-10 NOTE — Telephone Encounter (Signed)
Transition Care Management Follow-up Telephone Call  Date of discharge and from where: 04/08/20 from [facility]  How have you been since you were released from the hospital? Denies chest pain, dizziness, no palpitations, sweating, diarrhea, vomiting. Intermittent nausea; has OTC nausea medication but has not felt the need to use it yet.   Any questions or concerns? None  Items Reviewed:  Did the pt receive and understand the discharge instructions provided? Yes, increase to activity tolerated.   Medications obtained and verified? Yes, discussed medications individually. Verbalizes understanding.   Any new allergies since your discharge? No  Dietary orders reviewed? Yes, low sodium diet, heart healthy  Do you have support at home? Lives alone, son lives nearby  Functional Questionnaire: (I = Independent and D = Dependent) ADLs: i  Bathing/Dressing- i  Meal Prep- i  Eating- i  Maintaining continence- i  Transferring/Ambulation- i  Managing Meds- i  Follow up appointments reviewed:   PCP Hospital f/u appt confirmed? Scheduled to see Dr. Derrel Nip on 04/13/20 @ 2:30.  [facility] f/u appt confirmed?  Seen Dr. Ubaldo Glassing today. Currently wearing heart monitor. No longer required to go to Afib clinic for follow up.   Are transportation arrangements needed? No  If their condition worsens, is the pt aware to call PCP or go to the Emergency Dept.? Yes  Was the patient provided with contact information for the PCP's office or ED? Yes  Was to pt encouraged to call back with questions or concerns? Yes

## 2020-04-11 ENCOUNTER — Telehealth (HOSPITAL_COMMUNITY): Payer: Self-pay | Admitting: Physician Assistant

## 2020-04-11 NOTE — Telephone Encounter (Signed)
Patient returned my call.   She declined appt with [facility] and states she is seeing Dr. Ubaldo Glassing.

## 2020-04-11 NOTE — Telephone Encounter (Signed)
Called and left message for patient to call back.  Need to schedule hosp f/u appt for patient.

## 2020-04-13 ENCOUNTER — Ambulatory Visit (INDEPENDENT_AMBULATORY_CARE_PROVIDER_SITE_OTHER): Payer: PPO | Admitting: Internal Medicine

## 2020-04-13 ENCOUNTER — Encounter: Payer: Self-pay | Admitting: Internal Medicine

## 2020-04-13 ENCOUNTER — Other Ambulatory Visit: Payer: Self-pay

## 2020-04-13 VITALS — BP 156/60 | HR 76 | Temp 98.2°F | Resp 15 | Ht 64.0 in | Wt 144.2 lb

## 2020-04-13 DIAGNOSIS — E039 Hypothyroidism, unspecified: Secondary | ICD-10-CM

## 2020-04-13 DIAGNOSIS — I4891 Unspecified atrial fibrillation: Secondary | ICD-10-CM | POA: Diagnosis not present

## 2020-04-13 DIAGNOSIS — S30861S Insect bite (nonvenomous) of abdominal wall, sequela: Secondary | ICD-10-CM

## 2020-04-13 DIAGNOSIS — D6869 Other thrombophilia: Secondary | ICD-10-CM

## 2020-04-13 DIAGNOSIS — Z09 Encounter for follow-up examination after completed treatment for conditions other than malignant neoplasm: Secondary | ICD-10-CM

## 2020-04-13 DIAGNOSIS — Z7901 Long term (current) use of anticoagulants: Secondary | ICD-10-CM | POA: Diagnosis not present

## 2020-04-13 DIAGNOSIS — E1151 Type 2 diabetes mellitus with diabetic peripheral angiopathy without gangrene: Secondary | ICD-10-CM | POA: Diagnosis not present

## 2020-04-13 DIAGNOSIS — I10 Essential (primary) hypertension: Secondary | ICD-10-CM | POA: Diagnosis not present

## 2020-04-13 DIAGNOSIS — W57XXXS Bitten or stung by nonvenomous insect and other nonvenomous arthropods, sequela: Secondary | ICD-10-CM

## 2020-04-13 MED ORDER — CEPHALEXIN 500 MG PO CAPS: 500.0000 mg | ORAL_CAPSULE | Freq: Four times a day (QID) | ORAL | 0 refills | Status: DC

## 2020-04-13 MED ORDER — DOXYCYCLINE HYCLATE 100 MG PO TABS: 100.0000 mg | ORAL_TABLET | Freq: Two times a day (BID) | ORAL | 0 refills | Status: DC

## 2020-04-13 NOTE — Patient Instructions (Addendum)
Keep your tick bite wound covered .  It looks slightly infected,  So please Start the antibiotic cephalexin  (keflex) tonight  For your mild skin infection .  It needs to be taken 4 times daily  With food  Daily use of Probiotics  for  3 weeks advised to reduce risk of C dificile colitis.  You can use yogurt as your probiotic.   I will check your thyroid and tick antibodies in 6 weeks   [facility] Spotted Fever  can present with fever and a headache, NOT a rash;  and should be treated immediately with doxycycline.  I have sent this rx to your pharmacy to use if you develop these symptoms.    You can take your losartan amlodipine and metoprolol together in the morning  If BP readings are still > 140 next week,  Increase the amlodipine to 5 mg daily

## 2020-04-13 NOTE — Progress Notes (Signed)
Subjective:  Patient ID: Jane Davidson, female    DOB: October 07, 1941  Age: 79 y.o. MRN: [id number]  CC: The primary encounter diagnosis was Tick bite of abdomen, sequela. Diagnoses of Long term current use of anticoagulant therapy, Acquired hypothyroidism, Essential hypertension, Atrial fibrillation with rapid ventricular response ([place]), Acquired thrombophilia ([place]), and Type 2 diabetes mellitus with diabetic peripheral angiopathy without gangrene, without long-term current use of insulin ([place]) were also pertinent to this visit.  HPI Jane Davidson presents for hospital follow up  This visit occurred during the SARS-CoV-2 public health emergency.  Safety protocols were in place, including screening questions prior to the visit, additional usage of staff PPE, and extensive cleaning of exam room while observing appropriate contact time as indicated for disinfecting solutions.   Admitted June 26 to [facility] with atrial fibrillation with RVR rate 150 which presented  Wih palpitations .  Patient had been at home trying for several hours to remove an embedded tick on her  abdominal wall and became anxious when she could not.  She developed palpitations and presented to the ER for evaluation and treatment   During her brief hospitalization she ruled out for MI .  Metoprolol regimen was changed from XL to bid.    TSH was nearly suppressed. , levoT4  dose was reduced from 100 to 75 mcg .  ECHO was done,  No valvular issues  And normal EF. CHAD2DS2-VASC score 6: Eliquis started.  The tick bite was not addressed during hospitalization ,  And she was discharged on June 27 .    She saw her dermatologist who removed the tick head .  She has not covered the dime sized wound on her abdomen and notes persistent redness around the edges.    Outpatient Medications Prior to Visit  Medication Sig Dispense Refill  . ADVAIR DISKUS 100-50 MCG/DOSE AEPB INHALE 1 PUFF BY MOUTH TWICE DAILY 60 each 2  . amLODipine  (NORVASC) 2.5 MG tablet Take 1 tablet (2.5 mg total) by mouth daily. 90 tablet 3  . apixaban (ELIQUIS) 5 MG TABS tablet Take 1 tablet (5 mg total) by mouth 2 (two) times daily. 60 tablet 0  . Cholecalciferol (VITAMIN D3) 1000 UNITS CAPS Take 1 capsule by mouth daily.    . fluticasone (FLONASE) 50 MCG/ACT nasal spray SHAKE LIQUID AND USE 2 SPRAYS IN EACH NOSTRIL DAILY 16 g 2  . levothyroxine (SYNTHROID) 75 MCG tablet Take 1 tablet (75 mcg total) by mouth daily. 30 tablet 0  . loratadine (CLARITIN) 10 MG tablet Take 1 tablet (10 mg total) by mouth daily. 30 tablet 11  . losartan (COZAAR) 100 MG tablet TAKE 1 TABLET(100 MG) BY MOUTH DAILY 90 tablet 1  . metoprolol tartrate (LOPRESSOR) 25 MG tablet Take 1 tablet (25 mg total) by mouth 2 (two) times daily. 60 tablet 0  . nitroGLYCERIN (NITROSTAT) 0.4 MG SL tablet Place 1 tablet (0.4 mg total) under the tongue every 5 (five) minutes as needed for chest pain. 50 tablet 3  . sertraline (ZOLOFT) 50 MG tablet Take 1 tablet (50 mg total) by mouth daily. 90 tablet 1  . simvastatin (ZOCOR) 20 MG tablet Take 1 tablet (20 mg total) by mouth daily. 90 tablet 1  . traMADol (ULTRAM) 50 MG tablet TAKE 1 TABLET(50 MG) BY MOUTH EVERY 6 HOURS AS NEEDED 120 tablet 0  . augmented betamethasone dipropionate (DIPROLENE-AF) 0.05 % cream  (Patient not taking: Reported on 04/07/2020)  1  . clobetasol ointment (TEMOVATE)  0.73 % Apply 1 application topically 2 (two) times daily. (Patient not taking: Reported on 04/07/2020) 30 g 5  . triamcinolone cream (KENALOG) 0.1 % Apply 1 application topically 2 (two) times daily. (Patient not taking: Reported on 04/07/2020) 30 g 0   No facility-administered medications prior to visit.    Review of Systems;  Patient denies headache, fevers, malaise, unintentional weight loss, skin rash, eye pain, sinus congestion and sinus pain, sore throat, dysphagia,  hemoptysis , cough, dyspnea, wheezing, chest pain, palpitations, orthopnea, edema,  abdominal pain, nausea, melena, diarrhea, constipation, flank pain, dysuria, hematuria, urinary  Frequency, nocturia, numbness, tingling, seizures,  Focal weakness, Loss of consciousness,  Tremor, insomnia, depression, anxiety, and suicidal ideation.      Objective:  BP (!) 156/60 (BP Location: Left Arm, Patient Position: Sitting, Cuff Size: Normal)   Pulse 76   Temp 98.2 F (36.8 C) (Temporal)   Resp 15   Ht 5\' 4"  (1.626 m)   Wt 144 lb 3.2 oz (65.4 kg)   SpO2 98%   BMI 24.75 kg/m   BP Readings from Last 3 Encounters:  04/13/20 (!) 156/60  04/08/20 (!) 152/75  01/10/20 122/64    Wt Readings from Last 3 Encounters:  04/13/20 144 lb 3.2 oz (65.4 kg)  04/08/20 141 lb (64 kg)  01/10/20 145 lb 12.8 oz (66.1 kg)    General appearance: alert, cooperative and appears stated age Ears: normal TM's and external ear canals both ears Throat: lips, mucosa, and tongue normal; teeth and gums normal Neck: no adenopathy, no carotid bruit, supple, symmetrical, trachea midline and thyroid not enlarged, symmetric, no tenderness/mass/nodules Back: symmetric, no curvature. ROM normal. No CVA tenderness. Lungs: clear to auscultation bilaterally Heart: regular rate and rhythm, S1, S2 normal, no murmur, click, rub or gallop Abdomen: soft, non-tender; bowel sounds normal; no masses,  no organomegaly Pulses: 2+ and symmetric Skin: Skin color, texture, turgor normal. No rashes or lesions Lymph nodes: Cervical, supraclavicular, and axillary nodes normal.  Lab Results  Component Value Date   HGBA1C 5.8 (H) 04/07/2020   HGBA1C 6.0 11/09/2019   HGBA1C 5.4 05/26/2019    Lab Results  Component Value Date   CREATININE 0.83 04/08/2020   CREATININE 0.77 04/07/2020   CREATININE 0.82 11/09/2019    Lab Results  Component Value Date   WBC 8.3 04/08/2020   HGB 12.3 04/08/2020   HCT 34.8 (L) 04/08/2020   PLT 226 04/08/2020   GLUCOSE 99 04/08/2020   CHOL 144 11/09/2019   TRIG 73.0 11/09/2019   HDL  58.50 11/09/2019   LDLDIRECT 69.0 05/07/2017   LDLCALC 71 11/09/2019   ALT 12 11/09/2019   AST 18 11/09/2019   NA 136 04/08/2020   K 4.0 04/08/2020   CL 105 04/08/2020   CREATININE 0.83 04/08/2020   BUN 16 04/08/2020   CO2 25 04/08/2020   TSH 0.189 (L) 04/07/2020   INR 1.0 04/08/2020   HGBA1C 5.8 (H) 04/07/2020   MICROALBUR 1.9 11/09/2019    ECHOCARDIOGRAM COMPLETE  Result Date: 04/08/2020    ECHOCARDIOGRAM REPORT   Patient Name:   Jane Davidson Date of Exam: 04/08/2020 Medical Rec #:  [id number]        Height:       64.0 in Accession #:    [id number]       Weight:       141.0 lb Date of Birth:  [birth year]       BSA:  1.686 m Patient Age:    77 years         BP:           108/63 mmHg Patient Gender: F                HR:           67 bpm. Exam Location:  [facility] Procedure: 2D Echo Indications:     Atrial Fibrillation 427.31/ I48.91  History:         Patient has no prior history of Echocardiogram examinations.  Sonographer:     Arville Go RDCS Referring Phys:  [id number] Cleaster Corin PATEL Diagnosing Phys: Mertie Moores MD IMPRESSIONS  1. Left ventricular ejection fraction, by estimation, is 65 to 70%. The left ventricle has normal function. The left ventricle has no regional wall motion abnormalities. There is moderate left ventricular hypertrophy. Left ventricular diastolic parameters were normal.  2. Right ventricular systolic function is normal. The right ventricular size is normal.  3. The mitral valve is grossly normal. No evidence of mitral valve regurgitation. No evidence of mitral stenosis.  4. The aortic valve is tricuspid. Aortic valve regurgitation is not visualized. Mild aortic valve sclerosis is present, with no evidence of aortic valve stenosis. FINDINGS  Left Ventricle: Left ventricular ejection fraction, by estimation, is 65 to 70%. The left ventricle has normal function. The left ventricle has no regional wall motion abnormalities. The left ventricular internal cavity size was  normal in size. There is  moderate left ventricular hypertrophy. Left ventricular diastolic parameters were normal. Right Ventricle: The right ventricular size is normal. No increase in right ventricular wall thickness. Right ventricular systolic function is normal. Left Atrium: Left atrial size was normal in size. Right Atrium: Right atrial size was normal in size. Pericardium: There is no evidence of pericardial effusion. Mitral Valve: The mitral valve is grossly normal. No evidence of mitral valve regurgitation. No evidence of mitral valve stenosis. Tricuspid Valve: The tricuspid valve is grossly normal. Tricuspid valve regurgitation is mild . No evidence of tricuspid stenosis. Aortic Valve: The aortic valve is tricuspid. Aortic valve regurgitation is not visualized. Mild aortic valve sclerosis is present, with no evidence of aortic valve stenosis. There is mild calcification of the aortic valve. Aortic valve peak gradient measures 7.8 mmHg. Pulmonic Valve: The pulmonic valve was grossly normal. Pulmonic valve regurgitation is not visualized. Aorta: The aortic root is normal in size and structure. IAS/Shunts: The atrial septum is grossly normal.  LEFT VENTRICLE PLAX 2D LVIDd:         3.75 cm  Diastology LVIDs:         2.61 cm  LV e' lateral:   8.27 cm/s LV PW:         1.36 cm  LV E/e' lateral: 10.4 LV IVS:        1.30 cm  LV e' medial:    7.83 cm/s LVOT diam:     2.00 cm  LV E/e' medial:  11.0 LV SV:         66 LV SV Index:   39 LVOT Area:     3.14 cm  RIGHT VENTRICLE RV Basal diam:  2.63 cm RV S prime:     14.80 cm/s TAPSE (M-mode): 2.9 cm LEFT ATRIUM             Index       RIGHT ATRIUM           Index LA diam:  4.00 cm 2.37 cm/m  RA Area:     12.30 cm LA Vol (A2C):   25.0 ml 14.83 ml/m RA Volume:   29.80 ml  17.67 ml/m LA Vol (A4C):   35.8 ml 21.23 ml/m LA Biplane Vol: 31.4 ml 18.62 ml/m  AORTIC VALVE                PULMONIC VALVE AV Area (Vmax): 1.84 cm    PV Vmax:       1.02 m/s AV Vmax:         139.50 cm/s PV Peak grad:  4.2 mmHg AV Peak Grad:   7.8 mmHg LVOT Vmax:      81.60 cm/s LVOT Vmean:     56.400 cm/s LVOT VTI:       0.209 m  AORTA Ao Root diam: 2.80 cm Ao Asc diam:  2.90 cm MITRAL VALVE               TRICUSPID VALVE MV Area (PHT): 2.99 cm    TV Peak grad:   26.7 mmHg MV Decel Time: 254 msec    TV Vmax:        2.58 m/s MV E velocity: 86.10 cm/s MV A velocity: 83.10 cm/s  SHUNTS MV E/A ratio:  1.04        Systemic VTI:  0.21 m                            Systemic Diam: 2.00 cm Mertie Moores MD Electronically signed by Mertie Moores MD Signature Date/Time: 04/08/2020/3:07:06 PM    Final     Assessment & Plan:   Problem List Items Addressed This Visit      Unprioritized   Hypothyroidism   Relevant Orders   TSH   Tick bite of abdomen - Primary    She has persistent erythema extending 3 mm beyond the edge of the surgical wound.  The wound bed is covered in slough.  Keflex qid for   7 days . Patient given rx for doxycycline to use for next tick bite if symptoms of fever and headache develop.       Relevant Orders   Lyme Ab (IgG/M) + RMSF( IgG/M)   Essential hypertension    Elevated on losartan 100 m, metoprolol 25 m g bid and amlodipine 2.5 mg daily Patient advised to continue daily checks at hoe and increase amlodipine dose to 5 mg if  hersystolic remains > 625      Relevant Orders   Comprehensive metabolic panel   Type 2 diabetes mellitus with diabetic peripheral angiopathy without gangrene, without long-term current use of insulin ([facility])     Remains well-controlled on diet alone.  hemoglobin A1c has been consistently at or  less than 7.0 checked every 6 months  . Patient is up-to-date on eye exams and foot exam has been  normal  Patient has no proteinuria and is tolerating statin therapy for CAD risk reduction.  BP has been elevated on the last two visits,  Given her ongoing tobacco abuse, will recommend continued attempts at smoking cessation,  Continued use of daily aspirin  81  mg and  Adherence to regimen of metoprolol and losartan .  Lab Results  Component Value Date   HGBA1C 5.8 (H) 04/07/2020   Lab Results  Component Value Date   MICROALBUR 1.9 11/09/2019         Atrial fibrillation with rapid ventricular response ([place])  Currently rate controlled on metoprolol 25 mg bid. Thyroid was hyperactive and dose of LT4 was reduced I n house . Continue Eliquis       Acquired thrombophilia ([place])    secondary to use of Eliquis for embolic stroke risk mitigation due to  atrial fibrillation. Patient has no signs of bleeding and is advised to notify her specialists prior to any procedure that may required suspension of Eliquis        Other Visit Diagnoses    Long term current use of anticoagulant therapy       Relevant Orders   CBC with Differential/Platelet      I am having Jane Davidson start on cephALEXin and doxycycline. I am also having her maintain her Vitamin D3, clobetasol ointment, sertraline, loratadine, augmented betamethasone dipropionate, nitroGLYCERIN, amLODipine, triamcinolone cream, Advair Diskus, traMADol, fluticasone, simvastatin, losartan, levothyroxine, apixaban, and metoprolol tartrate.  Meds ordered this encounter  Medications  . cephALEXin (KEFLEX) 500 MG capsule    Sig: Take 1 capsule (500 mg total) by mouth 4 (four) times daily.    Dispense:  28 capsule    Refill:  0  . doxycycline (VIBRA-TABS) 100 MG tablet    Sig: Take 1 tablet (100 mg total) by mouth 2 (two) times daily.    Dispense:  20 tablet    Refill:  0    There are no discontinued medications.  Follow-up: No follow-ups on file.   Crecencio Mc, MD

## 2020-04-15 DIAGNOSIS — Z09 Encounter for follow-up examination after completed treatment for conditions other than malignant neoplasm: Secondary | ICD-10-CM | POA: Insufficient documentation

## 2020-04-15 DIAGNOSIS — D6869 Other thrombophilia: Secondary | ICD-10-CM | POA: Insufficient documentation

## 2020-04-15 NOTE — Assessment & Plan Note (Signed)
She has persistent erythema extending 3 mm beyond the edge of the surgical wound.  The wound bed is covered in slough.  Keflex qid for   7 days . Patient given rx for doxycycline to use for next tick bite if symptoms of fever and headache develop.

## 2020-04-15 NOTE — Assessment & Plan Note (Signed)
secondary to use of Eliquis for embolic stroke risk mitigation due to  atrial fibrillation. Patient has no signs of bleeding and is advised to notify her specialists prior to any procedure that may required suspension of Eliquis

## 2020-04-15 NOTE — Assessment & Plan Note (Signed)
Elevated on losartan 100 m, metoprolol 25 m g bid and amlodipine 2.5 mg daily Patient advised to continue daily checks at hoe and increase amlodipine dose to 5 mg if  hersystolic remains > 161

## 2020-04-15 NOTE — Assessment & Plan Note (Signed)
Patient is stable post discharge and has no new issues or questions about discharge plans at the visit today for hospital follow up. All labs , imaging studies and progress notes from admission were reviewed with patient today   

## 2020-04-15 NOTE — Assessment & Plan Note (Signed)
Currently rate controlled on metoprolol 25 mg bid. Thyroid was hyperactive and dose of LT4 was reduced I n house . Continue Eliquis

## 2020-04-15 NOTE — Assessment & Plan Note (Signed)
Remains well-controlled on diet alone.  hemoglobin A1c has been consistently at or  less than 7.0 checked every 6 months  . Patient is up-to-date on eye exams and foot exam has been  normal  Patient has no proteinuria and is tolerating statin therapy for CAD risk reduction.  BP has been elevated on the last two visits,  Given her ongoing tobacco abuse, will recommend continued attempts at smoking cessation,  Continued use of daily aspirin  81 mg and  Adherence to regimen of metoprolol and losartan .  Lab Results  Component Value Date   HGBA1C 5.8 (H) 04/07/2020   Lab Results  Component Value Date   MICROALBUR 1.9 11/09/2019

## 2020-04-24 ENCOUNTER — Other Ambulatory Visit: Payer: Self-pay | Admitting: Internal Medicine

## 2020-04-24 DIAGNOSIS — I4891 Unspecified atrial fibrillation: Secondary | ICD-10-CM | POA: Diagnosis not present

## 2020-04-30 ENCOUNTER — Ambulatory Visit: Payer: PPO | Admitting: Cardiology

## 2020-05-07 ENCOUNTER — Telehealth: Payer: Self-pay | Admitting: Internal Medicine

## 2020-05-07 MED ORDER — LEVOTHYROXINE SODIUM 75 MCG PO TABS: 75.0000 ug | ORAL_TABLET | Freq: Every day | ORAL | 0 refills | Status: DC

## 2020-05-07 NOTE — Telephone Encounter (Signed)
Pt needs  Refill on levothyroxine (SYNTHROID) 75 MCG tablet sent to [facility]

## 2020-05-09 DIAGNOSIS — E1151 Type 2 diabetes mellitus with diabetic peripheral angiopathy without gangrene: Secondary | ICD-10-CM | POA: Diagnosis not present

## 2020-05-09 DIAGNOSIS — I6529 Occlusion and stenosis of unspecified carotid artery: Secondary | ICD-10-CM | POA: Diagnosis not present

## 2020-05-09 DIAGNOSIS — I10 Essential (primary) hypertension: Secondary | ICD-10-CM | POA: Diagnosis not present

## 2020-05-09 DIAGNOSIS — E785 Hyperlipidemia, unspecified: Secondary | ICD-10-CM | POA: Diagnosis not present

## 2020-05-09 DIAGNOSIS — I4891 Unspecified atrial fibrillation: Secondary | ICD-10-CM | POA: Diagnosis not present

## 2020-05-09 DIAGNOSIS — I83812 Varicose veins of left lower extremities with pain: Secondary | ICD-10-CM | POA: Diagnosis not present

## 2020-05-24 ENCOUNTER — Other Ambulatory Visit: Payer: Self-pay | Admitting: Internal Medicine

## 2020-05-25 ENCOUNTER — Other Ambulatory Visit: Payer: Self-pay

## 2020-05-25 ENCOUNTER — Other Ambulatory Visit (INDEPENDENT_AMBULATORY_CARE_PROVIDER_SITE_OTHER): Payer: PPO

## 2020-05-25 DIAGNOSIS — S30861S Insect bite (nonvenomous) of abdominal wall, sequela: Secondary | ICD-10-CM

## 2020-05-25 DIAGNOSIS — E119 Type 2 diabetes mellitus without complications: Secondary | ICD-10-CM

## 2020-05-25 DIAGNOSIS — E034 Atrophy of thyroid (acquired): Secondary | ICD-10-CM

## 2020-05-25 DIAGNOSIS — I10 Essential (primary) hypertension: Secondary | ICD-10-CM

## 2020-05-25 DIAGNOSIS — Z7901 Long term (current) use of anticoagulants: Secondary | ICD-10-CM

## 2020-05-25 DIAGNOSIS — E7800 Pure hypercholesterolemia, unspecified: Secondary | ICD-10-CM | POA: Diagnosis not present

## 2020-05-25 DIAGNOSIS — E039 Hypothyroidism, unspecified: Secondary | ICD-10-CM

## 2020-05-25 DIAGNOSIS — W57XXXS Bitten or stung by nonvenomous insect and other nonvenomous arthropods, sequela: Secondary | ICD-10-CM | POA: Diagnosis not present

## 2020-05-25 DIAGNOSIS — D649 Anemia, unspecified: Secondary | ICD-10-CM

## 2020-05-25 LAB — COMPREHENSIVE METABOLIC PANEL
ALT: 9 U/L (ref 0–35)
AST: 16 U/L (ref 0–37)
Albumin: 4 g/dL (ref 3.5–5.2)
Alkaline Phosphatase: 63 U/L (ref 39–117)
BUN: 14 mg/dL (ref 6–23)
CO2: 27 mEq/L (ref 19–32)
Calcium: 9 mg/dL (ref 8.4–10.5)
Chloride: 96 mEq/L (ref 96–112)
Creatinine, Ser: 0.89 mg/dL (ref 0.40–1.20)
GFR: 61.4 mL/min (ref >60.00)
Glucose, Bld: 96 mg/dL (ref 70–99)
Potassium: 4.2 mEq/L (ref 3.5–5.1)
Sodium: 130 mEq/L — ABNORMAL LOW (ref 135–145)
Total Bilirubin: 0.4 mg/dL (ref 0.2–1.2)
Total Protein: 6.6 g/dL (ref 6.0–8.3)

## 2020-05-25 LAB — LIPID PANEL
Cholesterol: 147 mg/dL (ref 0–200)
HDL: 51.6 mg/dL (ref >39.00)
LDL Cholesterol: 68 mg/dL (ref 0–99)
NonHDL: 95.1
Total CHOL/HDL Ratio: 3
Triglycerides: 136 mg/dL (ref 0.0–149.0)
VLDL: 27.2 mg/dL (ref 0.0–40.0)

## 2020-05-25 LAB — CBC WITH DIFFERENTIAL/PLATELET
Basophils Absolute: 0.1 10*3/uL (ref 0.0–0.1)
Basophils Relative: 0.9 % (ref 0.0–3.0)
Eosinophils Absolute: 0.1 10*3/uL (ref 0.0–0.7)
Eosinophils Relative: 1 % (ref 0.0–5.0)
HCT: 35.9 % — ABNORMAL LOW (ref 36.0–46.0)
Hemoglobin: 12.2 g/dL (ref 12.0–15.0)
Lymphocytes Relative: 13.7 % (ref 12.0–46.0)
Lymphs Abs: 1 10*3/uL (ref 0.7–4.0)
MCHC: 34 g/dL (ref 30.0–36.0)
MCV: 91.2 fl (ref 78.0–100.0)
Monocytes Absolute: 0.7 10*3/uL (ref 0.1–1.0)
Monocytes Relative: 8.7 % (ref 3.0–12.0)
Neutro Abs: 5.8 10*3/uL (ref 1.4–7.7)
Neutrophils Relative %: 75.7 % (ref 43.0–77.0)
Platelets: 248 10*3/uL (ref 150.0–400.0)
RBC: 3.93 Mil/uL (ref 3.87–5.11)
RDW: 13.9 % (ref 11.5–15.5)
WBC: 7.6 10*3/uL (ref 4.0–10.5)

## 2020-05-25 LAB — HEMOGLOBIN A1C: Hgb A1c MFr Bld: 5.9 % (ref 4.6–6.5)

## 2020-05-25 LAB — TSH: TSH: 6.65 u[IU]/mL — ABNORMAL HIGH (ref 0.35–4.50)

## 2020-05-28 ENCOUNTER — Other Ambulatory Visit: Payer: Self-pay | Admitting: Internal Medicine

## 2020-05-28 DIAGNOSIS — E871 Hypo-osmolality and hyponatremia: Secondary | ICD-10-CM

## 2020-05-28 DIAGNOSIS — E038 Other specified hypothyroidism: Secondary | ICD-10-CM

## 2020-05-28 MED ORDER — LEVOTHYROXINE SODIUM 88 MCG PO TABS
88.0000 ug | ORAL_TABLET | Freq: Every day | ORAL | 3 refills | Status: DC
Start: 2020-05-28 — End: 2021-06-25

## 2020-05-28 NOTE — Addendum Note (Signed)
Addended by: Crecencio Mc on: 05/28/2020 05:41 PM   Modules accepted: Orders

## 2020-05-28 NOTE — Assessment & Plan Note (Signed)
Your thyroid is underactive on your current levothyroxine dose; since you just had it refilled,  I want you to continue your current dose of 75 mcg daily , but take 2 tablets every Sunday instead of 1 When you finish your current bottle,  The next refill will be for the 88 mcg dose that you will take once daily

## 2020-06-09 LAB — LYMEAB(IGG/M)+RMSF(IGG/M)
LYME DISEASE AB, QUANT, IGM: <0.8 index (ref 0.00–0.79)
Lyme IgG/IgM Ab: <0.91 {ISR} (ref 0.00–0.90)
RMSF IgG: POSITIVE — AB
RMSF IgM: 0.36 index (ref 0.00–0.89)

## 2020-06-09 LAB — RMSF, IGG, IFA: RMSF, IGG, IFA: <1:64 {titer}

## 2020-06-22 DIAGNOSIS — F333 Major depressive disorder, recurrent, severe with psychotic symptoms: Secondary | ICD-10-CM | POA: Diagnosis not present

## 2020-06-23 ENCOUNTER — Other Ambulatory Visit: Payer: Self-pay | Admitting: Internal Medicine

## 2020-06-25 ENCOUNTER — Other Ambulatory Visit: Payer: Self-pay | Admitting: Internal Medicine

## 2020-07-12 ENCOUNTER — Other Ambulatory Visit (INDEPENDENT_AMBULATORY_CARE_PROVIDER_SITE_OTHER): Payer: PPO

## 2020-07-12 ENCOUNTER — Other Ambulatory Visit: Payer: Self-pay

## 2020-07-12 DIAGNOSIS — E871 Hypo-osmolality and hyponatremia: Secondary | ICD-10-CM

## 2020-07-12 DIAGNOSIS — E038 Other specified hypothyroidism: Secondary | ICD-10-CM

## 2020-07-12 LAB — BASIC METABOLIC PANEL
BUN: 12 mg/dL (ref 6–23)
CO2: 26 mEq/L (ref 19–32)
Calcium: 9 mg/dL (ref 8.4–10.5)
Chloride: 99 mEq/L (ref 96–112)
Creatinine, Ser: 0.81 mg/dL (ref 0.40–1.20)
GFR: 68.43 mL/min (ref >60.00)
Glucose, Bld: 85 mg/dL (ref 70–99)
Potassium: 4.5 mEq/L (ref 3.5–5.1)
Sodium: 131 mEq/L — ABNORMAL LOW (ref 135–145)

## 2020-07-12 LAB — TSH: TSH: 3.16 u[IU]/mL (ref 0.35–4.50)

## 2020-07-30 ENCOUNTER — Telehealth: Payer: Self-pay

## 2020-07-30 NOTE — Telephone Encounter (Signed)
Pt needs refills on levothyroxine (SYNTHROID) 88 MCG tablet  And amLODipine (NORVASC) 2.5 MG tablet

## 2020-07-30 NOTE — Telephone Encounter (Signed)
LMTCB. Need to let pt know that she should have refills at the pharmacy and all she needs to do is call them to refill them.

## 2020-08-13 DIAGNOSIS — I4891 Unspecified atrial fibrillation: Secondary | ICD-10-CM | POA: Diagnosis not present

## 2020-08-13 DIAGNOSIS — I6529 Occlusion and stenosis of unspecified carotid artery: Secondary | ICD-10-CM | POA: Diagnosis not present

## 2020-08-13 DIAGNOSIS — E1151 Type 2 diabetes mellitus with diabetic peripheral angiopathy without gangrene: Secondary | ICD-10-CM | POA: Diagnosis not present

## 2020-08-13 DIAGNOSIS — I83812 Varicose veins of left lower extremities with pain: Secondary | ICD-10-CM | POA: Diagnosis not present

## 2020-08-13 DIAGNOSIS — I10 Essential (primary) hypertension: Secondary | ICD-10-CM | POA: Diagnosis not present

## 2020-08-13 DIAGNOSIS — Z720 Tobacco use: Secondary | ICD-10-CM | POA: Diagnosis not present

## 2020-08-13 DIAGNOSIS — E785 Hyperlipidemia, unspecified: Secondary | ICD-10-CM | POA: Diagnosis not present

## 2020-08-16 ENCOUNTER — Telehealth: Payer: Self-pay | Admitting: Internal Medicine

## 2020-08-16 NOTE — Telephone Encounter (Signed)
Pt would like a call back about metoprolol succinate (TOPROL-XL) 25 MG XL tablet that Dr. Ubaldo Glassing put her on  Pt BP is running 182/81   She will call Dr. Ubaldo Glassing office

## 2020-08-17 NOTE — Telephone Encounter (Signed)
Pt called returning your call 

## 2020-08-17 NOTE — Telephone Encounter (Signed)
LMTCB

## 2020-08-17 NOTE — Telephone Encounter (Signed)
Pt returned your call.  

## 2020-08-17 NOTE — Telephone Encounter (Signed)
Spoke with pt and she is confused about which metoprolol she is supposed to be taking. Pt has been taking the metoprolol tartrate 25 mg BID but Dr. Bethanne Ginger office called in Metoprolol Succinate XL 25 mg once daily. Pt is hesitant to take the XL so pt was advised to continue taking the tartrate and call Dr. Bethanne Ginger office for clarification on Monday.

## 2020-08-17 NOTE — Telephone Encounter (Signed)
Pt called back wanting a return call

## 2020-09-18 ENCOUNTER — Ambulatory Visit (INDEPENDENT_AMBULATORY_CARE_PROVIDER_SITE_OTHER): Payer: PPO

## 2020-09-18 ENCOUNTER — Ambulatory Visit (INDEPENDENT_AMBULATORY_CARE_PROVIDER_SITE_OTHER): Payer: PPO | Admitting: Vascular Surgery

## 2020-09-18 ENCOUNTER — Other Ambulatory Visit: Payer: Self-pay

## 2020-09-18 DIAGNOSIS — I6523 Occlusion and stenosis of bilateral carotid arteries: Secondary | ICD-10-CM | POA: Diagnosis not present

## 2020-09-20 ENCOUNTER — Other Ambulatory Visit (INDEPENDENT_AMBULATORY_CARE_PROVIDER_SITE_OTHER): Payer: Self-pay | Admitting: Vascular Surgery

## 2020-09-20 ENCOUNTER — Encounter (INDEPENDENT_AMBULATORY_CARE_PROVIDER_SITE_OTHER): Payer: Self-pay | Admitting: *Deleted

## 2020-09-20 DIAGNOSIS — I6523 Occlusion and stenosis of bilateral carotid arteries: Secondary | ICD-10-CM

## 2020-09-21 ENCOUNTER — Other Ambulatory Visit: Payer: Self-pay | Admitting: Internal Medicine

## 2020-09-27 DIAGNOSIS — Z7984 Long term (current) use of oral hypoglycemic drugs: Secondary | ICD-10-CM | POA: Diagnosis not present

## 2020-09-27 DIAGNOSIS — Z961 Presence of intraocular lens: Secondary | ICD-10-CM | POA: Diagnosis not present

## 2020-09-27 DIAGNOSIS — E119 Type 2 diabetes mellitus without complications: Secondary | ICD-10-CM | POA: Diagnosis not present

## 2020-09-27 DIAGNOSIS — H524 Presbyopia: Secondary | ICD-10-CM | POA: Diagnosis not present

## 2020-09-27 DIAGNOSIS — H52223 Regular astigmatism, bilateral: Secondary | ICD-10-CM | POA: Diagnosis not present

## 2020-09-27 DIAGNOSIS — H5203 Hypermetropia, bilateral: Secondary | ICD-10-CM | POA: Diagnosis not present

## 2020-09-27 DIAGNOSIS — H43813 Vitreous degeneration, bilateral: Secondary | ICD-10-CM | POA: Diagnosis not present

## 2020-09-27 LAB — HM DIABETES EYE EXAM

## 2020-10-01 ENCOUNTER — Telehealth: Payer: Self-pay | Admitting: Internal Medicine

## 2020-10-01 MED ORDER — AMLODIPINE BESYLATE 2.5 MG PO TABS: ORAL_TABLET | ORAL | 3 refills | Status: DC

## 2020-10-01 NOTE — Addendum Note (Signed)
Addended by: Elpidio Galea T on: 10/01/2020 09:49 AM   Modules accepted: Orders

## 2020-10-01 NOTE — Telephone Encounter (Signed)
Pt needs a refill on amLODipine (NORVASC) 2.5 MG tablet sent to [facility]

## 2020-10-15 ENCOUNTER — Ambulatory Visit (INDEPENDENT_AMBULATORY_CARE_PROVIDER_SITE_OTHER): Payer: PPO

## 2020-10-15 VITALS — Ht 64.0 in | Wt 144.0 lb

## 2020-10-15 DIAGNOSIS — Z0000 Encounter for general adult medical examination without abnormal findings: Secondary | ICD-10-CM | POA: Diagnosis not present

## 2020-10-15 NOTE — Progress Notes (Addendum)
Subjective:   Jane Davidson is a 80 y.o. female who presents for Medicare Annual (Subsequent) preventive examination.  Review of Systems    No ROS.  Medicare Wellness Virtual Visit.  Cardiac Risk Factors include: advanced age (>60men, >19 women);hypertension;diabetes mellitus     Objective:    Today's Vitals   10/15/20 0941  Weight: 144 lb (65.3 kg)  Height: 5\' 4"  (1.626 m)   Body mass index is 24.72 kg/m.  Advanced Directives 10/15/2020 04/07/2020 04/07/2020 10/12/2019 10/07/2018 10/07/2017 07/07/2017  Does Patient Have a Medical Advance Directive? Yes Yes Yes Yes Yes Yes Yes  Type of 07/09/2017 - Healthcare Power of Advertising copywriter Power of [place];Living will Healthcare Power of [place];Living will Living will;Healthcare Power of Attorney  Does patient want to make changes to medical advance directive? No - Patient declined No - Patient declined - No - Patient declined No - Patient declined - -  Copy of Healthcare Power of Attorney in Chart? No - copy requested No - copy requested - No - copy requested No - copy requested No - copy requested -    Current Medications (verified) Outpatient Encounter Medications as of 10/15/2020  Medication Sig   ADVAIR DISKUS 100-50 MCG/DOSE AEPB INHALE 1 PUFF BY MOUTH TWICE DAILY   amLODipine (NORVASC) 2.5 MG tablet TAKE 1 TABLET(2.5 MG) BY MOUTH DAILY   apixaban (ELIQUIS) 5 MG TABS tablet Take 1 tablet (5 mg total) by mouth 2 (two) times daily.   augmented betamethasone dipropionate (DIPROLENE-AF) 0.05 % cream  (Patient not taking: Reported on 04/07/2020)   Cholecalciferol (VITAMIN D3) 1000 UNITS CAPS Take 1 capsule by mouth daily.   clobetasol ointment (TEMOVATE) 0.05 % Apply 1 application topically 2 (two) times daily. (Patient not taking: Reported on 04/07/2020)   fluticasone (FLONASE) 50 MCG/ACT nasal spray SHAKE LIQUID AND USE 2 SPRAYS IN EACH NOSTRIL DAILY   levothyroxine  (SYNTHROID) 88 MCG tablet Take 1 tablet (88 mcg total) by mouth daily.   loratadine (CLARITIN) 10 MG tablet Take 1 tablet (10 mg total) by mouth daily.   losartan (COZAAR) 100 MG tablet TAKE 1 TABLET(100 MG) BY MOUTH DAILY   metoprolol tartrate (LOPRESSOR) 25 MG tablet Take 1 tablet (25 mg total) by mouth 2 (two) times daily.   nitroGLYCERIN (NITROSTAT) 0.4 MG SL tablet Place 1 tablet (0.4 mg total) under the tongue every 5 (five) minutes as needed for chest pain.   sertraline (ZOLOFT) 50 MG tablet Take 1 tablet (50 mg total) by mouth daily.   simvastatin (ZOCOR) 20 MG tablet TAKE 1 TABLET(20 MG) BY MOUTH DAILY   traMADol (ULTRAM) 50 MG tablet TAKE 1 TABLET(50 MG) BY MOUTH EVERY 6 HOURS AS NEEDED   triamcinolone cream (KENALOG) 0.1 % Apply 1 application topically 2 (two) times daily. (Patient not taking: Reported on 04/07/2020)   [DISCONTINUED] cephALEXin (KEFLEX) 500 MG capsule Take 1 capsule (500 mg total) by mouth 4 (four) times daily.   [DISCONTINUED] doxycycline (VIBRA-TABS) 100 MG tablet Take 1 tablet (100 mg total) by mouth 2 (two) times daily.   No facility-administered encounter medications on file as of 10/15/2020.    Allergies (verified) Pollen extract, Hctz [hydrochlorothiazide], and Other   History: Past Medical History:  Diagnosis Date   Abnormal Pap smear of cervix 2001   Biopsy normal   Bursitis NEC    Cancer (HCC)    skin   COPD (chronic obstructive pulmonary disease) (HCC)    History  of peripheral vascular disease 2001   s/p bilateral aorto-iliac-femoral bypass /[facility]   Hyperlipidemia    hypothyroid    Osteopenia 2009    T scores - 1.5 DEXA 2009   Screening for breast cancer May 2012   [place] mammogram   Tobacco abuse disorder    Vitamin D deficiency April 2011   replaced withDrsido for level of 11.3 ng/ml   Vitamin D deficiency April 2011   replaced withDrsido for level of 11.3 ng/ml   Past Surgical History:  Procedure Laterality Date   BREAST BIOPSY Left     benign   CAROTID ENDARTERECTOMY Right 2016   Leotis Pain   CATARACT EXTRACTION  Sept 2011   CHOLECYSTECTOMY     HERNIA REPAIR  Jun 2011   [facility]    TUBAL LIGATION     Family History  Problem Relation Age of Onset   Hypertension Mother    Arthritis Mother    Diabetes Father    Dementia Sister    Breast cancer Neg Hx    Social History   Socioeconomic History   Marital status: Widowed    Spouse name: Not on file   Number of children: Not on file   Years of education: Not on file   Highest education level: Not on file  Occupational History   Not on file  Tobacco Use   Smoking status: Current Every Day Smoker    Packs/day: 1.04    Years: 40.00    Pack years: 41.60    Types: Cigarettes   Smokeless tobacco: Never Used  Substance and Sexual Activity   Alcohol use: No   Drug use: No   Sexual activity: Never  Other Topics Concern   Not on file  Social History Narrative   Not on file   Social Determinants of Health   Financial Resource Strain: Low Risk    Difficulty of Paying Living Expenses: Not hard at all  Food Insecurity: No Food Insecurity   Worried About Charity fundraiser in the Last Year: Never true   [place] in the Last Year: Never true  Transportation Needs: No Transportation Needs   Lack of Transportation (Medical): No   Lack of Transportation (Non-Medical): No  Physical Activity: Not on file  Stress: No Stress Concern Present   Feeling of Stress : Only a little  Social Connections: Unknown   Frequency of Communication with Friends and Family: More than three times a week   Frequency of Social Gatherings with Friends and Family: Once a week   Attends Religious Services: 1 to 4 times per year   Active Member of [employer] or Organizations: Yes   Attends Music therapist: Not on file   Marital Status: Not on file    Tobacco Counseling Ready to quit: Not Answered Counseling given: Not Answered   Clinical Intake:  Pre-visit  preparation completed: Yes        Diabetes: Yes (Followed by pcp) CBG done?: No Did pt. bring in CBG monitor from home?: No  How often do you need to have someone help you when you read instructions, pamphlets, or other written materials from your doctor or pharmacy?: 1 - Never  Nutrition Risk Assessment: Has the patient had any N/V/D within the last 2 months?  No  Does the patient have any non-healing wounds?  No  Has the patient had any unintentional weight loss or weight gain?  No   Diabetes: If diabetic, was a CBG  obtained today?  No  Did the patient bring in their glucometer from home?  No  How often do you monitor your CBG's? Does not check at home.   Financial Strains and Diabetes Management: Are you having any financial strains with the device, your supplies or your medication? No .  Does the patient want to be seen by Chronic Care Management for management of their diabetes?  No  Would the patient like to be referred to a Nutritionist or for Diabetic Management?  No    Diabetic Exams: Diabetic Eye Exam: Completed 08/27/20 Diabetic Foot Exam: Completed 01/10/20  Interpreter Needed?: No      Activities of Daily Living In your present state of health, do you have any difficulty performing the following activities: 10/15/2020 04/07/2020  Hearing? N N  Vision? N N  Difficulty concentrating or making decisions? N N  Walking or climbing stairs? N N  Dressing or bathing? N N  Doing errands, shopping? N Y  Conservation officer, nature and eating ? N -  Using the Toilet? N -  In the past six months, have you accidently leaked urine? N -  Do you have problems with loss of bowel control? N -  Managing your Medications? N -  Managing your Finances? N -  Housekeeping or managing your Housekeeping? N -  Some recent data might be hidden    Patient Care Team: Crecencio Mc, MD as PCP - General (Internal Medicine)  Indicate any recent Medical Services you may have received from other  than [facility] providers in the past year (date may be approximate).     Assessment:   This is a routine wellness examination for Jane Davidson.  I connected with Jane Davidson today by telephone and verified that I am speaking with the correct person using two identifiers. Location patient: home Location provider: work Persons participating in the virtual visit: patient, Marine scientist.    I discussed the limitations, risks, security and privacy concerns of performing an evaluation and management service by telephone and the availability of in person appointments. The patient expressed understanding and verbally consented to this telephonic visit.    Interactive audio and video telecommunications were attempted between this provider and patient, however failed, due to patient having technical difficulties OR patient did not have access to video capability.  We continued and completed visit with audio only.  Some vital signs may be absent or patient reported.   Dietary issues and exercise activities discussed: Current Exercise Habits: The patient does not participate in regular exercise at present  Healthy diet  Good water intake  Goals       Patient Stated     Quit Smoking (pt-stated)      Not ready to stop smoking today, but will try.  Increase physical activity       Depression Screen PHQ 2/9 Scores 10/15/2020 10/12/2019 10/07/2018 03/01/2018 10/07/2017 04/22/2017 10/03/2016  PHQ - 2 Score 0 1 0 0 0 0 0  PHQ- 9 Score - - - - 0 - -    Fall Risk Fall Risk  10/15/2020 04/13/2020 01/10/2020 10/12/2019 10/12/2019  Falls in the past year? 1 0 0 0 0  Comment - - - - -  Number falls in past yr: 0 - - - -  Injury with Fall? 0 - - - -  Follow up Falls evaluation completed Falls evaluation completed Falls evaluation completed Falls evaluation completed Falls prevention discussed    FALL RISK PREVENTION PERTAINING TO THE HOME: Handrails in used  when climbing stairs? Yes Home free of loose throw rugs in  walkways, pet beds, electrical cords, etc? Yes  Adequate lighting in your home to reduce risk of falls? Yes   ASSISTIVE DEVICES UTILIZED TO PREVENT FALLS: Life alert? No  Use of a cane, walker or w/c? No  Grab bars in the bathroom? Yes  Shower chair or bench in shower? Yes   Elevated toilet seat or a handicapped toilet? Yes   TIMED UP AND GO: Was the test performed? No . Virtual visit.   Cognitive Function: MMSE - Mini Mental State Exam 10/12/2019 10/09/2015  Not completed: Unable to complete -  Orientation to time - 5  Orientation to Place - 5  Registration - 3  Attention/ Calculation - 5  Recall - 3  Language- name 2 objects - 2  Language- repeat - 1  Language- follow 3 step command - 3  Language- read & follow direction - 1  Write a sentence - 1  Copy design - 1  Total score - 30     6CIT Screen 10/15/2020 10/07/2018 10/07/2017 10/03/2016  What Year? 0 points 0 points 0 points 0 points  What month? 0 points 0 points 0 points 0 points  What time? 0 points 0 points 0 points 0 points  Count back from 20 - 0 points 0 points 0 points  Months in reverse 0 points 0 points 0 points 0 points  Repeat phrase - 0 points 0 points -  Total Score - 0 0 -    Immunizations Immunization History  Administered Date(s) Administered   Fluad Quad(high Dose 65+) 06/17/2019   Influenza Split 07/05/2012, 07/27/2013, 06/29/2014   Influenza, High Dose Seasonal PF 06/10/2017, 06/15/2018   Influenza,inj,Quad PF,6+ Mos 06/04/2016   Influenza-Unspecified 07/20/2015   PFIZER SARS-COV-2 Vaccination 11/29/2019, 12/20/2019   Pneumococcal Conjugate-13 04/06/2014   Pneumococcal Polysaccharide-23 07/05/2012, 11/18/2017   Tdap 10/16/2011   Zoster 12/19/2011    Health Maintenance Health Maintenance  Topic Date Due   INFLUENZA VACCINE  10/17/2020 (Originally 05/13/2020)   COVID-19 Vaccine (3 - Booster for [place] series) 10/31/2020 (Originally 06/21/2020)   HEMOGLOBIN A1C  11/25/2020   FOOT EXAM   01/09/2021   MAMMOGRAM  02/05/2021   OPHTHALMOLOGY EXAM  08/27/2021   TETANUS/TDAP  10/15/2021   DEXA SCAN  Completed   Hepatitis C Screening  Completed   PNA vac Low Risk Adult  Completed   Colorectal cancer screening: No longer required.   Mammogram status: Completed 02/06/20. Repeat every year. MM 3D SCREEN BREAST BILATERAL   Bone Density status: Completed 10/15/16. Results reflect: Bone density results: OSTEOPENIA. Repeat every 2 years.  Hepatitis C Screening: Completed 02/18/16 @ 10:00.  Vision Screening: Recommended annual ophthalmology exams for early detection of glaucoma and other disorders of the eye. Is the patient up to date with their annual eye exam?  Yes  Who is the provider or what is the name of the office in which the patient attends annual eye exams? Dr. Matilde Sprang.  Dental Screening: Recommended annual dental exams for proper oral hygiene.  Community Resource Referral / Chronic Care Management: CRR required this visit?  No   CCM required this visit?  No      Plan:   Keep all routine maintenance appointments.   Follow up 10/17/20 @ 10:30.  I have personally reviewed and noted the following in the patient's chart:   Medical and social history Use of alcohol, tobacco or illicit drugs  Current medications and supplements Functional ability  and status Nutritional status Physical activity Advanced directives List of other physicians Hospitalizations, surgeries, and ER visits in previous 12 months Vitals Screenings to include cognitive, depression, and falls Referrals and appointments  In addition, I have reviewed and discussed with patient certain preventive protocols, quality metrics, and best practice recommendations. A written personalized care plan for preventive services as well as general preventive health recommendations were provided to patient via mychart.  OBrien-Blaney, Jasiah Buntin L, LPN   [id number]     I have reviewed the above information and agree with  above.   [name], MD

## 2020-10-15 NOTE — Patient Instructions (Addendum)
Jane Davidson , Thank you for taking time to come for your Medicare Wellness Visit. I appreciate your ongoing commitment to your health goals. Please review the following plan we discussed and let me know if I can assist you in the future.   These are the goals we discussed: Goals      Patient Stated   .  Quit Smoking (pt-stated)      Not ready to stop smoking today, but will try.  Increase physical activity       This is a list of the screening recommended for you and due dates:  Health Maintenance  Topic Date Due  . Flu Shot  10/17/2020*  . COVID-19 Vaccine (3 - Booster for Pfizer series) 10/31/2020*  . Hemoglobin A1C  11/25/2020  . Complete foot exam   01/09/2021  . Mammogram  02/05/2021  . Eye exam for diabetics  08/27/2021  . Tetanus Vaccine  10/15/2021  . DEXA scan (bone density measurement)  Completed  .  Hepatitis C: One time screening is recommended by Center for Disease Control  (CDC) for  adults born from [birth year] through 1964.   Completed  . Pneumonia vaccines  Completed  *Topic was postponed. The date shown is not the original due date.    Immunizations Immunization History  Administered Date(s) Administered  . Fluad Quad(high Dose 65+) 06/17/2019  . Influenza Split 07/05/2012, 07/27/2013, 06/29/2014  . Influenza, High Dose Seasonal PF 06/10/2017, 06/15/2018  . Influenza,inj,Quad PF,6+ Mos 06/04/2016  . Influenza-Unspecified 07/20/2015  . PFIZER SARS-COV-2 Vaccination 11/29/2019, 12/20/2019  . Pneumococcal Conjugate-13 04/06/2014  . Pneumococcal Polysaccharide-23 07/05/2012, 11/18/2017  . Tdap 10/16/2011  . Zoster 12/19/2011   Keep all routine maintenance appointments.   Follow up 10/17/20 @ 10:30  Advanced directives: End of life planning; Advance aging; Advanced directives discussed.  Copy of current HCPOA/Living Will requested.    Conditions/risks identified: none new.  Follow up in one year for your annual wellness visit.   Preventive Care 79 Years and  Older, Female Preventive care refers to lifestyle choices and visits with your health care provider that can promote health and wellness. What does preventive care include?  A yearly physical exam. This is also called an annual well check.  Dental exams once or twice a year.  Routine eye exams. Ask your health care provider how often you should have your eyes checked.  Personal lifestyle choices, including:  Daily care of your teeth and gums.  Regular physical activity.  Eating a healthy diet.  Avoiding tobacco and drug use.  Limiting alcohol use.  Practicing safe sex.  Taking low-dose aspirin every day.  Taking vitamin and mineral supplements as recommended by your health care provider. What happens during an annual well check? The services and screenings done by your health care provider during your annual well check will depend on your age, overall health, lifestyle risk factors, and family history of disease. Counseling  Your health care provider may ask you questions about your:  Alcohol use.  Tobacco use.  Drug use.  Emotional well-being.  Home and relationship well-being.  Sexual activity.  Eating habits.  History of falls.  Memory and ability to understand (cognition).  Work and work Astronomer.  Reproductive health. Screening  You may have the following tests or measurements:  Height, weight, and BMI.  Blood pressure.  Lipid and cholesterol levels. These may be checked every 5 years, or more frequently if you are over 33 years old.  Skin check.  Lung cancer  screening. You may have this screening every year starting at age 72 if you have a 30-pack-year history of smoking and currently smoke or have quit within the past 15 years.  Fecal occult blood test (FOBT) of the stool. You may have this test every year starting at age 81.  Flexible sigmoidoscopy or colonoscopy. You may have a sigmoidoscopy every 5 years or a colonoscopy every 10 years  starting at age 78.  Hepatitis C blood test.  Hepatitis B blood test.  Sexually transmitted disease (STD) testing.  Diabetes screening. This is done by checking your blood sugar (glucose) after you have not eaten for a while (fasting). You may have this done every 1-3 years.  Bone density scan. This is done to screen for osteoporosis. You may have this done starting at age 11.  Mammogram. This may be done every 1-2 years. Talk to your health care provider about how often you should have regular mammograms. Talk with your health care provider about your test results, treatment options, and if necessary, the need for more tests. Vaccines  Your health care provider may recommend certain vaccines, such as:  Influenza vaccine. This is recommended every year.  Tetanus, diphtheria, and acellular pertussis (Tdap, Td) vaccine. You may need a Td booster every 10 years.  Zoster vaccine. You may need this after age 25.  Pneumococcal 13-valent conjugate (PCV13) vaccine. One dose is recommended after age 53.  Pneumococcal polysaccharide (PPSV23) vaccine. One dose is recommended after age 31. Talk to your health care provider about which screenings and vaccines you need and how often you need them. This information is not intended to replace advice given to you by your health care provider. Make sure you discuss any questions you have with your health care provider. Document Released: 10/26/2015 Document Revised: 06/18/2016 Document Reviewed: 07/31/2015 Elsevier Interactive Patient Education  2017 [place] Prevention in the Home Falls can cause injuries. They can happen to people of all ages. There are many things you can do to make your home safe and to help prevent falls. What can I do on the outside of my home?  Regularly fix the edges of walkways and driveways and fix any cracks.  Remove anything that might make you trip as you walk through a door, such as a raised step or  threshold.  Trim any bushes or trees on the path to your home.  Use bright outdoor lighting.  Clear any walking paths of anything that might make someone trip, such as rocks or tools.  Regularly check to see if handrails are loose or broken. Make sure that both sides of any steps have handrails.  Any raised decks and porches should have guardrails on the edges.  Have any leaves, snow, or ice cleared regularly.  Use sand or salt on walking paths during winter.  Clean up any spills in your garage right away. This includes oil or grease spills. What can I do in the bathroom?  Use night lights.  Install grab bars by the toilet and in the tub and shower. Do not use towel bars as grab bars.  Use non-skid mats or decals in the tub or shower.  If you need to sit down in the shower, use a plastic, non-slip stool.  Keep the floor dry. Clean up any water that spills on the floor as soon as it happens.  Remove soap buildup in the tub or shower regularly.  Attach bath mats securely with double-sided non-slip rug tape.  Do not have throw rugs and other things on the floor that can make you trip. What can I do in the bedroom?  Use night lights.  Make sure that you have a light by your bed that is easy to reach.  Do not use any sheets or blankets that are too big for your bed. They should not hang down onto the floor.  Have a firm chair that has side arms. You can use this for support while you get dressed.  Do not have throw rugs and other things on the floor that can make you trip. What can I do in the kitchen?  Clean up any spills right away.  Avoid walking on wet floors.  Keep items that you use a lot in easy-to-reach places.  If you need to reach something above you, use a strong step stool that has a grab bar.  Keep electrical cords out of the way.  Do not use floor polish or wax that makes floors slippery. If you must use wax, use non-skid floor wax.  Do not have  throw rugs and other things on the floor that can make you trip. What can I do with my stairs?  Do not leave any items on the stairs.  Make sure that there are handrails on both sides of the stairs and use them. Fix handrails that are broken or loose. Make sure that handrails are as long as the stairways.  Check any carpeting to make sure that it is firmly attached to the stairs. Fix any carpet that is loose or worn.  Avoid having throw rugs at the top or bottom of the stairs. If you do have throw rugs, attach them to the floor with carpet tape.  Make sure that you have a light switch at the top of the stairs and the bottom of the stairs. If you do not have them, ask someone to add them for you. What else can I do to help prevent falls?  Wear shoes that:  Do not have high heels.  Have rubber bottoms.  Are comfortable and fit you well.  Are closed at the toe. Do not wear sandals.  If you use a stepladder:  Make sure that it is fully opened. Do not climb a closed stepladder.  Make sure that both sides of the stepladder are locked into place.  Ask someone to hold it for you, if possible.  Clearly mark and make sure that you can see:  Any grab bars or handrails.  First and last steps.  Where the edge of each step is.  Use tools that help you move around (mobility aids) if they are needed. These include:  Canes.  Walkers.  Scooters.  Crutches.  Turn on the lights when you go into a dark area. Replace any light bulbs as soon as they burn out.  Set up your furniture so you have a clear path. Avoid moving your furniture around.  If any of your floors are uneven, fix them.  If there are any pets around you, be aware of where they are.  Review your medicines with your doctor. Some medicines can make you feel dizzy. This can increase your chance of falling. Ask your doctor what other things that you can do to help prevent falls. This information is not intended to  replace advice given to you by your health care provider. Make sure you discuss any questions you have with your health care provider. Document Released: 07/26/2009 Document Revised:  03/06/2016 Document Reviewed: 11/03/2014 Elsevier Interactive Patient Education  2017 [employer].

## 2020-10-17 ENCOUNTER — Encounter: Payer: Self-pay | Admitting: Internal Medicine

## 2020-10-17 ENCOUNTER — Other Ambulatory Visit: Payer: Self-pay

## 2020-10-17 ENCOUNTER — Ambulatory Visit (INDEPENDENT_AMBULATORY_CARE_PROVIDER_SITE_OTHER): Payer: PPO | Admitting: Internal Medicine

## 2020-10-17 VITALS — BP 138/70 | HR 70 | Temp 98.5°F | Ht 64.0 in | Wt 146.0 lb

## 2020-10-17 DIAGNOSIS — E1151 Type 2 diabetes mellitus with diabetic peripheral angiopathy without gangrene: Secondary | ICD-10-CM | POA: Diagnosis not present

## 2020-10-17 DIAGNOSIS — E034 Atrophy of thyroid (acquired): Secondary | ICD-10-CM | POA: Diagnosis not present

## 2020-10-17 DIAGNOSIS — J42 Unspecified chronic bronchitis: Secondary | ICD-10-CM | POA: Diagnosis not present

## 2020-10-17 DIAGNOSIS — D6869 Other thrombophilia: Secondary | ICD-10-CM

## 2020-10-17 DIAGNOSIS — I4891 Unspecified atrial fibrillation: Secondary | ICD-10-CM | POA: Diagnosis not present

## 2020-10-17 DIAGNOSIS — Z23 Encounter for immunization: Secondary | ICD-10-CM

## 2020-10-17 MED ORDER — AMLODIPINE BESYLATE 5 MG PO TABS: 5.0000 mg | ORAL_TABLET | Freq: Every day | ORAL | 0 refills | Status: DC

## 2020-10-17 NOTE — Patient Instructions (Signed)
Continue a total of 5 mg of amlodipine daily.  New prescription will be on file at your pharmacy  Continue metoprolol and losartan as you are doing   Return in February for fasting labs and see me in march    Go for a walk every day if the weather is sunny and over 40 degrees!  You need the exercise and the sunshine !

## 2020-10-17 NOTE — Progress Notes (Unsigned)
Subjective:  Patient ID: Jane Davidson, female    DOB: 29-Jan-1941  Age: 80 y.o. MRN: [id number]  CC: The primary encounter diagnosis was Need for immunization against influenza. Diagnoses of Chronic bronchitis, unspecified chronic bronchitis type (HCC), Type 2 diabetes mellitus with diabetic peripheral angiopathy without gangrene, without long-term current use of insulin (HCC), Atrial fibrillation with rapid ventricular response (HCC), Acquired thrombophilia (HCC), and Hypothyroidism due to acquired atrophy of thyroid were also pertinent to this visit.  HPI NATALEA SUTLIFF presents for follow up on chronic issues   This visit occurred during the SARS-CoV-2 public health emergency.  Safety protocols were in place, including screening questions prior to the visit, additional usage of staff PPE, and extensive cleaning of exam room while observing appropriate contact time as indicated for disinfecting solutions.   Has had 2  Doses of the COVDI 19 vaccine,  Doesn't want the 3rd.   COPD:  She has been asymptomatic and has had No trouble breathing,  She has a Chronic cough due to COPD.    HTN:  She is taking metoprolol 25 mg bid,  Amlodipine 5 mg  And losartan 100 mg daily .  Home readings are < 140/80   Outpatient Medications Prior to Visit  Medication Sig Dispense Refill  . ADVAIR DISKUS 100-50 MCG/DOSE AEPB INHALE 1 PUFF BY MOUTH TWICE DAILY 60 each 2  . augmented betamethasone dipropionate (DIPROLENE-AF) 0.05 % cream   1  . Cholecalciferol (VITAMIN D3) 1000 UNITS CAPS Take 1 capsule by mouth daily.    . clobetasol ointment (TEMOVATE) 0.05 % Apply 1 application topically 2 (two) times daily. 30 g 5  . fluticasone (FLONASE) 50 MCG/ACT nasal spray SHAKE LIQUID AND USE 2 SPRAYS IN EACH NOSTRIL DAILY 16 g 2  . levothyroxine (SYNTHROID) 88 MCG tablet Take 1 tablet (88 mcg total) by mouth daily. 90 tablet 3  . loratadine (CLARITIN) 10 MG tablet Take 1 tablet (10 mg total) by mouth daily. 30  tablet 11  . losartan (COZAAR) 100 MG tablet TAKE 1 TABLET(100 MG) BY MOUTH DAILY 90 tablet 1  . nitroGLYCERIN (NITROSTAT) 0.4 MG SL tablet Place 1 tablet (0.4 mg total) under the tongue every 5 (five) minutes as needed for chest pain. 50 tablet 3  . sertraline (ZOLOFT) 50 MG tablet Take 1 tablet (50 mg total) by mouth daily. 90 tablet 1  . simvastatin (ZOCOR) 20 MG tablet TAKE 1 TABLET(20 MG) BY MOUTH DAILY 90 tablet 1  . traMADol (ULTRAM) 50 MG tablet TAKE 1 TABLET(50 MG) BY MOUTH EVERY 6 HOURS AS NEEDED 120 tablet 0  . triamcinolone cream (KENALOG) 0.1 % Apply 1 application topically 2 (two) times daily. 30 g 0  . amLODipine (NORVASC) 2.5 MG tablet TAKE 1 TABLET(2.5 MG) BY MOUTH DAILY (Patient taking differently: Take 5 mg by mouth daily. TAKE 2 TABLET(2.5 MG) BY MOUTH DAILY) 90 tablet 3  . apixaban (ELIQUIS) 5 MG TABS tablet Take 1 tablet (5 mg total) by mouth 2 (two) times daily. 60 tablet 0  . metoprolol tartrate (LOPRESSOR) 25 MG tablet Take 1 tablet (25 mg total) by mouth 2 (two) times daily. 60 tablet 0   No facility-administered medications prior to visit.    Review of Systems;  Patient denies headache, fevers, malaise, unintentional weight loss, skin rash, eye pain, sinus congestion and sinus pain, sore throat, dysphagia,  hemoptysis , cough, dyspnea, wheezing, chest pain, palpitations, orthopnea, edema, abdominal pain, nausea, melena, diarrhea, constipation, flank pain, dysuria, hematuria, urinary  Frequency, nocturia, numbness, tingling, seizures,  Focal weakness, Loss of consciousness,  Tremor, insomnia, depression, anxiety, and suicidal ideation.      Objective:  BP 138/70   Pulse 70   Temp 98.5 F (36.9 C)   Ht 5\' 4"  (1.626 m)   Wt 146 lb (66.2 kg)   SpO2 98%   BMI 25.06 kg/m   BP Readings from Last 3 Encounters:  10/17/20 138/70  04/13/20 (!) 156/60  04/08/20 (!) 152/75    Wt Readings from Last 3 Encounters:  10/17/20 146 lb (66.2 kg)  10/15/20 144 lb (65.3 kg)   04/13/20 144 lb 3.2 oz (65.4 kg)    General appearance: alert, cooperative and appears stated age Ears: normal TM's and external ear canals both ears Throat: lips, mucosa, and tongue normal; teeth and gums normal Neck: no adenopathy, no carotid bruit, supple, symmetrical, trachea midline and thyroid not enlarged, symmetric, no tenderness/mass/nodules Back: symmetric, no curvature. ROM normal. No CVA tenderness. Lungs: clear to auscultation bilaterally Heart: regular rate and rhythm, S1, S2 normal, no murmur, click, rub or gallop Abdomen: soft, non-tender; bowel sounds normal; no masses,  no organomegaly Pulses: 2+ and symmetric Skin: Skin color, texture, turgor normal. No rashes or lesions Lymph nodes: Cervical, supraclavicular, and axillary nodes normal.  Lab Results  Component Value Date   HGBA1C 5.9 05/25/2020   HGBA1C 5.8 (H) 04/07/2020   HGBA1C 6.0 11/09/2019    Lab Results  Component Value Date   CREATININE 0.81 07/12/2020   CREATININE 0.89 05/25/2020   CREATININE 0.83 04/08/2020    Lab Results  Component Value Date   WBC 7.6 05/25/2020   HGB 12.2 05/25/2020   HCT 35.9 (L) 05/25/2020   PLT 248.0 05/25/2020   GLUCOSE 85 07/12/2020   CHOL 147 05/25/2020   TRIG 136.0 05/25/2020   HDL 51.60 05/25/2020   LDLDIRECT 69.0 05/07/2017   LDLCALC 68 05/25/2020   ALT 9 05/25/2020   AST 16 05/25/2020   NA 131 (L) 07/12/2020   K 4.5 07/12/2020   CL 99 07/12/2020   CREATININE 0.81 07/12/2020   BUN 12 07/12/2020   CO2 26 07/12/2020   TSH 3.16 07/12/2020   INR 1.0 04/08/2020   HGBA1C 5.9 05/25/2020   MICROALBUR 1.9 11/09/2019    ECHOCARDIOGRAM COMPLETE  Result Date: 04/08/2020    ECHOCARDIOGRAM REPORT   Patient Name:   IRAN BLACKNER Date of Exam: 04/08/2020 Medical Rec #:  [id number]        Height:       64.0 in Accession #:    [id number]       Weight:       141.0 lb Date of Birth:  [birth year]       BSA:          1.686 m Patient Age:    61 years         BP:            108/63 mmHg Patient Gender: F                HR:           67 bpm. Exam Location:  [facility] Procedure: 2D Echo Indications:     Atrial Fibrillation 427.31/ I48.91  History:         Patient has no prior history of Echocardiogram examinations.  Sonographer:     Arville Go RDCS Referring Phys:  [id number] Cleaster Corin PATEL Diagnosing Phys: Mertie Moores MD IMPRESSIONS  1. Left ventricular ejection  fraction, by estimation, is 65 to 70%. The left ventricle has normal function. The left ventricle has no regional wall motion abnormalities. There is moderate left ventricular hypertrophy. Left ventricular diastolic parameters were normal.  2. Right ventricular systolic function is normal. The right ventricular size is normal.  3. The mitral valve is grossly normal. No evidence of mitral valve regurgitation. No evidence of mitral stenosis.  4. The aortic valve is tricuspid. Aortic valve regurgitation is not visualized. Mild aortic valve sclerosis is present, with no evidence of aortic valve stenosis. FINDINGS  Left Ventricle: Left ventricular ejection fraction, by estimation, is 65 to 70%. The left ventricle has normal function. The left ventricle has no regional wall motion abnormalities. The left ventricular internal cavity size was normal in size. There is  moderate left ventricular hypertrophy. Left ventricular diastolic parameters were normal. Right Ventricle: The right ventricular size is normal. No increase in right ventricular wall thickness. Right ventricular systolic function is normal. Left Atrium: Left atrial size was normal in size. Right Atrium: Right atrial size was normal in size. Pericardium: There is no evidence of pericardial effusion. Mitral Valve: The mitral valve is grossly normal. No evidence of mitral valve regurgitation. No evidence of mitral valve stenosis. Tricuspid Valve: The tricuspid valve is grossly normal. Tricuspid valve regurgitation is mild . No evidence of tricuspid stenosis. Aortic Valve: The  aortic valve is tricuspid. Aortic valve regurgitation is not visualized. Mild aortic valve sclerosis is present, with no evidence of aortic valve stenosis. There is mild calcification of the aortic valve. Aortic valve peak gradient measures 7.8 mmHg. Pulmonic Valve: The pulmonic valve was grossly normal. Pulmonic valve regurgitation is not visualized. Aorta: The aortic root is normal in size and structure. IAS/Shunts: The atrial septum is grossly normal.  LEFT VENTRICLE PLAX 2D LVIDd:         3.75 cm  Diastology LVIDs:         2.61 cm  LV e' lateral:   8.27 cm/s LV PW:         1.36 cm  LV E/e' lateral: 10.4 LV IVS:        1.30 cm  LV e' medial:    7.83 cm/s LVOT diam:     2.00 cm  LV E/e' medial:  11.0 LV SV:         66 LV SV Index:   39 LVOT Area:     3.14 cm  RIGHT VENTRICLE RV Basal diam:  2.63 cm RV S prime:     14.80 cm/s TAPSE (M-mode): 2.9 cm LEFT ATRIUM             Index       RIGHT ATRIUM           Index LA diam:        4.00 cm 2.37 cm/m  RA Area:     12.30 cm LA Vol (A2C):   25.0 ml 14.83 ml/m RA Volume:   29.80 ml  17.67 ml/m LA Vol (A4C):   35.8 ml 21.23 ml/m LA Biplane Vol: 31.4 ml 18.62 ml/m  AORTIC VALVE                PULMONIC VALVE AV Area (Vmax): 1.84 cm    PV Vmax:       1.02 m/s AV Vmax:        139.50 cm/s PV Peak grad:  4.2 mmHg AV Peak Grad:   7.8 mmHg LVOT Vmax:      81.60 cm/s  LVOT Vmean:     56.400 cm/s LVOT VTI:       0.209 m  AORTA Ao Root diam: 2.80 cm Ao Asc diam:  2.90 cm MITRAL VALVE               TRICUSPID VALVE MV Area (PHT): 2.99 cm    TV Peak grad:   26.7 mmHg MV Decel Time: 254 msec    TV Vmax:        2.58 m/s MV E velocity: 86.10 cm/s MV A velocity: 83.10 cm/s  SHUNTS MV E/A ratio:  1.04        Systemic VTI:  0.21 m                            Systemic Diam: 2.00 cm Mertie Moores MD Electronically signed by Mertie Moores MD Signature Date/Time: 04/08/2020/3:07:06 PM    Final     Assessment & Plan:   Problem List Items Addressed This Visit      Unprioritized    Acquired thrombophilia ([place])   Relevant Orders   TSH   Atrial fibrillation with rapid ventricular response ([place])    Currently rate controlled on metoprolol 25 mg bid. . Continue Eliquis       Relevant Medications   amLODipine (NORVASC) 5 MG tablet   COPD (chronic obstructive pulmonary disease) (HCC)   Hypothyroidism    Thyroid function is WNL on current dose.  No current changes needed.   Lab Results  Component Value Date   TSH 3.16 07/12/2020         Type 2 diabetes mellitus with diabetic peripheral angiopathy without gangrene, without long-term current use of insulin (HCC)     Remains well-controlled on diet alone.  hemoglobin A1c has been consistently at or  less than 7.0 checked every 6 months  . Patient is up-to-date on eye exams and foot exam has been  normal  Patient has no proteinuria and is tolerating statin therapy for CAD risk reduction.  BP has been elevated on the last two visits,  Given her ongoing tobacco abuse, will recommend continued attempts at smoking cessation,  Continued use of daily aspirin  81 mg and  Adherence to regimen of metoprolol and losartan .  Lab Results  Component Value Date   HGBA1C 5.9 05/25/2020   Lab Results  Component Value Date   MICROALBUR 1.9 11/09/2019         Relevant Medications   amLODipine (NORVASC) 5 MG tablet   Other Relevant Orders   Hemoglobin A1c   Lipid panel   Comprehensive metabolic panel   Microalbumin / creatinine urine ratio    Other Visit Diagnoses    Need for immunization against influenza    -  Primary   Relevant Orders   Flu Vaccine QUAD High Dose(Fluad) (Completed)      I have changed Leda Gauze D. Nhem's amLODipine. I am also having her maintain her Vitamin D3, clobetasol ointment, sertraline, loratadine, augmented betamethasone dipropionate, nitroGLYCERIN, triamcinolone, Advair Diskus, traMADol, apixaban, metoprolol tartrate, levothyroxine, simvastatin, losartan, and fluticasone.  Meds ordered this  encounter  Medications  . amLODipine (NORVASC) 5 MG tablet    Sig: Take 1 tablet (5 mg total) by mouth daily.    Dispense:  90 tablet    Refill:  0    PHARMACIST NOTE DOSE CHANGE AND KEEP ON FILE FOR NEXT REFILL    Medications Discontinued During This Encounter  Medication Reason  .  amLODipine (NORVASC) 2.5 MG tablet     Follow-up: No follow-ups on file.   Crecencio Mc, MD

## 2020-10-18 DIAGNOSIS — I4891 Unspecified atrial fibrillation: Secondary | ICD-10-CM | POA: Diagnosis not present

## 2020-10-18 DIAGNOSIS — E1151 Type 2 diabetes mellitus with diabetic peripheral angiopathy without gangrene: Secondary | ICD-10-CM | POA: Diagnosis not present

## 2020-10-18 DIAGNOSIS — D6869 Other thrombophilia: Secondary | ICD-10-CM | POA: Diagnosis not present

## 2020-10-18 DIAGNOSIS — J42 Unspecified chronic bronchitis: Secondary | ICD-10-CM | POA: Diagnosis not present

## 2020-10-18 DIAGNOSIS — Z23 Encounter for immunization: Secondary | ICD-10-CM

## 2020-10-18 DIAGNOSIS — E034 Atrophy of thyroid (acquired): Secondary | ICD-10-CM | POA: Diagnosis not present

## 2020-10-18 NOTE — Assessment & Plan Note (Signed)
Currently rate controlled on metoprolol 25 mg bid. . Continue Eliquis

## 2020-10-18 NOTE — Assessment & Plan Note (Signed)
Thyroid function is WNL on current dose.  No current changes needed.   Lab Results  Component Value Date   TSH 3.16 07/12/2020

## 2020-10-18 NOTE — Assessment & Plan Note (Signed)
Remains well-controlled on diet alone.  hemoglobin A1c has been consistently at or  less than 7.0 checked every 6 months  . Patient is up-to-date on eye exams and foot exam has been  normal  Patient has no proteinuria and is tolerating statin therapy for CAD risk reduction.  BP has been elevated on the last two visits,  Given her ongoing tobacco abuse, will recommend continued attempts at smoking cessation,  Continued use of daily aspirin  81 mg and  Adherence to regimen of metoprolol and losartan .  Lab Results  Component Value Date   HGBA1C 5.9 05/25/2020   Lab Results  Component Value Date   MICROALBUR 1.9 11/09/2019

## 2020-12-07 ENCOUNTER — Other Ambulatory Visit: Payer: Self-pay

## 2020-12-07 ENCOUNTER — Other Ambulatory Visit (INDEPENDENT_AMBULATORY_CARE_PROVIDER_SITE_OTHER): Payer: PPO

## 2020-12-07 DIAGNOSIS — E1151 Type 2 diabetes mellitus with diabetic peripheral angiopathy without gangrene: Secondary | ICD-10-CM

## 2020-12-07 DIAGNOSIS — D6869 Other thrombophilia: Secondary | ICD-10-CM

## 2020-12-07 LAB — LIPID PANEL
Cholesterol: 130 mg/dL (ref 0–200)
HDL: 58.8 mg/dL (ref >39.00)
LDL Cholesterol: 59 mg/dL (ref 0–99)
NonHDL: 70.91
Total CHOL/HDL Ratio: 2
Triglycerides: 60 mg/dL (ref 0.0–149.0)
VLDL: 12 mg/dL (ref 0.0–40.0)

## 2020-12-07 LAB — MICROALBUMIN / CREATININE URINE RATIO
Creatinine,U: 117.7 mg/dL
Microalb Creat Ratio: 1.3 mg/g (ref 0.0–30.0)
Microalb, Ur: 1.5 mg/dL (ref 0.0–1.9)

## 2020-12-07 LAB — COMPREHENSIVE METABOLIC PANEL
ALT: 10 U/L (ref 0–35)
AST: 16 U/L (ref 0–37)
Albumin: 3.9 g/dL (ref 3.5–5.2)
Alkaline Phosphatase: 57 U/L (ref 39–117)
BUN: 16 mg/dL (ref 6–23)
CO2: 27 mEq/L (ref 19–32)
Calcium: 8.9 mg/dL (ref 8.4–10.5)
Chloride: 101 mEq/L (ref 96–112)
Creatinine, Ser: 0.84 mg/dL (ref 0.40–1.20)
GFR: 66.68 mL/min (ref >60.00)
Glucose, Bld: 98 mg/dL (ref 70–99)
Potassium: 4.3 mEq/L (ref 3.5–5.1)
Sodium: 134 mEq/L — ABNORMAL LOW (ref 135–145)
Total Bilirubin: 0.4 mg/dL (ref 0.2–1.2)
Total Protein: 6.9 g/dL (ref 6.0–8.3)

## 2020-12-07 LAB — HEMOGLOBIN A1C: Hgb A1c MFr Bld: 5.8 % (ref 4.6–6.5)

## 2020-12-07 LAB — TSH: TSH: 2.07 u[IU]/mL (ref 0.35–4.50)

## 2020-12-17 ENCOUNTER — Encounter: Payer: Self-pay | Admitting: Internal Medicine

## 2020-12-17 ENCOUNTER — Ambulatory Visit (INDEPENDENT_AMBULATORY_CARE_PROVIDER_SITE_OTHER): Payer: PPO | Admitting: Internal Medicine

## 2020-12-17 ENCOUNTER — Other Ambulatory Visit: Payer: Self-pay

## 2020-12-17 VITALS — BP 126/62 | HR 68 | Temp 98.0°F | Resp 15 | Ht 64.0 in | Wt 147.0 lb

## 2020-12-17 DIAGNOSIS — Z1231 Encounter for screening mammogram for malignant neoplasm of breast: Secondary | ICD-10-CM

## 2020-12-17 DIAGNOSIS — E871 Hypo-osmolality and hyponatremia: Secondary | ICD-10-CM | POA: Diagnosis not present

## 2020-12-17 DIAGNOSIS — J410 Simple chronic bronchitis: Secondary | ICD-10-CM

## 2020-12-17 DIAGNOSIS — E1151 Type 2 diabetes mellitus with diabetic peripheral angiopathy without gangrene: Secondary | ICD-10-CM

## 2020-12-17 DIAGNOSIS — I10 Essential (primary) hypertension: Secondary | ICD-10-CM | POA: Diagnosis not present

## 2020-12-17 DIAGNOSIS — E119 Type 2 diabetes mellitus without complications: Secondary | ICD-10-CM

## 2020-12-17 DIAGNOSIS — D6869 Other thrombophilia: Secondary | ICD-10-CM | POA: Diagnosis not present

## 2020-12-17 DIAGNOSIS — Z716 Tobacco abuse counseling: Secondary | ICD-10-CM

## 2020-12-17 DIAGNOSIS — M5030 Other cervical disc degeneration, unspecified cervical region: Secondary | ICD-10-CM | POA: Diagnosis not present

## 2020-12-17 MED ORDER — ZOSTER VAC RECOMB ADJUVANTED 50 MCG/0.5ML IM SUSR: 0.5000 mL | Freq: Once | INTRAMUSCULAR | 1 refills | Status: AC

## 2020-12-17 NOTE — Progress Notes (Signed)
Subjective:  Patient ID: Jane Davidson, female    DOB: [birth year]  Age: 80 y.o. MRN: [id number]  CC: The primary encounter diagnosis was Encounter for screening mammogram for malignant neoplasm of breast. Diagnoses of Acquired thrombophilia ([place]), Degeneration of cervical intervertebral disc, Diabetes mellitus without complication ([place]), Tobacco abuse counseling, Type 2 diabetes mellitus with diabetic peripheral angiopathy without gangrene, without long-term current use of insulin ([place]), Hyponatremia, Essential hypertension, and Simple chronic bronchitis ([place]) were also pertinent to this visit.  HPI WILEY FLICKER presents for follow up on chronic conditions   This visit occurred during the SARS-CoV-2 public health emergency.  Safety protocols were in place, including screening questions prior to the visit, additional usage of staff PPE, and extensive cleaning of exam room while observing appropriate contact time as indicated for disinfecting solutions.  1) HTN :  Hypertension: patient  Does not check  blood pressure . Patient is not  following a reduced salt diet most days but is taking medications as prescribed (metoprolol ,  Losartan and amlodipine  2) COPD:  Still smoking,,   Has smoked since age 62.  Didn't stop during pregnancy x 2   3) Neck pain:  Having posterior neck pain right side radiates to shoulder but not beyond;  relieved with tylenol.  Hands feel numb upon waking    Uses I Pad for 30 minutes or so daily.  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 ADVAIR DISKUS 100-50 MCG/DOSE AEPB INHALE 1 PUFF BY MOUTH TWICE DAILY 60 each 2  . amLODipine (NORVASC) 5 MG tablet Take 1 tablet (5 mg total) by mouth daily. 90 tablet 0  . apixaban (ELIQUIS) 5 MG TABS tablet Take 1 tablet (5 mg total) by mouth 2 (two) times daily. 60 tablet 0  . augmented betamethasone  dipropionate (DIPROLENE-AF) 0.05 % cream   1  . Cholecalciferol (VITAMIN D3) 1000 UNITS CAPS Take 1 capsule by mouth daily.    . clobetasol ointment (TEMOVATE) 0.17 % Apply 1 application topically 2 (two) times daily. 30 g 5  . fluticasone (FLONASE) 50 MCG/ACT nasal spray SHAKE LIQUID AND USE 2 SPRAYS IN EACH NOSTRIL DAILY 16 g 2  . levothyroxine (SYNTHROID) 88 MCG tablet Take 1 tablet (88 mcg total) by mouth daily. 90 tablet 3  . loratadine (CLARITIN) 10 MG tablet Take 1 tablet (10 mg total) by mouth daily. 30 tablet 11  . losartan (COZAAR) 100 MG tablet TAKE 1 TABLET(100 MG) BY MOUTH DAILY 90 tablet 1  . nitroGLYCERIN (NITROSTAT) 0.4 MG SL tablet Place 1 tablet (0.4 mg total) under the tongue every 5 (five) minutes as needed for chest pain. 50 tablet 3  . sertraline (ZOLOFT) 50 MG tablet Take 1 tablet (50 mg total) by mouth daily. 90 tablet 1  . simvastatin (ZOCOR) 20 MG tablet TAKE 1 TABLET(20 MG) BY MOUTH DAILY 90 tablet 1  . traMADol (ULTRAM) 50 MG tablet TAKE 1 TABLET(50 MG) BY MOUTH EVERY 6 HOURS AS NEEDED 120 tablet 0  . triamcinolone cream (KENALOG) 0.1 % Apply 1 application topically 2 (two) times daily. 30 g 0  . metoprolol tartrate (LOPRESSOR) 25 MG tablet Take 1 tablet (25 mg total) by mouth 2 (two) times daily. 60 tablet 0   No facility-administered medications prior to visit.    Review of Systems;  Patient denies headache, fevers, malaise, unintentional weight  loss, skin rash, eye pain, sinus congestion and sinus pain, sore throat, dysphagia,  hemoptysis , cough, dyspnea, wheezing, chest pain, palpitations, orthopnea, edema, abdominal pain, nausea, melena, diarrhea, constipation, flank pain, dysuria, hematuria, urinary  Frequency, nocturia, numbness, tingling, seizures,  Focal weakness, Loss of consciousness,  Tremor, insomnia, depression, anxiety, and suicidal ideation.      Objective:  BP 126/62 (BP Location: Left Arm, Patient Position: Sitting, Cuff Size: Normal)   Pulse 68    Temp 98 F (36.7 C) (Oral)   Resp 15   Ht 5\' 4"  (1.626 m)   Wt 147 lb (66.7 kg)   SpO2 99%   BMI 25.23 kg/m   BP Readings from Last 3 Encounters:  12/17/20 126/62  10/17/20 138/70  04/13/20 (!) 156/60    Wt Readings from Last 3 Encounters:  12/17/20 147 lb (66.7 kg)  10/17/20 146 lb (66.2 kg)  10/15/20 144 lb (65.3 kg)    General appearance: alert, cooperative and appears stated age Ears: normal TM's and external ear canals both ears Throat: lips, mucosa, and tongue normal; teeth and gums normal Neck: no adenopathy, no carotid bruit, supple, symmetrical, trachea midline and thyroid not enlarged, symmetric, no tenderness/mass/nodules Back: symmetric, no curvature. ROM normal. No CVA tenderness. Lungs: clear to auscultation bilaterally Heart: regular rate and rhythm, S1, S2 normal, no murmur, click, rub or gallop Abdomen: soft, non-tender; bowel sounds normal; no masses,  no organomegaly Pulses: 2+ and symmetric Skin: Skin color, texture, turgor normal. No rashes or lesions Lymph nodes: Cervical, supraclavicular, and axillary nodes normal.  Lab Results  Component Value Date   HGBA1C 5.8 12/07/2020   HGBA1C 5.9 05/25/2020   HGBA1C 5.8 (H) 04/07/2020    Lab Results  Component Value Date   CREATININE 0.84 12/07/2020   CREATININE 0.81 07/12/2020   CREATININE 0.89 05/25/2020    Lab Results  Component Value Date   WBC 7.6 05/25/2020   HGB 12.2 05/25/2020   HCT 35.9 (L) 05/25/2020   PLT 248.0 05/25/2020   GLUCOSE 98 12/07/2020   CHOL 130 12/07/2020   TRIG 60.0 12/07/2020   HDL 58.80 12/07/2020   LDLDIRECT 69.0 05/07/2017   LDLCALC 59 12/07/2020   ALT 10 12/07/2020   AST 16 12/07/2020   NA 134 (L) 12/07/2020   K 4.3 12/07/2020   CL 101 12/07/2020   CREATININE 0.84 12/07/2020   BUN 16 12/07/2020   CO2 27 12/07/2020   TSH 2.07 12/07/2020   INR 1.0 04/08/2020   HGBA1C 5.8 12/07/2020   MICROALBUR 1.5 12/07/2020    ECHOCARDIOGRAM COMPLETE  Result Date:  04/08/2020    ECHOCARDIOGRAM REPORT   Patient Name:   BONNELL PLACZEK Date of Exam: 04/08/2020 Medical Rec #:  [id number]        Height:       64.0 in Accession #:    [id number]       Weight:       141.0 lb Date of Birth:  August 07, 1941       BSA:          1.686 m Patient Age:    20 years         BP:           108/63 mmHg Patient Gender: F                HR:           67 bpm. Exam Location:  [facility] Procedure: 2D Echo Indications:     Atrial  Fibrillation 427.31/ I48.91  History:         Patient has no prior history of Echocardiogram examinations.  Sonographer:     Arville Go RDCS Referring Phys:  [id number] Cleaster Corin PATEL Diagnosing Phys: Mertie Moores MD IMPRESSIONS  1. Left ventricular ejection fraction, by estimation, is 65 to 70%. The left ventricle has normal function. The left ventricle has no regional wall motion abnormalities. There is moderate left ventricular hypertrophy. Left ventricular diastolic parameters were normal.  2. Right ventricular systolic function is normal. The right ventricular size is normal.  3. The mitral valve is grossly normal. No evidence of mitral valve regurgitation. No evidence of mitral stenosis.  4. The aortic valve is tricuspid. Aortic valve regurgitation is not visualized. Mild aortic valve sclerosis is present, with no evidence of aortic valve stenosis. FINDINGS  Left Ventricle: Left ventricular ejection fraction, by estimation, is 65 to 70%. The left ventricle has normal function. The left ventricle has no regional wall motion abnormalities. The left ventricular internal cavity size was normal in size. There is  moderate left ventricular hypertrophy. Left ventricular diastolic parameters were normal. Right Ventricle: The right ventricular size is normal. No increase in right ventricular wall thickness. Right ventricular systolic function is normal. Left Atrium: Left atrial size was normal in size. Right Atrium: Right atrial size was normal in size. Pericardium: There is no  evidence of pericardial effusion. Mitral Valve: The mitral valve is grossly normal. No evidence of mitral valve regurgitation. No evidence of mitral valve stenosis. Tricuspid Valve: The tricuspid valve is grossly normal. Tricuspid valve regurgitation is mild . No evidence of tricuspid stenosis. Aortic Valve: The aortic valve is tricuspid. Aortic valve regurgitation is not visualized. Mild aortic valve sclerosis is present, with no evidence of aortic valve stenosis. There is mild calcification of the aortic valve. Aortic valve peak gradient measures 7.8 mmHg. Pulmonic Valve: The pulmonic valve was grossly normal. Pulmonic valve regurgitation is not visualized. Aorta: The aortic root is normal in size and structure. IAS/Shunts: The atrial septum is grossly normal.  LEFT VENTRICLE PLAX 2D LVIDd:         3.75 cm  Diastology LVIDs:         2.61 cm  LV e' lateral:   8.27 cm/s LV PW:         1.36 cm  LV E/e' lateral: 10.4 LV IVS:        1.30 cm  LV e' medial:    7.83 cm/s LVOT diam:     2.00 cm  LV E/e' medial:  11.0 LV SV:         66 LV SV Index:   39 LVOT Area:     3.14 cm  RIGHT VENTRICLE RV Basal diam:  2.63 cm RV S prime:     14.80 cm/s TAPSE (M-mode): 2.9 cm LEFT ATRIUM             Index       RIGHT ATRIUM           Index LA diam:        4.00 cm 2.37 cm/m  RA Area:     12.30 cm LA Vol (A2C):   25.0 ml 14.83 ml/m RA Volume:   29.80 ml  17.67 ml/m LA Vol (A4C):   35.8 ml 21.23 ml/m LA Biplane Vol: 31.4 ml 18.62 ml/m  AORTIC VALVE                PULMONIC VALVE AV Area (Vmax):  1.84 cm    PV Vmax:       1.02 m/s AV Vmax:        139.50 cm/s PV Peak grad:  4.2 mmHg AV Peak Grad:   7.8 mmHg LVOT Vmax:      81.60 cm/s LVOT Vmean:     56.400 cm/s LVOT VTI:       0.209 m  AORTA Ao Root diam: 2.80 cm Ao Asc diam:  2.90 cm MITRAL VALVE               TRICUSPID VALVE MV Area (PHT): 2.99 cm    TV Peak grad:   26.7 mmHg MV Decel Time: 254 msec    TV Vmax:        2.58 m/s MV E velocity: 86.10 cm/s MV A velocity: 83.10 cm/s   SHUNTS MV E/A ratio:  1.04        Systemic VTI:  0.21 m                            Systemic Diam: 2.00 cm Mertie Moores MD Electronically signed by Mertie Moores MD Signature Date/Time: 04/08/2020/3:07:06 PM    Final     Assessment & Plan:   Problem List Items Addressed This Visit      Unprioritized   Acquired thrombophilia ([place])    No falls  But has LOB with sudden turns .      COPD (chronic obstructive pulmonary disease) ([place])    She remains  asymptomatic .  Continue Advair, Flonase and claritin .  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  Her Current symptoms of posterior neck pain and early morning hand numbness  Are mild and relieved with tylenol and change in position . Neurologic exam is normal.  Cervical pillow,  Neck support recommended       RESOLVED: Diabetes mellitus without complication ([place])     Remains well-controlled on diet alone.  hemoglobin A1c has been consistently at or  less than 7.0 checked every 6 months  . Patient is up-to-date on eye exams and foot exam has been  normal  Patient has no proteinuria and is tolerating statin therapy for CAD risk reduction.  BP has been elevated on the last two visits,  Given her ongoing tobacco abuse, will recommend continued attempts at smoking cessation,  Continued use of daily aspirin  81 mg and  Adherence to regimen of metoprolol and losartan .  Lab Results  Component Value Date   HGBA1C 5.8 12/07/2020   Lab Results  Component Value Date   MICROALBUR 1.5 12/07/2020         Essential hypertension    Well controlled on current regimen of amlodipine, losartan and  metoprolol    Renal function stable, no changes today.  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  . Will d/c hctz if sodium drops below 130  Lab Results  Component Value Date   NA 134 (L) 12/07/2020   K 4.3  12/07/2020   CL 101 12/07/2020   CO2 27 12/07/2020         Screening for breast cancer - Primary   Relevant Orders   MM DIGITAL SCREENING BILATERAL   Tobacco abuse counseling    Smoking cessation instruction/counseling given.   Risks of continued tobacco use were discussed. She is not currently interested in tobacco cessation.       Type 2 diabetes mellitus with diabetic peripheral angiopathy without gangrene, without long-term current use of insulin ([place])     Remains well-controlled on diet alone.  hemoglobin A1c has been consistently at or  less than 7.0 checked every 6 months  . Patient is up-to-date on eye exams and foot exam has been  normal  Patient has no proteinuria and is tolerating statin therapy for CAD risk reduction.  BP has been elevated on the last two visits,  Given her ongoing tobacco abuse, will recommend continued attempts at smoking cessation,  Continued use of daily aspirin  81 mg and  Adherence to regimen of metoprolol and losartan .  Lab Results  Component Value Date   HGBA1C 5.8 12/07/2020   Lab Results  Component Value Date   MICROALBUR 1.5 12/07/2020            I am having Jane Davidson start on Zoster Vaccine Adjuvanted. I am also having her maintain her Vitamin D3, clobetasol ointment, sertraline, loratadine, augmented betamethasone dipropionate, nitroGLYCERIN, triamcinolone, Advair Diskus, traMADol, apixaban, metoprolol tartrate, levothyroxine, simvastatin, losartan, fluticasone, and amLODipine.  Meds ordered this encounter  Medications  . Zoster Vaccine Adjuvanted [facility]) injection    Sig: Inject 0.5 mLs into the muscle once for 1 dose.    Dispense:  1 each    Refill:  1    There are no discontinued  medications.  Follow-up: Return in about 6 months (around 06/19/2021).   Crecencio Mc, MD

## 2020-12-17 NOTE — Assessment & Plan Note (Signed)
No falls  But has LOB with sudden turns .

## 2020-12-17 NOTE — Patient Instructions (Addendum)
You are taking 3 blood pressure medications.  Keep doing what you are doing!  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  This will be aggravated by doing things that casue you to bow your head.  Also aggravated by lack of neck support at night.  Get yourself a neck pillow to support your neck both in sleep .  Get a "airplane pillow" (horsehoe shaped)  For travel or reclining   Your A1c, thyroid ,cholesterol,  liver and kidney function are all excellent ,  Continue your current medications and  Please Plan to repeat fasting labs  6 months

## 2020-12-18 NOTE — Assessment & Plan Note (Signed)
Remains well-controlled on diet alone.  hemoglobin A1c has been consistently at or  less than 7.0 checked every 6 months  . Patient is up-to-date on eye exams and foot exam has been  normal  Patient has no proteinuria and is tolerating statin therapy for CAD risk reduction.  BP has been elevated on the last two visits,  Given her ongoing tobacco abuse, will recommend continued attempts at smoking cessation,  Continued use of daily aspirin  81 mg and  Adherence to regimen of metoprolol and losartan .  Lab Results  Component Value Date   HGBA1C 5.8 12/07/2020   Lab Results  Component Value Date   MICROALBUR 1.5 12/07/2020

## 2020-12-18 NOTE — Assessment & Plan Note (Addendum)
Well controlled on current regimen of amlodipine, losartan and  metoprolol    Renal function stable, no changes today.  Lab Results  Component Value Date   CREATININE 0.84 12/07/2020   Lab Results  Component Value Date   NA 134 (L) 12/07/2020   K 4.3 12/07/2020   CL 101 12/07/2020   CO2 27 12/07/2020

## 2020-12-18 NOTE — Assessment & Plan Note (Signed)
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  Her Current symptoms of posterior neck pain and early morning hand numbness  Are mild and relieved with tylenol and change in position . Neurologic exam is normal.  Cervical pillow,  Neck support recommended

## 2020-12-18 NOTE — Assessment & Plan Note (Signed)
Smoking cessation instruction/counseling given.   Risks of continued tobacco use were discussed. She is not currently interested in tobacco cessation.

## 2020-12-18 NOTE — Assessment & Plan Note (Signed)
She remains  asymptomatic .  Continue Advair, Flonase and claritin .  encourAged to reduce cigarette use gradually

## 2020-12-18 NOTE — Assessment & Plan Note (Signed)
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  . Will d/c hctz if sodium drops below 130  Lab Results  Component Value Date   NA 134 (L) 12/07/2020   K 4.3 12/07/2020   CL 101 12/07/2020   CO2 27 12/07/2020

## 2020-12-20 ENCOUNTER — Other Ambulatory Visit: Payer: Self-pay | Admitting: Internal Medicine

## 2020-12-24 DIAGNOSIS — F333 Major depressive disorder, recurrent, severe with psychotic symptoms: Secondary | ICD-10-CM | POA: Diagnosis not present

## 2020-12-28 ENCOUNTER — Telehealth: Payer: Self-pay | Admitting: Internal Medicine

## 2021-01-15 ENCOUNTER — Other Ambulatory Visit: Payer: Self-pay | Admitting: Internal Medicine

## 2021-01-16 ENCOUNTER — Telehealth: Payer: Self-pay | Admitting: Internal Medicine

## 2021-01-16 NOTE — Telephone Encounter (Signed)
Patient called in stated that her right knee is swollen no appointments available

## 2021-01-17 NOTE — Telephone Encounter (Signed)
LMTCB

## 2021-01-22 NOTE — Telephone Encounter (Signed)
Spoke with pt and she stated that the swelling has gone done and does not need an appt at this time.

## 2021-02-11 DIAGNOSIS — B078 Other viral warts: Secondary | ICD-10-CM | POA: Diagnosis not present

## 2021-02-11 DIAGNOSIS — L280 Lichen simplex chronicus: Secondary | ICD-10-CM | POA: Diagnosis not present

## 2021-02-11 DIAGNOSIS — L820 Inflamed seborrheic keratosis: Secondary | ICD-10-CM | POA: Diagnosis not present

## 2021-02-11 DIAGNOSIS — L298 Other pruritus: Secondary | ICD-10-CM | POA: Diagnosis not present

## 2021-02-11 DIAGNOSIS — D2262 Melanocytic nevi of left upper limb, including shoulder: Secondary | ICD-10-CM | POA: Diagnosis not present

## 2021-02-11 DIAGNOSIS — Z85828 Personal history of other malignant neoplasm of skin: Secondary | ICD-10-CM | POA: Diagnosis not present

## 2021-02-11 DIAGNOSIS — D225 Melanocytic nevi of trunk: Secondary | ICD-10-CM | POA: Diagnosis not present

## 2021-02-11 DIAGNOSIS — L821 Other seborrheic keratosis: Secondary | ICD-10-CM | POA: Diagnosis not present

## 2021-02-11 DIAGNOSIS — L570 Actinic keratosis: Secondary | ICD-10-CM | POA: Diagnosis not present

## 2021-02-11 DIAGNOSIS — D485 Neoplasm of uncertain behavior of skin: Secondary | ICD-10-CM | POA: Diagnosis not present

## 2021-02-11 DIAGNOSIS — L853 Xerosis cutis: Secondary | ICD-10-CM | POA: Diagnosis not present

## 2021-02-28 DIAGNOSIS — I4891 Unspecified atrial fibrillation: Secondary | ICD-10-CM | POA: Diagnosis not present

## 2021-02-28 DIAGNOSIS — E785 Hyperlipidemia, unspecified: Secondary | ICD-10-CM | POA: Diagnosis not present

## 2021-02-28 DIAGNOSIS — Z720 Tobacco use: Secondary | ICD-10-CM | POA: Diagnosis not present

## 2021-02-28 DIAGNOSIS — I10 Essential (primary) hypertension: Secondary | ICD-10-CM | POA: Diagnosis not present

## 2021-02-28 DIAGNOSIS — I83812 Varicose veins of left lower extremities with pain: Secondary | ICD-10-CM | POA: Diagnosis not present

## 2021-02-28 DIAGNOSIS — R079 Chest pain, unspecified: Secondary | ICD-10-CM | POA: Diagnosis not present

## 2021-02-28 DIAGNOSIS — R011 Cardiac murmur, unspecified: Secondary | ICD-10-CM | POA: Diagnosis not present

## 2021-03-13 ENCOUNTER — Encounter: Payer: Self-pay | Admitting: Adult Health

## 2021-03-13 ENCOUNTER — Other Ambulatory Visit: Payer: Self-pay

## 2021-03-13 ENCOUNTER — Ambulatory Visit (INDEPENDENT_AMBULATORY_CARE_PROVIDER_SITE_OTHER): Payer: PPO | Admitting: Adult Health

## 2021-03-13 ENCOUNTER — Telehealth: Payer: Self-pay | Admitting: Internal Medicine

## 2021-03-13 VITALS — BP 144/92 | HR 80 | Temp 97.9°F | Ht 64.02 in | Wt 146.0 lb

## 2021-03-13 DIAGNOSIS — S40862A Insect bite (nonvenomous) of left upper arm, initial encounter: Secondary | ICD-10-CM | POA: Diagnosis not present

## 2021-03-13 DIAGNOSIS — W57XXXA Bitten or stung by nonvenomous insect and other nonvenomous arthropods, initial encounter: Secondary | ICD-10-CM

## 2021-03-13 DIAGNOSIS — Z7689 Persons encountering health services in other specified circumstances: Secondary | ICD-10-CM | POA: Diagnosis not present

## 2021-03-13 DIAGNOSIS — R519 Headache, unspecified: Secondary | ICD-10-CM

## 2021-03-13 MED ORDER — DOXYCYCLINE HYCLATE 100 MG PO TABS: 100.0000 mg | ORAL_TABLET | Freq: Two times a day (BID) | ORAL | 0 refills | Status: DC

## 2021-03-13 NOTE — Progress Notes (Signed)
Acute Office Visit  Subjective:    Patient ID: Jane Davidson, female    DOB: 13-Jan-1941, 80 y.o.   MRN: [id number]  Chief Complaint  Patient presents with  . Tick Removal    Pt has a tick on her left arm     HPI Patient is in today for tick bite on left upper arm tick is still in place ,this tick has been in place over 24 hours she could not remove herself.   she also removed one off her stomach. She removed tick on right elbow last week no rash.  Denies any  fever.  She has mild headache x 2 days  Denies any rash.  Denies any abdominal pan, neck pain. Patient  denies any fever, body aches,chills, rash, chest pain, shortness of breath, nausea, vomiting, or diarrhea.  Denies dizziness, lightheadedness, pre syncopal or syncopal episodes.    Past Medical History:  Diagnosis Date  . Abnormal Pap smear of cervix 2001   Biopsy normal  . Bursitis NEC   . Cancer ([place])    skin  . COPD (chronic obstructive pulmonary disease) ([place])   . History of peripheral vascular disease 2001   s/p bilateral aorto-iliac-femoral bypass /[facility]  . Hyperlipidemia   . hypothyroid   . Osteopenia 2009    T scores - 1.5 DEXA 2009  . Screening for breast cancer May 2012   [facility] mammogram  . Tobacco abuse disorder   . Vitamin D deficiency April 2011   replaced withDrsido for level of 11.3 ng/ml  . Vitamin D deficiency April 2011   replaced withDrsido for level of 11.3 ng/ml    Past Surgical History:  Procedure Laterality Date  . BREAST BIOPSY Left    benign  . CAROTID ENDARTERECTOMY Right 2016   Leotis Pain  . CATARACT EXTRACTION  Sept 2011  . CHOLECYSTECTOMY    . HERNIA REPAIR  Jun 2011   [name]   . TUBAL LIGATION      Family History  Problem Relation Age of Onset  . Hypertension Mother   . Arthritis Mother   . Diabetes Father   . Dementia Sister   . Breast cancer Neg Hx     Social History   Socioeconomic History  . Marital status: Widowed    Spouse name: Not on file  .  Number of children: Not on file  . Years of education: Not on file  . Highest education level: Not on file  Occupational History  . Not on file  Tobacco Use  . Smoking status: Current Every Day Smoker    Packs/day: 1.04    Years: 40.00    Pack years: 41.60    Types: Cigarettes  . Smokeless tobacco: Never Used  Substance and Sexual Activity  . Alcohol use: No  . Drug use: No  . Sexual activity: Never  Other Topics Concern  . Not on file  Social History Narrative  . Not on file   Social Determinants of Health   Financial Resource Strain: Low Risk   . Difficulty of Paying Living Expenses: Not hard at all  Food Insecurity: No Food Insecurity  . Worried About Charity fundraiser in the Last Year: Never true  . Ran Out of Food in the Last Year: Never true  Transportation Needs: No Transportation Needs  . Lack of Transportation (Medical): No  . Lack of Transportation (Non-Medical): No  Physical Activity: Not on file  Stress: No Stress Concern Present  .  Feeling of Stress : Only a little  Social Connections: Unknown  . Frequency of Communication with Friends and Family: More than three times a week  . Frequency of Social Gatherings with Friends and Family: Once a week  . Attends Religious Services: 1 to 4 times per year  . Active Member of Clubs or Organizations: Yes  . Attends Archivist Meetings: Not on file  . Marital Status: Not on file  Intimate Partner Violence: Not At Risk  . Fear of Current or Ex-Partner: No  . Emotionally Abused: No  . Physically Abused: No  . Sexually Abused: No    Outpatient Medications Prior to Visit  Medication Sig Dispense Refill  . ADVAIR DISKUS 100-50 MCG/DOSE AEPB INHALE 1 PUFF BY MOUTH TWICE DAILY 60 each 2  . amLODipine (NORVASC) 5 MG tablet TAKE 1 TABLET(5 MG) BY MOUTH DAILY 90 tablet 0  . augmented betamethasone dipropionate (DIPROLENE-AF) 0.05 % cream   1  . Cholecalciferol (VITAMIN D3) 1000 UNITS CAPS Take 1 capsule by  mouth daily.    . clobetasol ointment (TEMOVATE) 6.75 % Apply 1 application topically 2 (two) times daily. 30 g 5  . fluticasone (FLONASE) 50 MCG/ACT nasal spray SHAKE LIQUID AND USE 2 SPRAYS IN EACH NOSTRIL DAILY 16 g 2  . levothyroxine (SYNTHROID) 88 MCG tablet Take 1 tablet (88 mcg total) by mouth daily. 90 tablet 3  . loratadine (CLARITIN) 10 MG tablet Take 1 tablet (10 mg total) by mouth daily. 30 tablet 11  . losartan (COZAAR) 100 MG tablet TAKE 1 TABLET(100 MG) BY MOUTH DAILY 90 tablet 1  . sertraline (ZOLOFT) 50 MG tablet Take 1 tablet (50 mg total) by mouth daily. 90 tablet 1  . simvastatin (ZOCOR) 20 MG tablet TAKE 1 TABLET(20 MG) BY MOUTH DAILY 90 tablet 1  . triamcinolone cream (KENALOG) 0.1 % Apply 1 application topically 2 (two) times daily. 30 g 0  . amLODipine (NORVASC) 2.5 MG tablet Take 2.5 mg by mouth daily.    Marland Kitchen apixaban (ELIQUIS) 5 MG TABS tablet Take 1 tablet (5 mg total) by mouth 2 (two) times daily. 60 tablet 0  . metoprolol tartrate (LOPRESSOR) 25 MG tablet Take 1 tablet (25 mg total) by mouth 2 (two) times daily. 60 tablet 0  . nitroGLYCERIN (NITROSTAT) 0.4 MG SL tablet Place 1 tablet (0.4 mg total) under the tongue every 5 (five) minutes as needed for chest pain. (Patient not taking: Reported on 03/13/2021) 50 tablet 3  . traMADol (ULTRAM) 50 MG tablet TAKE 1 TABLET(50 MG) BY MOUTH EVERY 6 HOURS AS NEEDED (Patient not taking: Reported on 03/13/2021) 120 tablet 0   No facility-administered medications prior to visit.    Allergies  Allergen Reactions  . Pollen Extract   . Hctz [Hydrochlorothiazide]     Hyponatremia   . Other Cough    Other reaction(s): Other (See Comments) Running nose    Review of Systems  Constitutional: Negative.   HENT: Negative.   Respiratory: Negative.   Cardiovascular: Negative.   Musculoskeletal: Negative.   Skin: Negative.   Neurological: Positive for headaches (mild dull frontal headache started 2 days ago, denies worst headache of  life. ). Negative for dizziness, tremors, seizures, syncope, facial asymmetry, speech difficulty, weakness, light-headedness and numbness.  Psychiatric/Behavioral: Negative.        Objective:    Physical Exam Vitals reviewed.  Constitutional:      General: She is not in acute distress.    Appearance: She is well-developed.  She is not diaphoretic.     Interventions: She is not intubated. HENT:     Head: Normocephalic and atraumatic.     Right Ear: External ear normal.     Left Ear: External ear normal.     Nose: Nose normal.     Mouth/Throat:     Pharynx: No oropharyngeal exudate.  Eyes:     General: Lids are normal. No scleral icterus.       Right eye: No discharge.        Left eye: No discharge.     Conjunctiva/sclera: Conjunctivae normal.     Right eye: Right conjunctiva is not injected. No exudate or hemorrhage.    Left eye: Left conjunctiva is not injected. No exudate or hemorrhage.    Pupils: Pupils are equal, round, and reactive to light.  Neck:     Thyroid: No thyroid mass or thyromegaly.     Vascular: Normal carotid pulses. No carotid bruit, hepatojugular reflux or JVD.     Trachea: Trachea and phonation normal. No tracheal tenderness or tracheal deviation.     Meningeal: Brudzinski's sign and Kernig's sign absent.  Cardiovascular:     Rate and Rhythm: Normal rate and regular rhythm.     Pulses: Normal pulses.          Radial pulses are 2+ on the right side and 2+ on the left side.       Dorsalis pedis pulses are 2+ on the right side and 2+ on the left side.       Posterior tibial pulses are 2+ on the right side and 2+ on the left side.     Heart sounds: Normal heart sounds, S1 normal and S2 normal. Heart sounds not distant. No murmur heard. No friction rub. No gallop.   Pulmonary:     Effort: Pulmonary effort is normal. No tachypnea, bradypnea, accessory muscle usage or respiratory distress. She is not intubated.     Breath sounds: Normal breath sounds. No stridor.  No wheezing, rhonchi or rales.  Chest:     Chest wall: No tenderness.  Breasts:     Right: No supraclavicular adenopathy.     Left: No supraclavicular adenopathy.    Abdominal:     General: Bowel sounds are normal. There is no distension or abdominal bruit.     Palpations: Abdomen is soft. There is no shifting dullness, fluid wave, hepatomegaly, splenomegaly, mass or pulsatile mass.     Tenderness: There is no abdominal tenderness. There is no guarding or rebound.     Hernia: No hernia is present.  Musculoskeletal:        General: No tenderness or deformity. Normal range of motion.     Cervical back: Full passive range of motion without pain, normal range of motion and neck supple. No edema, erythema or rigidity. No spinous process tenderness or muscular tenderness. Normal range of motion.  Lymphadenopathy:     Head:     Right side of head: No submental, submandibular, tonsillar, preauricular, posterior auricular or occipital adenopathy.     Left side of head: No submental, submandibular, tonsillar, preauricular, posterior auricular or occipital adenopathy.     Cervical: No cervical adenopathy.     Right cervical: No superficial, deep or posterior cervical adenopathy.    Left cervical: No superficial, deep or posterior cervical adenopathy.     Upper Body:     Right upper body: No supraclavicular or pectoral adenopathy.     Left upper body: No supraclavicular  or pectoral adenopathy.  Skin:    General: Skin is warm and dry.     Coloration: Skin is not pale.     Findings: No abrasion, bruising, burn, ecchymosis, erythema, lesion, petechiae or rash.     Nails: There is no clubbing.       Neurological:     Mental Status: She is alert and oriented to person, place, and time.     GCS: GCS eye subscore is 4. GCS verbal subscore is 5. GCS motor subscore is 6.     Cranial Nerves: No cranial nerve deficit.     Sensory: No sensory deficit.     Motor: No tremor, atrophy, abnormal muscle tone  or seizure activity.     Coordination: Coordination normal.     Gait: Gait normal.     Deep Tendon Reflexes: Reflexes are normal and symmetric. Reflexes normal. Babinski sign absent on the right side. Babinski sign absent on the left side.     Reflex Scores:      Tricep reflexes are 2+ on the right side and 2+ on the left side.      Bicep reflexes are 2+ on the right side and 2+ on the left side.      Brachioradialis reflexes are 2+ on the right side and 2+ on the left side.      Patellar reflexes are 2+ on the right side and 2+ on the left side.      Achilles reflexes are 2+ on the right side and 2+ on the left side. Psychiatric:        Speech: Speech normal.        Behavior: Behavior normal.        Thought Content: Thought content normal.        Judgment: Judgment normal.     BP (!) 144/92   Pulse 80   Temp 97.9 F (36.6 C)   Ht 5' 4.02" (1.626 m)   Wt 146 lb (66.2 kg)   SpO2 99%   BMI 25.05 kg/m  Wt Readings from Last 3 Encounters:  03/13/21 146 lb (66.2 kg)  12/17/20 147 lb (66.7 kg)  10/17/20 146 lb (66.2 kg)    Health Maintenance Due  Topic Date Due  . Zoster Vaccines- Shingrix (1 of 2) Never done  . MAMMOGRAM  02/05/2021    There are no preventive care reminders to display for this patient.   Lab Results  Component Value Date   TSH 2.07 12/07/2020   Lab Results  Component Value Date   WBC 7.6 05/25/2020   HGB 12.2 05/25/2020   HCT 35.9 (L) 05/25/2020   MCV 91.2 05/25/2020   PLT 248.0 05/25/2020   Lab Results  Component Value Date   NA 134 (L) 12/07/2020   K 4.3 12/07/2020   CO2 27 12/07/2020   GLUCOSE 98 12/07/2020   BUN 16 12/07/2020   CREATININE 0.84 12/07/2020   BILITOT 0.4 12/07/2020   ALKPHOS 57 12/07/2020   AST 16 12/07/2020   ALT 10 12/07/2020   PROT 6.9 12/07/2020   ALBUMIN 3.9 12/07/2020   CALCIUM 8.9 12/07/2020   ANIONGAP 6 04/08/2020   GFR 66.68 12/07/2020   Lab Results  Component Value Date   CHOL 130 12/07/2020   Lab  Results  Component Value Date   HDL 58.80 12/07/2020   Lab Results  Component Value Date   LDLCALC 59 12/07/2020   Lab Results  Component Value Date   TRIG 60.0 12/07/2020  Lab Results  Component Value Date   CHOLHDL 2 12/07/2020   Lab Results  Component Value Date   HGBA1C 5.8 12/07/2020       Assessment & Plan:   Problem List Items Addressed This Visit   None   Visit Diagnoses    Tick bite, unspecified site, initial encounter    -  Primary   Relevant Medications   doxycycline (VIBRA-TABS) 100 MG tablet   Other Relevant Orders   Lyme Ab (IgG/M) + RMSF( IgG/M)   Mild headache       Relevant Medications   amLODipine (NORVASC) 2.5 MG tablet   doxycycline (VIBRA-TABS) 100 MG tablet   Other Relevant Orders   Lyme Ab (IgG/M) + RMSF( IgG/M)      Patient requested printed script, she had already started taking doxycycline Dr. Derrel Nip gave her previously for the past 2 days and it is still in date.   She will continue for a total of 14 days given mild headache.  Return for antibodies in one month and sooner if any symptoms.  Meds ordered this encounter  Medications  .     . doxycycline (VIBRA-TABS) 100 MG tablet    Sig: Take 1 tablet (100 mg total) by mouth 2 (two) times daily.    Dispense:  20 tablet    Refill:  0  Discussed tick borne illness and signs and symptoms and when to return to office.   Return in about 1 week (around 03/20/2021), or if symptoms worsen or fail to improve, for at any time for any worsening symptoms, Go to Emergency room/ urgent care if worse. Red Flags discussed. The patient was given clear instructions to go to ER or return to medical center if any red flags develop, symptoms do not improve, worsen or new problems develop. They verbalized understanding.   Marcille Buffy, FNP

## 2021-03-13 NOTE — Telephone Encounter (Signed)
FYI

## 2021-03-13 NOTE — Telephone Encounter (Signed)
Noted  

## 2021-03-13 NOTE — Patient Instructions (Addendum)
Information on tick borne illness is all below   Tick Bite Information, Adult  Ticks are insects that can bite. Most ticks live in shrubs and grassy areas. They climb onto people and animals that go by. Then they bite. Some ticks carry germs that can make you sick. How can I prevent tick bites? Take these steps: Use insect repellent  Use an insect repellent that has 20% or higher of the ingredients DEET, picaridin, or IR3535. Follow the instructions on the label. Put it on: ? Bare skin. ? The tops of your boots. ? Your pant legs. ? The ends of your sleeves.  If you use an insect repellent that has the ingredient permethrin, follow the instructions on the label. Put it on: ? Clothing. ? Boots. ? Supplies or outdoor gear. ? Tents. When you are outside  Wear long sleeves and long pants.  Wear light-colored clothes.  Tuck your pant legs into your socks.  Stay in the middle of the trail. Do not touch the bushes.  Avoid walking through long grass.  Check for ticks on your clothes, hair, and skin often while you are outside. Before going inside your house, check your clothes, skin, head, neck, armpits, waist, groin, and joint areas. When you go indoors  Check your clothes for ticks. Dry your clothes in a dryer on high heat for 10 minutes or more. If clothes are damp, additional time may be needed.  Wash your clothes right away if they need to be washed. Use hot water.  Check your pets and outdoor gear.  Shower right away.  Check your body for ticks. Do a full body check using a mirror. What is the right way to remove a tick? Remove the tick from your skin as soon as possible. Do not remove the tick with your bare fingers.  To remove a tick that is crawling on your skin: ? Go outdoors and brush the tick off. ? Use tape or a lint roller.  To remove a tick that is biting: 1. Wash your hands. 2. If you have latex gloves, put them on. 3. Use tweezers, curved forceps, or a  tick-removal tool to grasp the tick. Grasp the tick as close to your skin and as close to the tick's head as possible. 4. Gently pull up until the tick lets go.  Try to keep the tick's head attached to its body.  Do not twist or jerk the tick.  Do not squeeze or crush the tick. Do not try to remove a tick with heat, alcohol, petroleum jelly, or fingernail polish.   What should I do after taking out a tick?  Throw away the tick. Do not crush a tick with your fingers.  Clean the bite area and your hands with soap and water, rubbing alcohol, or an iodine wash.  If an antiseptic cream or ointment is available, apply a small amount to the bite area.  Wash and disinfect any instruments that you used to remove the tick. How should I get rid of a live tick? To dispose of a live tick, use one of these methods:  Place the tick in rubbing alcohol.  Place the tick in a bag or container you can close tightly.  Wrap the tick tightly in tape.  Flush the tick down the toilet. Contact a doctor if:  You have symptoms, such as: ? A fever or chills. ? A red rash that makes a circle (bull's-eye rash) in the bite area. ? Redness  and swelling where the tick bit you. ? Headache. ? Pain in a muscle, joint, or bone. ? Being more tired than normal. ? Trouble walking or moving your legs. ? Numbness in your legs. ? Tender and swollen lymph glands.  A part of a tick breaks off and gets stuck in your skin. Get help right away if:  You cannot remove a tick.  You cannot move (have paralysis) or feel weak.  You are feeling worse or have new symptoms.  You find a tick that is biting you and filled with blood. This is important if you are in an area where diseases from ticks are common. Summary  Ticks may carry germs that can make you sick.  To prevent tick bites wear long sleeves, long pants, and light colors. Use insect repellent. Follow the instructions on the label.  If the tick is biting,  do not try to remove it with heat, alcohol, petroleum jelly, or fingernail polish.  Use tweezers, curved forceps, or a tick-removal tool to grasp the tick. Gently pull up until the tick lets go. Do not twist or jerk the tick. Do not squeeze or crush the tick.  If you have symptoms, contact a doctor. This information is not intended to replace advice given to you by your health care provider. Make sure you discuss any questions you have with your health care provider. Document Revised: 09/26/2019 Document Reviewed: 09/26/2019 Elsevier Patient Education  2021 [place] Spotted Fever [facility] spotted fever is a bacterial infection that spreads to people through contact with certain ticks. The illness causes flu-like symptoms and a reddish-purple rash. The illness does not spread from person to person (is not contagious). When this condition is not treated right away, it can quickly become very serious, and can sometimes lead to long-term (chronic) health problems or even death. What are the causes? This condition is caused by a type of bacteria (Rickettsia rickettsii) that is carried by [place] dog ticks, brown dog ticks, and [employer] wood ticks. The infection spreads through:  A bite from an infected tick. Tick bites are usually painless, and they frequently are not noticed.  Infected tick blood, body fluids, or feces that get into the body through damaged skin, such as a small cut or sore. This could happen while removing a tick from a pet or from another person. What increases the risk? The following factors may make you more likely to develop this condition:  Spending a lot of time outdoors, especially in rural areas or areas with long grass.  Spending time outdoors during warm weather. Ticks are most active during warm weather. What are the signs or symptoms? Symptoms of this condition include:  Fever.  Muscle  aches.  Headache.  Nausea.  Vomiting.  Poor appetite.  Abdominal pain.  A reddish-purple rash. ? This usually appears 2-5 days after the first symptoms begin. ? The rash often starts on the wrists, forearms, and ankles. It may then spread to the palms, the bottom of the feet, legs, and trunk. Symptoms may develop 2-14 days after a tick bite.   How is this diagnosed? This condition is diagnosed based on:  Your medical history.  A physical exam.  Blood tests.  Whether you have recently been bitten by a tick or spent time in areas where: ? Ticks are common. ? [facility] spotted fever is common. How is this treated? This condition is treated with antibiotic medicines. It is important to begin  treatment right away. In some cases, your health care provider may begin treatment before the diagnosis is confirmed. If your symptoms are severe, you may need to be treated in the hospital where you can get antibiotics and be monitored during treatment. Follow these instructions at home:  Take over-the-counter and prescription medicines only as told by your health care provider.  Take your antibiotic medicine as told by your health care provider. Do not stop taking the antibiotic even if you start to feel better.  Rest as much as possible until you feel better. Return to your normal activities as told by your health care provider.  Drink enough fluid to keep your urine pale yellow.  Keep all follow-up visits as told by your health care provider. This is important. Contact a health care provider if:  You have a rash that gets increasingly red or swollen.  You have fluid draining from any areas of your rash. Get help right away if:  You develop a fever after being bitten by a tick.  You develop a rash 2-5 days after experiencing flu-like symptoms.  You have chest pain.  You have shortness of breath.  You have a severe headache.  You have jerky movements you cannot control  (seizure).  You have severe abdominal pain.  You feel confused.  You bruise easily.  You have bleeding from your gums.  You have blood in your stool (feces).  You have trouble controlling when you urinate or have bowel movements (incontinence).  You have vision problems.  You have numbness or tingling in your arms or legs. Summary  [facility] spotted fever is a bacterial infection that spreads to people through contact with certain ticks.  When this condition is not treated right away, it can quickly become very serious, and can sometimes lead to long-term (chronic) health problems or even death.  You are more likely to develop this infection if you spend time outdoors in warm weather and in areas with tall grass.  Symptoms of this condition include fever, headache, nausea, vomiting, abdominal pain, muscle aches, and a reddish-purple rash that usually appears 2-5 days after a fever.  This condition is treated with antibiotic medicines. This information is not intended to replace advice given to you by your health care provider. Make sure you discuss any questions you have with your health care provider. Document Revised: 10/02/2017 Document Reviewed: 12/25/2016 Elsevier Patient Education  [place].  Lyme Disease Lyme disease is an infection that can affect many parts of the body, including the skin, joints, and nervous system. It is a bacterial infection that starts from the bite of an infected tick. Over time, the infection can worsen, and some of the symptoms are similar to the flu. If Lyme disease is not treated, it may cause joint pain, swelling, numbness, problems thinking, fatigue, muscle weakness, and other problems. What are the causes? This condition is caused by bacteria called Borrelia burgdorferi.  You can get Lyme disease by being bitten by an infected tick.  Only black-legged, or Ixodes, ticks that are infected with the bacteria can cause Lyme  disease.  The tick must be attached to your skin for a certain period of time to pass along the infection. This is usually 36-48 hours.  Deer often carry infected ticks. What increases the risk? The following factors may make you more likely to develop this condition:  Living in or visiting these areas in the [place] ? [place]. ? [place]. ?  [place].  Spending time in wooded or grassy areas.  Being outdoors with exposed skin.  Camping, gardening, hiking, fishing, hunting, or working outdoors.  Failing to remove a tick from your skin. What are the signs or symptoms? Symptoms of this condition may include:  Chills and fever.  Headache.  Fatigue.  General achiness.  Muscle pain.  Joint pain, often in the knees.  A round, red rash that surrounds the center of the tick bite. The center of the rash may be blood colored or have tiny blisters.  Swollen lymph glands.  Stiff neck.   How is this diagnosed? This condition is diagnosed based on:  Your symptoms and medical history.  A physical exam.  A blood test. How is this treated? The main treatment for this condition is antibiotic medicine, which is usually taken by mouth (orally).  The length of treatment depends on how soon after a tick bite you begin taking the medicine. In some cases, treatment is necessary for several weeks.  If the infection is severe, antibiotics may need to be given through an IV that is inserted into one of your veins. Follow these instructions at home:  Take over-the-counter and prescription medicines only as told by your health care provider. Finish all antibiotic medicine, even when you start to feel better.  Ask your health care provider about taking a probiotic in between doses of your antibiotic to help avoid an upset stomach or diarrhea.  Check with your health care provider before supplementing your treatment. Many alternative therapies have not been  proven and may be harmful to you.  Keep all follow-up visits as told by your health care provider. This is important. How is this prevented? You can become reinfected if you get another tick bite from an infected tick. Take these steps to help prevent an infection:  Cover your skin with light-colored clothing when you are outdoors in the spring and summer months.  Spray clothing and skin with bug spray. The spray should be 20-30% DEET. You can also treat clothing with permethrin, and let it dry before you wear it. Do not apply permethrin directly to your skin. Permethrin can also be used to treat camping gear and boots. Always read and follow the instructions that come with a bug spray or insecticide.  Avoid wooded, grassy, and shaded areas.  Remove yard litter, brush, trash, and plants that attract deer and rodents.  Check yourself for ticks when you come indoors.  Wash clothing worn each day.  Shower after spending time outdoors.  Check your pets for ticks before they come inside.  If you find a tick attached to your skin: ? Remove it with tweezers. ? Clean your hands and the bite area with rubbing alcohol or soap and water. ? Dispose of the tick by putting it in rubbing alcohol, putting it in a sealed bag or container, or flushing it down the toilet. ? You may choose to save the tick in a sealed container if you wish for it to be tested at a later time. Pregnant women should take special care to avoid tick bites because it is possible that the infection may be passed along to the fetus.   Contact a health care provider if:  You have symptoms after treatment.  You have removed a tick and want to bring it to your health care provider for testing. Get help right away if:  You have an irregular heartbeat.  You have chest pain.  You  have nerve pain.  Your face feels numb.  You develop the following: ? A stiff neck. ? A severe headache. ? Severe nausea and  vomiting. ? Sensitivity to light. Summary  Lyme disease is an infection that can affect many parts of the body, including the skin, joints, and nervous system.  This condition is caused by bacteria called Borrelia burgdorferi.  You can get Lyme disease by being bitten by an infected tick.  The main treatment for this condition is antibiotic medicine. This information is not intended to replace advice given to you by your health care provider. Make sure you discuss any questions you have with your health care provider. Document Revised: 01/21/2019 Document Reviewed: 12/16/2018 Elsevier Patient Education  [place].

## 2021-03-13 NOTE — Telephone Encounter (Signed)
Patient made an appointment for today at 4pm, She has a tick on her and she can't remove it. Patient has been screened/no symptoms.

## 2021-03-14 ENCOUNTER — Encounter: Payer: Self-pay | Admitting: Adult Health

## 2021-03-14 ENCOUNTER — Telehealth: Payer: Self-pay | Admitting: Internal Medicine

## 2021-03-14 LAB — SPECIMEN STATUS REPORT

## 2021-03-14 NOTE — Telephone Encounter (Signed)
Spoke with Tirna at [facility] and stated that the order that was placed for the Lymes/RMSF panel is not the right code anymore. She stated that she would change the orders this time and gave me the correct codes for the future. Codes have been given to providers and CMAs.

## 2021-03-14 NOTE — Telephone Encounter (Signed)
Trina from [facility] called to advise that the order that was placed in was placed in as a non ordeable code and she was wanting to talk to someone to switch it out for another test. She can be reached at [facility].

## 2021-03-16 ENCOUNTER — Other Ambulatory Visit: Payer: Self-pay | Admitting: Internal Medicine

## 2021-03-19 NOTE — Progress Notes (Signed)
Lyme antibodies negative. RMSF antibodies pending on 03/19/2021 will result once resulted.

## 2021-03-20 ENCOUNTER — Other Ambulatory Visit: Payer: Self-pay

## 2021-03-20 ENCOUNTER — Ambulatory Visit (INDEPENDENT_AMBULATORY_CARE_PROVIDER_SITE_OTHER): Payer: PPO | Admitting: Adult Health

## 2021-03-20 ENCOUNTER — Encounter: Payer: Self-pay | Admitting: Adult Health

## 2021-03-20 VITALS — BP 124/78 | HR 68 | Temp 97.9°F | Ht 64.02 in | Wt 145.4 lb

## 2021-03-20 DIAGNOSIS — S40862D Insect bite (nonvenomous) of left upper arm, subsequent encounter: Secondary | ICD-10-CM

## 2021-03-20 DIAGNOSIS — W57XXXD Bitten or stung by nonvenomous insect and other nonvenomous arthropods, subsequent encounter: Secondary | ICD-10-CM

## 2021-03-20 DIAGNOSIS — R238 Other skin changes: Secondary | ICD-10-CM

## 2021-03-20 MED ORDER — TRIAMCINOLONE ACETONIDE 0.1 % EX CREA: 1.0000 "application " | TOPICAL_CREAM | Freq: Two times a day (BID) | CUTANEOUS | 0 refills | Status: DC

## 2021-03-20 NOTE — Progress Notes (Signed)
Acute Office Visit  Subjective:    Patient ID: Jane Davidson, female    DOB: 07/25/1941, 80 y.o.   MRN: [id number]  Chief Complaint  Patient presents with  . Follow-up    Pt wants to know if she should stop taking the doxycycline     HPI Patient is in today for follow up on tick bite to left upper arm. Area has no surrounding erythema.  Still has very minor irritation at tick bite.  She has had Doxycycline and is still on will finish prescribed course.  Denies any neck pain, abdominal pain or any new symptoms.   Patient  denies any fever, body aches,chills, rash, chest pain, shortness of breath, nausea, vomiting, or diarrhea.  Denies dizziness, lightheadedness, pre syncopal or syncopal episodes.    Past Medical History:  Diagnosis Date  . Abnormal Pap smear of cervix 2001   Biopsy normal  . Bursitis NEC   . Cancer ([place])    skin  . COPD (chronic obstructive pulmonary disease) ([place])   . History of peripheral vascular disease 2001   s/p bilateral aorto-iliac-femoral bypass /[facility]  . Hyperlipidemia   . hypothyroid   . Osteopenia 2009    T scores - 1.5 DEXA 2009  . Screening for breast cancer May 2012   [facility] mammogram  . Tobacco abuse disorder   . Vitamin D deficiency April 2011   replaced withDrsido for level of 11.3 ng/ml  . Vitamin D deficiency April 2011   replaced withDrsido for level of 11.3 ng/ml    Past Surgical History:  Procedure Laterality Date  . BREAST BIOPSY Left    benign  . CAROTID ENDARTERECTOMY Right 2016   Leotis Pain  . CATARACT EXTRACTION  Sept 2011  . CHOLECYSTECTOMY    . HERNIA REPAIR  Jun 2011   [name]   . TUBAL LIGATION      Family History  Problem Relation Age of Onset  . Hypertension Mother   . Arthritis Mother   . Diabetes Father   . Dementia Sister   . Breast cancer Neg Hx     Social History   Socioeconomic History  . Marital status: Widowed    Spouse name: Not on file  . Number of children: Not on file  .  Years of education: Not on file  . Highest education level: Not on file  Occupational History  . Not on file  Tobacco Use  . Smoking status: Current Every Day Smoker    Packs/day: 1.04    Years: 40.00    Pack years: 41.60    Types: Cigarettes  . Smokeless tobacco: Never Used  Substance and Sexual Activity  . Alcohol use: No  . Drug use: No  . Sexual activity: Never  Other Topics Concern  . Not on file  Social History Narrative  . Not on file   Social Determinants of Health   Financial Resource Strain: Low Risk   . Difficulty of Paying Living Expenses: Not hard at all  Food Insecurity: No Food Insecurity  . Worried About Charity fundraiser in the Last Year: Never true  . Ran Out of Food in the Last Year: Never true  Transportation Needs: No Transportation Needs  . Lack of Transportation (Medical): No  . Lack of Transportation (Non-Medical): No  Physical Activity: Not on file  Stress: No Stress Concern Present  . Feeling of Stress : Only a little  Social Connections: Unknown  . Frequency of Communication with  Friends and Family: More than three times a week  . Frequency of Social Gatherings with Friends and Family: Once a week  . Attends Religious Services: 1 to 4 times per year  . Active Member of Clubs or Organizations: Yes  . Attends Archivist Meetings: Not on file  . Marital Status: Not on file  Intimate Partner Violence: Not At Risk  . Fear of Current or Ex-Partner: No  . Emotionally Abused: No  . Physically Abused: No  . Sexually Abused: No    Outpatient Medications Prior to Visit  Medication Sig Dispense Refill  . ADVAIR DISKUS 100-50 MCG/DOSE AEPB INHALE 1 PUFF BY MOUTH TWICE DAILY 60 each 2  . amLODipine (NORVASC) 2.5 MG tablet Take 2.5 mg by mouth daily.    Marland Kitchen amLODipine (NORVASC) 5 MG tablet TAKE 1 TABLET(5 MG) BY MOUTH DAILY 90 tablet 0  . augmented betamethasone dipropionate (DIPROLENE-AF) 0.05 % cream   1  . Cholecalciferol (VITAMIN D3) 1000  UNITS CAPS Take 1 capsule by mouth daily.    . clobetasol ointment (TEMOVATE) 4.31 % Apply 1 application topically 2 (two) times daily. 30 g 5  . doxycycline (VIBRA-TABS) 100 MG tablet Take 1 tablet (100 mg total) by mouth 2 (two) times daily. 20 tablet 0  . fluticasone (FLONASE) 50 MCG/ACT nasal spray SHAKE LIQUID AND USE 2 SPRAYS IN EACH NOSTRIL DAILY 16 g 2  . levothyroxine (SYNTHROID) 88 MCG tablet Take 1 tablet (88 mcg total) by mouth daily. 90 tablet 3  . loratadine (CLARITIN) 10 MG tablet Take 1 tablet (10 mg total) by mouth daily. 30 tablet 11  . losartan (COZAAR) 100 MG tablet TAKE 1 TABLET(100 MG) BY MOUTH DAILY 90 tablet 1  . nitroGLYCERIN (NITROSTAT) 0.4 MG SL tablet Place 1 tablet (0.4 mg total) under the tongue every 5 (five) minutes as needed for chest pain. 50 tablet 3  . sertraline (ZOLOFT) 50 MG tablet Take 1 tablet (50 mg total) by mouth daily. 90 tablet 1  . simvastatin (ZOCOR) 20 MG tablet TAKE 1 TABLET(20 MG) BY MOUTH DAILY 90 tablet 1  . traMADol (ULTRAM) 50 MG tablet TAKE 1 TABLET(50 MG) BY MOUTH EVERY 6 HOURS AS NEEDED 120 tablet 0  . triamcinolone cream (KENALOG) 0.1 % Apply 1 application topically 2 (two) times daily. 30 g 0  . apixaban (ELIQUIS) 5 MG TABS tablet Take 1 tablet (5 mg total) by mouth 2 (two) times daily. 60 tablet 0  . metoprolol tartrate (LOPRESSOR) 25 MG tablet Take 1 tablet (25 mg total) by mouth 2 (two) times daily. 60 tablet 0   No facility-administered medications prior to visit.    Allergies  Allergen Reactions  . Pollen Extract   . Hctz [Hydrochlorothiazide]     Hyponatremia   . Other Cough    Other reaction(s): Other (See Comments) Running nose    Review of Systems  Constitutional: Negative.   HENT: Negative.   Respiratory: Negative.   Cardiovascular: Negative.   Genitourinary: Negative.   Musculoskeletal: Negative.   Skin: Positive for wound. Negative for color change, pallor and rash.  Hematological: Negative.         Objective:    Physical Exam Constitutional:      General: She is not in acute distress.    Appearance: She is not ill-appearing, toxic-appearing or diaphoretic.  HENT:     Head: Normocephalic and atraumatic.     Right Ear: External ear normal.     Left Ear: External ear  normal.     Nose: Nose normal.     Mouth/Throat:     Pharynx: Oropharynx is clear. No oropharyngeal exudate or posterior oropharyngeal erythema.  Eyes:     General: No scleral icterus.       Right eye: No discharge.        Left eye: No discharge.     Extraocular Movements: Extraocular movements intact.     Conjunctiva/sclera: Conjunctivae normal.     Pupils: Pupils are equal, round, and reactive to light.  Cardiovascular:     Rate and Rhythm: Normal rate and regular rhythm.     Pulses: Normal pulses.     Heart sounds: Normal heart sounds.  Pulmonary:     Effort: Pulmonary effort is normal.     Breath sounds: Normal breath sounds.  Abdominal:     Palpations: Abdomen is soft.  Skin:    General: Skin is warm.     Findings: No erythema or rash.       Neurological:     Mental Status: She is alert and oriented to person, place, and time.     Motor: No weakness.     Gait: Gait normal.  Psychiatric:        Mood and Affect: Mood normal.        Behavior: Behavior normal.        Thought Content: Thought content normal.        Judgment: Judgment normal.     BP 124/78   Pulse 68   Temp 97.9 F (36.6 C)   Ht 5' 4.02" (1.626 m)   Wt 145 lb 6.4 oz (66 kg)   SpO2 97%   BMI 24.95 kg/m  Wt Readings from Last 3 Encounters:  03/20/21 145 lb 6.4 oz (66 kg)  03/13/21 146 lb (66.2 kg)  12/17/20 147 lb (66.7 kg)    Health Maintenance Due  Topic Date Due  . Zoster Vaccines- Shingrix (1 of 2) Never done  . Pneumococcal Vaccine 28-59 Years old (1 of 2 - PPSV23) Never done  . MAMMOGRAM  02/05/2021    There are no preventive care reminders to display for this patient.   Lab Results  Component Value Date    TSH 2.07 12/07/2020   Lab Results  Component Value Date   WBC 7.6 05/25/2020   HGB 12.2 05/25/2020   HCT 35.9 (L) 05/25/2020   MCV 91.2 05/25/2020   PLT 248.0 05/25/2020   Lab Results  Component Value Date   NA 134 (L) 12/07/2020   K 4.3 12/07/2020   CO2 27 12/07/2020   GLUCOSE 98 12/07/2020   BUN 16 12/07/2020   CREATININE 0.84 12/07/2020   BILITOT 0.4 12/07/2020   ALKPHOS 57 12/07/2020   AST 16 12/07/2020   ALT 10 12/07/2020   PROT 6.9 12/07/2020   ALBUMIN 3.9 12/07/2020   CALCIUM 8.9 12/07/2020   ANIONGAP 6 04/08/2020   GFR 66.68 12/07/2020   Lab Results  Component Value Date   CHOL 130 12/07/2020   Lab Results  Component Value Date   HDL 58.80 12/07/2020   Lab Results  Component Value Date   LDLCALC 59 12/07/2020   Lab Results  Component Value Date   TRIG 60.0 12/07/2020   Lab Results  Component Value Date   CHOLHDL 2 12/07/2020   Lab Results  Component Value Date   HGBA1C 5.8 12/07/2020       Assessment & Plan:   Problem List Items Addressed This Visit  None   Visit Diagnoses    Skin irritation    -  Primary   Relevant Medications   triamcinolone cream (KENALOG) 0.1 %   Tick bite of left upper arm, subsequent encounter          Apply to area left upper arm, if not resolving return to the office.  Meds ordered this encounter  Medications  . triamcinolone cream (KENALOG) 0.1 %    Sig: Apply 1 application topically 2 (two) times daily.    Dispense:  30 g    Refill:  0   Return if symptoms worsen or fail to improve, for at any time for any worsening symptoms, Go to Emergency room/ urgent care if worse.   Red Flags discussed. The patient was given clear instructions to go to ER or return to medical center if any red flags develop, symptoms do not improve, worsen or new problems develop. They verbalized understanding.  Marcille Buffy, FNP

## 2021-03-20 NOTE — Patient Instructions (Signed)
Tick Bite Information, Adult  Ticks are insects that can bite. Most ticks live in shrubs and grassy areas. They climb onto people and animals that go by. Then they bite. Some ticks carry germs that can make you sick. How can I prevent tick bites? Take these steps: Use insect repellent  Use an insect repellent that has 20% or higher of the ingredients DEET, picaridin, or IR3535. Follow the instructions on the label. Put it on: ? Bare skin. ? The tops of your boots. ? Your pant legs. ? The ends of your sleeves.  If you use an insect repellent that has the ingredient permethrin, follow the instructions on the label. Put it on: ? Clothing. ? Boots. ? Supplies or outdoor gear. ? Tents. When you are outside  Wear long sleeves and long pants.  Wear light-colored clothes.  Tuck your pant legs into your socks.  Stay in the middle of the trail. Do not touch the bushes.  Avoid walking through long grass.  Check for ticks on your clothes, hair, and skin often while you are outside. Before going inside your house, check your clothes, skin, head, neck, armpits, waist, groin, and joint areas. When you go indoors  Check your clothes for ticks. Dry your clothes in a dryer on high heat for 10 minutes or more. If clothes are damp, additional time may be needed.  Wash your clothes right away if they need to be washed. Use hot water.  Check your pets and outdoor gear.  Shower right away.  Check your body for ticks. Do a full body check using a mirror. What is the right way to remove a tick? Remove the tick from your skin as soon as possible. Do not remove the tick with your bare fingers.  To remove a tick that is crawling on your skin: ? Go outdoors and brush the tick off. ? Use tape or a lint roller.  To remove a tick that is biting: 1. Wash your hands. 2. If you have latex gloves, put them on. 3. Use tweezers, curved forceps, or a tick-removal tool to grasp the tick. Grasp the tick  as close to your skin and as close to the tick's head as possible. 4. Gently pull up until the tick lets go.  Try to keep the tick's head attached to its body.  Do not twist or jerk the tick.  Do not squeeze or crush the tick. Do not try to remove a tick with heat, alcohol, petroleum jelly, or fingernail polish.   What should I do after taking out a tick?  Throw away the tick. Do not crush a tick with your fingers.  Clean the bite area and your hands with soap and water, rubbing alcohol, or an iodine wash.  If an antiseptic cream or ointment is available, apply a small amount to the bite area.  Wash and disinfect any instruments that you used to remove the tick. How should I get rid of a live tick? To dispose of a live tick, use one of these methods:  Place the tick in rubbing alcohol.  Place the tick in a bag or container you can close tightly.  Wrap the tick tightly in tape.  Flush the tick down the toilet. Contact a doctor if:  You have symptoms, such as: ? A fever or chills. ? A red rash that makes a circle (bull's-eye rash) in the bite area. ? Redness and swelling where the tick bit you. ? Headache. ?  Pain in a muscle, joint, or bone. ? Being more tired than normal. ? Trouble walking or moving your legs. ? Numbness in your legs. ? Tender and swollen lymph glands.  A part of a tick breaks off and gets stuck in your skin. Get help right away if:  You cannot remove a tick.  You cannot move (have paralysis) or feel weak.  You are feeling worse or have new symptoms.  You find a tick that is biting you and filled with blood. This is important if you are in an area where diseases from ticks are common. Summary  Ticks may carry germs that can make you sick.  To prevent tick bites wear long sleeves, long pants, and light colors. Use insect repellent. Follow the instructions on the label.  If the tick is biting, do not try to remove it with heat, alcohol, petroleum  jelly, or fingernail polish.  Use tweezers, curved forceps, or a tick-removal tool to grasp the tick. Gently pull up until the tick lets go. Do not twist or jerk the tick. Do not squeeze or crush the tick.  If you have symptoms, contact a doctor. This information is not intended to replace advice given to you by your health care provider. Make sure you discuss any questions you have with your health care provider. Document Revised: 09/26/2019 Document Reviewed: 09/26/2019 Elsevier Patient Education  2021 [employer].

## 2021-03-21 ENCOUNTER — Other Ambulatory Visit: Payer: Self-pay | Admitting: Internal Medicine

## 2021-03-21 DIAGNOSIS — Z1231 Encounter for screening mammogram for malignant neoplasm of breast: Secondary | ICD-10-CM

## 2021-03-22 LAB — LYME DISEASE SEROLOGY W/REFLEX: Lyme Total Antibody EIA: NEGATIVE

## 2021-03-22 LAB — RMSF, IGG, IFA: RMSF, IGG, IFA: 1:64 {titer} — ABNORMAL HIGH

## 2021-03-22 LAB — ROCKY MTN SPOTTED FVR AB, IGG-BLOOD: RMSF IgG: POSITIVE — AB

## 2021-03-22 LAB — SPECIMEN STATUS REPORT

## 2021-03-22 LAB — ROCKY MTN SPOTTED FVR AB, IGM-BLOOD: RMSF IgM: 0.52 index (ref 0.00–0.89)

## 2021-03-23 NOTE — Progress Notes (Signed)
Negative IGM for RMSF. Does have IGG positive but she has had in the past and this is consistent with past infection.  Return to office if any new symptoms or any illness,

## 2021-03-25 ENCOUNTER — Ambulatory Visit
Admission: RE | Admit: 2021-03-25 | Discharge: 2021-03-25 | Disposition: A | Discharge status: Home / Self Care | Payer: PPO | Source: Ambulatory Visit | Attending: Internal Medicine | Admitting: Internal Medicine

## 2021-03-25 ENCOUNTER — Telehealth: Payer: Self-pay

## 2021-03-25 ENCOUNTER — Other Ambulatory Visit: Payer: Self-pay

## 2021-03-25 DIAGNOSIS — Z1231 Encounter for screening mammogram for malignant neoplasm of breast: Secondary | ICD-10-CM | POA: Insufficient documentation

## 2021-03-25 NOTE — Telephone Encounter (Signed)
Pt called in to Night nurse to ask about labs at 6:47pm. She questions the results of [facility] Spotted Fever. She was told to see PCP within 24 hours.

## 2021-03-26 NOTE — Telephone Encounter (Signed)
Called and explained past infection VS. Current infection patien task should she continue the Doxycycline as prescribed.

## 2021-03-26 NOTE — Telephone Encounter (Signed)
Patient will complete the Doxycycline regimen as ordered, patient voiced understanding to results.

## 2021-03-26 NOTE — Progress Notes (Signed)
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

## 2021-04-10 ENCOUNTER — Telehealth: Payer: Self-pay

## 2021-04-10 NOTE — Telephone Encounter (Signed)
Pt called and states that she found another tick on the back of her knee and wants to make sure she did not need another antibiotic. She states that this was a bigger one. Please advise

## 2021-04-11 NOTE — Telephone Encounter (Signed)
LMTCB

## 2021-04-12 NOTE — Telephone Encounter (Signed)
Pt returned your call.  

## 2021-04-12 NOTE — Telephone Encounter (Signed)
PT called to missed call

## 2021-04-12 NOTE — Telephone Encounter (Signed)
Spoke with pt and she stated that she just pulled another tick off the back of her leg. She stated that is sure how "long it was there because it was big". Pt stated that the week before she had just finished a round of doxycycline from where she had pulled another tick off. Pt stated that she is not having any symptoms, just wants to know if she needs to take more doxycycline.

## 2021-04-12 NOTE — Telephone Encounter (Signed)
LMTCB

## 2021-04-12 NOTE — Telephone Encounter (Signed)
Spoke with pt to let her know that she does not need an antibiotic unless the area of the tick bite is red and swollen. Pt stated that it is not.

## 2021-04-15 ENCOUNTER — Other Ambulatory Visit: Payer: Self-pay | Admitting: Internal Medicine

## 2021-06-14 ENCOUNTER — Other Ambulatory Visit: Payer: Self-pay | Admitting: Internal Medicine

## 2021-06-19 ENCOUNTER — Other Ambulatory Visit: Payer: Self-pay

## 2021-06-19 ENCOUNTER — Encounter: Payer: Self-pay | Admitting: Internal Medicine

## 2021-06-19 ENCOUNTER — Ambulatory Visit (INDEPENDENT_AMBULATORY_CARE_PROVIDER_SITE_OTHER): Payer: PPO | Admitting: Internal Medicine

## 2021-06-19 VITALS — BP 142/72 | HR 74 | Temp 96.4°F | Ht 64.0 in | Wt 141.0 lb

## 2021-06-19 DIAGNOSIS — J410 Simple chronic bronchitis: Secondary | ICD-10-CM | POA: Diagnosis not present

## 2021-06-19 DIAGNOSIS — D6869 Other thrombophilia: Secondary | ICD-10-CM

## 2021-06-19 DIAGNOSIS — K2900 Acute gastritis without bleeding: Secondary | ICD-10-CM | POA: Diagnosis not present

## 2021-06-19 DIAGNOSIS — I10 Essential (primary) hypertension: Secondary | ICD-10-CM

## 2021-06-19 DIAGNOSIS — E034 Atrophy of thyroid (acquired): Secondary | ICD-10-CM | POA: Diagnosis not present

## 2021-06-19 DIAGNOSIS — I700 Atherosclerosis of aorta: Secondary | ICD-10-CM | POA: Diagnosis not present

## 2021-06-19 DIAGNOSIS — B9681 Helicobacter pylori [H. pylori] as the cause of diseases classified elsewhere: Secondary | ICD-10-CM

## 2021-06-19 DIAGNOSIS — E7800 Pure hypercholesterolemia, unspecified: Secondary | ICD-10-CM

## 2021-06-19 DIAGNOSIS — Z23 Encounter for immunization: Secondary | ICD-10-CM

## 2021-06-19 DIAGNOSIS — E1151 Type 2 diabetes mellitus with diabetic peripheral angiopathy without gangrene: Secondary | ICD-10-CM

## 2021-06-19 DIAGNOSIS — I4891 Unspecified atrial fibrillation: Secondary | ICD-10-CM

## 2021-06-19 DIAGNOSIS — R110 Nausea: Secondary | ICD-10-CM

## 2021-06-19 DIAGNOSIS — J449 Chronic obstructive pulmonary disease, unspecified: Secondary | ICD-10-CM | POA: Diagnosis not present

## 2021-06-19 DIAGNOSIS — K2970 Gastritis, unspecified, without bleeding: Secondary | ICD-10-CM

## 2021-06-19 LAB — COMPREHENSIVE METABOLIC PANEL
ALT: 11 U/L (ref 0–35)
AST: 18 U/L (ref 0–37)
Albumin: 4.1 g/dL (ref 3.5–5.2)
Alkaline Phosphatase: 59 U/L (ref 39–117)
BUN: 11 mg/dL (ref 6–23)
CO2: 26 mEq/L (ref 19–32)
Calcium: 9.2 mg/dL (ref 8.4–10.5)
Chloride: 98 mEq/L (ref 96–112)
Creatinine, Ser: 0.71 mg/dL (ref 0.40–1.20)
GFR: 81.28 mL/min (ref >60.00)
Glucose, Bld: 105 mg/dL — ABNORMAL HIGH (ref 70–99)
Potassium: 4.2 mEq/L (ref 3.5–5.1)
Sodium: 131 mEq/L — ABNORMAL LOW (ref 135–145)
Total Bilirubin: 0.6 mg/dL (ref 0.2–1.2)
Total Protein: 6.9 g/dL (ref 6.0–8.3)

## 2021-06-19 LAB — CBC WITH DIFFERENTIAL/PLATELET
Basophils Absolute: 0 10*3/uL (ref 0.0–0.1)
Basophils Relative: 0.5 % (ref 0.0–3.0)
Eosinophils Absolute: 0 10*3/uL (ref 0.0–0.7)
Eosinophils Relative: 0.1 % (ref 0.0–5.0)
HCT: 35.6 % — ABNORMAL LOW (ref 36.0–46.0)
Hemoglobin: 12 g/dL (ref 12.0–15.0)
Lymphocytes Relative: 10.8 % — ABNORMAL LOW (ref 12.0–46.0)
Lymphs Abs: 0.7 10*3/uL (ref 0.7–4.0)
MCHC: 33.6 g/dL (ref 30.0–36.0)
MCV: 92.4 fl (ref 78.0–100.0)
Monocytes Absolute: 0.4 10*3/uL (ref 0.1–1.0)
Monocytes Relative: 6.4 % (ref 3.0–12.0)
Neutro Abs: 5.7 10*3/uL (ref 1.4–7.7)
Neutrophils Relative %: 82.2 % — ABNORMAL HIGH (ref 43.0–77.0)
Platelets: 268 10*3/uL (ref 150.0–400.0)
RBC: 3.85 Mil/uL — ABNORMAL LOW (ref 3.87–5.11)
RDW: 13.2 % (ref 11.5–15.5)
WBC: 6.9 10*3/uL (ref 4.0–10.5)

## 2021-06-19 LAB — HEMOGLOBIN A1C: Hgb A1c MFr Bld: 5.7 % (ref 4.6–6.5)

## 2021-06-19 LAB — TSH: TSH: 0.81 u[IU]/mL (ref 0.35–5.50)

## 2021-06-19 MED ORDER — ATORVASTATIN CALCIUM 20 MG PO TABS: 20.0000 mg | ORAL_TABLET | Freq: Every day | ORAL | 3 refills | Status: DC

## 2021-06-19 MED ORDER — ZOSTER VAC RECOMB ADJUVANTED 50 MCG/0.5ML IM SUSR: 0.5000 mL | Freq: Once | INTRAMUSCULAR | 1 refills | Status: AC

## 2021-06-19 MED ORDER — FLUTICASONE-SALMETEROL 100-50 MCG/ACT IN AEPB: 1.0000 | INHALATION_SPRAY | Freq: Two times a day (BID) | RESPIRATORY_TRACT | 2 refills | Status: DC

## 2021-06-19 MED ORDER — PANTOPRAZOLE SODIUM 40 MG PO TBEC: 40.0000 mg | DELAYED_RELEASE_TABLET | Freq: Every day | ORAL | 3 refills | Status: DC

## 2021-06-19 NOTE — Patient Instructions (Addendum)
Your nausea may be due to gastritis  I want you to start taking pantoprazole every evening 30 minutes before dinner  or at bedtime , whenever you can remember best . This is a PPI (see below)  Let me  know if your symptoms of morning nausea resolve.  Should take 2 weeks   Finish the current bottle of simvastatin and then switch to atorvastatin   I agree with restarting zoloft  (sertraline)  1/2 tablet with food in the morning for the first week. Advance to full tablet after one week   Pantoprazole Tablets What is this medication? PANTOPRAZOLE (pan TOE pra zole) treats heartburn, stomach ulcers, reflux disease, or other conditions that cause too much stomach acid. It works by reducing the amount of acid in the stomach. It belongs to a group of medications called PPIs. This medicine may be used for other purposes; ask your health care provider or pharmacist if you have questions. COMMON BRAND NAME(S): Protonix What should I tell my care team before I take this medication? They need to know if you have any of these conditions: Liver disease Low levels of calcium, magnesium, or potassium in the blood Lupus An unusual or allergic reaction to pantoprazole, other medication, foods, dyes, or preservatives Pregnant or trying to get pregnant Breast-feeding How should I use this medication? Take this medication by mouth. Swallow the tablets whole with a drink of water. Follow the directions on the prescription label. Do not crush, break, or chew. Take your medication at regular intervals. Do not take your medication more often than directed. A special MedGuide will be given to you by the pharmacist with each prescription and refill. Be sure to read this information carefully each time. Talk to your care team about the use of this medication in children. While this medication may be prescribed for children as young as 5 years for selected conditions, precautions do apply. Overdosage: If you think  you have taken too much of this medicine contact a poison control center or emergency room at once. NOTE: This medicine is only for you. Do not share this medicine with others. What if I miss a dose? If you miss a dose, take it as soon as you can. If it is almost time for your next dose, take only that dose. Do not take double or extra doses. What may interact with this medication? Do not take this medication with any of the following: Atazanavir Nelfinavir This medication may also interact with the following: Ampicillin Delavirdine Erlotinib Iron salts Medications for fungal infections like ketoconazole, itraconazole and voriconazole Methotrexate Mycophenolate mofetil Warfarin This list may not describe all possible interactions. Give your health care provider a list of all the medicines, herbs, non-prescription drugs, or dietary supplements you use. Also tell them if you smoke, drink alcohol, or use illegal drugs. Some items may interact with your medicine. What should I watch for while using this medication? It can take several days before your stomach pain gets better. Check with your care team if your condition does not start to get better, or if it gets worse. Do not treat diarrhea with over the counter products. Contact your care team if you have diarrhea that lasts more than 2 days or if it is severe and watery. You may need blood work done while you are taking this medication. Using this medication for a long time may weaken your bones. The risk of bone fractures may be increased. Talk to your care team about your  bone health. Using this medication for a long time may cause growths (polyps) in the stomach. They usually don't cause any symptoms. They are usually not cancerous. Contact your care team if you notice pain or tenderness when you press your stomach, have nausea, or see bloody or black, tar-like stools. This medication may cause a decrease in vitamin B12. You should make  sure that you get enough vitamin B12 while you are taking this medication. Discuss the foods you eat and the vitamins you take with your care team. What side effects may I notice from receiving this medication? Side effects that you should report to your care team as soon as possible: Allergic reactions-skin rash, itching, hives, swelling of the face, lips, tongue, or throat Kidney injury-decrease in the amount of urine, swelling of the ankles, hands, or feet Low magnesium level-muscle pain or cramps, unusual weakness or fatigue, fast or irregular heartbeat, tremors Low vitamin B12 level-pain, tingling, or numbness in the hands or feet, muscle weakness, dizziness, confusion, difficulty concentrating Rash on the cheeks or ams that gets worse in the sun Redness, blistering, peeling, or loosening of the skin, including inside the mouth Severe diarrhea, fever Unusual bruising or bleeding Side effects that usually do not require medical attention (report to your care team if they continue or are bothersome): Diarrhea Headache Vomiting This list may not describe all possible side effects. Call your doctor for medical advice about side effects. You may report side effects to FDA at 1-800-FDA-1088. Where should I keep my medication? Keep out of the reach of children and pets. Store at room temperature between 15 and 30 degrees C (59 and 86 degrees F). Protect from light and moisture. Throw away any unused medication after the expiration date. NOTE: This sheet is a summary. It may not cover all possible information. If you have questions about this medicine, talk to your doctor, pharmacist, or health care provider.  2022 Elsevier/Gold Standard (2020-12-24 14:06:03)

## 2021-06-19 NOTE — Progress Notes (Signed)
Subjective:  Patient ID: Jane Davidson, female    DOB: Aug 09, 1941  Age: 80 y.o. MRN: [id number]  CC: The primary encounter diagnosis was Acute gastritis without hemorrhage, unspecified gastritis type. Diagnoses of Pure hypercholesterolemia, Hypothyroidism due to acquired atrophy of thyroid, Acquired thrombophilia ([place]), Type 2 diabetes mellitus with diabetic peripheral angiopathy without gangrene, without long-term current use of insulin (HCC), Simple chronic bronchitis (HCC), Atrial fibrillation with rapid ventricular response ([place]), Chronic obstructive pulmonary disease, unspecified COPD type ([place]), Need for immunization against influenza, Nausea in adult, Essential hypertension, Aortic atherosclerosis ([place]), and Gastritis, Helicobacter pylori were also pertinent to this visit.  HPI Jane Davidson presents for follow up on hypertension , type 2 dm and aortic atherosclerosis Chief Complaint  Patient presents with   Follow-up    6 month follow up    This visit occurred during the SARS-CoV-2 public health emergency.  Safety protocols were in place, including screening questions prior to the visit, additional usage of staff PPE, and extensive cleaning of exam room while observing appropriate contact time as indicated for disinfecting solutions.   Cc: has been having early morning nausea occurring daily for the past 3-4 weeks . Starts after she gets up at 5:30 am   Does not wake up with it.  Stools are occasionally very dark but "not all the time" . takes    tylenol, no NSAIDs. Marland Kitchen 4 lbs of unintentional weight loss . Has to postpone breakfast until 8 when symptoms resolve. .  Started taking Emetrol yesterday , took a dose yesterday evening and had no nausea this morning;  thinks it helped.    Hypertension: patient checks blood pressure twice weekly at home.  Readings have been for the most part <140/80 at rest . Patient is following a reduced salt diet most days and is taking medications as  prescribed   GAD:  Sees [facility] who is retiring. She started taking zoloft took 1/2 tablet this morning    Has been having  Nocturia x 3 with urge incontinence : defers meds   Aortic atherosclerosis:  Reviewed findings of prior CT scan today..  Patient is tolerating medium potency statin therapy . Discussed trial of high potency statin   Thrombophilia:  presented to ER with Rapid atrial Fib in May ,2021  started on Eliquis and metoprolol.  Converted to SR but advised by Fath at follow up to continue both medications indefinitely.  Has  6 month follow up in November     Outpatient Medications Prior to Visit  Medication Sig Dispense Refill   amLODipine (NORVASC) 5 MG tablet TAKE 1 TABLET(5 MG) BY MOUTH DAILY 90 tablet 1   apixaban (ELIQUIS) 5 MG TABS tablet Take 1 tablet (5 mg total) by mouth 2 (two) times daily. 60 tablet 0   augmented betamethasone dipropionate (DIPROLENE-AF) 0.05 % cream   1   Cholecalciferol (VITAMIN D3) 1000 UNITS CAPS Take 1 capsule by mouth daily.     clobetasol ointment (TEMOVATE) [id number] % Apply 1 application topically 2 (two) times daily. 30 g 5   fluticasone (FLONASE) 50 MCG/ACT nasal spray SHAKE LIQUID AND USE 2 SPRAYS IN EACH NOSTRIL DAILY 16 g 2   levothyroxine (SYNTHROID) 88 MCG tablet Take 1 tablet (88 mcg total) by mouth daily. 90 tablet 3   loratadine (CLARITIN) 10 MG tablet Take 1 tablet (10 mg total) by mouth daily. 30 tablet 11   losartan (COZAAR) 100 MG tablet TAKE 1 TABLET(100 MG) BY MOUTH DAILY 90  tablet 1   nitroGLYCERIN (NITROSTAT) 0.4 MG SL tablet Place 1 tablet (0.4 mg total) under the tongue every 5 (five) minutes as needed for chest pain. 50 tablet 3   sertraline (ZOLOFT) 50 MG tablet Take 1 tablet (50 mg total) by mouth daily. 90 tablet 1   traMADol (ULTRAM) 50 MG tablet TAKE 1 TABLET(50 MG) BY MOUTH EVERY 6 HOURS AS NEEDED 120 tablet 0   triamcinolone cream (KENALOG) 0.1 % Apply 1 application topically 2 (two) times daily. 30 g 0    ADVAIR DISKUS 100-50 MCG/DOSE AEPB INHALE 1 PUFF BY MOUTH TWICE DAILY 60 each 2   simvastatin (ZOCOR) 20 MG tablet TAKE 1 TABLET(20 MG) BY MOUTH DAILY 90 tablet 1   metoprolol tartrate (LOPRESSOR) 25 MG tablet Take 1 tablet (25 mg total) by mouth 2 (two) times daily. 60 tablet 0   amLODipine (NORVASC) 2.5 MG tablet Take 2.5 mg by mouth daily. (Patient not taking: Reported on 06/19/2021)     doxycycline (VIBRA-TABS) 100 MG tablet Take 1 tablet (100 mg total) by mouth 2 (two) times daily. (Patient not taking: Reported on 06/19/2021) 20 tablet 0   No facility-administered medications prior to visit.    Review of Systems;  Patient denies headache, fevers, malaise, unintentional weight loss, skin rash, eye pain, sinus congestion and sinus pain, sore throat, dysphagia,  hemoptysis , cough, dyspnea, wheezing, chest pain, palpitations, orthopnea, edema, abdominal pain,  diarrhea, constipation, flank pain, dysuria, hematuria, urinary  Frequency, nocturia, numbness, tingling, seizures,  Focal weakness, Loss of consciousness,  Tremor, insomnia, depression, anxiety, and suicidal ideation.      Objective:  BP (!) 142/72 (BP Location: Left Arm, Patient Position: Sitting, Cuff Size: Normal)   Pulse 74   Temp (!) 96.4 F (35.8 C) (Temporal)   Ht '5\' 4"'$  (1.626 m)   Wt 141 lb (64 kg)   SpO2 97%   BMI 24.20 kg/m   BP Readings from Last 3 Encounters:  06/19/21 (!) 142/72  03/20/21 124/78  03/13/21 (!) 144/92    Wt Readings from Last 3 Encounters:  06/19/21 141 lb (64 kg)  03/20/21 145 lb 6.4 oz (66 kg)  03/13/21 146 lb (66.2 kg)    General appearance: alert, cooperative and appears stated age Ears: normal TM's and external ear canals both ears Throat: lips, mucosa, and tongue normal; teeth and gums normal Neck: no adenopathy, no carotid bruit, supple, symmetrical, trachea midline and thyroid not enlarged, symmetric, no tenderness/mass/nodules Back: symmetric, no curvature. ROM normal. No CVA  tenderness. Lungs: clear to auscultation bilaterally Heart: regular rate and rhythm, S1, S2 normal, no murmur, click, rub or gallop Abdomen: soft, non-tender; bowel sounds normal; no masses,  no organomegaly Pulses: 2+ and symmetric Skin: Skin color, texture, turgor normal. No rashes or lesions Lymph nodes: Cervical, supraclavicular, and axillary nodes normal.  Lab Results  Component Value Date   HGBA1C 5.7 06/19/2021   HGBA1C 5.8 12/07/2020   HGBA1C 5.9 05/25/2020    Lab Results  Component Value Date   CREATININE 0.71 06/19/2021   CREATININE 0.84 12/07/2020   CREATININE 0.81 07/12/2020    Lab Results  Component Value Date   WBC 6.9 06/19/2021   HGB 12.0 06/19/2021   HCT 35.6 (L) 06/19/2021   PLT 268.0 06/19/2021   GLUCOSE 105 (H) 06/19/2021   CHOL 139 06/19/2021   TRIG 63 06/19/2021   HDL 56 06/19/2021   LDLDIRECT 69.0 05/07/2017   LDLCALC 69 06/19/2021   ALT 11 06/19/2021   AST  18 06/19/2021   NA 131 (L) 06/19/2021   K 4.2 06/19/2021   CL 98 06/19/2021   CREATININE 0.71 06/19/2021   BUN 11 06/19/2021   CO2 26 06/19/2021   TSH 0.81 06/19/2021   INR 1.0 04/08/2020   HGBA1C 5.7 06/19/2021   MICROALBUR 1.5 12/07/2020    MM 3D SCREEN BREAST BILATERAL  Result Date: 03/26/2021 CLINICAL DATA:  Screening. EXAM: DIGITAL SCREENING BILATERAL MAMMOGRAM WITH TOMOSYNTHESIS AND CAD TECHNIQUE: Bilateral screening digital craniocaudal and mediolateral oblique mammograms were obtained. Bilateral screening digital breast tomosynthesis was performed. The images were evaluated with computer-aided detection. COMPARISON:  Previous exam(s). ACR Breast Density Category c: The breast tissue is heterogeneously dense, which may obscure small masses. FINDINGS: There are no findings suspicious for malignancy. The images were evaluated with computer-aided detection. IMPRESSION: No mammographic evidence of malignancy. A result letter of this screening mammogram will be mailed directly to the  patient. RECOMMENDATION: Screening mammogram in one year. (Code:SM-B-01Y) BI-RADS CATEGORY  1: Negative. Electronically Signed   By: Lajean Manes M.D.   On: 03/26/2021 13:13   Assessment & Plan:   Problem List Items Addressed This Visit       Unprioritized   COPD (chronic obstructive pulmonary disease) ([place])    She remains  asymptomatic .  Continue Advair, Flonase and claritin .  encourAged to reduce cigarette use gradually      Relevant Medications   fluticasone-salmeterol (ADVAIR DISKUS) 100-50 MCG/ACT AEPB   fluticasone-salmeterol (ADVAIR DISKUS) 100-50 MCG/ACT AEPB   Other Relevant Orders   AMB Referral to [facility]   Hyperlipidemia    Changing statin to atorvastatin       Relevant Medications   atorvastatin (LIPITOR) 20 MG tablet   Other Relevant Orders   Lipid Panel w/reflex Direct LDL (Completed)   Hypothyroidism   Relevant Orders   TSH (Completed)   Essential hypertension    Well controlled on current regimen. Renal function stable, no changes today.  Lab Results  Component Value Date   CREATININE 0.71 06/19/2021   Lab Results  Component Value Date   NA 131 (L) 06/19/2021   K 4.2 06/19/2021   CL 98 06/19/2021   CO2 26 06/19/2021         Relevant Medications   atorvastatin (LIPITOR) 20 MG tablet   Nausea in adult    ddx includes gastritis,  GI bleed .  Starting PPI,  Checking H Pylori, cbc normal  Lab Results  Component Value Date   WBC 6.9 06/19/2021   HGB 12.0 06/19/2021   HCT 35.6 (L) 06/19/2021   MCV 92.4 06/19/2021   PLT 268.0 06/19/2021        Type 2 diabetes mellitus with diabetic peripheral angiopathy without gangrene, without long-term current use of insulin ([place])     Remains well-controlled on diet alone.  Patient is up-to-date on eye exams and foot exam has been  normal  Patient has no proteinuria and is tolerating statin therapy for CAD risk reduction.    Continue use of daily aspirin  81 mg, statin , and  Adherence to  regimen of metoprolol and losartan .  Lab Results  Component Value Date   HGBA1C 5.7 06/19/2021   Lab Results  Component Value Date   MICROALBUR 1.5 12/07/2020        Relevant Medications   atorvastatin (LIPITOR) 20 MG tablet   Other Relevant Orders   Comprehensive metabolic panel (Completed)   Hemoglobin A1c (Completed)   Atrial fibrillation with  rapid ventricular response (HCC)    Resolved,  But remaining on metoprolol and eliquis per Dr Ubaldo Glassing       Relevant Medications   atorvastatin (LIPITOR) 20 MG tablet   Other Relevant Orders   AMB Referral to [place]   Acquired thrombophilia ([place])   Relevant Orders   CBC with Differential/Platelet (Completed)   Aortic atherosclerosis ([place])    Reviewed findings of prior CT scan today..  Patient is tolerating medium  potency statin therapy and is willing to try a higher potency statin       Relevant Medications   atorvastatin (LIPITOR) 20 MG tablet   Gastritis, Helicobacter pylori    Positive H pylori test in the setting recurrent daily nausea. will treat with clarithromycin 500 mg BID x 14 days,  Amoxicillin 1000 mg BID x 14 days  And pantoprazole 40 mg bid x 14 days       Relevant Medications   clarithromycin (BIAXIN) 500 MG tablet   Other Visit Diagnoses     Acute gastritis without hemorrhage, unspecified gastritis type    -  Primary   Relevant Orders   H Pylori, IGM, IGG, IGA AB (Completed)   Need for immunization against influenza       Relevant Orders   Flu Vaccine QUAD High Dose(Fluad) (Completed)      I spent 40 minutes dedicated to the care of this patient on the date of this encounter to include pre-visit review of her medical history,  Face-to-face time with the patient , and post visit ordering of testing and therapeutics.   Meds ordered this encounter  Medications   fluticasone-salmeterol (ADVAIR DISKUS) 100-50 MCG/ACT AEPB    Sig: Inhale 1 puff into the lungs 2 (two) times daily.     Dispense:  60 each    Refill:  2   pantoprazole (PROTONIX) 40 MG tablet    Sig: Take 1 tablet (40 mg total) by mouth daily.    Dispense:  30 tablet    Refill:  3   atorvastatin (LIPITOR) 20 MG tablet    Sig: Take 1 tablet (20 mg total) by mouth daily.    Dispense:  90 tablet    Refill:  3   Zoster Vaccine Adjuvanted [facility]) injection    Sig: Inject 0.5 mLs into the muscle once for 1 dose.    Dispense:  1 each    Refill:  1   fluticasone-salmeterol (ADVAIR DISKUS) 100-50 MCG/ACT AEPB    Sig: Inhale 1 puff into the lungs 2 (two) times daily.    Dispense:  60 each    Refill:  2   clarithromycin (BIAXIN) 500 MG tablet    Sig: Take 1 tablet (500 mg total) by mouth 2 (two) times daily for 14 days.    Dispense:  28 tablet    Refill:  0   amoxicillin (AMOXIL) 500 MG capsule    Sig: Take 2 capsules (1,000 mg total) by mouth 2 (two) times daily for 14 days.    Dispense:  56 capsule    Refill:  0    Medications Discontinued During This Encounter  Medication Reason   doxycycline (VIBRA-TABS) 100 MG tablet    amLODipine (NORVASC) 2.5 MG tablet    ADVAIR DISKUS 100-50 MCG/DOSE AEPB Reorder   simvastatin (ZOCOR) 20 MG tablet     Follow-up: Return in about 3 months (around 09/18/2021).   Crecencio Mc, MD

## 2021-06-20 ENCOUNTER — Telehealth: Payer: Self-pay

## 2021-06-20 DIAGNOSIS — I700 Atherosclerosis of aorta: Secondary | ICD-10-CM | POA: Insufficient documentation

## 2021-06-20 DIAGNOSIS — B9681 Helicobacter pylori [H. pylori] as the cause of diseases classified elsewhere: Secondary | ICD-10-CM | POA: Insufficient documentation

## 2021-06-20 HISTORY — DX: Helicobacter pylori (H. pylori) as the cause of diseases classified elsewhere: B96.81

## 2021-06-20 LAB — LIPID PANEL W/REFLEX DIRECT LDL
Cholesterol: 139 mg/dL (ref <200)
HDL: 56 mg/dL (ref >=50)
LDL Cholesterol (Calc): 69 mg/dL (calc)
Non-HDL Cholesterol (Calc): 83 mg/dL (calc) (ref <130)
Total CHOL/HDL Ratio: 2.5 (calc) (ref <5.0)
Triglycerides: 63 mg/dL (ref <150)

## 2021-06-20 MED ORDER — CLARITHROMYCIN 500 MG PO TABS: 500.0000 mg | ORAL_TABLET | Freq: Two times a day (BID) | ORAL | 0 refills | Status: AC

## 2021-06-20 MED ORDER — AMOXICILLIN 500 MG PO CAPS: 1000.0000 mg | ORAL_CAPSULE | Freq: Two times a day (BID) | ORAL | 0 refills | Status: AC

## 2021-06-20 NOTE — Assessment & Plan Note (Signed)
Resolved,  But remaining on metoprolol and eliquis per Dr Ubaldo Glassing

## 2021-06-20 NOTE — Assessment & Plan Note (Signed)
She remains  asymptomatic .  Continue Advair, Flonase and claritin .  encourAged to reduce cigarette use gradually

## 2021-06-20 NOTE — Assessment & Plan Note (Signed)
Changing statin to atorvastatin

## 2021-06-20 NOTE — Assessment & Plan Note (Signed)
Reviewed findings of prior CT scan today..  Patient is tolerating medium  potency statin therapy and is willing to try a higher potency statin  

## 2021-06-20 NOTE — Assessment & Plan Note (Addendum)
ddx includes gastritis,  GI bleed .  Starting PPI,  Checking H Pylori, cbc normal  Lab Results  Component Value Date   WBC 6.9 06/19/2021   HGB 12.0 06/19/2021   HCT 35.6 (L) 06/19/2021   MCV 92.4 06/19/2021   PLT 268.0 06/19/2021

## 2021-06-20 NOTE — Chronic Care Management (AMB) (Signed)
  Chronic Care Management   Note  06/20/2021 Name: Jane Davidson MRN: [id number] DOB: 1941/05/03  Jane Davidson is a 80 y.o. year old female who is a primary care patient of Derrel Nip, Aris Everts, MD. I reached out to [name] by phone today in response to a referral sent by [name] PCP, Crecencio Mc, MD      [name] was given information about Chronic Care Management services today including:  CCM service includes personalized support from designated clinical staff supervised by her physician, including individualized plan of care and coordination with other care providers 24/7 contact phone numbers for assistance for urgent and routine care needs. Service will only be billed when office clinical staff spend 20 minutes or more in a month to coordinate care. Only one practitioner may furnish and bill the service in a calendar month. The patient may stop CCM services at any time (effective at the end of the month) by phone call to the office staff. The patient will be responsible for cost sharing (co-pay) of up to 20% of the service fee (after annual deductible is met).  Patient agreed to services and verbal consent obtained.   Follow up plan: Telephone appointment with care management team member scheduled for:07/02/2021  Noreene Larsson, [place], [place], [place] [address] Direct Dial: [phone] [email or web address]

## 2021-06-20 NOTE — Assessment & Plan Note (Signed)
Remains well-controlled on diet alone.  Patient is up-to-date on eye exams and foot exam has been  normal  Patient has no proteinuria and is tolerating statin therapy for CAD risk reduction.    Continue use of daily aspirin  81 mg, statin , and  Adherence to regimen of metoprolol and losartan .  Lab Results  Component Value Date   HGBA1C 5.7 06/19/2021   Lab Results  Component Value Date   MICROALBUR 1.5 12/07/2020

## 2021-06-20 NOTE — Assessment & Plan Note (Signed)
Positive H pylori test in the setting recurrent daily nausea. will treat with clarithromycin 500 mg BID x 14 days,  Amoxicillin 1000 mg BID x 14 days  And pantoprazole 40 mg bid x 14 days

## 2021-06-20 NOTE — Progress Notes (Signed)
Your CBC, thyroid ,cholesterol, a1c  and kidney function are normal.  However,  One of your labs suggest that the nausea  you have been experiencing is due to  gastritis caused by an infection with a bacteria called H Pylori.  This infection has to be treated with high doses of 2 different antibiotics for a period of two weeks, along with a twice daily dose of the medication called pantoprazole  (40 mg dose) that you were prescribed yesterday.  I am sending the antibiotics to your pharmacy..  please follow the directions on the bottles,  and increase the pantoprazole to 2 times daily  .  Regards,   [name], MD

## 2021-06-20 NOTE — Assessment & Plan Note (Signed)
Well controlled on current regimen. Renal function stable, no changes today.  Lab Results  Component Value Date   CREATININE 0.71 06/19/2021   Lab Results  Component Value Date   NA 131 (L) 06/19/2021   K 4.2 06/19/2021   CL 98 06/19/2021   CO2 26 06/19/2021

## 2021-06-21 LAB — H PYLORI, IGM, IGG, IGA AB
H pylori, IgM Abs: <9 units (ref 0.0–8.9)
H. pylori, IgA Abs: 21.6 units — ABNORMAL HIGH (ref 0.0–8.9)
H. pylori, IgG AbS: 5.75 Index Value — ABNORMAL HIGH (ref 0.00–0.79)

## 2021-06-24 ENCOUNTER — Other Ambulatory Visit: Payer: Self-pay | Admitting: Internal Medicine

## 2021-06-26 DIAGNOSIS — F333 Major depressive disorder, recurrent, severe with psychotic symptoms: Secondary | ICD-10-CM | POA: Diagnosis not present

## 2021-07-02 ENCOUNTER — Ambulatory Visit (INDEPENDENT_AMBULATORY_CARE_PROVIDER_SITE_OTHER): Payer: PPO | Admitting: Pharmacist

## 2021-07-02 DIAGNOSIS — E1151 Type 2 diabetes mellitus with diabetic peripheral angiopathy without gangrene: Secondary | ICD-10-CM

## 2021-07-02 DIAGNOSIS — E7800 Pure hypercholesterolemia, unspecified: Secondary | ICD-10-CM

## 2021-07-02 DIAGNOSIS — I4891 Unspecified atrial fibrillation: Secondary | ICD-10-CM

## 2021-07-02 DIAGNOSIS — E034 Atrophy of thyroid (acquired): Secondary | ICD-10-CM

## 2021-07-02 DIAGNOSIS — J449 Chronic obstructive pulmonary disease, unspecified: Secondary | ICD-10-CM

## 2021-07-02 DIAGNOSIS — I700 Atherosclerosis of aorta: Secondary | ICD-10-CM

## 2021-07-02 MED ORDER — FLUTICASONE-SALMETEROL 100-50 MCG/ACT IN AEPB: 1.0000 | INHALATION_SPRAY | Freq: Two times a day (BID) | RESPIRATORY_TRACT | 3 refills | Status: DC

## 2021-07-02 NOTE — Chronic Care Management (AMB) (Signed)
Chronic Care Management Pharmacy Note  07/02/2021 Name:  Jane Davidson MRN:  [id number] DOB:  08/19/1941  Subjective: Jane Davidson is an 80 y.o. year old female who is a primary patient of Derrel Nip, Aris Everts, MD.  The CCM team was consulted for assistance with disease management and care coordination needs.    Engaged with patient by telephone for initial visit in response to provider referral for pharmacy case management and/or care coordination services.   Consent to Services:  The patient was given information about Chronic Care Management services, agreed to services, and gave verbal consent prior to initiation of services.  Please see initial visit note for detailed documentation.   Patient Care Team: Crecencio Mc, MD as PCP - General (Internal Medicine) De Hollingshead, RPH-CPP (Pharmacist)    Objective:  Lab Results  Component Value Date   CREATININE 0.71 06/19/2021   CREATININE 0.84 12/07/2020   CREATININE 0.81 07/12/2020    Lab Results  Component Value Date   HGBA1C 5.7 06/19/2021   Last diabetic Eye exam:  Lab Results  Component Value Date/Time   HMDIABEYEEXA No Retinopathy 09/27/2020 12:00 AM    Last diabetic Foot exam:  Lab Results  Component Value Date/Time   HMDIABFOOTEX normal 12/12/2014 12:00 AM        Component Value Date/Time   CHOL 139 06/19/2021 1107   TRIG 63 06/19/2021 1107   HDL 56 06/19/2021 1107   CHOLHDL 2.5 06/19/2021 1107   VLDL 12.0 12/07/2020 0802   LDLCALC 69 06/19/2021 1107   LDLDIRECT 69.0 05/07/2017 0828    Hepatic Function Latest Ref Rng & Units 06/19/2021 12/07/2020 05/25/2020  Total Protein 6.0 - 8.3 g/dL 6.9 6.9 6.6  Albumin 3.5 - 5.2 g/dL 4.1 3.9 4.0  AST 0 - 37 U/L 18 16 16   ALT 0 - 35 U/L 11 10 9   Alk Phosphatase 39 - 117 U/L 59 57 63  Total Bilirubin 0.2 - 1.2 mg/dL 0.6 0.4 0.4  Bilirubin, Direct 0.0 - 0.3 mg/dL - - -    Lab Results  Component Value Date/Time   TSH 0.81 06/19/2021 11:07 AM   TSH  2.07 12/07/2020 08:02 AM   FREET4 1.24 (H) 04/07/2020 03:22 PM    CBC Latest Ref Rng & Units 06/19/2021 05/25/2020 04/08/2020  WBC 4.0 - 10.5 K/uL 6.9 7.6 8.3  Hemoglobin 12.0 - 15.0 g/dL 12.0 12.2 12.3  Hematocrit 36.0 - 46.0 % 35.6(L) 35.9(L) 34.8(L)  Platelets 150.0 - 400.0 K/uL 268.0 248.0 226    Lab Results  Component Value Date/Time   VD25OH 20.42 (L) 10/02/2016 08:14 AM   VD25OH 42.34 02/18/2016 10:12 AM    Clinical ASCVD: Yes  The 10-year ASCVD risk score (Arnett DK, et al., 2019) is: 66.9%   Values used to calculate the score:     Age: 68 years     Sex: Female     Is Non-Hispanic African American: No     Diabetic: Yes     Tobacco smoker: Yes     Systolic Blood Pressure: 580 mmHg     Is BP treated: Yes     HDL Cholesterol: 56 mg/dL     Total Cholesterol: 139 mg/dL     CHA2DS2-VASc Score = 5  This indicates a 7.2% annual risk of stroke. The patient's score is based upon: CHF History: 0 HTN History: 1 Diabetes History: 0 Stroke History: 0 Vascular Disease History: 1 Age Score: 2 Gender Score: 1    Social  History   Tobacco Use  Smoking Status Every Day   Packs/day: 1.04   Years: 40.00   Pack years: 41.60   Types: Cigarettes  Smokeless Tobacco Never   BP Readings from Last 3 Encounters:  06/19/21 (!) 142/72  03/20/21 124/78  03/13/21 (!) 144/92   Pulse Readings from Last 3 Encounters:  06/19/21 74  03/20/21 68  03/13/21 80   Wt Readings from Last 3 Encounters:  06/19/21 141 lb (64 kg)  03/20/21 145 lb 6.4 oz (66 kg)  03/13/21 146 lb (66.2 kg)    Assessment: Review of patient past medical history, allergies, medications, health status, including review of consultants reports, laboratory and other test data, was performed as part of comprehensive evaluation and provision of chronic care management services.   SDOH:  (Social Determinants of Health) assessments and interventions performed:  SDOH Interventions    Flowsheet Row Most Recent Value   SDOH Interventions   SDOH Interventions for the Following Domains Tobacco  Financial Strain Interventions Other (Comment)  [manufacturer assistance]  Tobacco Interventions Cessation Materials Given and Reviewed       CCM Care Plan  Allergies  Allergen Reactions   Pollen Extract    Hctz [Hydrochlorothiazide]     Hyponatremia    Other Cough    Other reaction(s): Other (See Comments) Running nose    Medications Reviewed Today     Reviewed by De Hollingshead, RPH-CPP (Pharmacist) on 07/02/21 at 1400  Med List Status: <None>   Medication Order Taking? Sig Documenting Provider Last Dose Status Informant  amLODipine (NORVASC) 5 MG tablet [id number] Yes TAKE 1 TABLET(5 MG) BY MOUTH DAILY Crecencio Mc, MD Taking Active   amoxicillin (AMOXIL) 500 MG capsule [id number] Yes Take 2 capsules (1,000 mg total) by mouth 2 (two) times daily for 14 days. Crecencio Mc, MD Taking Active   apixaban (ELIQUIS) 5 MG TABS tablet [id number] Yes Take 1 tablet (5 mg total) by mouth 2 (two) times daily. Barb Merino, MD Taking Active   atorvastatin (LIPITOR) 20 MG tablet [id number] No Take 1 tablet (20 mg total) by mouth daily.  Patient not taking: Reported on 07/02/2021   Crecencio Mc, MD Not Taking Active            Med Note Darnelle Maffucci, [place] Jul 02, 2021  1:53 PM) Finishing simvastatin supply first, then will start atorvastatin  Cholecalciferol (VITAMIN D3) 1000 UNITS CAPS [id number] Yes Take 1 capsule by mouth daily. [provider] Taking Active Self  clarithromycin (BIAXIN) 500 MG tablet [id number] Yes Take 1 tablet (500 mg total) by mouth 2 (two) times daily for 14 days. Crecencio Mc, MD Taking Active   clobetasol ointment (TEMOVATE) 0.05 % [id number] Yes Apply 1 application topically 2 (two) times daily. Crecencio Mc, MD Taking Active Self  fluticasone Asencion Islam) 50 MCG/ACT nasal spray [id number] Yes SHAKE LIQUID AND USE 2 SPRAYS IN EACH NOSTRIL DAILY Crecencio Mc, MD  Taking Active   fluticasone-salmeterol (ADVAIR DISKUS) 100-50 MCG/ACT AEPB [id number] No Inhale 1 puff into the lungs 2 (two) times daily.  Patient not taking: Reported on 07/02/2021   Crecencio Mc, MD Not Taking Active   levothyroxine (SYNTHROID) 88 MCG tablet [id number] Yes TAKE 1 TABLET(88 MCG) BY MOUTH DAILY Crecencio Mc, MD Taking Active   loratadine (CLARITIN) 10 MG tablet [id number] Yes Take 1 tablet (10 mg total) by mouth daily. Ria Bush, MD Taking Active Self  losartan (COZAAR) 100 MG tablet  [id number] Yes TAKE 1 TABLET(100 MG) BY MOUTH DAILY Crecencio Mc, MD Taking Active   metoprolol tartrate (LOPRESSOR) 25 MG tablet [id number] Yes Take 1 tablet (25 mg total) by mouth 2 (two) times daily. Barb Merino, MD Taking Active   nitroGLYCERIN (NITROSTAT) 0.4 MG SL tablet [id number] No Place 1 tablet (0.4 mg total) under the tongue every 5 (five) minutes as needed for chest pain.  Patient not taking: Reported on 07/02/2021   Crecencio Mc, MD Not Taking Active   pantoprazole (PROTONIX) 40 MG tablet [id number] Yes Take 1 tablet (40 mg total) by mouth daily. Crecencio Mc, MD Taking Active            Med Note Darnelle Maffucci, [place] Jul 02, 2021  1:56 PM) Taking twice daily  sertraline (ZOLOFT) 50 MG tablet [id number] Yes Take 1 tablet (50 mg total) by mouth daily. Crecencio Mc, MD Taking Active Self           Med Note Lula Olszewski Jul 02, 2021  1:54 PM)    Patient not taking:  Discontinued 07/02/21 1400 (Completed Course)   triamcinolone cream (KENALOG) 0.1 % [id number] Yes Apply 1 application topically 2 (two) times daily. Doreen Beam, FNP Taking Active             Patient Active Problem List   Diagnosis Date Noted   Aortic atherosclerosis ([place]) 42/68/3419   Gastritis, Helicobacter pylori 62/22/9798   Acquired thrombophilia ([place]) 04/15/2020   Atrial fibrillation with rapid ventricular response ([place]) 04/07/2020   Degeneration of cervical  intervertebral disc 05/28/2019   Type 2 diabetes mellitus with diabetic peripheral angiopathy without gangrene, without long-term current use of insulin ([place]) 05/28/2019   Chest pain at rest 08/15/2018   Hyponatremia 06/11/2018   Osteoarthritis of spine 03/01/2018   Chronic low back pain without sciatica 03/01/2018   Spinal stenosis of lumbosacral region 03/01/2018   Lumbar herniated disc 03/01/2018   Cardiac murmur 03/01/2018   Nausea in adult 03/01/2018   Lumbar radiculopathy 06/04/2017   Atopic dermatitis 04/22/2017   Carotid stenosis 08/29/2016   Personal history of tobacco use, presenting hazards to health 06/18/2016   Essential hypertension 02/29/2016   Chronic left hip pain 04/01/2015   Constipation 06/08/2014   Encounter for preventive health examination 03/27/2013   Screening for colon cancer 10/16/2011   Tobacco abuse counseling 08/09/2011   History of peripheral vascular disease    Osteopenia    Tobacco abuse disorder    Vitamin D deficiency    Arrhythmia 07/05/2011   Hypothyroidism 07/05/2011   Varicose veins of left lower extremity with pain 07/05/2011   COPD (chronic obstructive pulmonary disease) ([place])    Hyperlipidemia    Screening for breast cancer 02/11/2011    Immunization History  Administered Date(s) Administered   Fluad Quad(high Dose 65+) 06/17/2019, 10/18/2020, 06/19/2021   Influenza Split 07/05/2012, 07/27/2013, 06/29/2014   Influenza, High Dose Seasonal PF 06/10/2017, 06/15/2018   Influenza,inj,Quad PF,6+ Mos 06/04/2016   Influenza-Unspecified 07/20/2015   PFIZER(Purple Top)SARS-COV-2 Vaccination 11/29/2019, 12/20/2019, 08/09/2020   Pneumococcal Conjugate-13 04/06/2014   Pneumococcal Polysaccharide-23 07/05/2012, 11/18/2017   Tdap 10/16/2011   Zoster, Live 12/19/2011    Conditions to be addressed/monitored: Atrial Fibrillation, HTN, HLD, and COPD  Care Plan : Medication Management  Updates made by De Hollingshead, RPH-CPP since 07/02/2021  12:00 AM     Problem: COPD, Atrial Fibrillation      Long-Range Goal: Disease Progression Prevention  Start Date: 07/02/2021  This Visit's Progress: On track  Priority: High  Note:   Current Barriers:  Unable to independently afford treatment regimen  Pharmacist Clinical Goal(s):  Over the next 90 days, patient will verbalize ability to afford treatment regimen through collaboration with PharmD and provider.   Interventions: 1:1 collaboration with Crecencio Mc, MD regarding development and update of comprehensive plan of care as evidenced by provider attestation and co-signature Inter-disciplinary care team collaboration (see longitudinal plan of care) Comprehensive medication review performed; medication list updated in electronic medical record  SDOH: Reports she is in the Medicare Coverage Gap  Medication Management: Uses pill box. Discussed that she can take all AM medications together (except levothyroxine) and all PM medications together.   Atrial Fibrillation: Appropriately managed, follows w/ Dr. Ubaldo Glassing; current treatment:  Rate control: metoprolol tartrate 25 mg BID  Anticoagulant treatment: Eliquis 5 mg BID Additional antihypertensives: amlodipine 5 mg daily, losartan 100 mg daily Discussed income and total out of pocket spend. Patient would qualify for Eliquis assistance. Will collaborate w/ CPhT and provider to complete patient assistance application  Hyperlipidemia: Uncontrolled; current treatment: simvastatin 20 mg - completing supply before starting atorvastatin 20 mg Reviewed her instruction from PCP to complete simvastatin supply then fill prescription for to atorvastatin 20 mg daily.   Chronic Obstructive Pulmonary Disease: Controlled; current treatment: Advair 100/50 mcg 1 puff BID - though taking PRN, denies frequent symptoms Most recent Pulmonary Function Testing: none Current smoker, currently smoking ~ 1 pack per day Previous quit attempts:  unsuccessful using hypnosis. No history of nicotine replacement  Triggers to smoke: stress Motivational interviewing provided. Discussed dual nicotine replacement therapy (with lozenge as patient reports fake teeth) vs varenicline along with motivational support. Discussed availability of nicotine patch supply from [place] [facility] and OTC program from [employer]. Varenicline would be Tier 3 on her insurance, so expensive right now as she is in Coverage Gap. Encouraged to continue to contemplate and discuss with me at future appointments  Hypothyroidism: Controlled per last lab work, current regimen: levothyroxine 88 mcg daily Confirmed appropriate administration Recommended to continue current regimen at this time  Depression/Anxiety: Moderately well controlled per her last PCP visit; current treatment: sertraline 50 mg daily;  Recommended to continue current regimen at this time  GERD/H Pylori: Completing treatment; current regimen: amoxicillin 1 g BID x 14 days, clarithromycin 500 mg BID x 14 days, pantoprazole 40 mg BID x 14 days, reports she has ~ 2 days remaining. Reports less gas, but still some upset stomach Reviewed to complete current regimen as prescribed.   Patient Goals/Self-Care Activities Over the next 90 days, patient will:  - take medications as prescribed collaborate with provider on medication access solutions  Follow Up Plan: Telephone follow up appointment with care management team member scheduled for: ~ 8 weeks      Medication Assistance: Application for Eliquis, Advair  medication assistance program. in process.  Anticipated assistance start date TBD.  See plan of care for additional detail.  Patient's preferred pharmacy is:  [id number] [address], [place] [address] Phone: [phone] Fax: [phone]   Follow Up:  Patient agrees to Care Plan and  Follow-up.  Plan: Telephone follow up appointment with care management team member scheduled for:  ~ 8 weeks  [name], PharmD, [place], [place] Clinical Pharmacist [employer] at [employer] [phone]

## 2021-07-02 NOTE — Patient Instructions (Addendum)
Jane Davidson,   It was great talking with you today!  You can call 1-800-QUIT-NOW to reach the [place] Quitline. They can help supply nicotine patches to help you quit smoking.   My pharmacy technician will mail you the pages of the applications for Eliquis and Advair. Please ask [facility] for a print out showing your total out of pocket spend on prescription copays this calendar year.   Call us with any questions or concerns!  Jane Davidson, PharmD [id number]  Visit Information   PATIENT GOALS:   Goals Addressed               This Visit's Progress     Patient Stated     Medication Monitoring (pt-stated)        Patient Goals/Self-Care Activities Over the next 90 days, patient will:  - take medications as prescribed collaborate with provider on medication access solutions        Consent to CCM Services: Jane Davidson was given information about Chronic Care Management services including:  CCM service includes personalized support from designated clinical staff supervised by her physician, including individualized plan of care and coordination with other care providers 24/7 contact phone numbers for assistance for urgent and routine care needs. Service will only be billed when office clinical staff spend 20 minutes or more in a month to coordinate care. Only one practitioner may furnish and bill the service in a calendar month. The patient may stop CCM services at any time (effective at the end of the month) by phone call to the office staff. The patient will be responsible for cost sharing (co-pay) of up to 20% of the service fee (after annual deductible is met).  Patient agreed to services and verbal consent obtained.   The patient verbalized understanding of instructions, educational materials, and care plan provided today and agreed to receive a mailed copy of patient instructions, educational materials, and care plan.   Plan: Telephone follow up appointment with care  management team member scheduled for:  ~ 8 weeks   Jane Davidson, PharmD, [place], CPP Clinical Pharmacist [place] [facility]: Patient Care Plan: Medication Management     Problem Identified: COPD, Atrial Fibrillation      Long-Range Goal: Disease Progression Prevention   Start Date: 07/02/2021  This Visit's Progress: On track  Priority: High  Note:   Current Barriers:  Unable to independently afford treatment regimen  Pharmacist Clinical Goal(s):  Over the next 90 days, patient will verbalize ability to afford treatment regimen through collaboration with PharmD and provider.   Interventions: 1:1 collaboration with Crecencio Mc, MD regarding development and update of comprehensive plan of care as evidenced by provider attestation and co-signature Inter-disciplinary care team collaboration (see longitudinal plan of care) Comprehensive medication review performed; medication list updated in electronic medical record  SDOH: Reports she is in the Medicare Coverage Gap  Medication Management: Uses pill box. Discussed that she can take all AM medications together (except levothyroxine) and all PM medications together.   Atrial Fibrillation: Appropriately managed, follows w/ Dr. Ubaldo Glassing; current treatment:  Rate control: metoprolol tartrate 25 mg BID  Anticoagulant treatment: Eliquis 5 mg BID Additional antihypertensives: amlodipine 5 mg daily, losartan 100 mg daily Discussed income and total out of pocket spend. Patient would qualify for Eliquis assistance. Will collaborate w/ CPhT and provider to complete patient assistance application  Hyperlipidemia: Uncontrolled; current treatment: simvastatin 20 mg - completing supply before starting atorvastatin 20 mg  Reviewed her instruction from PCP to complete simvastatin supply then fill prescription for to atorvastatin 20 mg daily.   Chronic Obstructive Pulmonary  Disease: Controlled; current treatment: Advair 100/50 mcg 1 puff BID - though taking PRN, denies frequent symptoms Most recent Pulmonary Function Testing: none Current smoker, currently smoking ~ 1 pack per day Previous quit attempts: unsuccessful using hypnosis. No history of nicotine replacement  Triggers to smoke: stress Motivational interviewing provided. Discussed dual nicotine replacement therapy (with lozenge as patient reports fake teeth) vs varenicline along with motivational support. Discussed availability of nicotine patch supply from [place] [facility]uit[facility] and OTC program from [employer]. Varenicline would be Tier 3 on her insurance, so expensive right now as she is in Coverage Gap. Encouraged to continue to contemplate and discuss with me at future appointments  Hypothyroidism: Controlled per last lab work, current regimen: levothyroxine 88 mcg daily Confirmed appropriate administration Recommended to continue current regimen at this time  Depression/Anxiety: Moderately well controlled per her last PCP visit; current treatment: sertraline 50 mg daily;  Recommended to continue current regimen at this time  GERD/H Pylori: Completing treatment; current regimen: amoxicillin 1 g BID x 14 days, clarithromycin 500 mg BID x 14 days, pantoprazole 40 mg BID x 14 days, reports she has ~ 2 days remaining. Reports less gas, but still some upset stomach Reviewed to complete current regimen as prescribed.   Patient Goals/Self-Care Activities Over the next 90 days, patient will:  - take medications as prescribed collaborate with provider on medication access solutions  Follow Up Plan: Telephone follow up appointment with care management team member scheduled for: ~ 8 weeks

## 2021-07-09 ENCOUNTER — Telehealth: Payer: Self-pay | Admitting: Pharmacy Technician

## 2021-07-09 DIAGNOSIS — Z596 Low income: Secondary | ICD-10-CM

## 2021-07-09 NOTE — Progress Notes (Signed)
[facility])                                            [place] Team    07/09/2021  ZADIE DEEMER [birth year] [id number]                                      Medication Assistance Referral  Referral From: [place] RPh Catie T.   Medication/Company: Advair / [place] Patient application portion:  Mailed Provider application portion:  N/A Embedded PharmD had signed in clinic  to Dr. [name] Provider address/fax verified via: Office website  Medication/Company: Eliquis / [employer] Patient application portion:  Mailed Provider application portion:  N/A Embedded PharmD had signed in clinic  to Dr. [name] Provider address/fax verified via: Office website    Viktor Philipp P. Shelba Susi, [place]  [phone]

## 2021-07-12 DIAGNOSIS — E034 Atrophy of thyroid (acquired): Secondary | ICD-10-CM | POA: Diagnosis not present

## 2021-07-12 DIAGNOSIS — E7800 Pure hypercholesterolemia, unspecified: Secondary | ICD-10-CM

## 2021-07-12 DIAGNOSIS — I4891 Unspecified atrial fibrillation: Secondary | ICD-10-CM | POA: Diagnosis not present

## 2021-07-12 DIAGNOSIS — E1151 Type 2 diabetes mellitus with diabetic peripheral angiopathy without gangrene: Secondary | ICD-10-CM | POA: Diagnosis not present

## 2021-07-12 DIAGNOSIS — J449 Chronic obstructive pulmonary disease, unspecified: Secondary | ICD-10-CM

## 2021-07-18 ENCOUNTER — Other Ambulatory Visit: Payer: Self-pay | Admitting: Internal Medicine

## 2021-07-18 DIAGNOSIS — F325 Major depressive disorder, single episode, in full remission: Secondary | ICD-10-CM | POA: Insufficient documentation

## 2021-07-18 MED ORDER — SERTRALINE HCL 50 MG PO TABS: 50.0000 mg | ORAL_TABLET | Freq: Every day | ORAL | 1 refills | Status: DC

## 2021-07-18 NOTE — Assessment & Plan Note (Signed)
Managed by Dr Lanetta Inch who is retiring Aug 13 2021 .  zoloft refills sent to [facility]

## 2021-07-25 ENCOUNTER — Telehealth: Payer: Self-pay | Admitting: Pharmacy Technician

## 2021-07-25 DIAGNOSIS — Z596 Low income: Secondary | ICD-10-CM

## 2021-07-25 NOTE — Progress Notes (Signed)
[facility])                                            [place] Team    07/25/2021  NORELLE RUNNION 07/26/1941 [id number]  Received both patient and provider portion(s) of patient assistance application(s) for Eliquis and Advair. Faxed completed application and required documents into [employer] and [employer] respectively.   Eiliyah Reh P. Tattiana Fakhouri, [place]  [phone]

## 2021-07-29 ENCOUNTER — Telehealth: Payer: Self-pay | Admitting: Pharmacy Technician

## 2021-07-29 DIAGNOSIS — Z596 Low income: Secondary | ICD-10-CM

## 2021-07-29 NOTE — Progress Notes (Signed)
[facility])                                            [place] Team    07/29/2021  Jane Davidson July 05, 1941 [id number]  Care coordination call placed to [place] in regards to Advair [place] application.  Spoke to [place] who informed patient was APPROVED 07/26/21-10/12/21. She informed medication should arrive to patient's home in the next 7-14 business days.  Care coordination call placed to [employer] in regards to Eliquis application. Spoke to [place] who informed application was still processing.  Jane Davidson, [place]  [phone]

## 2021-08-02 ENCOUNTER — Telehealth: Payer: Self-pay | Admitting: Pharmacy Technician

## 2021-08-02 NOTE — Progress Notes (Signed)
[facility])                                            [place] Team    08/02/2021  ALABAMA DOIG 16-Apr-1941 [id number]  Care coordination cal placed to [employer] in regards to Eliquis application.  Spoke to [place] who informed patient was APPROVED 07/29/21-10/12/21. She informed medication was delivered to patient's home on 08/01/21.  Elizabth Palka P. Deontra Pereyra, [place]  [phone]

## 2021-08-26 DIAGNOSIS — I10 Essential (primary) hypertension: Secondary | ICD-10-CM | POA: Diagnosis not present

## 2021-08-26 DIAGNOSIS — I4891 Unspecified atrial fibrillation: Secondary | ICD-10-CM | POA: Diagnosis not present

## 2021-08-26 DIAGNOSIS — R011 Cardiac murmur, unspecified: Secondary | ICD-10-CM | POA: Diagnosis not present

## 2021-08-26 DIAGNOSIS — J449 Chronic obstructive pulmonary disease, unspecified: Secondary | ICD-10-CM | POA: Diagnosis not present

## 2021-08-26 DIAGNOSIS — R079 Chest pain, unspecified: Secondary | ICD-10-CM | POA: Diagnosis not present

## 2021-08-28 ENCOUNTER — Ambulatory Visit (INDEPENDENT_AMBULATORY_CARE_PROVIDER_SITE_OTHER): Payer: PPO | Admitting: Pharmacist

## 2021-08-28 DIAGNOSIS — Z720 Tobacco use: Secondary | ICD-10-CM

## 2021-08-28 DIAGNOSIS — J410 Simple chronic bronchitis: Secondary | ICD-10-CM

## 2021-08-28 DIAGNOSIS — I4891 Unspecified atrial fibrillation: Secondary | ICD-10-CM

## 2021-08-28 DIAGNOSIS — E7800 Pure hypercholesterolemia, unspecified: Secondary | ICD-10-CM

## 2021-08-28 MED ORDER — ATORVASTATIN CALCIUM 20 MG PO TABS: 20.0000 mg | ORAL_TABLET | Freq: Every day | ORAL | 1 refills | Status: DC

## 2021-08-28 NOTE — Patient Instructions (Signed)
Visit Information  Schedule your second Shingrix vaccine between December 19th and April 19th.   Take care!  Patient verbalizes understanding of instructions provided today and agrees to view in [place].   Patient Goals/Self-Care Activities Over the next 90 days, patient will:  - take medications as prescribed collaborate with provider on medication access solutions        Plan: Telephone follow up appointment with care management team member scheduled for:  8 weeks   [name], PharmD, [place], [place] Pharmacist [employer] at [employer] [phone]

## 2021-08-28 NOTE — Chronic Care Management (AMB) (Addendum)
Chronic Care Management CCM Pharmacy Note  08/28/2021 Name:  Jane Davidson MRN:  [id number] DOB:  [birth year]  Summary: - Approved for Eliquis and Advair assistance through 2022.  Recommendations/Changes made from today's visit: - Recommended to stop simvastatin and start atorvastatin per prior recommendation from PCP.  - Agree to discuss tobacco cessation therapy in January  Subjective: Jane Davidson is an 80 y.o. year old female who is a primary patient of Tullo, Aris Everts, MD.  The CCM team was consulted for assistance with disease management and care coordination needs.    Engaged with patient by telephone for follow up visit for pharmacy case management and/or care coordination services.   Objective:  Medications Reviewed Today     Reviewed by De Hollingshead, RPH-CPP (Pharmacist) on 08/28/21 at 27  Med List Status: <None>   Medication Order Taking? Sig Documenting Provider Last Dose Status Informant  amLODipine (NORVASC) 5 MG tablet [id number] Yes TAKE 1 TABLET(5 MG) BY MOUTH DAILY Crecencio Mc, MD Taking Active   apixaban (ELIQUIS) 5 MG TABS tablet [id number] Yes Take 1 tablet (5 mg total) by mouth 2 (two) times daily. Barb Merino, MD Taking Active   atorvastatin (LIPITOR) 20 MG tablet [id number]  Take 1 tablet (20 mg total) by mouth daily. Crecencio Mc, MD  Active   Cholecalciferol (VITAMIN D3) 1000 UNITS CAPS [id number] Yes Take 1 capsule by mouth daily. [provider] Taking Active Self  clobetasol ointment (TEMOVATE) 0.05 % [id number]  Apply 1 application topically 2 (two) times daily. Crecencio Mc, MD  Active Self  fluticasone Asencion Islam) 50 MCG/ACT nasal spray [id number] Yes SHAKE LIQUID AND USE 2 SPRAYS IN EACH NOSTRIL DAILY Crecencio Mc, MD Taking Active   fluticasone-salmeterol (ADVAIR DISKUS) 100-50 MCG/ACT AEPB [id number] Yes Inhale 1 puff into the lungs 2 (two) times daily. Crecencio Mc, MD Taking Active   levothyroxine (SYNTHROID) 88  MCG tablet [id number] Yes TAKE 1 TABLET(88 MCG) BY MOUTH DAILY Crecencio Mc, MD Taking Active   loratadine (CLARITIN) 10 MG tablet [id number] Yes Take 1 tablet (10 mg total) by mouth daily. Ria Bush, MD Taking Active Self  losartan (COZAAR) 100 MG tablet [id number] Yes TAKE 1 TABLET(100 MG) BY MOUTH DAILY Crecencio Mc, MD Taking Active   metoprolol tartrate (LOPRESSOR) 25 MG tablet [id number] Yes Take 1 tablet (25 mg total) by mouth 2 (two) times daily. Barb Merino, MD Taking Active   nitroGLYCERIN (NITROSTAT) 0.4 MG SL tablet [id number]  Place 1 tablet (0.4 mg total) under the tongue every 5 (five) minutes as needed for chest pain.  Patient not taking: Reported on 07/02/2021   Crecencio Mc, MD  Active   sertraline (ZOLOFT) 50 MG tablet [id number] Yes Take 1 tablet (50 mg total) by mouth daily. Crecencio Mc, MD Taking Active   triamcinolone cream (KENALOG) 0.1 % [id number]  Apply 1 application topically 2 (two) times daily. Doreen Beam, FNP  Active             Pertinent Labs:   Lab Results  Component Value Date   HGBA1C 5.7 06/19/2021   Lab Results  Component Value Date   CHOL 139 06/19/2021   HDL 56 06/19/2021   LDLCALC 69 06/19/2021   LDLDIRECT 69.0 05/07/2017   TRIG 63 06/19/2021   CHOLHDL 2.5 06/19/2021   Lab Results  Component Value Date   CREATININE 0.71 06/19/2021   BUN 11 06/19/2021   NA 131 (L) 06/19/2021  K 4.2 06/19/2021   CL 98 06/19/2021   CO2 26 06/19/2021    SDOH:  (Social Determinants of Health) assessments and interventions performed:  SDOH Interventions    Flowsheet Row Most Recent Value  SDOH Interventions   Financial Strain Interventions Other (Comment)  [manufacturer assistance]       CCM Care Plan  Review of patient past medical history, allergies, medications, health status, including review of consultants reports, laboratory and other test data, was performed as part of comprehensive evaluation and provision of  chronic care management services.   Care Plan : Medication Management  Updates made by De Hollingshead, RPH-CPP since 08/28/2021 12:00 AM     Problem: COPD, Atrial Fibrillation      Long-Range Goal: Disease Progression Prevention   Start Date: 07/02/2021  This Visit's Progress: On track  Recent Progress: On track  Priority: High  Note:   Current Barriers:  Unable to independently afford treatment regimen  Pharmacist Clinical Goal(s):  Over the next 90 days, patient will verbalize ability to afford treatment regimen through collaboration with PharmD and provider.   Interventions: 1:1 collaboration with Crecencio Mc, MD regarding development and update of comprehensive plan of care as evidenced by provider attestation and co-signature Inter-disciplinary care team collaboration (see longitudinal plan of care) Comprehensive medication review performed; medication list updated in electronic medical record  SDOH: Reports she is in the Medicare Coverage Gap  Health Maintenance   Yearly diabetic eye exam: up to date Yearly diabetic foot exam: up to date Urine microalbumin: up to date Yearly influenza vaccination: up to date Td/Tdap vaccination: up to date Pneumonia vaccination: up to date COVID vaccinations: due - Recommended to pursue. declines bivalent booster Shingrix vaccinations: due next month for second dose, first received 10/19 - added to chart Colonoscopy: up to date Bone density scan: up to date Mammogram: up to date  Atrial Fibrillation: Appropriately managed, follows w/ Dr. Ubaldo Glassing; current treatment:  Rate control: metoprolol tartrate 25 mg BID  Anticoagulant treatment: Eliquis 5 mg BID Additional antihypertensives: amlodipine 5 mg daily, losartan 100 mg daily Approved for Eliquis assistance through 10/12/21. Confirms she received medication Recommended to continue current regimen at this time. Follow up with cardiology as  scheduled.  Hyperlipidemia: Uncontrolled; current treatment: simvastatin 20 mg - completing supply before starting atorvastatin 20 mg Discussed increased benefit of high intensity statin therapy given aortic atherosclerosis. Patient amenable to go ahead and start atorvastatin. Refill sent to the pharmacy today.    Chronic Obstructive Pulmonary Disease: Controlled; current treatment: Advair 100/50 mcg 1 puff BID - though taking PRN, denies frequent symptoms Most recent Pulmonary Function Testing: none Current smoker, currently smoking ~ 1 pack per day Previous quit attempts: unsuccessful using hypnosis. No history of nicotine replacement, varenicline, or bupropion.  Triggers to smoke: stress Motivational interviewing provided. Discussed dual nicotine replacement therapy (with lozenge as patient reports fake teeth) vs varenicline along with motivational support. Patient requests to revisit in January.   Hypothyroidism: Controlled per last lab work, current regimen: levothyroxine 88 mcg daily Previously recommended to continue current regimen at this time  Depression/Anxiety: Moderately well controlled per her last PCP visit; current treatment: sertraline 50 mg daily;  Previously recommended to continue current regimen at this time  Patient Goals/Self-Care Activities Over the next 90 days, patient will:  - take medications as prescribed collaborate with provider on medication access solutions      Plan: Telephone follow up appointment with care management team member scheduled for:  8 weeks  [name], PharmD, [place], CPP Clinical Pharmacist [employer] at [facility] [phone]   I have reviewed the above information and agree with above.   [name], MD

## 2021-09-11 DIAGNOSIS — Z7901 Long term (current) use of anticoagulants: Secondary | ICD-10-CM

## 2021-09-11 DIAGNOSIS — J410 Simple chronic bronchitis: Secondary | ICD-10-CM | POA: Diagnosis not present

## 2021-09-11 DIAGNOSIS — E7800 Pure hypercholesterolemia, unspecified: Secondary | ICD-10-CM

## 2021-09-11 DIAGNOSIS — I4891 Unspecified atrial fibrillation: Secondary | ICD-10-CM

## 2021-09-11 DIAGNOSIS — F17210 Nicotine dependence, cigarettes, uncomplicated: Secondary | ICD-10-CM

## 2021-09-12 ENCOUNTER — Other Ambulatory Visit: Payer: Self-pay | Admitting: Internal Medicine

## 2021-09-17 ENCOUNTER — Encounter (INDEPENDENT_AMBULATORY_CARE_PROVIDER_SITE_OTHER): Payer: PPO

## 2021-09-17 ENCOUNTER — Ambulatory Visit (INDEPENDENT_AMBULATORY_CARE_PROVIDER_SITE_OTHER): Payer: PPO | Admitting: Vascular Surgery

## 2021-09-18 ENCOUNTER — Other Ambulatory Visit: Payer: Self-pay

## 2021-09-18 ENCOUNTER — Ambulatory Visit (INDEPENDENT_AMBULATORY_CARE_PROVIDER_SITE_OTHER): Payer: PPO | Admitting: Internal Medicine

## 2021-09-18 ENCOUNTER — Encounter: Payer: Self-pay | Admitting: Internal Medicine

## 2021-09-18 VITALS — BP 114/62 | HR 66 | Temp 96.1°F | Ht 64.0 in | Wt 146.0 lb

## 2021-09-18 DIAGNOSIS — I10 Essential (primary) hypertension: Secondary | ICD-10-CM

## 2021-09-18 DIAGNOSIS — I480 Paroxysmal atrial fibrillation: Secondary | ICD-10-CM

## 2021-09-18 DIAGNOSIS — E034 Atrophy of thyroid (acquired): Secondary | ICD-10-CM

## 2021-09-18 DIAGNOSIS — E7800 Pure hypercholesterolemia, unspecified: Secondary | ICD-10-CM | POA: Diagnosis not present

## 2021-09-18 DIAGNOSIS — E1151 Type 2 diabetes mellitus with diabetic peripheral angiopathy without gangrene: Secondary | ICD-10-CM

## 2021-09-18 DIAGNOSIS — Z720 Tobacco use: Secondary | ICD-10-CM | POA: Diagnosis not present

## 2021-09-18 DIAGNOSIS — Z716 Tobacco abuse counseling: Secondary | ICD-10-CM

## 2021-09-18 LAB — TSH: TSH: 1.64 u[IU]/mL (ref 0.35–5.50)

## 2021-09-18 LAB — COMPREHENSIVE METABOLIC PANEL
ALT: 10 U/L (ref 0–35)
AST: 16 U/L (ref 0–37)
Albumin: 3.8 g/dL (ref 3.5–5.2)
Alkaline Phosphatase: 60 U/L (ref 39–117)
BUN: 12 mg/dL (ref 6–23)
CO2: 27 mEq/L (ref 19–32)
Calcium: 8.7 mg/dL (ref 8.4–10.5)
Chloride: 101 mEq/L (ref 96–112)
Creatinine, Ser: 0.76 mg/dL (ref 0.40–1.20)
GFR: 74.77 mL/min (ref >60.00)
Glucose, Bld: 126 mg/dL — ABNORMAL HIGH (ref 70–99)
Potassium: 3.9 mEq/L (ref 3.5–5.1)
Sodium: 133 mEq/L — ABNORMAL LOW (ref 135–145)
Total Bilirubin: 0.5 mg/dL (ref 0.2–1.2)
Total Protein: 6.3 g/dL (ref 6.0–8.3)

## 2021-09-18 LAB — LIPID PANEL
Cholesterol: 124 mg/dL (ref 0–200)
HDL: 61 mg/dL (ref >39.00)
LDL Cholesterol: 55 mg/dL (ref 0–99)
NonHDL: 63.09
Total CHOL/HDL Ratio: 2
Triglycerides: 42 mg/dL (ref 0.0–149.0)
VLDL: 8.4 mg/dL (ref 0.0–40.0)

## 2021-09-18 LAB — HEMOGLOBIN A1C: Hgb A1c MFr Bld: 5.9 % (ref 4.6–6.5)

## 2021-09-18 NOTE — Assessment & Plan Note (Signed)
Well controlled on current regimen. Renal function stable, no changes today. 

## 2021-09-18 NOTE — Assessment & Plan Note (Signed)
encouraged her to reduce daily use by 1 cig per week

## 2021-09-18 NOTE — Progress Notes (Signed)
Subjective:  Patient ID: Jane Davidson, female    DOB: 12-Feb-1941  Age: 80 y.o. MRN: [id number]  CC: The primary encounter diagnosis was Essential hypertension. Diagnoses of Type 2 diabetes mellitus with diabetic peripheral angiopathy without gangrene, without long-term current use of insulin ([place]), Pure hypercholesterolemia, Tobacco abuse disorder, Tobacco abuse counseling, Hypothyroidism due to acquired atrophy of thyroid, and PAF (paroxysmal atrial fibrillation) ([place]) were also pertinent to this visit.  HPI Jane Davidson presents for  Chief Complaint  Patient presents with   Follow-up    3 month follow up on diabetes   This visit occurred during the SARS-CoV-2 public health emergency.  Safety protocols were in place, including screening questions prior to the visit, additional usage of staff PPE, and extensive cleaning of exam room while observing appropriate contact time as indicated for disinfecting solutions.    T2DM:  She  feels generally well,  But is not  exercising regularly or trying to lose weight. Does not check sugars, , denies  an hypoglycemic symptoms .  Eating a lot of chocolate candy. Not taking anti hyperglycemic  medications . Following a carbohydrate reduced diet , eats mostly salads,  statin changed to atorvastatin .   Wakes up daily with left hand feeling numb, last for a few minutes . Sleeps on left side,  snores but  no apnea observed.   Tobacco abuse:  smoking < 1 pack daily. Has gradually reduced her amount over 3 months ago but has plateaued    HTN:  Patient is taking her medications as prescribed and notes no adverse effects.  Home BP readings have been done about once per week and are  generally < 130/80 .  She is avoiding added salt in her diet and walking regularly about 3 times per week for exercise  .   COPD:  She has been  asymptomatic at rest  for several months. Using Advair  prn  no thrush  She is no longer short of breath walking around the  house.      Outpatient Medications Prior to Visit  Medication Sig Dispense Refill   amLODipine (NORVASC) 5 MG tablet TAKE 1 TABLET(5 MG) BY MOUTH DAILY 90 tablet 1   apixaban (ELIQUIS) 5 MG TABS tablet Take 1 tablet (5 mg total) by mouth 2 (two) times daily. 60 tablet 0   atorvastatin (LIPITOR) 20 MG tablet Take 1 tablet (20 mg total) by mouth daily. 90 tablet 1   Cholecalciferol (VITAMIN D3) 1000 UNITS CAPS Take 1 capsule by mouth daily.     fluticasone (FLONASE) 50 MCG/ACT nasal spray SHAKE LIQUID AND USE 2 SPRAYS IN EACH NOSTRIL DAILY 16 g 2   fluticasone-salmeterol (ADVAIR DISKUS) 100-50 MCG/ACT AEPB Inhale 1 puff into the lungs 2 (two) times daily. 180 each 3   levothyroxine (SYNTHROID) 88 MCG tablet TAKE 1 TABLET(88 MCG) BY MOUTH DAILY 90 tablet 3   loratadine (CLARITIN) 10 MG tablet Take 1 tablet (10 mg total) by mouth daily. 30 tablet 11   losartan (COZAAR) 100 MG tablet TAKE 1 TABLET(100 MG) BY MOUTH DAILY 90 tablet 1   metoprolol tartrate (LOPRESSOR) 25 MG tablet Take 1 tablet (25 mg total) by mouth 2 (two) times daily. 60 tablet 0   nitroGLYCERIN (NITROSTAT) 0.4 MG SL tablet Place 1 tablet (0.4 mg total) under the tongue every 5 (five) minutes as needed for chest pain. 50 tablet 3   sertraline (ZOLOFT) 50 MG tablet Take 1 tablet (50 mg total) by mouth  daily. 90 tablet 1   triamcinolone cream (KENALOG) 0.1 % Apply 1 application topically 2 (two) times daily. 30 g 0   clobetasol ointment (TEMOVATE) 5.00 % Apply 1 application topically 2 (two) times daily. (Patient not taking: Reported on 09/18/2021) 30 g 5   No facility-administered medications prior to visit.    Review of Systems;  Patient denies headache, fevers, malaise, unintentional weight loss, skin rash, eye pain, sinus congestion and sinus pain, sore throat, dysphagia,  hemoptysis , cough, dyspnea, wheezing, chest pain, palpitations, orthopnea, edema, abdominal pain, nausea, melena, diarrhea, constipation, flank pain,  dysuria, hematuria, urinary  Frequency, nocturia, numbness, tingling, seizures,  Focal weakness, Loss of consciousness,  Tremor, insomnia, depression, anxiety, and suicidal ideation.      Objective:  BP 114/62 (BP Location: Left Arm, Patient Position: Sitting, Cuff Size: Normal)   Pulse 66   Temp (!) 96.1 F (35.6 C) (Temporal)   Ht 5' 4" (1.626 m)   Wt 146 lb (66.2 kg)   SpO2 99%   BMI 25.06 kg/m   BP Readings from Last 3 Encounters:  09/18/21 114/62  06/19/21 (!) 142/72  03/20/21 124/78    Wt Readings from Last 3 Encounters:  09/18/21 146 lb (66.2 kg)  06/19/21 141 lb (64 kg)  03/20/21 145 lb 6.4 oz (66 kg)    General appearance: alert, cooperative and appears stated age Ears: normal TM's and external ear canals both ears Throat: lips, mucosa, and tongue normal; teeth and gums normal Neck: no adenopathy, no carotid bruit, supple, symmetrical, trachea midline and thyroid not enlarged, symmetric, no tenderness/mass/nodules Back: symmetric, no curvature. ROM normal. No CVA tenderness. Lungs: clear to auscultation bilaterally Heart: regular rate and rhythm, S1, S2 normal, no murmur, click, rub or gallop Abdomen: soft, non-tender; bowel sounds normal; no masses,  no organomegaly Pulses: 2+ and symmetric bilaterally at DP's  Skin: Skin color, texture, turgor normal. No rashes or lesions Lymph nodes: Cervical, supraclavicular, and axillary nodes normal.  Lab Results  Component Value Date   HGBA1C 5.9 09/18/2021   HGBA1C 5.7 06/19/2021   HGBA1C 5.8 12/07/2020    Lab Results  Component Value Date   CREATININE 0.76 09/18/2021   CREATININE 0.71 06/19/2021   CREATININE 0.84 12/07/2020    Lab Results  Component Value Date   WBC 6.9 06/19/2021   HGB 12.0 06/19/2021   HCT 35.6 (L) 06/19/2021   PLT 268.0 06/19/2021   GLUCOSE 126 (H) 09/18/2021   CHOL 124 09/18/2021   TRIG 42.0 09/18/2021   HDL 61.00 09/18/2021   LDLDIRECT 69.0 05/07/2017   LDLCALC 55 09/18/2021    ALT 10 09/18/2021   AST 16 09/18/2021   NA 133 (L) 09/18/2021   K 3.9 09/18/2021   CL 101 09/18/2021   CREATININE 0.76 09/18/2021   BUN 12 09/18/2021   CO2 27 09/18/2021   TSH 1.64 09/18/2021   INR 1.0 04/08/2020   HGBA1C 5.9 09/18/2021   MICROALBUR 1.5 12/07/2020    MM 3D SCREEN BREAST BILATERAL  Result Date: 03/26/2021 CLINICAL DATA:  Screening. EXAM: DIGITAL SCREENING BILATERAL MAMMOGRAM WITH TOMOSYNTHESIS AND CAD TECHNIQUE: Bilateral screening digital craniocaudal and mediolateral oblique mammograms were obtained. Bilateral screening digital breast tomosynthesis was performed. The images were evaluated with computer-aided detection. COMPARISON:  Previous exam(s). ACR Breast Density Category c: The breast tissue is heterogeneously dense, which may obscure small masses. FINDINGS: There are no findings suspicious for malignancy. The images were evaluated with computer-aided detection. IMPRESSION: No mammographic evidence of malignancy. A result  letter of this screening mammogram will be mailed directly to the patient. RECOMMENDATION: Screening mammogram in one year. (Code:SM-B-01Y) BI-RADS CATEGORY  1: Negative. Electronically Signed   By: Lajean Manes M.D.   On: 03/26/2021 13:13   Assessment & Plan:   Problem List Items Addressed This Visit     Type 2 diabetes mellitus with diabetic peripheral angiopathy without gangrene, without long-term current use of insulin ([place])    .    She has been historically  well-controlled on diet alone.  Patient is up-to-date on eye exams and foot exam has been  normal  Patient has no proteinuria and is tolerating atorvastatin  for CAD risk reduction.    Continue use of daily aspirin  81 mg, statin  And losartan .Encouraged to quit smoking.  Distal pulses are normal  Lab Results  Component Value Date   HGBA1C 5.7 06/19/2021   Lab Results  Component Value Date   MICROALBUR 1.5 12/07/2020        Relevant Orders   Comp Met (CMET) (Completed)   HgB  A1c (Completed)   Tobacco abuse disorder    Has reduced daily use to < 1 pack daily but no recent attempts to reduce further.       Tobacco abuse counseling    encouraged her to reduce daily use by 1 cig per week       PAF (paroxysmal atrial fibrillation) ([facility])    Clinically appears to be in sinus rhythm currently .  Continue metorpolol and Qliquis       Hypothyroidism   Relevant Orders   TSH (Completed)   Hyperlipidemia    Now managed with  atorvastatin , LDL is < 70 Lab Results  Component Value Date   CHOL 124 09/18/2021   HDL 61.00 09/18/2021   LDLCALC 55 09/18/2021   LDLDIRECT 69.0 05/07/2017   TRIG 42.0 09/18/2021   CHOLHDL 2 09/18/2021         Relevant Orders   Lipid Profile (Completed)   Essential hypertension - Primary    Well controlled on current regimen. Renal function stable, no changes today.      Relevant Orders   Comp Met (CMET) (Completed)    I am having Jane Davidson maintain her Vitamin D3, clobetasol ointment, loratadine, nitroGLYCERIN, apixaban, metoprolol tartrate, triamcinolone cream, amLODipine, levothyroxine, fluticasone-salmeterol, sertraline, atorvastatin, losartan, and fluticasone.  No orders of the defined types were placed in this encounter.    I provided  30 minutes of  face-to-face time during this encounter reviewing patient's current problems and past surgeries, labs and imaging studies, providing counseling on the above mentioned problems , and coordination  of care .   Follow-up: No follow-ups on file.   Crecencio Mc, MD

## 2021-09-18 NOTE — Assessment & Plan Note (Signed)
Clinically appears to be in sinus rhythm currently .  Continue metorpolol and Qliquis

## 2021-09-18 NOTE — Patient Instructions (Addendum)
Your blood pressure is at goal,  People with hypertension should have the BP checked  once  a week .  Goal is  140/80 OR LESS BUT NOT BELOW 100/60  If your labs look good today, I'll see you in 6 months!    Hoping you have a wonderful and blessed Christmas season!

## 2021-09-18 NOTE — Assessment & Plan Note (Signed)
Now managed with  atorvastatin , LDL is < 70 Lab Results  Component Value Date   CHOL 124 09/18/2021   HDL 61.00 09/18/2021   LDLCALC 55 09/18/2021   LDLDIRECT 69.0 05/07/2017   TRIG 42.0 09/18/2021   CHOLHDL 2 09/18/2021

## 2021-09-18 NOTE — Assessment & Plan Note (Addendum)
.      She has been historically  well-controlled on diet alone.  Patient is up-to-date on eye exams and foot exam has been  normal  Patient has no proteinuria and is tolerating atorvastatin  for CAD risk reduction.    Continue use of daily aspirin  81 mg, statin  And losartan .Encouraged to quit smoking.  Distal pulses are normal  Lab Results  Component Value Date   HGBA1C 5.7 06/19/2021   Lab Results  Component Value Date   MICROALBUR 1.5 12/07/2020

## 2021-09-18 NOTE — Assessment & Plan Note (Signed)
Has reduced daily use to < 1 pack daily but no recent attempts to reduce further.

## 2021-09-20 ENCOUNTER — Other Ambulatory Visit: Payer: Self-pay | Admitting: Internal Medicine

## 2021-09-20 DIAGNOSIS — F17200 Nicotine dependence, unspecified, uncomplicated: Secondary | ICD-10-CM | POA: Diagnosis not present

## 2021-09-20 DIAGNOSIS — E039 Hypothyroidism, unspecified: Secondary | ICD-10-CM | POA: Diagnosis not present

## 2021-09-20 DIAGNOSIS — E1151 Type 2 diabetes mellitus with diabetic peripheral angiopathy without gangrene: Secondary | ICD-10-CM

## 2021-09-20 DIAGNOSIS — F339 Major depressive disorder, recurrent, unspecified: Secondary | ICD-10-CM | POA: Diagnosis not present

## 2021-09-20 DIAGNOSIS — M461 Sacroiliitis, not elsewhere classified: Secondary | ICD-10-CM | POA: Diagnosis not present

## 2021-09-20 DIAGNOSIS — I208 Other forms of angina pectoris: Secondary | ICD-10-CM | POA: Diagnosis not present

## 2021-09-20 DIAGNOSIS — Z6825 Body mass index (BMI) 25.0-25.9, adult: Secondary | ICD-10-CM | POA: Diagnosis not present

## 2021-09-20 DIAGNOSIS — E785 Hyperlipidemia, unspecified: Secondary | ICD-10-CM | POA: Diagnosis not present

## 2021-09-20 DIAGNOSIS — Z79890 Hormone replacement therapy (postmenopausal): Secondary | ICD-10-CM | POA: Diagnosis not present

## 2021-09-20 DIAGNOSIS — J449 Chronic obstructive pulmonary disease, unspecified: Secondary | ICD-10-CM | POA: Diagnosis not present

## 2021-09-20 DIAGNOSIS — Z7951 Long term (current) use of inhaled steroids: Secondary | ICD-10-CM | POA: Diagnosis not present

## 2021-09-20 DIAGNOSIS — I119 Hypertensive heart disease without heart failure: Secondary | ICD-10-CM | POA: Diagnosis not present

## 2021-10-12 ENCOUNTER — Other Ambulatory Visit: Payer: Self-pay | Admitting: Internal Medicine

## 2021-10-16 ENCOUNTER — Other Ambulatory Visit: Payer: Self-pay

## 2021-10-16 ENCOUNTER — Ambulatory Visit (INDEPENDENT_AMBULATORY_CARE_PROVIDER_SITE_OTHER): Payer: PPO

## 2021-10-16 VITALS — Ht 64.0 in | Wt 146.0 lb

## 2021-10-16 DIAGNOSIS — Z0000 Encounter for general adult medical examination without abnormal findings: Secondary | ICD-10-CM | POA: Diagnosis not present

## 2021-10-16 NOTE — Patient Instructions (Addendum)
°  Jane Davidson , Thank you for taking time to come for your Medicare Wellness Visit. I appreciate your ongoing commitment to your health goals. Please review the following plan we discussed and let me know if I can assist you in the future.   These are the goals we discussed:  Goals       Patient Stated     Medication Monitoring (pt-stated)      Patient Goals/Self-Care Activities Over the next 90 days, patient will:  - take medications as prescribed collaborate with provider on medication access solutions      Quit Smoking (pt-stated)      Not ready to stop smoking today, but will try.  Increase physical activity        This is a list of the screening recommended for you and due dates:  Health Maintenance  Topic Date Due   Eye exam for diabetics  09/27/2021   COVID-19 Vaccine (4 - Booster for Pfizer series) 11/01/2021*   Zoster (Shingles) Vaccine (2 of 2) 12/14/2021*   Tetanus Vaccine  10/16/2022*   Hemoglobin A1C  03/19/2022   Mammogram  03/25/2022   Complete foot exam   09/18/2022   Pneumonia Vaccine  Completed   Flu Shot  Completed   DEXA scan (bone density measurement)  Completed   Hepatitis C Screening: USPSTF Recommendation to screen - Ages 31-79 yo.  Completed   HPV Vaccine  Aged Out  *Topic was postponed. The date shown is not the original due date.

## 2021-10-16 NOTE — Progress Notes (Addendum)
Subjective:   Jane Davidson is a 81 y.o. female who presents for Medicare Annual (Subsequent) preventive examination.  Review of Systems    No ROS.  Medicare Wellness Virtual Visit.  Visual/audio telehealth visit, UTA vital signs.   See social history for additional risk factors.   Cardiac Risk Factors include: advanced age (>58men, >36 women)     Objective:    Today's Vitals   10/16/21 0952  Weight: 146 lb (66.2 kg)  Height: 5\' 4"  (1.626 m)   Body mass index is 25.06 kg/m.  Advanced Directives 10/16/2021 10/15/2020 04/07/2020 04/07/2020 10/12/2019 10/07/2018 10/07/2017  Does Patient Have a Medical Advance Directive? Yes Yes Yes Yes Yes Yes Yes  Type of Arts administrator Power of [place];Living will [place];Living will  Does patient want to make changes to medical advance directive? No - Patient declined No - Patient declined No - Patient declined - No - Patient declined No - Patient declined -  Copy of [place] in Chart? No - copy requested No - copy requested No - copy requested - No - copy requested No - copy requested No - copy requested    Current Medications (verified) Outpatient Encounter Medications as of 10/16/2021  Medication Sig   amLODipine (NORVASC) 5 MG tablet TAKE 1 TABLET(5 MG) BY MOUTH DAILY   apixaban (ELIQUIS) 5 MG TABS tablet Take 1 tablet (5 mg total) by mouth 2 (two) times daily.   atorvastatin (LIPITOR) 20 MG tablet Take 1 tablet (20 mg total) by mouth daily.   Cholecalciferol (VITAMIN D3) 1000 UNITS CAPS Take 1 capsule by mouth daily.   clobetasol ointment (TEMOVATE) 7.25 % Apply 1 application topically 2 (two) times daily. (Patient not taking: Reported on 09/18/2021)   fluticasone (FLONASE) 50 MCG/ACT nasal spray SHAKE LIQUID AND USE 2 SPRAYS IN EACH NOSTRIL DAILY   fluticasone-salmeterol  (ADVAIR DISKUS) 100-50 MCG/ACT AEPB Inhale 1 puff into the lungs 2 (two) times daily.   levothyroxine (SYNTHROID) 88 MCG tablet TAKE 1 TABLET(88 MCG) BY MOUTH DAILY   loratadine (CLARITIN) 10 MG tablet Take 1 tablet (10 mg total) by mouth daily.   losartan (COZAAR) 100 MG tablet TAKE 1 TABLET(100 MG) BY MOUTH DAILY   metoprolol tartrate (LOPRESSOR) 25 MG tablet Take 1 tablet (25 mg total) by mouth 2 (two) times daily.   nitroGLYCERIN (NITROSTAT) 0.4 MG SL tablet Place 1 tablet (0.4 mg total) under the tongue every 5 (five) minutes as needed for chest pain.   sertraline (ZOLOFT) 50 MG tablet Take 1 tablet (50 mg total) by mouth daily.   triamcinolone cream (KENALOG) 0.1 % Apply 1 application topically 2 (two) times daily.   No facility-administered encounter medications on file as of 10/16/2021.    Allergies (verified) Pollen extract, Hctz [hydrochlorothiazide], and Other   History: Past Medical History:  Diagnosis Date   Abnormal Pap smear of cervix 2001   Biopsy normal   Bursitis NEC    Cancer ([place])    skin   COPD (chronic obstructive pulmonary disease) ([place])    History of peripheral vascular disease 2001   s/p bilateral aorto-iliac-femoral bypass /[place]   Hyperlipidemia    hypothyroid    Osteopenia 2009    T scores - 1.5 DEXA 2009   Screening for breast cancer May 2012   [place] mammogram   Tobacco abuse disorder    Vitamin D  deficiency April 2011   replaced withDrsido for level of 11.3 ng/ml   Vitamin D deficiency April 2011   replaced withDrsido for level of 11.3 ng/ml   Past Surgical History:  Procedure Laterality Date   BREAST BIOPSY Left    benign   CAROTID ENDARTERECTOMY Right 2016   Leotis Pain   CATARACT EXTRACTION  Sept 2011   CHOLECYSTECTOMY     HERNIA REPAIR  Jun 2011   [place]    TUBAL LIGATION     Family History  Problem Relation Age of Onset   Hypertension Mother    Arthritis Mother    Diabetes Father    Dementia Sister    Breast cancer Neg Hx     Social History   Socioeconomic History   Marital status: Widowed    Spouse name: Not on file   Number of children: Not on file   Years of education: Not on file   Highest education level: Not on file  Occupational History   Not on file  Tobacco Use   Smoking status: Every Day    Packs/day: 1.04    Years: 40.00    Pack years: 41.60    Types: Cigarettes   Smokeless tobacco: Never  Substance and Sexual Activity   Alcohol use: No   Drug use: No   Sexual activity: Never  Other Topics Concern   Not on file  Social History Narrative   Not on file   Social Determinants of Health   Financial Resource Strain: Medium Risk   Difficulty of Paying Living Expenses: Somewhat hard  Food Insecurity: No Food Insecurity   Worried About Running Out of Food in the Last Year: Never true   Ran Out of Food in the Last Year: Never true  Transportation Needs: No Transportation Needs   Lack of Transportation (Medical): No   Lack of Transportation (Non-Medical): No  Physical Activity: Not on file  Stress: No Stress Concern Present   Feeling of Stress : Only a little  Social Connections: Unknown   Frequency of Communication with Friends and Family: More than three times a week   Frequency of Social Gatherings with Friends and Family: Once a week   Attends Religious Services: 1 to 4 times per year   Active Member of [employer] or Organizations: Yes   Attends Music therapist: Not on file   Marital Status: Not on file    Tobacco Counseling Ready to quit: Not Answered Counseling given: Not Answered   Clinical Intake:  Pre-visit preparation completed: Yes        Diabetes: No  How often do you need to have someone help you when you read instructions, pamphlets, or other written materials from your doctor or pharmacy?: 1 - Never    Interpreter Needed?: No      Activities of Daily Living In your present state of health, do you have any difficulty performing the  following activities: 10/16/2021  Hearing? N  Vision? N  Difficulty concentrating or making decisions? N  Walking or climbing stairs? N  Dressing or bathing? N  Doing errands, shopping? N  Preparing Food and eating ? N  Using the Toilet? N  In the past six months, have you accidently leaked urine? N  Do you have problems with loss of bowel control? N  Managing your Medications? N  Managing your Finances? N  Housekeeping or managing your Housekeeping? N  Some recent data might be hidden    Patient Care Team:  Crecencio Mc, MD as PCP - General (Internal Medicine) De Hollingshead, RPH-CPP (Pharmacist)  Indicate any recent Medical Services you may have received from other than [facility] providers in the past year (date may be approximate).     Assessment:   This is a routine wellness examination for Yasmeen.  Virtual Visit via Telephone Note  I connected with  [name] on 10/16/21 at  9:45 AM EST by telephone and verified that I am speaking with the correct person using two identifiers.  Persons participating in the virtual visit: patient/Nurse Health Advisor   I discussed the limitations, risks, security and privacy concerns of performing an evaluation and management service by telephone and the availability of in person appointments. The patient expressed understanding and agreed to proceed.  Interactive audio and video telecommunications were attempted between this nurse and patient, however failed, due to patient having technical difficulties OR patient did not have access to video capability.  We continued and completed visit with audio only.  Some vital signs may be absent or patient reported.   Hearing/Vision screen Hearing Screening - Comments:: Patient is able to hear conversational tones without difficulty.  No issues reported.   Vision Screening - Comments:: Wears corrective lenses  Dietary issues and exercise activities discussed: Current Exercise Habits:  Home exercise routine, Intensity: Mild Healthy diet Good water intake   Goals Addressed               This Visit's Progress     Patient Stated     Quit Smoking (pt-stated)        Not ready to stop smoking today, but will try.  Increase physical activity       Depression Screen PHQ 2/9 Scores 09/18/2021 06/19/2021 03/20/2021 03/13/2021 10/15/2020 10/12/2019 10/07/2018  PHQ - 2 Score 4 4 0 0 0 1 0  PHQ- 9 Score 8 10 - - - - -    Fall Risk Fall Risk  10/16/2021 09/18/2021 06/19/2021 03/20/2021 03/13/2021  Falls in the past year? 0 0 0 0 0  Comment - - - - -  Number falls in past yr: 0 - - 0 0  Injury with Fall? - - - 0 0  Risk for fall due to : - No Fall Risks - - -  Follow up Falls evaluation completed Falls evaluation completed Falls evaluation completed Falls evaluation completed Falls evaluation completed    [place]: Home free of loose throw rugs in walkways, pet beds, electrical cords, etc? Yes  Adequate lighting in your home to reduce risk of falls? Yes   ASSISTIVE DEVICES UTILIZED TO PREVENT FALLS: Life alert? No  Use of a cane, walker or w/c? No   TIMED UP AND GO: Was the test performed? No .   Cognitive Function: MMSE - Mini Mental State Exam 10/12/2019 10/09/2015  Not completed: Unable to complete -  Orientation to time - 5  Orientation to Place - 5  Registration - 3  Attention/ Calculation - 5  Recall - 3  Language- name 2 objects - 2  Language- repeat - 1  Language- follow 3 step command - 3  Language- read & follow direction - 1  Write a sentence - 1  Copy design - 1  Total score - 30     6CIT Screen 10/15/2020 10/07/2018 10/07/2017 10/03/2016  What Year? 0 points 0 points 0 points 0 points  What month? 0 points 0 points 0 points 0 points  What time? 0 points 0 points 0 points 0 points  Count back from 20 - 0 points 0 points 0 points  Months in reverse 0 points 0 points 0 points 0 points  Repeat phrase - 0 points 0 points  -  Total Score - 0 0 -    Immunizations Immunization History  Administered Date(s) Administered   Fluad Quad(high Dose 65+) 06/17/2019, 10/18/2020, 06/19/2021   Influenza Split 07/05/2012, 07/27/2013, 06/29/2014   Influenza, High Dose Seasonal PF 06/10/2017, 06/15/2018   Influenza,inj,Quad PF,6+ Mos 06/04/2016   Influenza-Unspecified 07/20/2015   PFIZER(Purple Top)SARS-COV-2 Vaccination 11/29/2019, 12/20/2019, 08/09/2020   Pneumococcal Conjugate-13 04/06/2014   Pneumococcal Polysaccharide-23 07/05/2012, 11/18/2017   Tdap 10/16/2011   Zoster Recombinat (Shingrix) 07/31/2021   Zoster, Live 12/19/2011   TDAP status: Due, Education has been provided regarding the importance of this vaccine. Advised may receive this vaccine at local pharmacy or Health Dept. Aware to provide a copy of the vaccination record if obtained from local pharmacy or Health Dept. Verbalized acceptance and understanding. Deferred.   Shingles Vaccine? Agrees to update immunization record once series completed.    Screening Tests Health Maintenance  Topic Date Due   OPHTHALMOLOGY EXAM  09/27/2021   COVID-19 Vaccine (4 - Booster for Pfizer series) 11/01/2021 (Originally 10/04/2020)   Zoster Vaccines- Shingrix (2 of 2) 12/14/2021 (Originally 09/25/2021)   TETANUS/TDAP  10/16/2022 (Originally 10/15/2021)   HEMOGLOBIN A1C  03/19/2022   MAMMOGRAM  03/25/2022   FOOT EXAM  09/18/2022   Pneumonia Vaccine 29+ Years old  Completed   INFLUENZA VACCINE  Completed   DEXA SCAN  Completed   Hepatitis C Screening  Completed   HPV VACCINES  Aged Out   Health Maintenance Health Maintenance Due  Topic Date Due   OPHTHALMOLOGY EXAM  09/27/2021   Vision Screening: Recommended annual ophthalmology exams for early detection of glaucoma and other disorders of the eye.  Dental Screening: Recommended annual dental exams for proper oral hygiene  Community Resource Referral / Chronic Care Management: CRR required this visit?  No    CCM required this visit?  No      Plan:   Keep all routine maintenance appointments.   I have personally reviewed and noted the following in the patients chart:   Medical and social history Use of alcohol, tobacco or illicit drugs  Current medications and supplements including opioid prescriptions. Not taking opioid.  Functional ability and status Nutritional status Physical activity Advanced directives List of other physicians Hospitalizations, surgeries, and ER visits in previous 12 months Vitals Screenings to include cognitive, depression, and falls Referrals and appointments  In addition, I have reviewed and discussed with patient certain preventive protocols, quality metrics, and best practice recommendations. A written personalized care plan for preventive services as well as general preventive health recommendations were provided to patient.     OBrien-Blaney, Hildagard Sobecki L, LPN   03/19/3418     I have reviewed the above information and agree with above.   [name], MD

## 2021-10-18 ENCOUNTER — Encounter (INDEPENDENT_AMBULATORY_CARE_PROVIDER_SITE_OTHER): Payer: PPO

## 2021-10-18 ENCOUNTER — Ambulatory Visit (INDEPENDENT_AMBULATORY_CARE_PROVIDER_SITE_OTHER): Payer: PPO | Admitting: Vascular Surgery

## 2021-10-21 DIAGNOSIS — Z7984 Long term (current) use of oral hypoglycemic drugs: Secondary | ICD-10-CM | POA: Diagnosis not present

## 2021-10-21 DIAGNOSIS — H43813 Vitreous degeneration, bilateral: Secondary | ICD-10-CM | POA: Diagnosis not present

## 2021-10-21 DIAGNOSIS — H524 Presbyopia: Secondary | ICD-10-CM | POA: Diagnosis not present

## 2021-10-21 DIAGNOSIS — E119 Type 2 diabetes mellitus without complications: Secondary | ICD-10-CM | POA: Diagnosis not present

## 2021-10-21 DIAGNOSIS — H5203 Hypermetropia, bilateral: Secondary | ICD-10-CM | POA: Diagnosis not present

## 2021-10-21 DIAGNOSIS — Z961 Presence of intraocular lens: Secondary | ICD-10-CM | POA: Diagnosis not present

## 2021-10-21 DIAGNOSIS — H52223 Regular astigmatism, bilateral: Secondary | ICD-10-CM | POA: Diagnosis not present

## 2021-10-21 LAB — HM DIABETES EYE EXAM

## 2021-11-06 ENCOUNTER — Ambulatory Visit (INDEPENDENT_AMBULATORY_CARE_PROVIDER_SITE_OTHER): Payer: PPO | Admitting: Pharmacist

## 2021-11-06 DIAGNOSIS — J449 Chronic obstructive pulmonary disease, unspecified: Secondary | ICD-10-CM

## 2021-11-06 DIAGNOSIS — E7800 Pure hypercholesterolemia, unspecified: Secondary | ICD-10-CM

## 2021-11-06 DIAGNOSIS — Z720 Tobacco use: Secondary | ICD-10-CM

## 2021-11-06 DIAGNOSIS — I10 Essential (primary) hypertension: Secondary | ICD-10-CM

## 2021-11-06 MED ORDER — NICOTINE 21 MG/24HR TD PT24: 21.0000 mg | MEDICATED_PATCH | Freq: Every day | TRANSDERMAL | 2 refills | Status: DC

## 2021-11-06 MED ORDER — FLUTICASONE-SALMETEROL 100-50 MCG/ACT IN AEPB: 1.0000 | INHALATION_SPRAY | Freq: Two times a day (BID) | RESPIRATORY_TRACT | 1 refills | Status: DC

## 2021-11-06 MED ORDER — NICOTINE POLACRILEX 4 MG MT GUM: 4.0000 mg | CHEWING_GUM | OROMUCOSAL | 5 refills | Status: DC | PRN

## 2021-11-06 MED ORDER — NICOTINE 14 MG/24HR TD PT24: 14.0000 mg | MEDICATED_PATCH | Freq: Every day | TRANSDERMAL | 2 refills | Status: DC

## 2021-11-06 NOTE — Chronic Care Management (AMB) (Signed)
Chronic Care Management CCM Pharmacy Note  11/06/2021 Name:  Jane Davidson MRN:  [id number] DOB:  12/30/1940  Summary: - Interested in helping smoking. Varenicline is Tier 4 on her insurance.  - Discussed generic fluticasone/salmeterol is cheaper than brand  Recommendations/Changes made from today's visit: - Switch to generic fluticasone/salmeterol. Script sent - Start nicotine patches + lozenges. GoodRx given  Subjective: Jane Davidson is an 81 y.o. year old female who is a primary patient of Tullo, Aris Everts, MD.  The CCM team was consulted for assistance with disease management and care coordination needs.    Engaged with patient by telephone for follow up visit for pharmacy case management and/or care coordination services.   Objective:  Medications Reviewed Today     Reviewed by De Hollingshead, RPH-CPP (Pharmacist) on 11/06/21 at 1307  Med List Status: <None>   Medication Order Taking? Sig Documenting Provider Last Dose Status Informant  amLODipine (NORVASC) 5 MG tablet [id number] Yes TAKE 1 TABLET(5 MG) BY MOUTH DAILY Crecencio Mc, MD Taking Active   apixaban (ELIQUIS) 5 MG TABS tablet [id number] Yes Take 1 tablet (5 mg total) by mouth 2 (two) times daily. Barb Merino, MD Taking Active   atorvastatin (LIPITOR) 20 MG tablet [id number] Yes Take 1 tablet (20 mg total) by mouth daily. Crecencio Mc, MD Taking Active   Cholecalciferol (VITAMIN D3) 1000 UNITS CAPS [id number] Yes Take 1 capsule by mouth daily. [provider] Taking Active Self  clobetasol ointment (TEMOVATE) 0.05 % [id number] No Apply 1 application topically 2 (two) times daily.  Patient not taking: Reported on 09/18/2021   Crecencio Mc, MD Not Taking Active Self  fluticasone [facility]) 50 MCG/ACT nasal spray [id number] Yes SHAKE LIQUID AND USE 2 SPRAYS IN EACH NOSTRIL DAILY Crecencio Mc, MD Taking Active   fluticasone-salmeterol (ADVAIR DISKUS) 100-50 MCG/ACT AEPB [id number] Yes Inhale 1  puff into the lungs 2 (two) times daily. Crecencio Mc, MD Taking Active   levothyroxine (SYNTHROID) 88 MCG tablet [id number] Yes TAKE 1 TABLET(88 MCG) BY MOUTH DAILY Crecencio Mc, MD Taking Active   loratadine (CLARITIN) 10 MG tablet [id number] Yes Take 1 tablet (10 mg total) by mouth daily. Ria Bush, MD Taking Active Self  losartan (COZAAR) 100 MG tablet [id number] Yes TAKE 1 TABLET(100 MG) BY MOUTH DAILY Crecencio Mc, MD Taking Active   metoprolol tartrate (LOPRESSOR) 25 MG tablet [id number] Yes Take 1 tablet (25 mg total) by mouth 2 (two) times daily. Barb Merino, MD Taking Active   nitroGLYCERIN (NITROSTAT) 0.4 MG SL tablet [id number]  Place 1 tablet (0.4 mg total) under the tongue every 5 (five) minutes as needed for chest pain. Crecencio Mc, MD  Active   sertraline (ZOLOFT) 50 MG tablet [id number] Yes Take 1 tablet (50 mg total) by mouth daily. Crecencio Mc, MD Taking Active   triamcinolone cream (KENALOG) 0.1 % [id number] Yes Apply 1 application topically 2 (two) times daily. Doreen Beam, FNP Taking Active             Pertinent Labs:   Lab Results  Component Value Date   HGBA1C 5.9 09/18/2021   Lab Results  Component Value Date   CHOL 124 09/18/2021   HDL 61.00 09/18/2021   LDLCALC 55 09/18/2021   LDLDIRECT 69.0 05/07/2017   TRIG 42.0 09/18/2021   CHOLHDL 2 09/18/2021   Lab Results  Component Value Date   CREATININE 0.76 09/18/2021   BUN 12 09/18/2021   NA  133 (L) 09/18/2021   K 3.9 09/18/2021   CL 101 09/18/2021   CO2 27 09/18/2021    SDOH:  (Social Determinants of Health) assessments and interventions performed:  SDOH Interventions    Flowsheet Row Most Recent Value  SDOH Interventions   SDOH Interventions for the Following Domains Tobacco  Tobacco Interventions Cessation Materials Given and Reviewed       CCM Care Plan  Review of patient past medical history, allergies, medications, health status, including review of  consultants reports, laboratory and other test data, was performed as part of comprehensive evaluation and provision of chronic care management services.   Care Plan : Medication Management  Updates made by De Hollingshead, RPH-CPP since 11/06/2021 12:00 AM     Problem: COPD, Atrial Fibrillation      Long-Range Goal: Disease Progression Prevention   Start Date: 07/02/2021  Recent Progress: On track  Priority: High  Note:   Current Barriers:  Unable to independently afford treatment regimen  Pharmacist Clinical Goal(s):  Over the next 90 days, patient will verbalize ability to afford treatment regimen through collaboration with PharmD and provider.   Interventions: 1:1 collaboration with Crecencio Mc, MD regarding development and update of comprehensive plan of care as evidenced by provider attestation and co-signature Inter-disciplinary care team collaboration (see longitudinal plan of care) Comprehensive medication review performed; medication list updated in electronic medical record  SDOH: Reports she is in the Medicare Coverage Gap  Health Maintenance   Yearly diabetic eye exam: up to date Yearly diabetic foot exam: up to date Urine microalbumin: up to date Yearly influenza vaccination: up to date Td/Tdap vaccination: up to date Pneumonia vaccination: up to date COVID vaccinations: due - Recommended to pursue. declines bivalent booster Shingrix vaccinations: due next month for second dose, first received 10/19 - added to chart Colonoscopy: up to date Bone density scan: up to date Mammogram: up to date  Atrial Fibrillation: Appropriately managed, follows w/ Dr. Ubaldo Glassing; current treatment:  Rate control: metoprolol tartrate 25 mg BID  Anticoagulant treatment: Eliquis 5 mg BID Additional antihypertensives: amlodipine 5 mg daily, losartan 100 mg daily  Recommended to continue current regimen at this time. Follow up with cardiology as  scheduled.  Hyperlipidemia: Uncontrolled; current treatment: atorvastatin 20 mg daily Discussed increased benefit of high intensity statin therapy given aortic atherosclerosis. Patient amenable to go ahead and start atorvastatin. Refill sent to the pharmacy today.    Chronic Obstructive Pulmonary Disease: Controlled; current treatment: Advair 100/50 mcg 1 puff BID - though taking PRN, denies frequent symptoms Most recent Pulmonary Function Testing: none Current smoker, currently smoking ~ 1 pack per day Previous quit attempts: unsuccessful using hypnosis. No history of varenicline or bupropion. Reports patches were only somewhat helps. Triggers to smoke: stress Motivational interviewing provided. Discussed dual nicotine replacement therapy (with lozenge as patient reports fake teeth) vs varenicline along with motivational support. Patient requests to revisit in January.  Discussed that Grant Ruts is preferred over Advair. Patient amenable to switch.   Hypothyroidism: Controlled per last lab work, current regimen: levothyroxine 88 mcg daily Previously recommended to continue current regimen at this time  Depression/Anxiety: Moderately well controlled per her last PCP visit; current treatment: sertraline 50 mg daily;  Previously recommended to continue current regimen at this time  Patient Goals/Self-Care Activities Over the next 90 days, patient will:  - take medications as prescribed collaborate with provider on medication access solutions      Plan: Telephone follow up appointment with care management  team member scheduled for:  4 weeks  [name], PharmD, [place], [place] Clinical Pharmacist [employer] at [employer] [phone]

## 2021-11-06 NOTE — Patient Instructions (Addendum)
Jane Davidson,   It was great talking to you today!  - Start nicotine patch 21 mg daily, place a patch every morning. Leave it on overnight and take off the next morning, however, if you start to have a difficult time sleeping, you may take the patch off before going to bed.  - Throughout the day, you can use the nicotine lozenges. You may use up to 24 lozenges every day when first starting. Suck on a lozenge until it tingles, then park it in your cheek to absorb.   We also recommend that you partner with the counselors at the [facility] to talk through strategies to help cut down and quit smoking. This is a free service. Call them at 1-800-QUIT-NOW [phone])  Please schedule your yearly diabetic eye exam and have those results sent to our office.   We recommend the Shingrix (shingles) vaccine series for all over age 80. We also recommend a dose of Tdap (tetanus, diptheria, and pertussis) every 10 years. Both should have a $0 copay on all Medicare plans this year. You can pursue this without a prescription at your local pharmacy, or feel free to call our [place] at [facility] at [phone].  Take care,   [name], PharmD  Visit Information  Following are the goals we discussed today:    Patient Goals/Self-Care Activities Over the next 90 days, patient will:  - take medications as prescribed collaborate with provider on medication access solutions        Plan: Telephone follow up appointment with care management team member scheduled for:  4 weeks   [name], PharmD, [place], CPP Clinical Pharmacist [place] at [facility] [phone]     Please call the care guide team at [phone] if you need to cancel or reschedule your appointment.   Print copy of patient instructions, educational materials, and care plan provided in person.

## 2021-11-12 ENCOUNTER — Ambulatory Visit (INDEPENDENT_AMBULATORY_CARE_PROVIDER_SITE_OTHER): Payer: PPO

## 2021-11-12 ENCOUNTER — Other Ambulatory Visit: Payer: Self-pay

## 2021-11-12 ENCOUNTER — Ambulatory Visit (INDEPENDENT_AMBULATORY_CARE_PROVIDER_SITE_OTHER): Payer: PPO | Admitting: Vascular Surgery

## 2021-11-12 ENCOUNTER — Other Ambulatory Visit (INDEPENDENT_AMBULATORY_CARE_PROVIDER_SITE_OTHER): Payer: Self-pay | Admitting: Nurse Practitioner

## 2021-11-12 ENCOUNTER — Encounter (INDEPENDENT_AMBULATORY_CARE_PROVIDER_SITE_OTHER): Payer: Self-pay | Admitting: Vascular Surgery

## 2021-11-12 VITALS — BP 173/84 | HR 67 | Resp 16 | Wt 145.6 lb

## 2021-11-12 DIAGNOSIS — E1151 Type 2 diabetes mellitus with diabetic peripheral angiopathy without gangrene: Secondary | ICD-10-CM | POA: Diagnosis not present

## 2021-11-12 DIAGNOSIS — J449 Chronic obstructive pulmonary disease, unspecified: Secondary | ICD-10-CM

## 2021-11-12 DIAGNOSIS — I6523 Occlusion and stenosis of bilateral carotid arteries: Secondary | ICD-10-CM

## 2021-11-12 DIAGNOSIS — E7800 Pure hypercholesterolemia, unspecified: Secondary | ICD-10-CM

## 2021-11-12 DIAGNOSIS — I10 Essential (primary) hypertension: Secondary | ICD-10-CM

## 2021-11-12 NOTE — Progress Notes (Signed)
MRN : [id number]  Jane Davidson is a 81 y.o. ([birth year]) female who presents with chief complaint of  Chief Complaint  Patient presents with   Follow-up    Ultrasound follow up  .  History of Present Illness: Patient returns in follow-up of her carotid disease.  She is doing well.  She is status post right carotid endarterectomy in years past.  No focal neurologic symptoms or problems since her last visit. Specifically, the patient denies amaurosis fugax, speech or swallowing difficulties, or arm or leg weakness or numbness. Duplex today shows stable 40 to 59% left ICA stenosis with a patent right carotid endarterectomy.  Current Outpatient Medications  Medication Sig Dispense Refill   amLODipine (NORVASC) 5 MG tablet TAKE 1 TABLET(5 MG) BY MOUTH DAILY 90 tablet 1   apixaban (ELIQUIS) 5 MG TABS tablet Take 1 tablet (5 mg total) by mouth 2 (two) times daily. 60 tablet 0   atorvastatin (LIPITOR) 20 MG tablet Take 1 tablet (20 mg total) by mouth daily. 90 tablet 1   Cholecalciferol (VITAMIN D3) 1000 UNITS CAPS Take 1 capsule by mouth daily.     fluticasone (FLONASE) 50 MCG/ACT nasal spray SHAKE LIQUID AND USE 2 SPRAYS IN EACH NOSTRIL DAILY 16 g 2   fluticasone-salmeterol (WIXELA INHUB) 100-50 MCG/ACT AEPB Inhale 1 puff into the lungs 2 (two) times daily. 180 each 1   levothyroxine (SYNTHROID) 88 MCG tablet TAKE 1 TABLET(88 MCG) BY MOUTH DAILY 90 tablet 3   loratadine (CLARITIN) 10 MG tablet Take 1 tablet (10 mg total) by mouth daily. 30 tablet 11   losartan (COZAAR) 100 MG tablet TAKE 1 TABLET(100 MG) BY MOUTH DAILY 90 tablet 1   metoprolol tartrate (LOPRESSOR) 25 MG tablet Take 1 tablet (25 mg total) by mouth 2 (two) times daily. 60 tablet 0   nicotine (NICODERM CQ - DOSED IN MG/24 HOURS) 14 mg/24hr patch Place 1 patch (14 mg total) onto the skin daily. 28 patch 2   nicotine (NICODERM CQ - DOSED IN MG/24 HOURS) 21 mg/24hr patch Place 1 patch (21 mg total) onto the skin daily. 28 patch  2   nicotine polacrilex (NICORETTE) 4 MG gum Take 1 each (4 mg total) by mouth as needed for smoking cessation. Max dose 24 pieces per day 100 tablet 5   nitroGLYCERIN (NITROSTAT) 0.4 MG SL tablet Place 1 tablet (0.4 mg total) under the tongue every 5 (five) minutes as needed for chest pain. 50 tablet 3   sertraline (ZOLOFT) 50 MG tablet Take 1 tablet (50 mg total) by mouth daily. 90 tablet 1   triamcinolone cream (KENALOG) 0.1 % Apply 1 application topically 2 (two) times daily. 30 g 0   clobetasol ointment (TEMOVATE) 5.62 % Apply 1 application topically 2 (two) times daily. (Patient not taking: Reported on 09/18/2021) 30 g 5   No current facility-administered medications for this visit.    Past Medical History:  Diagnosis Date   Abnormal Pap smear of cervix 2001   Biopsy normal   Bursitis NEC    Cancer ([place])    skin   COPD (chronic obstructive pulmonary disease) ([place])    History of peripheral vascular disease 2001   s/p bilateral aorto-iliac-femoral bypass /[facility]   Hyperlipidemia    hypothyroid    Osteopenia 2009    T scores - 1.5 DEXA 2009   Screening for breast cancer May 2012   [facility]   Tobacco abuse disorder    Vitamin D deficiency April 2011  replaced withDrsido for level of 11.3 ng/ml   Vitamin D deficiency April 2011   replaced withDrsido for level of 11.3 ng/ml    Past Surgical History:  Procedure Laterality Date   BREAST BIOPSY Left    benign   CAROTID ENDARTERECTOMY Right 2016   [place]   CATARACT EXTRACTION  Sept 2011   CHOLECYSTECTOMY     HERNIA REPAIR  Jun 2011   [place]    TUBAL LIGATION       Social History   Tobacco Use   Smoking status: Every Day    Packs/day: 1.04    Years: 40.00    Pack years: 41.60    Types: Cigarettes   Smokeless tobacco: Never  Substance Use Topics   Alcohol use: No   Drug use: No      Family History  Problem Relation Age of Onset   Hypertension Mother    Arthritis Mother    Diabetes Father     Dementia Sister    Breast cancer Neg Hx      Allergies  Allergen Reactions   Pollen Extract    Hctz [Hydrochlorothiazide]     Hyponatremia    Other Cough    Other reaction(s): Other (See Comments) Running nose     REVIEW OF SYSTEMS (Negative unless checked)  Constitutional: [] Weight loss  [] Fever  [] Chills Cardiac: [] Chest pain   [] Chest pressure   [] Palpitations   [] Shortness of breath when laying flat   [] Shortness of breath at rest   [] Shortness of breath with exertion. Vascular:  [] Pain in legs with walking   [] Pain in legs at rest   [] Pain in legs when laying flat   [] Claudication   [] Pain in feet when walking  [] Pain in feet at rest  [] Pain in feet when laying flat   [] History of DVT   [] Phlebitis   [] Swelling in legs   [] Varicose veins   [] Non-healing ulcers Pulmonary:   [] Uses home oxygen   [] Productive cough   [] Hemoptysis   [] Wheeze  [x] COPD   [] Asthma Neurologic:  [] Dizziness  [] Blackouts   [] Seizures   [] History of stroke   [] History of TIA  [] Aphasia   [] Temporary blindness   [] Dysphagia   [] Weakness or numbness in arms   [] Weakness or numbness in legs Musculoskeletal:  [x] Arthritis   [] Joint swelling   [] Joint pain   [] Low back pain Hematologic:  [] Easy bruising  [] Easy bleeding   [] Hypercoagulable state   [] Anemic  [] Hepatitis Gastrointestinal:  [] Blood in stool   [] Vomiting blood  [] Gastroesophageal reflux/heartburn   [] Difficulty swallowing. Genitourinary:  [] Chronic kidney disease   [] Difficult urination  [] Frequent urination  [] Burning with urination   [] Blood in urine Skin:  [] Rashes   [] Ulcers   [] Wounds Psychological:  [] History of anxiety   []  History of major depression.  Physical Examination  Vitals:   11/12/21 1345  BP: (!) 173/84  Pulse: 67  Resp: 16  Weight: 145 lb 9.6 oz (66 kg)   Body mass index is 24.99 kg/m. Gen:  WD/WN, NAD. Appears younger than stated age. Head: [place]/AT, No temporalis wasting. Ear/Nose/Throat: Hearing grossly intact, nares  w/o erythema or drainage, trachea midline Eyes: Conjunctiva clear. Sclera non-icteric Neck: Supple.  Soft left carotid bruit  Pulmonary:  Good air movement, equal and clear to auscultation bilaterally.  Cardiac: RRR, No JVD Vascular:  Vessel Right Left  Radial Palpable Palpable       Musculoskeletal: M/S 5/5 throughout.  No deformity or atrophy. No edema.  Neurologic: CN 2-12 intact. Sensation grossly intact in extremities.  Symmetrical.  Speech is fluent. Motor exam as listed above. Psychiatric: Judgment intact, Mood & affect appropriate for pt's clinical situation. Dermatologic: No rashes or ulcers noted.  No cellulitis or open wounds.     CBC Lab Results  Component Value Date   WBC 6.9 06/19/2021   HGB 12.0 06/19/2021   HCT 35.6 (L) 06/19/2021   MCV 92.4 06/19/2021   PLT 268.0 06/19/2021    BMET    Component Value Date/Time   NA 133 (L) 09/18/2021 1041   NA 132 (L) 11/03/2014 0430   K 3.9 09/18/2021 1041   K 4.6 11/03/2014 0430   CL 101 09/18/2021 1041   CL 102 11/03/2014 0430   CO2 27 09/18/2021 1041   CO2 24 11/03/2014 0430   GLUCOSE 126 (H) 09/18/2021 1041   GLUCOSE 134 (H) 11/03/2014 0430   BUN 12 09/18/2021 1041   BUN 19 (H) 11/03/2014 0430   CREATININE 0.76 09/18/2021 1041   CREATININE 0.80 11/03/2014 0430   CALCIUM 8.7 09/18/2021 1041   CALCIUM 8.4 (L) 11/03/2014 0430   GFRNONAA >60 04/08/2020 0431   GFRNONAA >60 11/03/2014 0430   GFRAA >60 04/08/2020 0431   GFRAA >60 11/03/2014 0430   CrCl cannot be calculated (Patient's most recent lab result is older than the maximum 21 days allowed.).  COAG Lab Results  Component Value Date   INR 1.0 04/08/2020   INR 1.0 11/03/2014    Radiology No results found.   Assessment/Plan Essential hypertension blood pressure control important in reducing the progression of atherosclerotic disease. On appropriate oral medications.     Diabetes mellitus without complication (HCC) blood glucose control  important in reducing the progression of atherosclerotic disease. Also, involved in wound healing. On appropriate medications.     Hyperlipidemia lipid control important in reducing the progression of atherosclerotic disease. Continue statin therapy  Carotid stenosis Duplex today shows stable 40 to 59% left ICA stenosis with a patent right carotid endarterectomy.  No change from previous study.  Continue current medical regimen.  Follow-up in 1 year with duplex.    Leotis Pain, MD  11/12/2021 2:08 PM    This note was created with Dragon medical transcription system.  Any errors from dictation are purely unintentional

## 2021-11-12 NOTE — Assessment & Plan Note (Signed)
Duplex today shows stable 40 to 59% left ICA stenosis with a patent right carotid endarterectomy.  No change from previous study.  Continue current medical regimen.  Follow-up in 1 year with duplex.

## 2021-11-28 ENCOUNTER — Encounter: Payer: Self-pay | Admitting: Internal Medicine

## 2021-12-04 ENCOUNTER — Telehealth: Payer: PPO

## 2021-12-04 ENCOUNTER — Telehealth: Payer: Self-pay | Admitting: Pharmacist

## 2021-12-04 NOTE — Telephone Encounter (Signed)
°  Chronic Care Management   Note  12/04/2021 Name: Jane Davidson MRN: [id number] DOB: Jun 08, 1941   Attempted to contact patient for scheduled appointment for medication management support. Left HIPAA compliant message for patient to return my call at their convenience.    Plan: - If I do not hear back from the patient by end of business today, will collaborate with Care Guide to outreach to schedule follow up with me   [name], PharmD, [place], [place] Pharmacist [employer] at [employer] [phone]

## 2021-12-11 ENCOUNTER — Other Ambulatory Visit: Payer: Self-pay | Admitting: Internal Medicine

## 2021-12-17 ENCOUNTER — Ambulatory Visit: Payer: Self-pay | Admitting: Pharmacist

## 2021-12-17 NOTE — Chronic Care Management (AMB) (Signed)
?  Chronic Care Management  ? ?Note ? ?12/17/2021 ?Name: Jane Davidson MRN: [id number] DOB: [birth year] ? ? ? ?Closing pharmacy CCM case at this time. Will collaborate with Care Guide to outreach to schedule follow up with RN CM. Patient has clinic contact information for future questions or concerns.  ? ?[name], PharmD, [place], CPP ?Clinical Pharmacist ?Therapist, music at [employer] ?[phone] ? ?

## 2022-01-02 ENCOUNTER — Telehealth: Payer: Self-pay

## 2022-01-02 NOTE — Chronic Care Management (AMB) (Signed)
?  Chronic Care Management  ? ?Note ? ?01/02/2022 ?Name: Jane Davidson MRN: [id number] DOB: [birth year] ? ?Jane Davidson is a 81 y.o. year old female who is a primary care patient of Derrel Nip, Aris Everts, MD. Jane Davidson is currently enrolled in care management services. An additional referral for RNCM was placed.  ? ?Follow up plan: ?Patient declines further follow up and engagement by the care management team. Appropriate care team members and provider have been notified via electronic communication.  ? ?Jane Davidson, RMA ?Care Guide, Embedded Care Coordination ?[facility]  ?[place], [place] [address] ?Direct Dial: [phone] ?[email or web address] ?Website: [email or web address]  ? ?

## 2022-01-10 ENCOUNTER — Telehealth: Payer: PPO

## 2022-01-31 ENCOUNTER — Encounter: Payer: Self-pay | Admitting: Internal Medicine

## 2022-01-31 ENCOUNTER — Ambulatory Visit (INDEPENDENT_AMBULATORY_CARE_PROVIDER_SITE_OTHER): Payer: PPO | Admitting: Internal Medicine

## 2022-01-31 VITALS — BP 130/60 | HR 78 | Temp 98.6°F | Resp 14 | Ht 64.0 in | Wt 143.8 lb

## 2022-01-31 DIAGNOSIS — N3000 Acute cystitis without hematuria: Secondary | ICD-10-CM | POA: Diagnosis not present

## 2022-01-31 DIAGNOSIS — R300 Dysuria: Secondary | ICD-10-CM | POA: Diagnosis not present

## 2022-01-31 DIAGNOSIS — D2272 Melanocytic nevi of left lower limb, including hip: Secondary | ICD-10-CM | POA: Diagnosis not present

## 2022-01-31 DIAGNOSIS — Z85828 Personal history of other malignant neoplasm of skin: Secondary | ICD-10-CM | POA: Diagnosis not present

## 2022-01-31 DIAGNOSIS — D225 Melanocytic nevi of trunk: Secondary | ICD-10-CM | POA: Diagnosis not present

## 2022-01-31 DIAGNOSIS — L280 Lichen simplex chronicus: Secondary | ICD-10-CM | POA: Diagnosis not present

## 2022-01-31 DIAGNOSIS — B379 Candidiasis, unspecified: Secondary | ICD-10-CM | POA: Diagnosis not present

## 2022-01-31 DIAGNOSIS — D2271 Melanocytic nevi of right lower limb, including hip: Secondary | ICD-10-CM | POA: Diagnosis not present

## 2022-01-31 DIAGNOSIS — D2262 Melanocytic nevi of left upper limb, including shoulder: Secondary | ICD-10-CM | POA: Diagnosis not present

## 2022-01-31 MED ORDER — FLUCONAZOLE 150 MG PO TABS: 150.0000 mg | ORAL_TABLET | Freq: Once | ORAL | 0 refills | Status: AC

## 2022-01-31 MED ORDER — AMOXICILLIN-POT CLAVULANATE 875-125 MG PO TABS: 1.0000 | ORAL_TABLET | Freq: Two times a day (BID) | ORAL | 0 refills | Status: DC

## 2022-01-31 NOTE — Progress Notes (Signed)
Chief Complaint  ?Patient presents with  ? Urinary Tract Infection  ?  Sx's include burning,frequency,and pain while urinating. Onset x1 day. Treating with otc AZO with minor relief.  ? ?Uti  ?Onset yesterday morning with burning frequency pain with burning tried azo with minor relief of sx's  ? ?Review of Systems  ?Genitourinary:  Positive for dysuria.  ?Past Medical History:  ?Diagnosis Date  ? Abnormal Pap smear of cervix 2001  ? Biopsy normal  ? Bursitis NEC   ? Cancer [facility])   ? skin  ? COPD (chronic obstructive pulmonary disease) ([place])   ? History of peripheral vascular disease 2001  ? s/p bilateral aorto-iliac-femoral bypass /[facility]  ? Hyperlipidemia   ? hypothyroid   ? Osteopenia 2009   ? T scores - 1.5 DEXA 2009  ? Screening for breast cancer May 2012  ? [facility]ogram  ? Tobacco abuse disorder   ? Vitamin D deficiency April 2011  ? replaced withDrsido for level of 11.3 ng/ml  ? Vitamin D deficiency April 2011  ? replaced withDrsido for level of 11.3 ng/ml  ? ?Past Surgical History:  ?Procedure Laterality Date  ? BREAST BIOPSY Left   ? benign  ? CAROTID ENDARTERECTOMY Right 2016  ? Leotis Pain  ? CATARACT EXTRACTION  Sept 2011  ? CHOLECYSTECTOMY    ? HERNIA REPAIR  Jun 2011  ? [name]   ? TUBAL LIGATION    ? ?Family History  ?Problem Relation Age of Onset  ? Hypertension Mother   ? Arthritis Mother   ? Diabetes Father   ? Dementia Sister   ? Breast cancer Neg Hx   ? ?Social History  ? ?Socioeconomic History  ? Marital status: Widowed  ?  Spouse name: Not on file  ? Number of children: Not on file  ? Years of education: Not on file  ? Highest education level: Not on file  ?Occupational History  ? Not on file  ?Tobacco Use  ? Smoking status: Every Day  ?  Packs/day: 1.04  ?  Years: 40.00  ?  Pack years: 41.60  ?  Types: Cigarettes  ? Smokeless tobacco: Never  ?Substance and Sexual Activity  ? Alcohol use: No  ? Drug use: No  ? Sexual activity: Never  ?Other Topics Concern  ? Not on file  ?Social History  Narrative  ? Not on file  ? ?Social Determinants of Health  ? ?Financial Resource Strain: Medium Risk  ? Difficulty of Paying Living Expenses: Somewhat hard  ?Food Insecurity: No Food Insecurity  ? Worried About Charity fundraiser in the Last Year: Never true  ? Ran Out of Food in the Last Year: Never true  ?Transportation Needs: No Transportation Needs  ? Lack of Transportation (Medical): No  ? Lack of Transportation (Non-Medical): No  ?Physical Activity: Not on file  ?Stress: No Stress Concern Present  ? Feeling of Stress : Only a little  ?Social Connections: Unknown  ? Frequency of Communication with Friends and Family: More than three times a week  ? Frequency of Social Gatherings with Friends and Family: Once a week  ? Attends Religious Services: 1 to 4 times per year  ? Active Member of Clubs or Organizations: Yes  ? Attends Archivist Meetings: Not on file  ? Marital Status: Not on file  ?Intimate Partner Violence: Not At Risk  ? Fear of Current or Ex-Partner: No  ? Emotionally Abused: No  ? Physically Abused: No  ?  Sexually Abused: No  ? ?Current Meds  ?Medication Sig  ? amLODipine (NORVASC) 5 MG tablet TAKE 1 TABLET(5 MG) BY MOUTH DAILY  ? amoxicillin-clavulanate (AUGMENTIN) 875-125 MG tablet Take 1 tablet by mouth 2 (two) times daily. With food  ? apixaban (ELIQUIS) 5 MG TABS tablet Take 1 tablet (5 mg total) by mouth 2 (two) times daily.  ? atorvastatin (LIPITOR) 20 MG tablet Take 1 tablet (20 mg total) by mouth daily.  ? Cholecalciferol (VITAMIN D3) 1000 UNITS CAPS Take 1 capsule by mouth daily.  ? clobetasol ointment (TEMOVATE) 9.56 % Apply 1 application topically 2 (two) times daily.  ? fluconazole (DIFLUCAN) 150 MG tablet Take 1 tablet (150 mg total) by mouth once for 1 dose.  ? fluticasone (FLONASE) 50 MCG/ACT nasal spray SHAKE LIQUID AND USE 2 SPRAYS IN EACH NOSTRIL DAILY  ? fluticasone-salmeterol (WIXELA INHUB) 100-50 MCG/ACT AEPB Inhale 1 puff into the lungs 2 (two) times daily.  ?  levothyroxine (SYNTHROID) 88 MCG tablet TAKE 1 TABLET(88 MCG) BY MOUTH DAILY  ? loratadine (CLARITIN) 10 MG tablet Take 1 tablet (10 mg total) by mouth daily.  ? losartan (COZAAR) 100 MG tablet TAKE 1 TABLET(100 MG) BY MOUTH DAILY  ? metoprolol tartrate (LOPRESSOR) 25 MG tablet Take 1 tablet (25 mg total) by mouth 2 (two) times daily.  ? nitroGLYCERIN (NITROSTAT) 0.4 MG SL tablet Place 1 tablet (0.4 mg total) under the tongue every 5 (five) minutes as needed for chest pain.  ? sertraline (ZOLOFT) 50 MG tablet Take 1 tablet (50 mg total) by mouth daily.  ? triamcinolone cream (KENALOG) 0.1 % Apply 1 application topically 2 (two) times daily.  ? ?Allergies  ?Allergen Reactions  ? Pollen Extract   ? Hctz [Hydrochlorothiazide]   ?  Hyponatremia ?  ? Other Cough  ?  Other reaction(s): Other (See Comments) ?Running nose  ? ?No results found for this or any previous visit (from the past 2160 hour(s)). ?Objective  ?Body mass index is 24.68 kg/m?. ?Wt Readings from Last 3 Encounters:  ?01/31/22 143 lb 12.8 oz (65.2 kg)  ?11/12/21 145 lb 9.6 oz (66 kg)  ?10/16/21 146 lb (66.2 kg)  ? ?Temp Readings from Last 3 Encounters:  ?01/31/22 98.6 ?F (37 ?C) (Oral)  ?09/18/21 (!) 96.1 ?F (35.6 ?C) (Temporal)  ?06/19/21 (!) 96.4 ?F (35.8 ?C) (Temporal)  ? ?BP Readings from Last 3 Encounters:  ?01/31/22 130/60  ?11/12/21 (!) 173/84  ?09/18/21 114/62  ? ?Pulse Readings from Last 3 Encounters:  ?01/31/22 78  ?11/12/21 67  ?09/18/21 66  ? ? ?Physical Exam ?Vitals and nursing note reviewed.  ?Constitutional:   ?   Appearance: Normal appearance. She is well-developed and well-groomed.  ?HENT:  ?   Head: Normocephalic and atraumatic.  ?Eyes:  ?   Conjunctiva/sclera: Conjunctivae normal.  ?   Pupils: Pupils are equal, round, and reactive to light.  ?Cardiovascular:  ?   Rate and Rhythm: Normal rate and regular rhythm.  ?   Heart sounds: Normal heart sounds. No murmur heard. ?Pulmonary:  ?   Effort: Pulmonary effort is normal.  ?   Breath sounds:  Normal breath sounds.  ?Abdominal:  ?   General: Abdomen is flat. Bowel sounds are normal.  ?   Tenderness: There is no abdominal tenderness.  ?Musculoskeletal:     ?   General: No tenderness.  ?Skin: ?   General: Skin is warm and dry.  ?Neurological:  ?   General: No focal deficit present.  ?  Mental Status: She is alert and oriented to person, place, and time. Mental status is at baseline.  ?   Cranial Nerves: Cranial nerves 2-12 are intact.  ?   Motor: Motor function is intact.  ?   Coordination: Coordination is intact.  ?   Gait: Gait is intact.  ?Psychiatric:     ?   Attention and Perception: Attention and perception normal.     ?   Mood and Affect: Mood and affect normal.     ?   Speech: Speech normal.     ?   Behavior: Behavior normal. Behavior is cooperative.     ?   Thought Content: Thought content normal.     ?   Cognition and Memory: Cognition and memory normal.     ?   Judgment: Judgment normal.  ? ? ?Assessment  ?Plan  ?Acute cystitis without hematuria - Plan: Urinalysis, Routine w reflex microscopic, Urine Culture, amoxicillin-clavulanate (AUGMENTIN) 875-125 MG tablet bid x 7 days ?Cont azo uti otc  ? ?Dysuria - Plan: amoxicillin-clavulanate (AUGMENTIN) 875-125 MG tablet ? ?Yeast infection - Plan: fluconazole (DIFLUCAN) 150 MG tablet  ?Provider: Dr. Olivia Mackie McLean-Scocuzza-[facility]  ?

## 2022-01-31 NOTE — Patient Instructions (Signed)
Drink plenty of water ? ?Urinary Tract Infection, Adult ? ?A urinary tract infection (UTI) is an infection of any part of the urinary tract. The urinary tract includes the kidneys, ureters, bladder, and urethra. These organs make, store, and get rid of urine in the body. ?An upper UTI affects the ureters and kidneys. A lower UTI affects the bladder and urethra. ?What are the causes? ?Most urinary tract infections are caused by bacteria in your genital area around your urethra, where urine leaves your body. These bacteria grow and cause inflammation of your urinary tract. ?What increases the risk? ?You are more likely to develop this condition if: ?You have a urinary catheter that stays in place. ?You are not able to control when you urinate or have a bowel movement (incontinence). ?You are female and you: ?Use a spermicide or diaphragm for birth control. ?Have low estrogen levels. ?Are pregnant. ?You have certain genes that increase your risk. ?You are sexually active. ?You take antibiotic medicines. ?You have a condition that causes your flow of urine to slow down, such as: ?An enlarged prostate, if you are female. ?Blockage in your urethra. ?A kidney stone. ?A nerve condition that affects your bladder control (neurogenic bladder). ?Not getting enough to drink, or not urinating often. ?You have certain medical conditions, such as: ?Diabetes. ?A weak disease-fighting system (immunesystem). ?Sickle cell disease. ?Gout. ?Spinal cord injury. ?What are the signs or symptoms? ?Symptoms of this condition include: ?Needing to urinate right away (urgency). ?Frequent urination. This may include small amounts of urine each time you urinate. ?Pain or burning with urination. ?Blood in the urine. ?Urine that smells bad or unusual. ?Trouble urinating. ?Cloudy urine. ?Vaginal discharge, if you are female. ?Pain in the abdomen or the lower back. ?You may also have: ?Vomiting or a decreased appetite. ?Confusion. ?Irritability or  tiredness. ?A fever or chills. ?Diarrhea. ?The first symptom in older adults may be confusion. In some cases, they may not have any symptoms until the infection has worsened. ?How is this diagnosed? ?This condition is diagnosed based on your medical history and a physical exam. You may also have other tests, including: ?Urine tests. ?Blood tests. ?Tests for STIs (sexually transmitted infections). ?If you have had more than one UTI, a cystoscopy or imaging studies may be done to determine the cause of the infections. ?How is this treated? ?Treatment for this condition includes: ?Antibiotic medicine. ?Over-the-counter medicines to treat discomfort. ?Drinking enough water to stay hydrated. ?If you have frequent infections or have other conditions such as a kidney stone, you may need to see a health care provider who specializes in the urinary tract (urologist). ?In rare cases, urinary tract infections can cause sepsis. Sepsis is a life-threatening condition that occurs when the body responds to an infection. Sepsis is treated in the hospital with IV antibiotics, fluids, and other medicines. ?Follow these instructions at home: ? ?Medicines ?Take over-the-counter and prescription medicines only as told by your health care provider. ?If you were prescribed an antibiotic medicine, take it as told by your health care provider. Do not stop using the antibiotic even if you start to feel better. ?General instructions ?Make sure you: ?Empty your bladder often and completely. Do not hold urine for long periods of time. ?Empty your bladder after sex. ?Wipe from front to back after urinating or having a bowel movement if you are female. Use each tissue only one time when you wipe. ?Drink enough fluid to keep your urine pale yellow. ?Keep all follow-up visits. This  is important. ?Contact a health care provider if: ?Your symptoms do not get better after 1-2 days. ?Your symptoms go away and then return. ?Get help right away if: ?You  have severe pain in your back or your lower abdomen. ?You have a fever or chills. ?You have nausea or vomiting. ?Summary ?A urinary tract infection (UTI) is an infection of any part of the urinary tract, which includes the kidneys, ureters, bladder, and urethra. ?Most urinary tract infections are caused by bacteria in your genital area. ?Treatment for this condition often includes antibiotic medicines. ?If you were prescribed an antibiotic medicine, take it as told by your health care provider. Do not stop using the antibiotic even if you start to feel better. ?Keep all follow-up visits. This is important. ?This information is not intended to replace advice given to you by your health care provider. Make sure you discuss any questions you have with your health care provider. ?Document Revised: 05/11/2020 Document Reviewed: 05/11/2020 ?Elsevier Patient Education ? [place]. ? ?

## 2022-02-02 LAB — URINALYSIS, ROUTINE W REFLEX MICROSCOPIC
Bilirubin Urine: NEGATIVE
Glucose, UA: NEGATIVE
Hyaline Cast: NONE SEEN /LPF
Ketones, ur: NEGATIVE
Nitrite: POSITIVE — AB
Specific Gravity, Urine: 1.011 (ref 1.001–1.035)
Squamous Epithelial / HPF: NONE SEEN /HPF (ref <=5)
pH: 6.5 (ref 5.0–8.0)

## 2022-02-02 LAB — URINE CULTURE
MICRO NUMBER:: 13296196
SPECIMEN QUALITY:: ADEQUATE

## 2022-02-24 ENCOUNTER — Other Ambulatory Visit: Payer: Self-pay

## 2022-02-24 DIAGNOSIS — E7800 Pure hypercholesterolemia, unspecified: Secondary | ICD-10-CM

## 2022-02-24 MED ORDER — ATORVASTATIN CALCIUM 20 MG PO TABS: 20.0000 mg | ORAL_TABLET | Freq: Every day | ORAL | 3 refills | Status: DC

## 2022-02-24 NOTE — Telephone Encounter (Signed)
Medication has been refilled.

## 2022-02-25 DIAGNOSIS — I700 Atherosclerosis of aorta: Secondary | ICD-10-CM | POA: Diagnosis not present

## 2022-02-25 DIAGNOSIS — I4891 Unspecified atrial fibrillation: Secondary | ICD-10-CM | POA: Diagnosis not present

## 2022-02-25 DIAGNOSIS — J449 Chronic obstructive pulmonary disease, unspecified: Secondary | ICD-10-CM | POA: Diagnosis not present

## 2022-02-25 DIAGNOSIS — I10 Essential (primary) hypertension: Secondary | ICD-10-CM | POA: Diagnosis not present

## 2022-03-11 ENCOUNTER — Other Ambulatory Visit: Payer: Self-pay | Admitting: Internal Medicine

## 2022-03-17 DIAGNOSIS — M7072 Other bursitis of hip, left hip: Secondary | ICD-10-CM | POA: Diagnosis not present

## 2022-03-17 DIAGNOSIS — J449 Chronic obstructive pulmonary disease, unspecified: Secondary | ICD-10-CM | POA: Diagnosis not present

## 2022-03-17 DIAGNOSIS — I739 Peripheral vascular disease, unspecified: Secondary | ICD-10-CM | POA: Diagnosis not present

## 2022-03-17 DIAGNOSIS — D692 Other nonthrombocytopenic purpura: Secondary | ICD-10-CM | POA: Diagnosis not present

## 2022-03-17 DIAGNOSIS — M7071 Other bursitis of hip, right hip: Secondary | ICD-10-CM | POA: Diagnosis not present

## 2022-03-17 DIAGNOSIS — I10 Essential (primary) hypertension: Secondary | ICD-10-CM | POA: Diagnosis not present

## 2022-03-17 DIAGNOSIS — Z720 Tobacco use: Secondary | ICD-10-CM | POA: Diagnosis not present

## 2022-04-07 ENCOUNTER — Telehealth: Payer: Self-pay | Admitting: Internal Medicine

## 2022-04-08 ENCOUNTER — Encounter: Payer: Self-pay | Admitting: Family Medicine

## 2022-04-08 ENCOUNTER — Ambulatory Visit (INDEPENDENT_AMBULATORY_CARE_PROVIDER_SITE_OTHER): Payer: PPO | Admitting: Family Medicine

## 2022-04-08 DIAGNOSIS — M545 Low back pain, unspecified: Secondary | ICD-10-CM

## 2022-04-08 DIAGNOSIS — G8929 Other chronic pain: Secondary | ICD-10-CM

## 2022-04-08 DIAGNOSIS — R3915 Urgency of urination: Secondary | ICD-10-CM

## 2022-04-08 LAB — POCT URINALYSIS DIPSTICK
Bilirubin, UA: NEGATIVE
Blood, UA: NEGATIVE
Glucose, UA: NEGATIVE
Ketones, UA: NEGATIVE
Leukocytes, UA: NEGATIVE
Nitrite, UA: NEGATIVE
Protein, UA: NEGATIVE
Spec Grav, UA: 1.015 (ref 1.010–1.025)
Urobilinogen, UA: 0.2 E.U./dL
pH, UA: 6 (ref 5.0–8.0)

## 2022-04-08 LAB — COMPREHENSIVE METABOLIC PANEL
ALT: 11 U/L (ref 0–35)
AST: 16 U/L (ref 0–37)
Albumin: 3.8 g/dL (ref 3.5–5.2)
Alkaline Phosphatase: 57 U/L (ref 39–117)
BUN: 19 mg/dL (ref 6–23)
CO2: 25 mEq/L (ref 19–32)
Calcium: 8.9 mg/dL (ref 8.4–10.5)
Chloride: 98 mEq/L (ref 96–112)
Creatinine, Ser: 0.93 mg/dL (ref 0.40–1.20)
GFR: 58.46 mL/min — ABNORMAL LOW (ref >60.00)
Glucose, Bld: 94 mg/dL (ref 70–99)
Potassium: 4 mEq/L (ref 3.5–5.1)
Sodium: 129 mEq/L — ABNORMAL LOW (ref 135–145)
Total Bilirubin: 0.5 mg/dL (ref 0.2–1.2)
Total Protein: 6.7 g/dL (ref 6.0–8.3)

## 2022-04-08 LAB — CBC WITH DIFFERENTIAL/PLATELET
Basophils Absolute: 0.1 10*3/uL (ref 0.0–0.1)
Basophils Relative: 0.7 % (ref 0.0–3.0)
Eosinophils Absolute: 0.1 10*3/uL (ref 0.0–0.7)
Eosinophils Relative: 0.8 % (ref 0.0–5.0)
HCT: 33.6 % — ABNORMAL LOW (ref 36.0–46.0)
Hemoglobin: 11.2 g/dL — ABNORMAL LOW (ref 12.0–15.0)
Lymphocytes Relative: 11.8 % — ABNORMAL LOW (ref 12.0–46.0)
Lymphs Abs: 0.9 10*3/uL (ref 0.7–4.0)
MCHC: 33.4 g/dL (ref 30.0–36.0)
MCV: 93.6 fl (ref 78.0–100.0)
Monocytes Absolute: 0.6 10*3/uL (ref 0.1–1.0)
Monocytes Relative: 7.9 % (ref 3.0–12.0)
Neutro Abs: 6.1 10*3/uL (ref 1.4–7.7)
Neutrophils Relative %: 78.8 % — ABNORMAL HIGH (ref 43.0–77.0)
Platelets: 239 10*3/uL (ref 150.0–400.0)
RBC: 3.59 Mil/uL — ABNORMAL LOW (ref 3.87–5.11)
RDW: 14 % (ref 11.5–15.5)
WBC: 7.7 10*3/uL (ref 4.0–10.5)

## 2022-04-08 NOTE — Progress Notes (Signed)
Tommi Rumps, MD Phone: [phone]  Jane Davidson is a 81 y.o. female who presents today for same day visit.   Back pain/urinary urgency: Patient notes her low back has been bothering her for 2 to 3 weeks.  It occasionally radiates to her thighs.  She describes it as an aching discomfort.  No numbness or weakness.  She has chronic urinary urgency and intermittent incontinence which may have worsened over the last 2 weeks.  Notes she wears a pad at night to help with this.  She notes no back injury.  She has been taking some Tylenol.  She notes no bowel incontinence.  No vaginal discharge.  No hematuria.  She does have some dysuria and some suprapubic discomfort.  She reports she was recently treated for UTI.  The patient additionally notes several weeks ago she got a bag of chocolates from the [employer].  She notes she found some kind of droppings inside the bag that she did not notice until she had eaten several of the pieces of chocolate.  She is had no vomiting or diarrhea.  She does note some nausea.  Social History   Tobacco Use  Smoking Status Every Day   Packs/day: 1.04   Years: 40.00   Total pack years: 41.60   Types: Cigarettes  Smokeless Tobacco Never    Current Outpatient Medications on File Prior to Visit  Medication Sig Dispense Refill   amLODipine (NORVASC) 5 MG tablet TAKE 1 TABLET(5 MG) BY MOUTH DAILY 90 tablet 1   amoxicillin-clavulanate (AUGMENTIN) 875-125 MG tablet Take 1 tablet by mouth 2 (two) times daily. With food 14 tablet 0   apixaban (ELIQUIS) 5 MG TABS tablet Take 1 tablet (5 mg total) by mouth 2 (two) times daily. 60 tablet 0   atorvastatin (LIPITOR) 20 MG tablet Take 1 tablet (20 mg total) by mouth daily. 90 tablet 3   Cholecalciferol (VITAMIN D3) 1000 UNITS CAPS Take 1 capsule by mouth daily.     clobetasol ointment (TEMOVATE) 8.85 % Apply 1 application topically 2 (two) times daily. 30 g 5   fluticasone (FLONASE) 50 MCG/ACT nasal spray SHAKE LIQUID  AND USE 2 SPRAYS IN EACH NOSTRIL DAILY 16 g 2   fluticasone-salmeterol (WIXELA INHUB) 100-50 MCG/ACT AEPB Inhale 1 puff into the lungs 2 (two) times daily. 180 each 1   levothyroxine (SYNTHROID) 88 MCG tablet TAKE 1 TABLET(88 MCG) BY MOUTH DAILY 90 tablet 3   loratadine (CLARITIN) 10 MG tablet Take 1 tablet (10 mg total) by mouth daily. 30 tablet 11   losartan (COZAAR) 100 MG tablet TAKE 1 TABLET(100 MG) BY MOUTH DAILY 90 tablet 1   metoprolol tartrate (LOPRESSOR) 25 MG tablet Take 1 tablet (25 mg total) by mouth 2 (two) times daily. 60 tablet 0   nitroGLYCERIN (NITROSTAT) 0.4 MG SL tablet Place 1 tablet (0.4 mg total) under the tongue every 5 (five) minutes as needed for chest pain. 50 tablet 3   sertraline (ZOLOFT) 50 MG tablet Take 1 tablet (50 mg total) by mouth daily. 90 tablet 1   triamcinolone cream (KENALOG) 0.1 % Apply 1 application topically 2 (two) times daily. 30 g 0   Influenza vac split quadrivalent PF (FLUZONE HIGH-DOSE) 0.5 ML injection      nicotine (NICODERM CQ - DOSED IN MG/24 HOURS) 14 mg/24hr patch Place 1 patch (14 mg total) onto the skin daily. (Patient not taking: Reported on 04/08/2022) 28 patch 2   nicotine (NICODERM CQ - DOSED IN MG/24 HOURS)  21 mg/24hr patch Place 1 patch (21 mg total) onto the skin daily. (Patient not taking: Reported on 04/08/2022) 28 patch 2   nicotine polacrilex (NICORETTE) 4 MG gum Take 1 each (4 mg total) by mouth as needed for smoking cessation. Max dose 24 pieces per day (Patient not taking: Reported on 04/08/2022) 100 tablet 5   SHINGRIX injection      TENIVAC 5-2 LFU injection      Terbinafine HCl (LAMISIL PO)      traMADol (ULTRAM) 50 MG tablet Take 1 tablet(s) PO Q 6 HOURS prn pain     No current facility-administered medications on file prior to visit.     ROS see history of present illness  Objective  Physical Exam Vitals:   04/08/22 1320  BP: 118/70  Pulse: 77  Temp: 98 F (36.7 C)  SpO2: 99%    BP Readings from Last 3  Encounters:  04/08/22 118/70  01/31/22 130/60  11/12/21 (!) 173/84   Wt Readings from Last 3 Encounters:  04/08/22 144 lb 3.2 oz (65.4 kg)  01/31/22 143 lb 12.8 oz (65.2 kg)  11/12/21 145 lb 9.6 oz (66 kg)    Physical Exam Constitutional:      General: She is not in acute distress.    Appearance: She is not diaphoretic.  Cardiovascular:     Rate and Rhythm: Normal rate and regular rhythm.     Heart sounds: Normal heart sounds.  Pulmonary:     Effort: Pulmonary effort is normal.     Breath sounds: Normal breath sounds.  Abdominal:     General: Bowel sounds are normal. There is no distension.     Palpations: Abdomen is soft.     Tenderness: There is abdominal tenderness (Slight suprapubic discomfort on palpation).  Musculoskeletal:     Comments: No midline spine tenderness, no midline spine step-off, there is lumbar muscular back tenderness bilaterally, no overlying skin changes  Skin:    General: Skin is warm and dry.  Neurological:     Mental Status: She is alert.     Comments: 5/5 strength bilateral quads, hamstrings, plantarflexion, and dorsiflexion, sensation to light touch intact bilateral lower extremities, 2+ patellar reflexes      Assessment/Plan: Please see individual problem list.  Problem List Items Addressed This Visit     Chronic low back pain without sciatica    Possible exacerbation of chronic low back pain as she does have some muscular tenderness on exam.  It is also possible that this could be related to a UTI.  She is neurologically intact in the lower extremities.  We are checking a urinalysis.  For UTI we will treat for that.  If negative we will treat for muscular back pain.      Relevant Medications   traMADol (ULTRAM) 50 MG tablet   Urinary urgency    Discussed that she may have a UTI that is causing most of her symptoms.  We will check a urinalysis.  We will treat if it appears positive for infection.  Checking labs as outlined given potential  exposure to animal droppings.  Patient was advised to seek medical attention for fevers, hematuria, numbness, weakness, or worsening pain in her back.      Relevant Orders   POCT Urinalysis Dipstick   Urine Culture   CBC w/Diff   Comp Met (CMET)    Return if symptoms worsen or fail to improve.   Tommi Rumps, MD [place]

## 2022-04-09 LAB — URINE CULTURE
MICRO NUMBER:: 13577985
SPECIMEN QUALITY:: ADEQUATE

## 2022-04-10 ENCOUNTER — Other Ambulatory Visit: Payer: Self-pay | Admitting: Internal Medicine

## 2022-04-11 ENCOUNTER — Other Ambulatory Visit (INDEPENDENT_AMBULATORY_CARE_PROVIDER_SITE_OTHER): Payer: PPO

## 2022-04-11 DIAGNOSIS — D649 Anemia, unspecified: Secondary | ICD-10-CM

## 2022-04-11 LAB — SODIUM: Sodium: 133 mEq/L — ABNORMAL LOW (ref 135–145)

## 2022-04-11 LAB — CBC WITH DIFFERENTIAL/PLATELET
Basophils Absolute: 0.1 10*3/uL (ref 0.0–0.1)
Basophils Relative: 0.8 % (ref 0.0–3.0)
Eosinophils Absolute: 0 10*3/uL (ref 0.0–0.7)
Eosinophils Relative: 0.3 % (ref 0.0–5.0)
HCT: 34.1 % — ABNORMAL LOW (ref 36.0–46.0)
Hemoglobin: 11.5 g/dL — ABNORMAL LOW (ref 12.0–15.0)
Lymphocytes Relative: 10.8 % — ABNORMAL LOW (ref 12.0–46.0)
Lymphs Abs: 0.7 10*3/uL (ref 0.7–4.0)
MCHC: 33.6 g/dL (ref 30.0–36.0)
MCV: 93.2 fl (ref 78.0–100.0)
Monocytes Absolute: 0.5 10*3/uL (ref 0.1–1.0)
Monocytes Relative: 8.4 % (ref 3.0–12.0)
Neutro Abs: 5.2 10*3/uL (ref 1.4–7.7)
Neutrophils Relative %: 79.7 % — ABNORMAL HIGH (ref 43.0–77.0)
Platelets: 241 10*3/uL (ref 150.0–400.0)
RBC: 3.65 Mil/uL — ABNORMAL LOW (ref 3.87–5.11)
RDW: 13.4 % (ref 11.5–15.5)
WBC: 6.6 10*3/uL (ref 4.0–10.5)

## 2022-04-11 LAB — IBC + FERRITIN
Ferritin: 45.7 ng/mL (ref 10.0–291.0)
Iron: 95 ug/dL (ref 42–145)
Saturation Ratios: 25.4 % (ref 20.0–50.0)
TIBC: 373.8 ug/dL (ref 250.0–450.0)
Transferrin: 267 mg/dL (ref 212.0–360.0)

## 2022-04-11 LAB — VITAMIN B12: Vitamin B-12: 544 pg/mL (ref 211–911)

## 2022-04-14 ENCOUNTER — Encounter: Payer: Self-pay | Admitting: *Deleted

## 2022-04-14 ENCOUNTER — Other Ambulatory Visit: Payer: Self-pay | Admitting: *Deleted

## 2022-04-14 DIAGNOSIS — E871 Hypo-osmolality and hyponatremia: Secondary | ICD-10-CM

## 2022-04-14 DIAGNOSIS — D649 Anemia, unspecified: Secondary | ICD-10-CM

## 2022-04-16 ENCOUNTER — Other Ambulatory Visit: Payer: Self-pay | Admitting: Internal Medicine

## 2022-04-16 DIAGNOSIS — Z1231 Encounter for screening mammogram for malignant neoplasm of breast: Secondary | ICD-10-CM

## 2022-04-21 ENCOUNTER — Encounter: Payer: Self-pay | Admitting: Family Medicine

## 2022-04-21 ENCOUNTER — Ambulatory Visit (INDEPENDENT_AMBULATORY_CARE_PROVIDER_SITE_OTHER): Payer: PPO | Admitting: Family Medicine

## 2022-04-21 VITALS — BP 130/70 | HR 70 | Temp 98.3°F | Ht 64.0 in | Wt 144.6 lb

## 2022-04-21 DIAGNOSIS — W57XXXA Bitten or stung by nonvenomous insect and other nonvenomous arthropods, initial encounter: Secondary | ICD-10-CM

## 2022-04-21 DIAGNOSIS — S0006XA Insect bite (nonvenomous) of scalp, initial encounter: Secondary | ICD-10-CM | POA: Insufficient documentation

## 2022-04-21 MED ORDER — DOXYCYCLINE HYCLATE 100 MG PO TABS: 200.0000 mg | ORAL_TABLET | Freq: Once | ORAL | 0 refills | Status: AC

## 2022-04-21 NOTE — Progress Notes (Signed)
Tommi Rumps, MD Phone: [phone]  Jane Davidson is a 81 y.o. female who presents today for same day visit.   Tick bite: Patient reports she remove this Friday evening.  She is unsure how long it was attached for.  She has no fever or rash.  She did have some itching around the site and over her arms.  She used Benadryl and a topical anti-itch cream for that with good benefit.  She brings the tick in today and it appears to be brown.  Social History   Tobacco Use  Smoking Status Every Day   Packs/day: 1.04   Years: 40.00   Total pack years: 41.60   Types: Cigarettes  Smokeless Tobacco Never    Current Outpatient Medications on File Prior to Visit  Medication Sig Dispense Refill   amLODipine (NORVASC) 5 MG tablet TAKE 1 TABLET(5 MG) BY MOUTH DAILY 90 tablet 1   apixaban (ELIQUIS) 5 MG TABS tablet Take 1 tablet (5 mg total) by mouth 2 (two) times daily. 60 tablet 0   atorvastatin (LIPITOR) 20 MG tablet Take 1 tablet (20 mg total) by mouth daily. 90 tablet 3   Cholecalciferol (VITAMIN D3) 1000 UNITS CAPS Take 1 capsule by mouth daily.     clobetasol ointment (TEMOVATE) 4.03 % Apply 1 application topically 2 (two) times daily. 30 g 5   fluticasone (FLONASE) 50 MCG/ACT nasal spray SHAKE LIQUID AND USE 2 SPRAYS IN EACH NOSTRIL DAILY 16 g 2   fluticasone-salmeterol (WIXELA INHUB) 100-50 MCG/ACT AEPB Inhale 1 puff into the lungs 2 (two) times daily. 180 each 1   Influenza vac split quadrivalent PF (FLUZONE HIGH-DOSE) 0.5 ML injection      levothyroxine (SYNTHROID) 88 MCG tablet TAKE 1 TABLET(88 MCG) BY MOUTH DAILY 90 tablet 3   loratadine (CLARITIN) 10 MG tablet Take 1 tablet (10 mg total) by mouth daily. 30 tablet 11   losartan (COZAAR) 100 MG tablet TAKE 1 TABLET(100 MG) BY MOUTH DAILY 90 tablet 1   metoprolol tartrate (LOPRESSOR) 25 MG tablet Take 1 tablet (25 mg total) by mouth 2 (two) times daily. 60 tablet 0   nitroGLYCERIN (NITROSTAT) 0.4 MG SL tablet Place 1 tablet (0.4 mg  total) under the tongue every 5 (five) minutes as needed for chest pain. 50 tablet 3   sertraline (ZOLOFT) 50 MG tablet Take 1 tablet (50 mg total) by mouth daily. 90 tablet 1   SHINGRIX injection      TENIVAC 5-2 LFU injection      triamcinolone cream (KENALOG) 0.1 % Apply 1 application topically 2 (two) times daily. 30 g 0   nicotine (NICODERM CQ - DOSED IN MG/24 HOURS) 14 mg/24hr patch Place 1 patch (14 mg total) onto the skin daily. (Patient not taking: Reported on 04/21/2022) 28 patch 2   nicotine (NICODERM CQ - DOSED IN MG/24 HOURS) 21 mg/24hr patch Place 1 patch (21 mg total) onto the skin daily. (Patient not taking: Reported on 04/21/2022) 28 patch 2   nicotine polacrilex (NICORETTE) 4 MG gum Take 1 each (4 mg total) by mouth as needed for smoking cessation. Max dose 24 pieces per day (Patient not taking: Reported on 04/21/2022) 100 tablet 5   Terbinafine HCl (LAMISIL PO)  (Patient not taking: Reported on 04/21/2022)     traMADol (ULTRAM) 50 MG tablet Take 1 tablet(s) PO Q 6 HOURS prn pain (Patient not taking: Reported on 04/21/2022)     No current facility-administered medications on file prior to visit.  ROS see history of present illness  Objective  Physical Exam Vitals:   04/21/22 1354  BP: 130/70  Pulse: 70  Temp: 98.3 F (36.8 C)  SpO2: 97%    BP Readings from Last 3 Encounters:  04/21/22 130/70  04/08/22 118/70  01/31/22 130/60   Wt Readings from Last 3 Encounters:  04/21/22 144 lb 9.6 oz (65.6 kg)  04/08/22 144 lb 3.2 oz (65.4 kg)  01/31/22 143 lb 12.8 oz (65.2 kg)    Physical Exam Neck:       Assessment/Plan: Please see individual problem list.  Problem List Items Addressed This Visit     Tick bite of scalp - Primary    The tick that she brings in today is somewhat enlarged indicating that it could have been attached for more than 36 hours.  Long it was potentially attached for and that its been less than 72 hours since it was removed we will proceed  with prophylaxis for Lyme disease with doxycycline 200 mg taken once by mouth.  Discussed in general we do not prophylactically treat for [facility] spotted fever and given that she does not have any systemic symptoms I would not treat with a more prolonged course of doxycycline.  The patient is concerned regarding this and we opted to test for [facility] spotted fever and Lyme disease.  She was advised if she developed any fevers, rash, or other new symptoms to let us know sooner so that we can treat with a prolonged course of doxycycline until her testing comes back.      Relevant Medications   doxycycline (VIBRA-TABS) 100 MG tablet   Other Relevant Orders   B. burgdorfi antibodies   [facility] spotted fvr abs pnl(IgG+IgM)    Return if symptoms worsen or fail to improve.   Tommi Rumps, MD [place]

## 2022-04-21 NOTE — Assessment & Plan Note (Signed)
The tick that she brings in today is somewhat enlarged indicating that it could have been attached for more than 36 hours.  Long it was potentially attached for and that its been less than 72 hours since it was removed we will proceed with prophylaxis for Lyme disease with doxycycline 200 mg taken once by mouth.  Discussed in general we do not prophylactically treat for [facility] spotted fever and given that she does not have any systemic symptoms I would not treat with a more prolonged course of doxycycline.  The patient is concerned regarding this and we opted to test for [facility] spotted fever and Lyme disease.  She was advised if she developed any fevers, rash, or other new symptoms to let us know sooner so that we can treat with a prolonged course of doxycycline until her testing comes back.

## 2022-04-21 NOTE — Patient Instructions (Signed)
Nice to see you. We are treating you with doxycycline prophylaxis for Lyme disease given your tick bite.  If you develop fever, rash, or any other new symptoms please let me know so we could extend the doxycycline course.  We will contact you with your lab results.

## 2022-04-24 LAB — ROCKY MTN SPOTTED FVR ABS PNL(IGG+IGM)
RMSF IgG: NOT DETECTED
RMSF IgM: NOT DETECTED

## 2022-04-24 LAB — B. BURGDORFI ANTIBODIES: B burgdorferi Ab IgG+IgM: <0.9 index

## 2022-05-06 ENCOUNTER — Ambulatory Visit
Admission: RE | Admit: 2022-05-06 | Discharge: 2022-05-06 | Disposition: A | Discharge status: Home / Self Care | Payer: PPO | Source: Ambulatory Visit | Attending: Internal Medicine | Admitting: Internal Medicine

## 2022-05-06 DIAGNOSIS — Z1231 Encounter for screening mammogram for malignant neoplasm of breast: Secondary | ICD-10-CM | POA: Diagnosis not present

## 2022-05-07 ENCOUNTER — Other Ambulatory Visit (INDEPENDENT_AMBULATORY_CARE_PROVIDER_SITE_OTHER): Payer: PPO

## 2022-05-07 DIAGNOSIS — E1151 Type 2 diabetes mellitus with diabetic peripheral angiopathy without gangrene: Secondary | ICD-10-CM | POA: Diagnosis not present

## 2022-05-07 DIAGNOSIS — D649 Anemia, unspecified: Secondary | ICD-10-CM | POA: Diagnosis not present

## 2022-05-07 LAB — CBC WITH DIFFERENTIAL/PLATELET
Basophils Absolute: 0 10*3/uL (ref 0.0–0.1)
Basophils Relative: 0.4 % (ref 0.0–3.0)
Eosinophils Absolute: 0 10*3/uL (ref 0.0–0.7)
Eosinophils Relative: 0.3 % (ref 0.0–5.0)
HCT: 34.3 % — ABNORMAL LOW (ref 36.0–46.0)
Hemoglobin: 11.8 g/dL — ABNORMAL LOW (ref 12.0–15.0)
Lymphocytes Relative: 7.8 % — ABNORMAL LOW (ref 12.0–46.0)
Lymphs Abs: 0.6 10*3/uL — ABNORMAL LOW (ref 0.7–4.0)
MCHC: 34.4 g/dL (ref 30.0–36.0)
MCV: 92.1 fl (ref 78.0–100.0)
Monocytes Absolute: 0.6 10*3/uL (ref 0.1–1.0)
Monocytes Relative: 6.9 % (ref 3.0–12.0)
Neutro Abs: 7 10*3/uL (ref 1.4–7.7)
Neutrophils Relative %: 84.6 % — ABNORMAL HIGH (ref 43.0–77.0)
Platelets: 250 10*3/uL (ref 150.0–400.0)
RBC: 3.73 Mil/uL — ABNORMAL LOW (ref 3.87–5.11)
RDW: 13.6 % (ref 11.5–15.5)
WBC: 8.3 10*3/uL (ref 4.0–10.5)

## 2022-05-07 LAB — LIPID PANEL
Cholesterol: 119 mg/dL (ref 0–200)
HDL: 54.6 mg/dL (ref >39.00)
LDL Cholesterol: 49 mg/dL (ref 0–99)
NonHDL: 64.24
Total CHOL/HDL Ratio: 2
Triglycerides: 74 mg/dL (ref 0.0–149.0)
VLDL: 14.8 mg/dL (ref 0.0–40.0)

## 2022-05-07 LAB — COMPREHENSIVE METABOLIC PANEL
ALT: 12 U/L (ref 0–35)
AST: 18 U/L (ref 0–37)
Albumin: 4 g/dL (ref 3.5–5.2)
Alkaline Phosphatase: 57 U/L (ref 39–117)
BUN: 13 mg/dL (ref 6–23)
CO2: 27 mEq/L (ref 19–32)
Calcium: 8.9 mg/dL (ref 8.4–10.5)
Chloride: 98 mEq/L (ref 96–112)
Creatinine, Ser: 0.85 mg/dL (ref 0.40–1.20)
GFR: 65.09 mL/min (ref >60.00)
Glucose, Bld: 111 mg/dL — ABNORMAL HIGH (ref 70–99)
Potassium: 4 mEq/L (ref 3.5–5.1)
Sodium: 130 mEq/L — ABNORMAL LOW (ref 135–145)
Total Bilirubin: 0.5 mg/dL (ref 0.2–1.2)
Total Protein: 6.5 g/dL (ref 6.0–8.3)

## 2022-05-07 LAB — HEMOGLOBIN A1C: Hgb A1c MFr Bld: 5.8 % (ref 4.6–6.5)

## 2022-05-07 LAB — LDL CHOLESTEROL, DIRECT: Direct LDL: 53 mg/dL

## 2022-05-07 NOTE — Addendum Note (Signed)
Addended by: Leeanne Rio on: 05/07/2022 10:50 AM   Modules accepted: Orders

## 2022-05-09 ENCOUNTER — Other Ambulatory Visit: Payer: PPO

## 2022-05-12 DIAGNOSIS — C44729 Squamous cell carcinoma of skin of left lower limb, including hip: Secondary | ICD-10-CM | POA: Diagnosis not present

## 2022-05-12 DIAGNOSIS — D485 Neoplasm of uncertain behavior of skin: Secondary | ICD-10-CM | POA: Diagnosis not present

## 2022-05-20 ENCOUNTER — Other Ambulatory Visit: Payer: Self-pay

## 2022-05-20 MED ORDER — APIXABAN 5 MG PO TABS
5.0000 mg | ORAL_TABLET | Freq: Two times a day (BID) | ORAL | 0 refills | Status: DC
Start: 2022-05-20 — End: 2022-06-22

## 2022-05-20 NOTE — Telephone Encounter (Signed)
Patient states she needs a refill for her apixaban (ELIQUIS) 5 MG TABS tablet.  Patient states Dr. Ubaldo Glassing originally prescribed this medication for her, but he is no longer with the practice.  *Patient states her preferred pharmacy is [facility] on the corner of [address] in [place].

## 2022-05-20 NOTE — Telephone Encounter (Signed)
Prescribed by a different provider but provider is no longer with the practice.   Last OV: 04/21/2022 Next OV: not scheduled

## 2022-06-09 ENCOUNTER — Telehealth: Payer: Self-pay

## 2022-06-09 ENCOUNTER — Encounter: Payer: Self-pay | Admitting: Family Medicine

## 2022-06-09 ENCOUNTER — Ambulatory Visit (INDEPENDENT_AMBULATORY_CARE_PROVIDER_SITE_OTHER): Payer: PPO | Admitting: Family Medicine

## 2022-06-09 VITALS — BP 140/80 | HR 69 | Temp 97.7°F | Ht 64.0 in | Wt 144.0 lb

## 2022-06-09 DIAGNOSIS — R300 Dysuria: Secondary | ICD-10-CM | POA: Diagnosis not present

## 2022-06-09 DIAGNOSIS — R3915 Urgency of urination: Secondary | ICD-10-CM | POA: Diagnosis not present

## 2022-06-09 DIAGNOSIS — L989 Disorder of the skin and subcutaneous tissue, unspecified: Secondary | ICD-10-CM | POA: Insufficient documentation

## 2022-06-09 LAB — URINALYSIS, MICROSCOPIC ONLY: RBC / HPF: NONE SEEN (ref 0–?)

## 2022-06-09 LAB — POCT URINALYSIS DIPSTICK
Bilirubin, UA: NEGATIVE
Blood, UA: POSITIVE
Glucose, UA: NEGATIVE
Ketones, UA: NEGATIVE
Nitrite, UA: NEGATIVE
Protein, UA: POSITIVE — AB
Spec Grav, UA: <=1.005 — AB (ref 1.010–1.025)
Urobilinogen, UA: 0.2 E.U./dL
pH, UA: 6.5 (ref 5.0–8.0)

## 2022-06-09 MED ORDER — CEFADROXIL 500 MG PO CAPS: 500.0000 mg | ORAL_CAPSULE | Freq: Two times a day (BID) | ORAL | 0 refills | Status: DC

## 2022-06-09 NOTE — Assessment & Plan Note (Signed)
Undetermined cause.  She is already following with dermatology for these.  I encouraged her to follow-up with them if the clobetasol is not beneficial.

## 2022-06-09 NOTE — Patient Instructions (Signed)
Please take the cefadroxil for your possible UTI.  We will contact you once your urine culture returns. If your skin sores do not respond to the clobetasol he need to follow-up with dermatology.

## 2022-06-09 NOTE — Telephone Encounter (Signed)
Patient states she would like to have clarification on medication use and test results.  I spoke with Fulton Mole, CMA, and transferred call to her.

## 2022-06-09 NOTE — Progress Notes (Signed)
Tommi Rumps, MD Phone: [phone]  Jane Davidson is a 81 y.o. female who presents today for same day visit.   UTI: symptoms started 2 days ago. Has taken some AZO.  Dysuria- yes  Frequency- yes   Urgency- yes   Hematuria- no   Abd pain- no, though has some nausea   Vaginal d/c- no  Skin sores: Patient notes these pop up occasionally.  She has discussed them with dermatology and reports they advised her to use clobetasol on them.  She notes they did not tell her exactly what they were.   Social History   Tobacco Use  Smoking Status Every Day   Packs/day: 1.04   Years: 40.00   Total pack years: 41.60   Types: Cigarettes  Smokeless Tobacco Never    Current Outpatient Medications on File Prior to Visit  Medication Sig Dispense Refill   amLODipine (NORVASC) 5 MG tablet TAKE 1 TABLET(5 MG) BY MOUTH DAILY 90 tablet 1   apixaban (ELIQUIS) 5 MG TABS tablet Take 1 tablet (5 mg total) by mouth 2 (two) times daily. 60 tablet 0   atorvastatin (LIPITOR) 20 MG tablet Take 1 tablet (20 mg total) by mouth daily. 90 tablet 3   Cholecalciferol (VITAMIN D3) 1000 UNITS CAPS Take 1 capsule by mouth daily.     clobetasol ointment (TEMOVATE) 1.95 % Apply 1 application topically 2 (two) times daily. 30 g 5   fluticasone (FLONASE) 50 MCG/ACT nasal spray SHAKE LIQUID AND USE 2 SPRAYS IN EACH NOSTRIL DAILY 16 g 2   fluticasone-salmeterol (WIXELA INHUB) 100-50 MCG/ACT AEPB Inhale 1 puff into the lungs 2 (two) times daily. 180 each 1   Influenza vac split quadrivalent PF (FLUZONE HIGH-DOSE) 0.5 ML injection      levothyroxine (SYNTHROID) 88 MCG tablet TAKE 1 TABLET(88 MCG) BY MOUTH DAILY 90 tablet 3   loratadine (CLARITIN) 10 MG tablet Take 1 tablet (10 mg total) by mouth daily. 30 tablet 11   losartan (COZAAR) 100 MG tablet TAKE 1 TABLET(100 MG) BY MOUTH DAILY 90 tablet 1   metoprolol tartrate (LOPRESSOR) 25 MG tablet Take 1 tablet (25 mg total) by mouth 2 (two) times daily. 60 tablet 0    nicotine (NICODERM CQ - DOSED IN MG/24 HOURS) 14 mg/24hr patch Place 1 patch (14 mg total) onto the skin daily. 28 patch 2   nicotine (NICODERM CQ - DOSED IN MG/24 HOURS) 21 mg/24hr patch Place 1 patch (21 mg total) onto the skin daily. 28 patch 2   nicotine polacrilex (NICORETTE) 4 MG gum Take 1 each (4 mg total) by mouth as needed for smoking cessation. Max dose 24 pieces per day 100 tablet 5   nitroGLYCERIN (NITROSTAT) 0.4 MG SL tablet Place 1 tablet (0.4 mg total) under the tongue every 5 (five) minutes as needed for chest pain. 50 tablet 3   sertraline (ZOLOFT) 50 MG tablet Take 1 tablet (50 mg total) by mouth daily. 90 tablet 1   SHINGRIX injection      TENIVAC 5-2 LFU injection      Terbinafine HCl (LAMISIL PO)      traMADol (ULTRAM) 50 MG tablet      triamcinolone cream (KENALOG) 0.1 % Apply 1 application topically 2 (two) times daily. 30 g 0   No current facility-administered medications on file prior to visit.     ROS see history of present illness  Objective  Physical Exam Vitals:   06/09/22 1336 06/09/22 1342  BP: (!) 148/84 (!) 140/80  Pulse: 69   Temp: 97.7 F (36.5 C)   SpO2: 97%     BP Readings from Last 3 Encounters:  06/09/22 (!) 140/80  04/21/22 130/70  04/08/22 118/70   Wt Readings from Last 3 Encounters:  06/09/22 144 lb (65.3 kg)  04/21/22 144 lb 9.6 oz (65.6 kg)  04/08/22 144 lb 3.2 oz (65.4 kg)    Physical Exam Pulmonary:     Effort: Pulmonary effort is normal.  Abdominal:     General: Bowel sounds are normal. There is no distension.     Palpations: Abdomen is soft.     Tenderness: There is abdominal tenderness (Slight suprapubic tenderness). There is no guarding.      Assessment/Plan: Please see individual problem list.  Problem List Items Addressed This Visit     Dysuria - Primary    Possibly related to UTI.  We will send her urine for culture and microscopy.  Urine dipstick results could have been affected by her AZO.  We will treat  with cefadroxil 500 mg twice daily for 5 days      Relevant Medications   cefadroxil (DURICEF) 500 MG capsule   Other Relevant Orders   Urine Culture   Urine Microscopic   Skin sore    Undetermined cause.  She is already following with dermatology for these.  I encouraged her to follow-up with them if the clobetasol is not beneficial.      Urinary urgency   Relevant Orders   POCT Urinalysis Dipstick (Completed)      Return if symptoms worsen or fail to improve.   Tommi Rumps, MD [place]

## 2022-06-09 NOTE — Assessment & Plan Note (Signed)
Possibly related to UTI.  We will send her urine for culture and microscopy.  Urine dipstick results could have been affected by her AZO.  We will treat with cefadroxil 500 mg twice daily for 5 days

## 2022-06-12 LAB — URINE CULTURE
MICRO NUMBER:: 13840459
SPECIMEN QUALITY:: ADEQUATE

## 2022-06-12 NOTE — Telephone Encounter (Signed)
Patient returned office phone and note was read.

## 2022-06-13 NOTE — Telephone Encounter (Signed)
noted 

## 2022-06-22 ENCOUNTER — Other Ambulatory Visit: Payer: Self-pay | Admitting: Internal Medicine

## 2022-07-02 DIAGNOSIS — C44729 Squamous cell carcinoma of skin of left lower limb, including hip: Secondary | ICD-10-CM | POA: Diagnosis not present

## 2022-07-07 DIAGNOSIS — Z48817 Encounter for surgical aftercare following surgery on the skin and subcutaneous tissue: Secondary | ICD-10-CM | POA: Diagnosis not present

## 2022-07-09 ENCOUNTER — Other Ambulatory Visit: Payer: Self-pay | Admitting: Internal Medicine

## 2022-07-21 ENCOUNTER — Other Ambulatory Visit: Payer: Self-pay | Admitting: Family

## 2022-07-21 ENCOUNTER — Other Ambulatory Visit: Payer: Self-pay | Admitting: Internal Medicine

## 2022-07-25 ENCOUNTER — Other Ambulatory Visit: Payer: Self-pay | Admitting: Internal Medicine

## 2022-07-25 ENCOUNTER — Other Ambulatory Visit: Payer: Self-pay

## 2022-07-25 MED ORDER — APIXABAN 5 MG PO TABS: ORAL_TABLET | ORAL | 5 refills | Status: DC

## 2022-07-25 MED ORDER — METOPROLOL TARTRATE 25 MG PO TABS: 25.0000 mg | ORAL_TABLET | Freq: Two times a day (BID) | ORAL | 5 refills | Status: DC

## 2022-07-25 NOTE — Telephone Encounter (Signed)
Not filled at our office. Filled by cardiologist at [facility]

## 2022-07-25 NOTE — Telephone Encounter (Signed)
Patient states pharmacy sent Dr. [name] a request for a refill for her metoprolol tartrate (LOPRESSOR) 25 MG tablet and pharmacy states Dr. Derrel Nip denied the request.  Patient states she would like to know why it was denied.  Patient states pharmacy told her that they also sent a request to Dr. Derrel Nip for ELIQUIS 5 MG TABS tablet, but they have not heard back from her.  *Patient states her preferred pharmacy is [facility] on the corner of [address].

## 2022-07-28 ENCOUNTER — Other Ambulatory Visit: Payer: Self-pay | Admitting: Internal Medicine

## 2022-07-28 NOTE — Telephone Encounter (Signed)
Pt stated that her cardiologist has moved and she will not see another one until November.

## 2022-07-28 NOTE — Telephone Encounter (Signed)
noted 

## 2022-08-11 ENCOUNTER — Encounter (INDEPENDENT_AMBULATORY_CARE_PROVIDER_SITE_OTHER): Payer: Self-pay

## 2022-08-28 DIAGNOSIS — J449 Chronic obstructive pulmonary disease, unspecified: Secondary | ICD-10-CM | POA: Diagnosis not present

## 2022-08-28 DIAGNOSIS — I480 Paroxysmal atrial fibrillation: Secondary | ICD-10-CM | POA: Diagnosis not present

## 2022-08-28 DIAGNOSIS — Z720 Tobacco use: Secondary | ICD-10-CM | POA: Diagnosis not present

## 2022-08-28 DIAGNOSIS — E1151 Type 2 diabetes mellitus with diabetic peripheral angiopathy without gangrene: Secondary | ICD-10-CM | POA: Diagnosis not present

## 2022-08-28 DIAGNOSIS — I10 Essential (primary) hypertension: Secondary | ICD-10-CM | POA: Diagnosis not present

## 2022-08-28 DIAGNOSIS — I6529 Occlusion and stenosis of unspecified carotid artery: Secondary | ICD-10-CM | POA: Diagnosis not present

## 2022-08-28 DIAGNOSIS — E785 Hyperlipidemia, unspecified: Secondary | ICD-10-CM | POA: Diagnosis not present

## 2022-09-07 ENCOUNTER — Other Ambulatory Visit: Payer: Self-pay | Admitting: Internal Medicine

## 2022-09-18 DIAGNOSIS — J449 Chronic obstructive pulmonary disease, unspecified: Secondary | ICD-10-CM | POA: Diagnosis not present

## 2022-09-18 DIAGNOSIS — I10 Essential (primary) hypertension: Secondary | ICD-10-CM | POA: Diagnosis not present

## 2022-09-18 DIAGNOSIS — D692 Other nonthrombocytopenic purpura: Secondary | ICD-10-CM | POA: Diagnosis not present

## 2022-09-26 ENCOUNTER — Telehealth: Payer: Self-pay

## 2022-09-26 NOTE — Telephone Encounter (Signed)
Pt called in stating she has left hip pan.  Pt does not know if its due to her prior surgery on her leg that is causing the hip pain but she has been taking tylenol and it helps but only for a little and then it come right back.   Pt was scheduled in the earliest open slot for Dr. Derrel Nip which was 10-10-22. Pt stated she would like to know what she should do if the pain comes and its unbearable and she can not wait that long. I advised she can go to an urgent care for the unbearable pain, as she declined the offer being scheduled at [facility] or anywhere else.

## 2022-09-26 NOTE — Telephone Encounter (Signed)
Spoke with patient and suggested that she could also got to [facility] if pain is that bad. Pt will also get appt with Dr. Derrel Nip on 10/10/22.

## 2022-09-29 DIAGNOSIS — M7062 Trochanteric bursitis, left hip: Secondary | ICD-10-CM | POA: Diagnosis not present

## 2022-10-07 ENCOUNTER — Other Ambulatory Visit: Payer: Self-pay | Admitting: Internal Medicine

## 2022-10-10 ENCOUNTER — Ambulatory Visit: Payer: PPO | Admitting: Internal Medicine

## 2022-10-15 ENCOUNTER — Ambulatory Visit (INDEPENDENT_AMBULATORY_CARE_PROVIDER_SITE_OTHER): Payer: PPO | Admitting: Internal Medicine

## 2022-10-15 ENCOUNTER — Encounter: Payer: Self-pay | Admitting: Internal Medicine

## 2022-10-15 VITALS — BP 136/68 | HR 64 | Temp 97.6°F | Ht 64.0 in | Wt 140.8 lb

## 2022-10-15 DIAGNOSIS — M5416 Radiculopathy, lumbar region: Secondary | ICD-10-CM | POA: Diagnosis not present

## 2022-10-15 DIAGNOSIS — D6869 Other thrombophilia: Secondary | ICD-10-CM | POA: Diagnosis not present

## 2022-10-15 DIAGNOSIS — G8929 Other chronic pain: Secondary | ICD-10-CM

## 2022-10-15 DIAGNOSIS — E1151 Type 2 diabetes mellitus with diabetic peripheral angiopathy without gangrene: Secondary | ICD-10-CM | POA: Diagnosis not present

## 2022-10-15 DIAGNOSIS — I700 Atherosclerosis of aorta: Secondary | ICD-10-CM | POA: Diagnosis not present

## 2022-10-15 DIAGNOSIS — M5432 Sciatica, left side: Secondary | ICD-10-CM | POA: Diagnosis not present

## 2022-10-15 DIAGNOSIS — M8580 Other specified disorders of bone density and structure, unspecified site: Secondary | ICD-10-CM

## 2022-10-15 DIAGNOSIS — I10 Essential (primary) hypertension: Secondary | ICD-10-CM | POA: Diagnosis not present

## 2022-10-15 DIAGNOSIS — Z780 Asymptomatic menopausal state: Secondary | ICD-10-CM | POA: Diagnosis not present

## 2022-10-15 DIAGNOSIS — E7800 Pure hypercholesterolemia, unspecified: Secondary | ICD-10-CM

## 2022-10-15 DIAGNOSIS — M25552 Pain in left hip: Secondary | ICD-10-CM | POA: Diagnosis not present

## 2022-10-15 LAB — COMPREHENSIVE METABOLIC PANEL
ALT: 12 U/L (ref 0–35)
AST: 17 U/L (ref 0–37)
Albumin: 3.9 g/dL (ref 3.5–5.2)
Alkaline Phosphatase: 53 U/L (ref 39–117)
BUN: 17 mg/dL (ref 6–23)
CO2: 27 mEq/L (ref 19–32)
Calcium: 8.9 mg/dL (ref 8.4–10.5)
Chloride: 101 mEq/L (ref 96–112)
Creatinine, Ser: 0.76 mg/dL (ref 0.40–1.20)
GFR: 74.21 mL/min (ref >60.00)
Glucose, Bld: 103 mg/dL — ABNORMAL HIGH (ref 70–99)
Potassium: 4.1 mEq/L (ref 3.5–5.1)
Sodium: 135 mEq/L (ref 135–145)
Total Bilirubin: 0.5 mg/dL (ref 0.2–1.2)
Total Protein: 6.4 g/dL (ref 6.0–8.3)

## 2022-10-15 LAB — MICROALBUMIN / CREATININE URINE RATIO
Creatinine,U: 115.2 mg/dL
Microalb Creat Ratio: 1.5 mg/g (ref 0.0–30.0)
Microalb, Ur: 1.7 mg/dL (ref 0.0–1.9)

## 2022-10-15 LAB — LIPID PANEL
Cholesterol: 115 mg/dL (ref 0–200)
HDL: 53.6 mg/dL (ref >39.00)
LDL Cholesterol: 48 mg/dL (ref 0–99)
NonHDL: 61.69
Total CHOL/HDL Ratio: 2
Triglycerides: 70 mg/dL (ref 0.0–149.0)
VLDL: 14 mg/dL (ref 0.0–40.0)

## 2022-10-15 LAB — HEMOGLOBIN A1C: Hgb A1c MFr Bld: 6 % (ref 4.6–6.5)

## 2022-10-15 LAB — LDL CHOLESTEROL, DIRECT: Direct LDL: 49 mg/dL

## 2022-10-15 MED ORDER — AMLODIPINE BESYLATE 10 MG PO TABS: 10.0000 mg | ORAL_TABLET | Freq: Every day | ORAL | 0 refills | Status: DC

## 2022-10-15 MED ORDER — TRAMADOL HCL 50 MG PO TABS: 50.0000 mg | ORAL_TABLET | Freq: Four times a day (QID) | ORAL | 0 refills | Status: AC | PRN

## 2022-10-15 MED ORDER — NITROGLYCERIN 0.4 MG SL SUBL: 0.4000 mg | SUBLINGUAL_TABLET | SUBLINGUAL | 3 refills | Status: AC | PRN

## 2022-10-15 NOTE — Patient Instructions (Addendum)
Your left leg pain appears to be due to your lumbar spinal stenosis,  not your hip  Physical therapy referral to [facility] in progress  Continue taking 1000 mg tylenol every 12 hours  Add tramadol for additional pain control every 6 hours or less often.  Call for refills  You are due for a bone density test.  Your bone density test  been ordered.  Please call to make your appointment at [place] [phone]

## 2022-10-15 NOTE — Assessment & Plan Note (Signed)
Worsening

## 2022-10-15 NOTE — Assessment & Plan Note (Signed)
Not at goal.  Increase amlodipine to 10 mg daily

## 2022-10-15 NOTE — Progress Notes (Signed)
Subjective:  Patient ID: Jane Davidson, female    DOB: October 14, 1940  Age: 82 y.o. MRN: [id number]  CC: The primary encounter diagnosis was Essential hypertension. Diagnoses of Type 2 diabetes mellitus with diabetic peripheral angiopathy without gangrene, without long-term current use of insulin ([place]), Pure hypercholesterolemia, Chronic left hip pain, Chronic left-sided lumbar radiculopathy, Osteopenia after menopause, Acquired thrombophilia ([place]), Aortic atherosclerosis ([place]), and Left sided sciatica were also pertinent to this visit.   HPI OKIE JANSSON presents for  follow up on multiple issues     Chief Complaint  Patient presents with   Hip Pain    Left hip pain    82 yr old female with history of type 2 DM,  tCOPD/tobacco abuse, aortic atherosclerosis, PAF on Eliquis d  deficiency , lumbar spinal stenosis with radiculopathy , and osteopenia    1) left hip pain: chronic  since 2020.  Had basal cell CA removed from left shin and left hip has been more painful since then. Saw [facility]  for evaluation , x rays were repeated.  She was told she had arthritis or bursitis .  Not given any medications,  bc she takes Eliquis.  told to continue taking tylenol as needed up to 2000 tylenol. And given some exercises to do.  Pain is worse with weight bearing and changes in position . The pain starts in the SI joint and radiates to thigh with walking   2) Type 2 DM: diet controlled . Does not check blood sugars.   3) aortic atherosclerosis:  taking atorvastatin   4) HTN:  checking at home with a wrist cuff.   Readings have been checked only in the evening,  systolics have been as high as 176 as recently as one week ago during a period of feeling tight in the chest .  She did not seek medical attention and did not have any NTG to take.  Attributes chest tightness to late timing of evening medications   Outpatient Medications Prior to Visit  Medication Sig Dispense Refill   apixaban  (ELIQUIS) 5 MG TABS tablet TAKE 1 TABLET(5 MG) BY MOUTH TWICE DAILY 60 tablet 5   atorvastatin (LIPITOR) 20 MG tablet Take 1 tablet (20 mg total) by mouth daily. 90 tablet 3   Cholecalciferol (VITAMIN D3) 1000 UNITS CAPS Take 1 capsule by mouth daily.     clobetasol ointment (TEMOVATE) 0.94 % Apply 1 application topically 2 (two) times daily. 30 g 5   fluticasone (FLONASE) 50 MCG/ACT nasal spray SHAKE LIQUID AND USE 2 SPRAYS IN EACH NOSTRIL DAILY 48 g 0   fluticasone-salmeterol (WIXELA INHUB) 100-50 MCG/ACT AEPB Inhale 1 puff into the lungs 2 (two) times daily. 180 each 1   levothyroxine (SYNTHROID) 88 MCG tablet TAKE 1 TABLET(88 MCG) BY MOUTH DAILY 90 tablet 3   loratadine (CLARITIN) 10 MG tablet Take 1 tablet (10 mg total) by mouth daily. 30 tablet 11   losartan (COZAAR) 100 MG tablet TAKE 1 TABLET(100 MG) BY MOUTH DAILY 90 tablet 0   metoprolol tartrate (LOPRESSOR) 25 MG tablet Take 1 tablet (25 mg total) by mouth 2 (two) times daily. 60 tablet 5   nicotine (NICODERM CQ - DOSED IN MG/24 HOURS) 14 mg/24hr patch Place 1 patch (14 mg total) onto the skin daily. 28 patch 2   nicotine (NICODERM CQ - DOSED IN MG/24 HOURS) 21 mg/24hr patch Place 1 patch (21 mg total) onto the skin daily. 28 patch 2   nicotine polacrilex (NICORETTE)  4 MG gum Take 1 each (4 mg total) by mouth as needed for smoking cessation. Max dose 24 pieces per day 100 tablet 5   sertraline (ZOLOFT) 50 MG tablet TAKE 1 TABLET(50 MG) BY MOUTH DAILY 30 tablet 1   triamcinolone cream (KENALOG) 0.1 % Apply 1 application topically 2 (two) times daily. 30 g 0   amLODipine (NORVASC) 5 MG tablet TAKE 1 TABLET(5 MG) BY MOUTH DAILY 90 tablet 1   nitroGLYCERIN (NITROSTAT) 0.4 MG SL tablet Place 1 tablet (0.4 mg total) under the tongue every 5 (five) minutes as needed for chest pain. 50 tablet 3   Terbinafine HCl (LAMISIL PO)      traMADol (ULTRAM) 50 MG tablet  (Patient not taking: Reported on 10/15/2022)     Influenza vac split quadrivalent PF  (FLUZONE HIGH-DOSE) 0.5 ML injection      SHINGRIX injection      TENIVAC 5-2 LFU injection      No facility-administered medications prior to visit.    Review of Systems;  Patient denies headache, fevers, malaise, unintentional weight loss, skin rash, eye pain, sinus congestion and sinus pain, sore throat, dysphagia,  hemoptysis , cough, dyspnea, wheezing, chest pain, palpitations, orthopnea, edema, abdominal pain, nausea, melena, diarrhea, constipation, flank pain, dysuria, hematuria, urinary  Frequency, nocturia, numbness, tingling, seizures,  Focal weakness, Loss of consciousness,  Tremor, insomnia, depression, anxiety, and suicidal ideation.      Objective:  BP 136/68   Pulse 64   Temp 97.6 F (36.4 C) (Oral)   Ht '5\' 4"'$  (1.626 m)   Wt 140 lb 12.8 oz (63.9 kg)   SpO2 96%   BMI 24.17 kg/m   BP Readings from Last 3 Encounters:  10/15/22 136/68  06/09/22 (!) 140/80  04/21/22 130/70    Wt Readings from Last 3 Encounters:  10/15/22 140 lb 12.8 oz (63.9 kg)  06/09/22 144 lb (65.3 kg)  04/21/22 144 lb 9.6 oz (65.6 kg)    Physical Exam Vitals reviewed.  Constitutional:      General: She is not in acute distress.    Appearance: Normal appearance. She is normal weight. She is not ill-appearing, toxic-appearing or diaphoretic.  HENT:     Head: Normocephalic.  Eyes:     General: No scleral icterus.       Right eye: No discharge.        Left eye: No discharge.     Conjunctiva/sclera: Conjunctivae normal.  Cardiovascular:     Rate and Rhythm: Normal rate and regular rhythm.     Heart sounds: Normal heart sounds.  Pulmonary:     Effort: Pulmonary effort is normal. No respiratory distress.     Breath sounds: Normal breath sounds.  Musculoskeletal:        General: Normal range of motion.     Comments: Positive straight leg lift on left   Skin:    General: Skin is warm and dry.  Neurological:     General: No focal deficit present.     Mental Status: She is alert and  oriented to person, place, and time. Mental status is at baseline.     Deep Tendon Reflexes: Reflexes abnormal.     Comments: LEFT PATELLAR REFLEX HYPER REFLEXIVE   Psychiatric:        Mood and Affect: Mood normal.        Behavior: Behavior normal.        Thought Content: Thought content normal.        Judgment: Judgment  normal.     Lab Results  Component Value Date   HGBA1C 6.0 10/15/2022   HGBA1C 5.8 05/07/2022   HGBA1C 5.9 09/18/2021    Lab Results  Component Value Date   CREATININE 0.76 10/15/2022   CREATININE 0.85 05/07/2022   CREATININE 0.93 04/08/2022    Lab Results  Component Value Date   WBC 8.3 05/07/2022   HGB 11.8 (L) 05/07/2022   HCT 34.3 (L) 05/07/2022   PLT 250.0 05/07/2022   GLUCOSE 103 (H) 10/15/2022   CHOL 115 10/15/2022   TRIG 70.0 10/15/2022   HDL 53.60 10/15/2022   LDLDIRECT 49.0 10/15/2022   LDLCALC 48 10/15/2022   ALT 12 10/15/2022   AST 17 10/15/2022   NA 135 10/15/2022   K 4.1 10/15/2022   CL 101 10/15/2022   CREATININE 0.76 10/15/2022   BUN 17 10/15/2022   CO2 27 10/15/2022   TSH 1.64 09/18/2021   INR 1.0 04/08/2020   HGBA1C 6.0 10/15/2022   MICROALBUR 1.7 10/15/2022    MM 3D SCREEN BREAST BILATERAL  Result Date: 05/07/2022 CLINICAL DATA:  Screening. EXAM: DIGITAL SCREENING BILATERAL MAMMOGRAM WITH TOMOSYNTHESIS AND CAD TECHNIQUE: Bilateral screening digital craniocaudal and mediolateral oblique mammograms were obtained. Bilateral screening digital breast tomosynthesis was performed. The images were evaluated with computer-aided detection. COMPARISON:  Previous exam(s). ACR Breast Density Category c: The breast tissue is heterogeneously dense, which may obscure small masses. FINDINGS: There are no findings suspicious for malignancy. IMPRESSION: No mammographic evidence of malignancy. A result letter of this screening mammogram will be mailed directly to the patient. RECOMMENDATION: Screening mammogram in one year. (Code:SM-B-01Y) BI-RADS  CATEGORY  1: Negative. Electronically Signed   By: Evangeline Dakin M.D.   On: 05/07/2022 16:45    Assessment & Plan:  .Essential hypertension Assessment & Plan: Not at goal.  Increase amlodipine to 10 mg daily   Orders: -     Comprehensive metabolic panel -     Microalbumin / creatinine urine ratio  Type 2 diabetes mellitus with diabetic peripheral angiopathy without gangrene, without long-term current use of insulin ([place]) Assessment & Plan:  She remains  well-controlled on diet alone.  Patient is up-to-date on eye exams and foot exam has been  normal.   Patient has no proteinuria and is tolerating atorvastatin  for CAD risk reduction.    Continue use of daily aspirin  81 mg, statin  And losartan .Encouraged to quit smoking.  Distal pulses are normal  Lab Results  Component Value Date   HGBA1C 6.0 10/15/2022   Lab Results  Component Value Date   MICROALBUR 1.7 10/15/2022     Orders: -     Comprehensive metabolic panel -     Hemoglobin A1c -     Microalbumin / creatinine urine ratio  Pure hypercholesterolemia -     LDL cholesterol, direct -     Lipid panel  Chronic left hip pain Assessment & Plan: Worsening.    Orders: -     Ambulatory referral to Physical Therapy  Chronic left-sided lumbar radiculopathy -     Ambulatory referral to Physical Therapy  Osteopenia after menopause -     DG Bone Density; Future  Acquired thrombophilia ([place]) Assessment & Plan: secondary to atrial fibrillation.  She is tolerating use of Eliquis for embolic stroke risk mitigation due to  atrial fibrillation. Patient has no signs of bleeding and is advised to notify her specialists prior to any procedure that may required suspension of Eliquis  Aortic atherosclerosis ([place]) Assessment & Plan: Reviewed findings of prior CT scan today..  Patient is tolerating medium  potency statin therapy and is willing to try a higher potency statin    Left sided sciatica Assessment & Plan: EXAM  and recent hip films /orthopedic evaluation of hip suggests that the pain is radiculopathic from lumbar spine .  Refilling tramadol, ordering PT   Other orders -     traMADol HCl; Take 1 tablet (50 mg total) by mouth every 6 (six) hours as needed for up to 7 days for moderate pain.  Dispense: 28 tablet; Refill: 0 -     Nitroglycerin; Place 1 tablet (0.4 mg total) under the tongue every 5 (five) minutes as needed for chest pain.  Dispense: 50 tablet; Refill: 3 -     amLODIPine Besylate; Take 1 tablet (10 mg total) by mouth daily.  Dispense: 90 tablet; Refill: 0     I provided 40 minutes of face-to-face time during this encounter reviewing patient's last visit with me, patient's  most recent visit with cardiology,  orthopedics,  recent surgical and non surgical procedures, previous  labs and imaging studies, counseling on currently addressed issues,  and post visit ordering to diagnostics and therapeutics .   Follow-up: Return in about 3 months (around 01/14/2023) for follow up diabetes, chronic pain management.   Crecencio Mc, MD

## 2022-10-15 NOTE — Assessment & Plan Note (Signed)
She remains  well-controlled on diet alone.  Patient is up-to-date on eye exams and foot exam has been  normal.   Patient has no proteinuria and is tolerating atorvastatin  for CAD risk reduction.    Continue use of daily aspirin  81 mg, statin  And losartan .Encouraged to quit smoking.  Distal pulses are normal  Lab Results  Component Value Date   HGBA1C 6.0 10/15/2022   Lab Results  Component Value Date   MICROALBUR 1.7 10/15/2022

## 2022-10-15 NOTE — Assessment & Plan Note (Signed)
EXAM and recent hip films /orthopedic evaluation of hip suggests that the pain is radiculopathic from lumbar spine .  Refilling tramadol, ordering PT

## 2022-10-15 NOTE — Assessment & Plan Note (Signed)
Reviewed findings of prior CT scan today..  Patient is tolerating medium  potency statin therapy and is willing to try a higher potency statin

## 2022-10-15 NOTE — Assessment & Plan Note (Signed)
secondary to atrial fibrillation.  She is tolerating use of Eliquis for embolic stroke risk mitigation due to  atrial fibrillation. Patient has no signs of bleeding and is advised to notify her specialists prior to any procedure that may required suspension of Eliquis  °

## 2022-10-17 ENCOUNTER — Ambulatory Visit (INDEPENDENT_AMBULATORY_CARE_PROVIDER_SITE_OTHER): Payer: PPO

## 2022-10-17 VITALS — Ht 64.0 in | Wt 140.0 lb

## 2022-10-17 DIAGNOSIS — Z0000 Encounter for general adult medical examination without abnormal findings: Secondary | ICD-10-CM | POA: Diagnosis not present

## 2022-10-17 NOTE — Patient Instructions (Addendum)
Jane Davidson , Thank you for taking time to come for your Medicare Wellness Visit. I appreciate your ongoing commitment to your health goals. Please review the following plan we discussed and let me know if I can assist you in the future.   These are the goals we discussed:  Goals       Patient Stated     Quit Smoking (pt-stated)      Not ready to stop smoking today, but will try.  Increase physical activity.        This is a list of the screening recommended for you and due dates:  Health Maintenance  Topic Date Due   Complete foot exam   09/18/2022   COVID-19 Vaccine (4 - 2023-24 season) 11/02/2022*   Screening for Lung Cancer  12/12/2022*   Eye exam for diabetics  10/21/2022   Hemoglobin A1C  04/15/2023   Mammogram  05/07/2023   Yearly kidney function blood test for diabetes  10/16/2023   Yearly kidney health urinalysis for diabetes  10/16/2023   Medicare Annual Wellness Visit  10/18/2023   DTaP/Tdap/Td vaccine (3 - Td or Tdap) 10/23/2031   Pneumonia Vaccine  Completed   Flu Shot  Completed   DEXA scan (bone density measurement)  Completed   Zoster (Shingles) Vaccine  Completed   HPV Vaccine  Aged Out  *Topic was postponed. The date shown is not the original due date.    Advanced directives: End of life planning; Advance aging; Advanced directives discussed.  Copy of current HCPOA/Living Will requested.    Conditions/risks identified: none new.  Next appointment: Follow up in one year for your annual wellness visit    Preventive Care 65 Years and Older, Female Preventive care refers to lifestyle choices and visits with your health care provider that can promote health and wellness. What does preventive care include? A yearly physical exam. This is also called an annual well check. Dental exams once or twice a year. Routine eye exams. Ask your health care provider how often you should have your eyes checked. Personal lifestyle choices, including: Daily care of your  teeth and gums. Regular physical activity. Eating a healthy diet. Avoiding tobacco and drug use. Limiting alcohol use. Practicing safe sex. Taking low-dose aspirin every day. Taking vitamin and mineral supplements as recommended by your health care provider. What happens during an annual well check? The services and screenings done by your health care provider during your annual well check will depend on your age, overall health, lifestyle risk factors, and family history of disease. Counseling  Your health care provider may ask you questions about your: Alcohol use. Tobacco use. Drug use. Emotional well-being. Home and relationship well-being. Sexual activity. Eating habits. History of falls. Memory and ability to understand (cognition). Work and work Statistician. Reproductive health. Screening  You may have the following tests or measurements: Height, weight, and BMI. Blood pressure. Lipid and cholesterol levels. These may be checked every 5 years, or more frequently if you are over 47 years old. Skin check. Lung cancer screening. You may have this screening every year starting at age 59 if you have a 30-pack-year history of smoking and currently smoke or have quit within the past 15 years. Fecal occult blood test (FOBT) of the stool. You may have this test every year starting at age 107. Flexible sigmoidoscopy or colonoscopy. You may have a sigmoidoscopy every 5 years or a colonoscopy every 10 years starting at age 6. Hepatitis C blood test. Hepatitis B  blood test. Sexually transmitted disease (STD) testing. Diabetes screening. This is done by checking your blood sugar (glucose) after you have not eaten for a while (fasting). You may have this done every 1-3 years. Bone density scan. This is done to screen for osteoporosis. You may have this done starting at age 23. Mammogram. This may be done every 1-2 years. Talk to your health care provider about how often you should have  regular mammograms. Talk with your health care provider about your test results, treatment options, and if necessary, the need for more tests. Vaccines  Your health care provider may recommend certain vaccines, such as: Influenza vaccine. This is recommended every year. Tetanus, diphtheria, and acellular pertussis (Tdap, Td) vaccine. You may need a Td booster every 10 years. Zoster vaccine. You may need this after age 52. Pneumococcal 13-valent conjugate (PCV13) vaccine. One dose is recommended after age 39. Pneumococcal polysaccharide (PPSV23) vaccine. One dose is recommended after age 8. Talk to your health care provider about which screenings and vaccines you need and how often you need them. This information is not intended to replace advice given to you by your health care provider. Make sure you discuss any questions you have with your health care provider. Document Released: 10/26/2015 Document Revised: 06/18/2016 Document Reviewed: 07/31/2015 Elsevier Interactive Patient Education  2017 [place] Prevention in the Home Falls can cause injuries. They can happen to people of all ages. There are many things you can do to make your home safe and to help prevent falls. What can I do on the outside of my home? Regularly fix the edges of walkways and driveways and fix any cracks. Remove anything that might make you trip as you walk through a door, such as a raised step or threshold. Trim any bushes or trees on the path to your home. Use bright outdoor lighting. Clear any walking paths of anything that might make someone trip, such as rocks or tools. Regularly check to see if handrails are loose or broken. Make sure that both sides of any steps have handrails. Any raised decks and porches should have guardrails on the edges. Have any leaves, snow, or ice cleared regularly. Use sand or salt on walking paths during winter. Clean up any spills in your garage right away. This  includes oil or grease spills. What can I do in the bathroom? Use night lights. Install grab bars by the toilet and in the tub and shower. Do not use towel bars as grab bars. Use non-skid mats or decals in the tub or shower. If you need to sit down in the shower, use a plastic, non-slip stool. Keep the floor dry. Clean up any water that spills on the floor as soon as it happens. Remove soap buildup in the tub or shower regularly. Attach bath mats securely with double-sided non-slip rug tape. Do not have throw rugs and other things on the floor that can make you trip. What can I do in the bedroom? Use night lights. Make sure that you have a light by your bed that is easy to reach. Do not use any sheets or blankets that are too big for your bed. They should not hang down onto the floor. Have a firm chair that has side arms. You can use this for support while you get dressed. Do not have throw rugs and other things on the floor that can make you trip. What can I do in the kitchen? Clean up any spills  right away. Avoid walking on wet floors. Keep items that you use a lot in easy-to-reach places. If you need to reach something above you, use a strong step stool that has a grab bar. Keep electrical cords out of the way. Do not use floor polish or wax that makes floors slippery. If you must use wax, use non-skid floor wax. Do not have throw rugs and other things on the floor that can make you trip. What can I do with my stairs? Do not leave any items on the stairs. Make sure that there are handrails on both sides of the stairs and use them. Fix handrails that are broken or loose. Make sure that handrails are as long as the stairways. Check any carpeting to make sure that it is firmly attached to the stairs. Fix any carpet that is loose or worn. Avoid having throw rugs at the top or bottom of the stairs. If you do have throw rugs, attach them to the floor with carpet tape. Make sure that you  have a light switch at the top of the stairs and the bottom of the stairs. If you do not have them, ask someone to add them for you. What else can I do to help prevent falls? Wear shoes that: Do not have high heels. Have rubber bottoms. Are comfortable and fit you well. Are closed at the toe. Do not wear sandals. If you use a stepladder: Make sure that it is fully opened. Do not climb a closed stepladder. Make sure that both sides of the stepladder are locked into place. Ask someone to hold it for you, if possible. Clearly mark and make sure that you can see: Any grab bars or handrails. First and last steps. Where the edge of each step is. Use tools that help you move around (mobility aids) if they are needed. These include: Canes. Walkers. Scooters. Crutches. Turn on the lights when you go into a dark area. Replace any light bulbs as soon as they burn out. Set up your furniture so you have a clear path. Avoid moving your furniture around. If any of your floors are uneven, fix them. If there are any pets around you, be aware of where they are. Review your medicines with your doctor. Some medicines can make you feel dizzy. This can increase your chance of falling. Ask your doctor what other things that you can do to help prevent falls. This information is not intended to replace advice given to you by your health care provider. Make sure you discuss any questions you have with your health care provider. Document Released: 07/26/2009 Document Revised: 03/06/2016 Document Reviewed: 11/03/2014 Elsevier Interactive Patient Education  2017 [employer].

## 2022-10-17 NOTE — Progress Notes (Cosign Needed)
Subjective:   Jane Davidson is a 82 y.o. female who presents for Medicare Annual (Subsequent) preventive examination.  Review of Systems    No ROS.  Medicare Wellness Virtual Visit.  Visual/audio telehealth visit, UTA vital signs.   See social history for additional risk factors.   Cardiac Risk Factors include: advanced age (>56mn, >>73women);smoking/ tobacco exposure     Objective:    Today's Vitals   10/17/22 0904  Weight: 140 lb (63.5 kg)  Height: '5\' 4"'$  (1.626 m)   Body mass index is 24.03 kg/m.     10/17/2022    9:09 AM 10/16/2021    9:59 AM 10/15/2020   10:04 AM 04/07/2020    9:00 PM 04/07/2020    3:01 PM 10/12/2019    9:48 AM 10/07/2018    9:57 AM  Advanced Directives  Does Patient Have a Medical Advance Directive? Yes Yes Yes Yes Yes Yes Yes  Type of AArts administratorPower of [place] AManuel Garciaof ASebewaingLiving will  Does patient want to make changes to medical advance directive? No - Patient declined No - Patient declined No - Patient declined No - Patient declined  No - Patient declined No - Patient declined  Copy of HHarringtonin Chart? No - copy requested No - copy requested No - copy requested No - copy requested  No - copy requested No - copy requested    Current Medications (verified) Outpatient Encounter Medications as of 10/17/2022  Medication Sig   amLODipine (NORVASC) 10 MG tablet Take 1 tablet (10 mg total) by mouth daily.   apixaban (ELIQUIS) 5 MG TABS tablet TAKE 1 TABLET(5 MG) BY MOUTH TWICE DAILY   atorvastatin (LIPITOR) 20 MG tablet Take 1 tablet (20 mg total) by mouth daily.   Cholecalciferol (VITAMIN D3) 1000 UNITS CAPS Take 1 capsule by mouth daily.   clobetasol ointment (TEMOVATE) 07.98% Apply 1 application topically 2 (two) times daily.   fluticasone (FLONASE) 50 MCG/ACT nasal spray SHAKE  LIQUID AND USE 2 SPRAYS IN EACH NOSTRIL DAILY   fluticasone-salmeterol (WIXELA INHUB) 100-50 MCG/ACT AEPB Inhale 1 puff into the lungs 2 (two) times daily.   levothyroxine (SYNTHROID) 88 MCG tablet TAKE 1 TABLET(88 MCG) BY MOUTH DAILY   loratadine (CLARITIN) 10 MG tablet Take 1 tablet (10 mg total) by mouth daily.   losartan (COZAAR) 100 MG tablet TAKE 1 TABLET(100 MG) BY MOUTH DAILY   metoprolol tartrate (LOPRESSOR) 25 MG tablet Take 1 tablet (25 mg total) by mouth 2 (two) times daily.   nicotine (NICODERM CQ - DOSED IN MG/24 HOURS) 14 mg/24hr patch Place 1 patch (14 mg total) onto the skin daily.   nicotine (NICODERM CQ - DOSED IN MG/24 HOURS) 21 mg/24hr patch Place 1 patch (21 mg total) onto the skin daily.   nicotine polacrilex (NICORETTE) 4 MG gum Take 1 each (4 mg total) by mouth as needed for smoking cessation. Max dose 24 pieces per day   nitroGLYCERIN (NITROSTAT) 0.4 MG SL tablet Place 1 tablet (0.4 mg total) under the tongue every 5 (five) minutes as needed for chest pain.   sertraline (ZOLOFT) 50 MG tablet TAKE 1 TABLET(50 MG) BY MOUTH DAILY   traMADol (ULTRAM) 50 MG tablet  (Patient not taking: Reported on 10/15/2022)   traMADol (ULTRAM) 50 MG tablet Take 1 tablet (50 mg total) by mouth every 6 (six) hours  as needed for up to 7 days for moderate pain.   triamcinolone cream (KENALOG) 0.1 % Apply 1 application topically 2 (two) times daily.   No facility-administered encounter medications on file as of 10/17/2022.    Allergies (verified) Bee pollen, Pollen extract, Hctz [hydrochlorothiazide], and Other   History: Past Medical History:  Diagnosis Date   Abnormal Pap smear of cervix 2001   Biopsy normal   Bursitis NEC    Cancer ([place])    skin   COPD (chronic obstructive pulmonary disease) ([place])    History of peripheral vascular disease 2001   s/p bilateral aorto-iliac-femoral bypass /[facility]   Hyperlipidemia    hypothyroid    Osteopenia 2009    T scores - 1.5 DEXA 2009   Screening  for breast cancer May 2012   [place] mammogram   Tobacco abuse disorder    Vitamin D deficiency April 2011   replaced withDrsido for level of 11.3 ng/ml   Vitamin D deficiency April 2011   replaced withDrsido for level of 11.3 ng/ml   Past Surgical History:  Procedure Laterality Date   BREAST BIOPSY Left    benign   CAROTID ENDARTERECTOMY Right 2016   Leotis Pain   CATARACT EXTRACTION  Sept 2011   CHOLECYSTECTOMY     HERNIA REPAIR  Jun 2011   [facility]    TUBAL LIGATION     Family History  Problem Relation Age of Onset   Hypertension Mother    Arthritis Mother    Diabetes Father    Dementia Sister    Breast cancer Neg Hx    Social History   Socioeconomic History   Marital status: Widowed    Spouse name: Not on file   Number of children: Not on file   Years of education: Not on file   Highest education level: Not on file  Occupational History   Not on file  Tobacco Use   Smoking status: Every Day    Packs/day: 1.04    Years: 40.00    Total pack years: 41.60    Types: Cigarettes   Smokeless tobacco: Never  Substance and Sexual Activity   Alcohol use: No   Drug use: No   Sexual activity: Never  Other Topics Concern   Not on file  Social History Narrative   Not on file   Social Determinants of Health   Financial Resource Strain: Low Risk  (10/17/2022)   Overall Financial Resource Strain (CARDIA)    Difficulty of Paying Living Expenses: Not very hard  Food Insecurity: No Food Insecurity (10/17/2022)   Hunger Vital Sign    Worried About Running Out of Food in the Last Year: Never true    Ran Out of Food in the Last Year: Never true  Transportation Needs: No Transportation Needs (10/17/2022)   PRAPARE - Hydrologist (Medical): No    Lack of Transportation (Non-Medical): No  Physical Activity: Not on file  Stress: No Stress Concern Present (10/17/2022)   [place]     Feeling of Stress : Only a little  Social Connections: Unknown (10/17/2022)   Social Connection and Isolation Panel [NHANES]    Frequency of Communication with Friends and Family: More than three times a week    Frequency of Social Gatherings with Friends and Family: Once a week    Attends Religious Services: More than 4 times per year    Active Member of Clubs or  Organizations: Yes    Attends Music therapist: Not on file    Marital Status: Not on file    Tobacco Counseling Ready to quit: Not Answered Counseling given: Not Answered   Clinical Intake:  Pre-visit preparation completed: Yes        Diabetes: No  How often do you need to have someone help you when you read instructions, pamphlets, or other written materials from your doctor or pharmacy?: 1 - Never  Diabetes: Is the patient diabetic?  Yes  If diabetic, was a CBG obtained today?  No  Did the patient bring in their glucometer from home?  No   Financial Strains and Diabetes Management: Are you having any financial strains with the device, your supplies or your medication? No .  Does the patient want to be seen by Chronic Care Management for management of their diabetes?  No  Would the patient like to be referred to a Nutritionist or for Diabetic Management?  No     Interpreter Needed?: No      Activities of Daily Living    10/17/2022    9:10 AM  In your present state of health, do you have any difficulty performing the following activities:  Hearing? 0  Vision? 0  Difficulty concentrating or making decisions? 0  Walking or climbing stairs? 0  Dressing or bathing? 0  Doing errands, shopping? 0  Preparing Food and eating ? N  Using the Toilet? N  In the past six months, have you accidently leaked urine? Y  Comment Managed with pad at night.  Do you have problems with loss of bowel control? N  Managing your Medications? N  Managing your Finances? N  Housekeeping or managing your  Housekeeping? N    Patient Care Team: Crecencio Mc, MD as PCP - General (Internal Medicine)  Indicate any recent Medical Services you may have received from other than [facility] providers in the past year (date may be approximate).     Assessment:   This is a routine wellness examination for Jane Davidson.  I connected with  Jane Davidson on 10/17/22 by a audio enabled telemedicine application and verified that I am speaking with the correct person using two identifiers.  Patient Location: Home  Provider Location: Office/Clinic  I discussed the limitations of evaluation and management by telemedicine. The patient expressed understanding and agreed to proceed.   Hearing/Vision screen Hearing Screening - Comments:: Patient is able to hear conversational tones without difficulty.  No issues reported.   Vision Screening - Comments:: Followed by Dr. Matilde Sprang Wears corrective lenses Annual visits Cataract extraction, bilateral    Dietary issues and exercise activities discussed: Current Exercise Habits: Home exercise routine   Goals Addressed               This Visit's Progress     Patient Stated     Quit Smoking (pt-stated)        Not ready to stop smoking today, but will try.  Increase physical activity.       Depression Screen    10/17/2022    9:10 AM 10/15/2022   10:16 AM 04/08/2022    1:21 PM 01/31/2022    1:39 PM 09/18/2021   10:05 AM 06/19/2021   10:22 AM 03/20/2021   10:55 AM  PHQ 2/9 Scores  PHQ - 2 Score 0 0 1 0 4 4 0  PHQ- 9 Score     8 10     Fall Risk  10/17/2022    9:10 AM 10/15/2022   10:16 AM 06/09/2022    1:38 PM 04/08/2022    1:21 PM 01/31/2022    1:39 PM  Fall Risk   Falls in the past year? 0 0 0 0 0  Number falls in past yr: 0  0 0 0  Injury with Fall? 0  0 0 0  Risk for fall due to :  No Fall Risks No Fall Risks No Fall Risks No Fall Risks  Follow up Falls evaluation completed;Falls prevention discussed Falls evaluation completed Falls evaluation  completed Falls evaluation completed Falls evaluation completed    FALL RISK PREVENTION PERTAINING TO THE HOME: Home free of loose throw rugs in walkways, pet beds, electrical cords, etc? Yes  Adequate lighting in your home to reduce risk of falls? Yes   ASSISTIVE DEVICES UTILIZED TO PREVENT FALLS: Life alert? No  Use of a cane, walker or w/c? No   TIMED UP AND GO: Was the test performed? No .   Cognitive Function:    10/12/2019    9:55 AM 10/09/2015   11:00 AM  MMSE - Mini Mental State Exam  Not completed: Unable to complete   Orientation to time  5  Orientation to Place  5  Registration  3  Attention/ Calculation  5  Recall  3  Language- name 2 objects  2  Language- repeat  1  Language- follow 3 step command  3  Language- read & follow direction  1  Write a sentence  1  Copy design  1  Total score  30        10/15/2020   10:05 AM 10/07/2018   10:05 AM 10/07/2017    1:43 PM 10/03/2016    1:41 PM  6CIT Screen  What Year? 0 points 0 points 0 points 0 points  What month? 0 points 0 points 0 points 0 points  What time? 0 points 0 points 0 points 0 points  Count back from 20  0 points 0 points 0 points  Months in reverse 0 points 0 points 0 points 0 points  Repeat phrase  0 points 0 points   Total Score  0 points 0 points     Immunizations Immunization History  Administered Date(s) Administered   Fluad Quad(high Dose 65+) 06/17/2019, 10/18/2020, 06/19/2021   Influenza Split 07/05/2012, 07/27/2013, 06/29/2014   Influenza, High Dose Seasonal PF 06/10/2017, 06/15/2018, 07/28/2022   Influenza,inj,Quad PF,6+ Mos 06/04/2016   Influenza-Unspecified 07/20/2015   PFIZER(Purple Top)SARS-COV-2 Vaccination 11/29/2019, 12/20/2019, 08/09/2020   Pneumococcal Conjugate-13 04/06/2014   Pneumococcal Polysaccharide-23 07/05/2012, 11/18/2017   Td 10/22/2021   Tdap 10/16/2011   Zoster Recombinat (Shingrix) 07/31/2021, 10/22/2021   Zoster, Live 12/19/2011   Screening  Tests Health Maintenance  Topic Date Due   FOOT EXAM  09/18/2022   COVID-19 Vaccine (4 - 2023-24 season) 11/02/2022 (Originally 06/13/2022)   Lung Cancer Screening  12/12/2022 (Originally 08/17/2019)   OPHTHALMOLOGY EXAM  10/21/2022   HEMOGLOBIN A1C  04/15/2023   MAMMOGRAM  05/07/2023   Diabetic kidney evaluation - eGFR measurement  10/16/2023   Diabetic kidney evaluation - Urine ACR  10/16/2023   Medicare Annual Wellness (AWV)  10/18/2023   DTaP/Tdap/Td (3 - Td or Tdap) 10/23/2031   Pneumonia Vaccine 56+ Years old  Completed   INFLUENZA VACCINE  Completed   DEXA SCAN  Completed   Zoster Vaccines- Shingrix  Completed   HPV VACCINES  Aged Out   Health Maintenance Health Maintenance Due  Topic Date Due   FOOT EXAM  09/18/2022   Lung Cancer Screening: declined per patient.   Hepatitis C Screening: does not qualify.  Vision Screening: Recommended annual ophthalmology exams for early detection of glaucoma and other disorders of the eye.  Dental Screening: Recommended annual dental exams for proper oral hygiene. Dentures.  Community Resource Referral / Chronic Care Management: CRR required this visit?  No   CCM required this visit?  No      Plan:     I have personally reviewed and noted the following in the patient's chart:   Medical and social history Use of alcohol, tobacco or illicit drugs  Current medications and supplements including opioid prescriptions. Patient is currently taking opioid prescriptions. Information provided to patient regarding non-opioid alternatives. Patient advised to discuss non-opioid treatment plan with their provider. Followed by PCP. Functional ability and status Nutritional status Physical activity Advanced directives List of other physicians Hospitalizations, surgeries, and ER visits in previous 12 months Vitals Screenings to include cognitive, depression, and falls Referrals and appointments  In addition, I have reviewed and discussed  with patient certain preventive protocols, quality metrics, and best practice recommendations. A written personalized care plan for preventive services as well as general preventive health recommendations were provided to patient.     Buchanan, LPN   01/18/2499    I have reviewed the above information and agree with above.   [name], MD

## 2022-10-21 ENCOUNTER — Other Ambulatory Visit: Payer: Self-pay

## 2022-10-21 MED ORDER — SERTRALINE HCL 50 MG PO TABS: ORAL_TABLET | ORAL | 1 refills | Status: DC

## 2022-10-24 ENCOUNTER — Ambulatory Visit
Admission: RE | Admit: 2022-10-24 | Discharge: 2022-10-24 | Disposition: A | Discharge status: Home / Self Care | Payer: PPO | Source: Ambulatory Visit | Attending: Internal Medicine | Admitting: Internal Medicine

## 2022-10-24 DIAGNOSIS — M810 Age-related osteoporosis without current pathological fracture: Secondary | ICD-10-CM | POA: Diagnosis not present

## 2022-10-24 DIAGNOSIS — Z780 Asymptomatic menopausal state: Secondary | ICD-10-CM | POA: Insufficient documentation

## 2022-10-24 DIAGNOSIS — M8580 Other specified disorders of bone density and structure, unspecified site: Secondary | ICD-10-CM | POA: Insufficient documentation

## 2022-10-26 ENCOUNTER — Encounter: Payer: Self-pay | Admitting: Internal Medicine

## 2022-11-03 ENCOUNTER — Ambulatory Visit (INDEPENDENT_AMBULATORY_CARE_PROVIDER_SITE_OTHER): Payer: PPO | Admitting: Internal Medicine

## 2022-11-03 ENCOUNTER — Encounter: Payer: Self-pay | Admitting: Internal Medicine

## 2022-11-03 VITALS — BP 136/72 | HR 69 | Temp 98.0°F | Ht 64.0 in | Wt 138.0 lb

## 2022-11-03 DIAGNOSIS — L309 Dermatitis, unspecified: Secondary | ICD-10-CM | POA: Insufficient documentation

## 2022-11-03 DIAGNOSIS — E559 Vitamin D deficiency, unspecified: Secondary | ICD-10-CM | POA: Diagnosis not present

## 2022-11-03 DIAGNOSIS — M810 Age-related osteoporosis without current pathological fracture: Secondary | ICD-10-CM | POA: Diagnosis not present

## 2022-11-03 DIAGNOSIS — R238 Other skin changes: Secondary | ICD-10-CM | POA: Diagnosis not present

## 2022-11-03 MED ORDER — LOSARTAN POTASSIUM 100 MG PO TABS: 100.0000 mg | ORAL_TABLET | Freq: Every day | ORAL | 1 refills | Status: DC

## 2022-11-03 MED ORDER — AMLODIPINE BESYLATE 10 MG PO TABS: 10.0000 mg | ORAL_TABLET | Freq: Every day | ORAL | 1 refills | Status: DC

## 2022-11-03 MED ORDER — TRIAMCINOLONE ACETONIDE 0.1 % EX CREA: 1.0000 | TOPICAL_CREAM | Freq: Two times a day (BID) | CUTANEOUS | 0 refills | Status: DC

## 2022-11-03 NOTE — Assessment & Plan Note (Signed)
Left shin,  surrounding recent biopsy. Pruritic . Triamcinolone prescribed

## 2022-11-03 NOTE — Assessment & Plan Note (Signed)
Currently taking 2000 IU's daily  going to ADD 800 IUS with  calcium supplement

## 2022-11-03 NOTE — Patient Instructions (Addendum)
For your dermatitis:  Aveeno,  Cetaphil or Cerave  are the moisturizers I recommend using.  Use one  2 times daily  Use the triamcinolone ointment underneath the moisturizer twice daily   Anti  itch cream may contain Benadryl (dipenhydramine)  ; if not , you can add benadryl itch for the cream     For the osteoporosis:  I recommend getting  2/3 of your  calcium through your diet,  and taking one calcium supplement daily (You need 1800 mg daily )   Unsweetened almond/coconut milk is a great low calorie low carb, cholesterol free  way to increase your dietary calcium and vitamin D.  Try the blue Marcille Blanco  brand   We will try to get prior authorizatio for Prolia (the shot you can get every 6 months)

## 2022-11-03 NOTE — Assessment & Plan Note (Addendum)
Reviewd progression of boen loss on recent DEXA with forearm and hip both affected..  We reviewed her current options:  evista and alendronate both contraindicated due to history of PAF, CAD and dysphagia.  Recommending Prolia

## 2022-11-03 NOTE — Progress Notes (Signed)
Subjective:  Patient ID: Jane Davidson, female    DOB: [birth year]  Age: 82 y.o. 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 Diagnoses of Skin irritation and Vitamin D deficiency were also pertinent to this visit.   HPI Jane Davidson presents for  Chief Complaint  Patient presents with   Medical Management of Chronic Issues    Discuss osteoporosis    1) osteoporosis : diagnosed by DEXA   T score -2.4 femur   Taking 2000 ius of D3  plus 800 in a calcium sjpplement   2( dermatitis left shin from prior surgery  Outpatient Medications Prior to Visit  Medication Sig Dispense Refill   apixaban (ELIQUIS) 5 MG TABS tablet TAKE 1 TABLET(5 MG) BY MOUTH TWICE DAILY 60 tablet 5   atorvastatin (LIPITOR) 20 MG tablet Take 1 tablet (20 mg total) by mouth daily. 90 tablet 3   Calcium Carb-Cholecalciferol (CALCIUM 600 + D PO) Take 1 tablet by mouth daily. Calcium 600 mg and Vitamin D 800 IUs     clobetasol ointment (TEMOVATE) 3.14 % Apply 1 application topically 2 (two) times daily. 30 g 5   fluticasone (FLONASE) 50 MCG/ACT nasal spray SHAKE LIQUID AND USE 2 SPRAYS IN EACH NOSTRIL DAILY 48 g 0   fluticasone-salmeterol (WIXELA INHUB) 100-50 MCG/ACT AEPB Inhale 1 puff into the lungs 2 (two) times daily. 180 each 1   levothyroxine (SYNTHROID) 88 MCG tablet TAKE 1 TABLET(88 MCG) BY MOUTH DAILY 90 tablet 3   loratadine (CLARITIN) 10 MG tablet Take 1 tablet (10 mg total) by mouth daily. 30 tablet 11   metoprolol tartrate (LOPRESSOR) 25 MG tablet Take 1 tablet (25 mg total) by mouth 2 (two) times daily. 60 tablet 5   nitroGLYCERIN (NITROSTAT) 0.4 MG SL tablet Place 1 tablet (0.4 mg total) under the tongue every 5 (five) minutes as needed for chest pain. 50 tablet 3   sertraline (ZOLOFT) 50 MG tablet TAKE 1 TABLET(50 MG) BY MOUTH DAILY 90 tablet 1   traMADol (ULTRAM) 50 MG tablet      amLODipine (NORVASC) 10 MG tablet Take 1 tablet (10 mg total) by mouth daily. 90  tablet 0   losartan (COZAAR) 100 MG tablet TAKE 1 TABLET(100 MG) BY MOUTH DAILY 90 tablet 0   triamcinolone cream (KENALOG) 0.1 % Apply 1 application topically 2 (two) times daily. 30 g 0   Cholecalciferol (VITAMIN D3) 1000 UNITS CAPS Take 1 capsule by mouth daily. (Patient not taking: Reported on 11/03/2022)     nicotine (NICODERM CQ - DOSED IN MG/24 HOURS) 14 mg/24hr patch Place 1 patch (14 mg total) onto the skin daily. (Patient not taking: Reported on 11/03/2022) 28 patch 2   nicotine (NICODERM CQ - DOSED IN MG/24 HOURS) 21 mg/24hr patch Place 1 patch (21 mg total) onto the skin daily. (Patient not taking: Reported on 11/03/2022) 28 patch 2   nicotine polacrilex (NICORETTE) 4 MG gum Take 1 each (4 mg total) by mouth as needed for smoking cessation. Max dose 24 pieces per day (Patient not taking: Reported on 11/03/2022) 100 tablet 5   No facility-administered medications prior to visit.    Review of Systems;  Patient denies headache, fevers, malaise, unintentional weight loss, skin rash, eye pain, sinus congestion and sinus pain, sore throat, dysphagia,  hemoptysis , cough, dyspnea, wheezing, chest pain, palpitations, orthopnea, edema, abdominal pain, nausea, melena, diarrhea, constipation, flank pain, dysuria, hematuria, urinary  Frequency, nocturia, numbness, tingling, seizures,  Focal  weakness, Loss of consciousness,  Tremor, insomnia, depression, anxiety, and suicidal ideation.      Objective:  BP 136/72   Pulse 69   Temp 98 F (36.7 C) (Oral)   Ht '5\' 4"'$  (1.626 m)   Wt 138 lb (62.6 kg)   SpO2 99%   BMI 23.69 kg/m   BP Readings from Last 3 Encounters:  11/03/22 136/72  10/15/22 136/68  06/09/22 (!) 140/80    Wt Readings from Last 3 Encounters:  11/03/22 138 lb (62.6 kg)  10/17/22 140 lb (63.5 kg)  10/15/22 140 lb 12.8 oz (63.9 kg)    Physical Exam Vitals reviewed.  Constitutional:      General: She is not in acute distress.    Appearance: Normal appearance. She is normal  weight. She is not ill-appearing, toxic-appearing or diaphoretic.  HENT:     Head: Normocephalic.  Eyes:     General: No scleral icterus.       Right eye: No discharge.        Left eye: No discharge.     Conjunctiva/sclera: Conjunctivae normal.  Cardiovascular:     Rate and Rhythm: Normal rate and regular rhythm.     Heart sounds: Normal heart sounds.  Pulmonary:     Effort: Pulmonary effort is normal. No respiratory distress.     Breath sounds: Normal breath sounds.  Musculoskeletal:        General: Normal range of motion.  Skin:    General: Skin is warm and dry.     Findings: Erythema and rash present.       Neurological:     General: No focal deficit present.     Mental Status: She is alert and oriented to person, place, and time. Mental status is at baseline.  Psychiatric:        Mood and Affect: Mood normal.        Behavior: Behavior normal.        Thought Content: Thought content normal.        Judgment: Judgment normal.     Lab Results  Component Value Date   HGBA1C 6.0 10/15/2022   HGBA1C 5.8 05/07/2022   HGBA1C 5.9 09/18/2021    Lab Results  Component Value Date   CREATININE 0.76 10/15/2022   CREATININE 0.85 05/07/2022   CREATININE 0.93 04/08/2022    Lab Results  Component Value Date   WBC 8.3 05/07/2022   HGB 11.8 (L) 05/07/2022   HCT 34.3 (L) 05/07/2022   PLT 250.0 05/07/2022   GLUCOSE 103 (H) 10/15/2022   CHOL 115 10/15/2022   TRIG 70.0 10/15/2022   HDL 53.60 10/15/2022   LDLDIRECT 49.0 10/15/2022   LDLCALC 48 10/15/2022   ALT 12 10/15/2022   AST 17 10/15/2022   NA 135 10/15/2022   K 4.1 10/15/2022   CL 101 10/15/2022   CREATININE 0.76 10/15/2022   BUN 17 10/15/2022   CO2 27 10/15/2022   TSH 1.64 09/18/2021   INR 1.0 04/08/2020   HGBA1C 6.0 10/15/2022   MICROALBUR 1.7 10/15/2022    DG Bone Density  Result Date: 10/24/2022 EXAM: DUAL X-RAY ABSORPTIOMETRY (DXA) FOR BONE MINERAL DENSITY IMPRESSION: Your patient Jane Davidson  completed a BMD test on 10/24/2022 using the [employer] (software version: 14.10) manufactured by [employer]. The following summarizes the results of our evaluation. Technologist::TNB PATIENT BIOGRAPHICAL: Name: Jane Davidson Patient ID: [id number] Birth Date: 01-06-1941 Height: 64.0 in. Gender: Female Exam Date: 10/24/2022 Weight: 140.0 lbs.  Indications: Advanced Age, Caucasian, Height Loss, Low Calcium Intake, Osteopenia, Postmenopausal, Tobacco User (Current Smoker) Fractures: Treatments: Vitamin D DENSITOMETRY RESULTS: Site         Region     Measured Date Measured Age WHO Classification Young Adult T-score BMD         %Change vs. Previous Significant Change (*) DualFemur Neck Right 10/24/2023 80.0 Osteopenia -2.4 0.698 g/cm2 -10.6% Yes DualFemur Neck Right 10/15/2016 75.0 Osteopenia -1.8 0.781 g/cm2 - - DualFemur Total Mean 10/24/2022 80.0 Osteopenia -2.0 0.756 g/cm2 -12.1% Yes DualFemur Total Mean 10/15/2016 74.0 Osteopenia -1.2 0.860 g/cm2 - - Left Forearm Radius 33% 10/24/2022 80.0 Osteoporosis -3.7 0.552 g/cm2 -19.2% Yes Left Forearm Radius 33% 10/15/2016 74.0 Osteopenia -2.2 0.683 g/cm2 - - ASSESSMENT: The BMD measured at Forearm Radius 33% is 0.552 g/cm2 with a T-score of -3.7. This patient is considered osteoporotic according to [facility]) criteria. Compared with prior study, there has been significant decrease in the right hip. Compared with prior study, there has been significant decrease in the total mean. Compared with prior study, there has been significant decrease in the left forearm. Lumbar spine was not utilized due to advanced degenerative changes. Patient is not a candidate for FRAX due to diagnosis of osteoporosis. The scan quality is good. World Pharmacologist [facility]) criteria for post-menopausal, Caucasian Women: Normal:                   T-score at or above -1 SD Osteopenia/low bone mass: T-score between -1 and -2.5 SD Osteoporosis:              T-score at or below -2.5 SD RECOMMENDATIONS: 1. All patients should optimize calcium and vitamin D intake. 2. Consider FDA-approved medical therapies in postmenopausal women and men aged 2 years and older, based on the following: a. A hip or vertebral(clinical or morphometric) fracture b. T-score < -2.5 at the femoral neck or spine after appropriate evaluation to exclude secondary causes c. Low bone mass (T-score between -1.0 and -2.5 at the femoral neck or spine) and a 10-year probability of a hip fracture > 3% or a 10-year probability of a major osteoporosis-related fracture > 20% based on the [place]-adapted WHO algorithm 3. Clinician judgment and/or patient preferences may indicate treatment for people with 10-year fracture probabilities above or below these levels FOLLOW-UP: People with diagnosed cases of osteoporosis or at high risk for fracture should have regular bone mineral density tests. For patients eligible for Medicare, routine testing is allowed once every 2 years. The testing frequency can be increased to one year for patients who have rapidly progressing disease, those who are receiving or discontinuing medical therapy to restore bone mass, or have additional risk factors. I have reviewed this report, and agree with the above findings. [facility] Electronically Signed   By: Zerita Boers M.D.   On: 10/24/2022 11:38    Assessment & Plan:  .Osteoporosis of forearm Assessment & Plan: Reviewed her current options:  evista and alendronate both contraindicated due to history of PAF, CAD and dysphagia.  Recommending Prolia    Skin irritation -     Triamcinolone Acetonide; Apply 1 Application topically 2 (two) times daily.  Dispense: 30 g; Refill: 0  Vitamin D deficiency Assessment & Plan: Currently taking 2000 IU's daily  going to ADD 800 IUS with  calcium supplement   Orders: -     VITAMIN D 25 Hydroxy (Vit-D Deficiency, Fractures); Future  Other orders -  amLODIPine Besylate; Take 1 tablet (10 mg total) by mouth daily.  Dispense: 90 tablet; Refill: 1 -     Losartan Potassium; Take 1 tablet (100 mg total) by mouth daily.  Dispense: 90 tablet; Refill: 1     I provided 40  minutes of face-to-face time during this encounter reviewing patient's last visit with me, patient's  most recent visit with cardiology,  recent surgical and non surgical procedures, previous  labs and imaging studies, counseling on currently addressed issues,  and post visit ordering to diagnostics and therapeutics .   Follow-up: Return in about 6 months (around 05/04/2023).   Crecencio Mc, MD

## 2022-11-04 ENCOUNTER — Telehealth: Payer: Self-pay | Admitting: Internal Medicine

## 2022-11-04 MED ORDER — TRAMADOL HCL 50 MG PO TABS: 50.0000 mg | ORAL_TABLET | Freq: Two times a day (BID) | ORAL | 2 refills | Status: AC | PRN

## 2022-11-04 NOTE — Telephone Encounter (Signed)
Prescription Request  11/04/2022  Is this a "Controlled Substance" medicine? Yes  LOV: 11/03/2022  What is the name of the medication or equipment? traMADol (ULTRAM) 50 MG tablet  Have you contacted your pharmacy to request a refill? Yes   Which pharmacy would you like this sent to?   [facility] [place], [address] [place] [address] Phone: [phone] Fax: [phone]    Patient notified that their request is being sent to the clinical staff for review and that they should receive a response within 2 business days.   Please advise at [facility] [phone]

## 2022-11-04 NOTE — Telephone Encounter (Signed)
Requesting: Tramadol Contract: No UDS: No Last Visit: 11/03/2022 Next Visit: 05/04/2023 Last Refill: 10/15/2022 for a 7 day supply  Please Advise

## 2022-11-04 NOTE — Telephone Encounter (Signed)
Tramadol refill for twice daily prn use.

## 2022-11-04 NOTE — Telephone Encounter (Signed)
Pt is aware.  

## 2022-11-13 ENCOUNTER — Other Ambulatory Visit (INDEPENDENT_AMBULATORY_CARE_PROVIDER_SITE_OTHER): Payer: Self-pay | Admitting: Nurse Practitioner

## 2022-11-13 ENCOUNTER — Telehealth: Payer: Self-pay | Admitting: *Deleted

## 2022-11-13 ENCOUNTER — Ambulatory Visit (INDEPENDENT_AMBULATORY_CARE_PROVIDER_SITE_OTHER): Payer: PPO | Admitting: Nurse Practitioner

## 2022-11-13 ENCOUNTER — Encounter: Payer: Self-pay | Admitting: Nurse Practitioner

## 2022-11-13 VITALS — BP 122/60 | HR 67 | Temp 98.2°F | Ht 64.0 in | Wt 136.0 lb

## 2022-11-13 DIAGNOSIS — D649 Anemia, unspecified: Secondary | ICD-10-CM | POA: Diagnosis not present

## 2022-11-13 DIAGNOSIS — M25571 Pain in right ankle and joints of right foot: Secondary | ICD-10-CM | POA: Diagnosis not present

## 2022-11-13 DIAGNOSIS — M25471 Effusion, right ankle: Secondary | ICD-10-CM | POA: Diagnosis not present

## 2022-11-13 DIAGNOSIS — Z8679 Personal history of other diseases of the circulatory system: Secondary | ICD-10-CM

## 2022-11-13 DIAGNOSIS — I6523 Occlusion and stenosis of bilateral carotid arteries: Secondary | ICD-10-CM

## 2022-11-13 NOTE — Assessment & Plan Note (Addendum)
Xray of right ankle. Will contact patient with results. Encouraged to continue wearing compression stockings. Advised rest, ice and elevation.

## 2022-11-13 NOTE — Telephone Encounter (Signed)
New Prolia Candidate  $301 due; PA not required  [employer]

## 2022-11-13 NOTE — Progress Notes (Signed)
Jane Morrow, NP-C Phone: [phone]  Jane Davidson is a 82 y.o. female who presents today for ankle pain and swelling.  Ankle Pain: Patient complains of right ankle pain.  Onset of the symptoms was a week ago. Inciting event: none known. Current symptoms include ability to bear weight, but with some pain, pain at the dorsal aspect of the ankle, swelling, and worsening symptoms after a period of activity.  Aggravating symptoms: walking. Patient's overall course: intermittent. Patient has had no prior ankle problems. Previous visits for this problem: none.  Evaluation to date: none.  Treatment to date:  compression stockings .   Patient reports no swelling in the mornings, only in the evenings. It gets progressively worse throughout the day when on her feet. Denies pain or tenderness in calves. She has a history of PVD and is scheduled to have ultrasounds on her legs tomorrow.   Social History   Tobacco Use  Smoking Status Every Day   Packs/day: 1.04   Years: 40.00   Total pack years: 41.60   Types: Cigarettes  Smokeless Tobacco Never    Current Outpatient Medications on File Prior to Visit  Medication Sig Dispense Refill   amLODipine (NORVASC) 10 MG tablet Take 1 tablet (10 mg total) by mouth daily. 90 tablet 1   apixaban (ELIQUIS) 5 MG TABS tablet TAKE 1 TABLET(5 MG) BY MOUTH TWICE DAILY 60 tablet 5   atorvastatin (LIPITOR) 20 MG tablet Take 1 tablet (20 mg total) by mouth daily. 90 tablet 3   Calcium Carb-Cholecalciferol (CALCIUM 600 + D PO) Take 1 tablet by mouth daily. Calcium 600 mg and Vitamin D 800 IUs     clobetasol ointment (TEMOVATE) 6.22 % Apply 1 application topically 2 (two) times daily. 30 g 5   fluticasone (FLONASE) 50 MCG/ACT nasal spray SHAKE LIQUID AND USE 2 SPRAYS IN EACH NOSTRIL DAILY 48 g 0   fluticasone-salmeterol (WIXELA INHUB) 100-50 MCG/ACT AEPB Inhale 1 puff into the lungs 2 (two) times daily. 180 each 1   levothyroxine (SYNTHROID) 88 MCG tablet TAKE 1  TABLET(88 MCG) BY MOUTH DAILY 90 tablet 3   loratadine (CLARITIN) 10 MG tablet Take 1 tablet (10 mg total) by mouth daily. 30 tablet 11   losartan (COZAAR) 100 MG tablet Take 1 tablet (100 mg total) by mouth daily. 90 tablet 1   metoprolol tartrate (LOPRESSOR) 25 MG tablet Take 1 tablet (25 mg total) by mouth 2 (two) times daily. 60 tablet 5   nitroGLYCERIN (NITROSTAT) 0.4 MG SL tablet Place 1 tablet (0.4 mg total) under the tongue every 5 (five) minutes as needed for chest pain. 50 tablet 3   sertraline (ZOLOFT) 50 MG tablet TAKE 1 TABLET(50 MG) BY MOUTH DAILY 90 tablet 1   traMADol (ULTRAM) 50 MG tablet Take 1 tablet (50 mg total) by mouth every 12 (twelve) hours as needed for moderate pain. 60 tablet 2   triamcinolone cream (KENALOG) 0.1 % Apply 1 Application topically 2 (two) times daily. 30 g 0   No current facility-administered medications on file prior to visit.     ROS see history of present illness  Objective  Physical Exam Vitals:   11/13/22 1545  BP: 122/60  Pulse: 67  Temp: 98.2 F (36.8 C)  SpO2: 99%    BP Readings from Last 3 Encounters:  11/13/22 122/60  11/03/22 136/72  10/15/22 136/68   Wt Readings from Last 3 Encounters:  11/13/22 136 lb (61.7 kg)  11/03/22 138 lb (62.6 kg)  10/17/22 140 lb (63.5 kg)    Physical Exam Constitutional:      General: She is not in acute distress.    Appearance: Normal appearance.  HENT:     Head: Normocephalic.  Cardiovascular:     Rate and Rhythm: Normal rate and regular rhythm.     Heart sounds: Normal heart sounds.  Pulmonary:     Effort: Pulmonary effort is normal.     Breath sounds: Normal breath sounds.  Musculoskeletal:     Right ankle: Swelling (Non pitting) present. No tenderness. Normal range of motion.     Left ankle: Normal.  Skin:    General: Skin is warm and dry.  Neurological:     General: No focal deficit present.     Mental Status: She is alert.  Psychiatric:        Mood and Affect: Mood  normal.        Behavior: Behavior normal.    Assessment/Plan: Please see individual problem list.  Pain and swelling of right ankle Assessment & Plan: Xray of right ankle. Will contact patient with results. Encouraged patient to continue wearing compression stockings. Advised rest, ice and elevating her leg.   Orders: -     DG Ankle Complete Right; Future  History of peripheral vascular disease -     CBC with Differential/Platelet -     Comprehensive metabolic panel    No follow-ups on file.   Jane Morrow, NP-C [place]

## 2022-11-14 ENCOUNTER — Encounter (INDEPENDENT_AMBULATORY_CARE_PROVIDER_SITE_OTHER): Payer: Self-pay | Admitting: Nurse Practitioner

## 2022-11-14 ENCOUNTER — Ambulatory Visit (INDEPENDENT_AMBULATORY_CARE_PROVIDER_SITE_OTHER): Payer: PPO

## 2022-11-14 ENCOUNTER — Ambulatory Visit
Admission: RE | Admit: 2022-11-14 | Discharge: 2022-11-14 | Disposition: A | Discharge status: Home / Self Care | Payer: PPO | Source: Ambulatory Visit | Attending: Nurse Practitioner | Admitting: Nurse Practitioner

## 2022-11-14 ENCOUNTER — Telehealth: Payer: Self-pay

## 2022-11-14 ENCOUNTER — Ambulatory Visit (INDEPENDENT_AMBULATORY_CARE_PROVIDER_SITE_OTHER): Payer: PPO | Admitting: Nurse Practitioner

## 2022-11-14 VITALS — BP 124/70 | HR 69 | Ht 64.0 in | Wt 136.0 lb

## 2022-11-14 DIAGNOSIS — I6523 Occlusion and stenosis of bilateral carotid arteries: Secondary | ICD-10-CM

## 2022-11-14 DIAGNOSIS — I10 Essential (primary) hypertension: Secondary | ICD-10-CM | POA: Diagnosis not present

## 2022-11-14 DIAGNOSIS — M25571 Pain in right ankle and joints of right foot: Secondary | ICD-10-CM

## 2022-11-14 DIAGNOSIS — M25471 Effusion, right ankle: Secondary | ICD-10-CM

## 2022-11-14 DIAGNOSIS — E1151 Type 2 diabetes mellitus with diabetic peripheral angiopathy without gangrene: Secondary | ICD-10-CM

## 2022-11-14 DIAGNOSIS — R600 Localized edema: Secondary | ICD-10-CM | POA: Diagnosis not present

## 2022-11-14 LAB — COMPREHENSIVE METABOLIC PANEL
ALT: 12 U/L (ref 0–35)
AST: 17 U/L (ref 0–37)
Albumin: 4 g/dL (ref 3.5–5.2)
Alkaline Phosphatase: 56 U/L (ref 39–117)
BUN: 18 mg/dL (ref 6–23)
CO2: 26 mEq/L (ref 19–32)
Calcium: 9.1 mg/dL (ref 8.4–10.5)
Chloride: 101 mEq/L (ref 96–112)
Creatinine, Ser: 0.81 mg/dL (ref 0.40–1.20)
GFR: 68.71 mL/min (ref >60.00)
Glucose, Bld: 106 mg/dL — ABNORMAL HIGH (ref 70–99)
Potassium: 4.2 mEq/L (ref 3.5–5.1)
Sodium: 134 mEq/L — ABNORMAL LOW (ref 135–145)
Total Bilirubin: 0.4 mg/dL (ref 0.2–1.2)
Total Protein: 7.1 g/dL (ref 6.0–8.3)

## 2022-11-14 LAB — CBC WITH DIFFERENTIAL/PLATELET
Basophils Absolute: 0 10*3/uL (ref 0.0–0.1)
Basophils Relative: 0.4 % (ref 0.0–3.0)
Eosinophils Absolute: 0.1 10*3/uL (ref 0.0–0.7)
Eosinophils Relative: 0.7 % (ref 0.0–5.0)
HCT: 32.4 % — ABNORMAL LOW (ref 36.0–46.0)
Hemoglobin: 11.4 g/dL — ABNORMAL LOW (ref 12.0–15.0)
Lymphocytes Relative: 9.1 % — ABNORMAL LOW (ref 12.0–46.0)
Lymphs Abs: 0.7 10*3/uL (ref 0.7–4.0)
MCHC: 35.1 g/dL (ref 30.0–36.0)
MCV: 91.7 fl (ref 78.0–100.0)
Monocytes Absolute: 0.7 10*3/uL (ref 0.1–1.0)
Monocytes Relative: 9.9 % (ref 3.0–12.0)
Neutro Abs: 6 10*3/uL (ref 1.4–7.7)
Neutrophils Relative %: 79.9 % — ABNORMAL HIGH (ref 43.0–77.0)
Platelets: 266 10*3/uL (ref 150.0–400.0)
RBC: 3.53 Mil/uL — ABNORMAL LOW (ref 3.87–5.11)
RDW: 13.3 % (ref 11.5–15.5)
WBC: 7.5 10*3/uL (ref 4.0–10.5)

## 2022-11-14 NOTE — Addendum Note (Signed)
Addended by: Tomasita Morrow on: 11/14/2022 03:46 PM   Modules accepted: Orders

## 2022-11-14 NOTE — Telephone Encounter (Signed)
Spoke with pts daughter and she stated that they were at the hospital for an ultrasound and they asked about the Xray that Tomasita Morrow ordered and she stated taht they said they did not have an order. After looking in the chart Kacy did order the X-ray for the correct location  [facility]. Pts daughter was advised they should be able to go to the medical mall side and let them know orders have been placed and they should be able to get it done as it is in her chart as ordered. Pts daughter asked for the number to the medical mall as she wanted to call and see if the order was there before she took her mother. She was advised that the medical mall did not have a specific number but she was given the hospitals number.

## 2022-11-14 NOTE — Addendum Note (Signed)
Addended by: Leeanne Rio on: 11/14/2022 03:45 PM   Modules accepted: Orders

## 2022-11-14 NOTE — Telephone Encounter (Signed)
Pt wants to hold off on Prolia & discuss more at her next OV in July.

## 2022-11-14 NOTE — Telephone Encounter (Signed)
Patient's daughter, Heywood Bene, called to state patient would like to know where she is supposed to go to have an x-ray of her ankle.  [facility] states patient states Tomasita Morrow, NP, wanted her to have an x-ray of her ankle.  Please call.

## 2022-11-14 NOTE — Telephone Encounter (Signed)
LMTCB

## 2022-11-17 NOTE — Telephone Encounter (Signed)
Spoke with pt and scheduled a follow up for 12/08/2022.

## 2022-11-18 ENCOUNTER — Other Ambulatory Visit (INDEPENDENT_AMBULATORY_CARE_PROVIDER_SITE_OTHER): Payer: PPO

## 2022-11-18 ENCOUNTER — Encounter: Payer: Self-pay | Admitting: Internal Medicine

## 2022-11-18 DIAGNOSIS — D649 Anemia, unspecified: Secondary | ICD-10-CM

## 2022-11-18 NOTE — Addendum Note (Signed)
Addended by: Crecencio Mc on: 11/18/2022 01:33 PM   Modules accepted: Orders

## 2022-11-19 ENCOUNTER — Telehealth: Payer: Self-pay | Admitting: Internal Medicine

## 2022-11-19 LAB — B12 AND FOLATE PANEL
Folate: 20.2 ng/mL (ref >5.9)
Vitamin B-12: 339 pg/mL (ref 211–911)

## 2022-11-19 NOTE — Telephone Encounter (Signed)
Pt daughter called staying that she's very worried about her mom because her leg still swollen and its in pain, and she's concern it might be blood clot. She said the appt with Dr. Derrel Nip its too far and would like something sooner, I offered with our nurse practitioners and she said she will call back. She's available [phone].

## 2022-11-19 NOTE — Telephone Encounter (Signed)
Spoke with pt's daughter and advised that we did not have a DPR on file so I could not give her any information on the pt. Daughter stated that she understood but would like to let us know that she is worried about her mother's ankle. She stated that the ankle is still swollen and painful and has been for a few weeks. She stated that she is concerned because the xray showed nothing wrong so they are concerned it could be a blood clot. I advised the daughter that if they were concerned of a blood clot then the pt should be evaluated immediately at the ED. I did ask the daughter if the pt had mentioned any other symptoms such as SOBr, chest pain, redness or warmth at the site of the swelling. Daughter stated that she was unsure. I tried to call the pt myself to get more information and pt did not answer. I did call the daughter back to let her know that pt did not answer but if she were to talk to her later this evening and she is having any of those symptoms she would need to be evaluated immediately. Daughter gave a verbal understanding.

## 2022-11-20 ENCOUNTER — Other Ambulatory Visit: Payer: Self-pay | Admitting: Nurse Practitioner

## 2022-11-20 DIAGNOSIS — M25471 Effusion, right ankle: Secondary | ICD-10-CM

## 2022-11-20 NOTE — Telephone Encounter (Signed)
Spoke with pt and she stated that she told her daughter that she should have not called. Pt stated her foot in not swollen right now but she would like to know why the rt foot hurts sometimes and why it swells. She denies any SOB, heat at sight, pain (at the moment), or chest pain. Pt states she will do an [place] if that will help identify what's going on. Pt stated she can be reached at her cellphone if they need to contact her while she is out [phone]).

## 2022-11-21 LAB — IRON,TIBC AND FERRITIN PANEL
%SAT: 28 % (calc) (ref 16–45)
Ferritin: 41 ng/mL (ref 16–288)
Iron: 99 ug/dL (ref 45–160)
TIBC: 349 mcg/dL (calc) (ref 250–450)

## 2022-11-21 LAB — PROTEIN ELECTROPHORESIS, SERUM
Albumin ELP: 3.8 g/dL (ref 3.8–4.8)
Alpha 1: 0.3 g/dL (ref 0.2–0.3)
Alpha 2: 0.6 g/dL (ref 0.5–0.9)
Beta 2: 0.3 g/dL (ref 0.2–0.5)
Beta Globulin: 0.4 g/dL (ref 0.4–0.6)
Gamma Globulin: 1.2 g/dL (ref 0.8–1.7)
Total Protein: 6.6 g/dL (ref 6.1–8.1)

## 2022-11-22 LAB — IFE AND PE, RANDOM URINE
% BETA, Urine: 36.2 %
ALBUMIN, U: 34.9 %
ALPHA 1 URINE: 2.1 %
ALPHA-2-GLOBULIN, U: 10.4 %
GAMMA GLOBULIN URINE: 16.5 %
Protein, Ur: 17.8 mg/dL

## 2022-11-25 ENCOUNTER — Ambulatory Visit: Payer: PPO | Attending: Internal Medicine | Admitting: Physical Therapy

## 2022-11-25 ENCOUNTER — Encounter: Payer: Self-pay | Admitting: Physical Therapy

## 2022-11-25 DIAGNOSIS — G8929 Other chronic pain: Secondary | ICD-10-CM | POA: Diagnosis not present

## 2022-11-25 DIAGNOSIS — M79671 Pain in right foot: Secondary | ICD-10-CM | POA: Insufficient documentation

## 2022-11-25 DIAGNOSIS — M5416 Radiculopathy, lumbar region: Secondary | ICD-10-CM | POA: Diagnosis not present

## 2022-11-25 DIAGNOSIS — M25552 Pain in left hip: Secondary | ICD-10-CM | POA: Insufficient documentation

## 2022-11-25 NOTE — Therapy (Signed)
OUTPATIENT PHYSICAL THERAPY THORACOLUMBAR EVALUATION   Patient Name: Jane Davidson MRN: [id number] DOB:20-Mar-1941, 82 y.o., female Today's Date: 11/26/2022  END OF SESSION:  PT End of Session - 11/25/22 1529     Visit Number 1    Number of Visits 10    Date for PT Re-Evaluation 02/10/23    Authorization - Visit Number 1    Authorization - Number of Visits 10    Progress Note Due on Visit 10    PT Start Time 1430    PT Stop Time [id number]    PT Time Calculation (min) 45 min    Activity Tolerance Patient tolerated treatment well    Behavior During Therapy [facility] for tasks assessed/performed             Past Medical History:  Diagnosis Date   Abnormal Pap smear of cervix 2001   Biopsy normal   Bursitis NEC    Cancer ([place])    skin   COPD (chronic obstructive pulmonary disease) ([place])    History of peripheral vascular disease 2001   s/p bilateral aorto-iliac-femoral bypass /[place]   Hyperlipidemia    hypothyroid    Osteopenia 2009    T scores - 1.5 DEXA 2009   Screening for breast cancer May 2012   [place] mammogram   Tobacco abuse disorder    Vitamin D deficiency April 2011   replaced withDrsido for level of 11.3 ng/ml   Vitamin D deficiency April 2011   replaced withDrsido for level of 11.3 ng/ml   Past Surgical History:  Procedure Laterality Date   BREAST BIOPSY Left    benign   CAROTID ENDARTERECTOMY Right 2016   Leotis Pain   CATARACT EXTRACTION  Sept 2011   [place]  Jun 2011   [place]    TUBAL LIGATION     Patient Active Problem List   Diagnosis Date Noted   Pain and swelling of right ankle 11/13/2022   Dermatitis 11/03/2022   Left sided sciatica 10/15/2022   Urinary urgency 04/08/2022   Major depressive disorder, single episode, in remission ([place]) 07/18/2021   Aortic atherosclerosis ([place]) [id number]   Gastritis, Helicobacter pylori [id number]   Acquired thrombophilia ([place]) 04/15/2020   PAF (paroxysmal atrial fibrillation)  ([place]) 04/07/2020   Degeneration of cervical intervertebral disc 05/28/2019   Type 2 diabetes mellitus with diabetic peripheral angiopathy without gangrene, without long-term current use of insulin ([place]) 05/28/2019   Chest pain at rest 08/15/2018   Hyponatremia 06/11/2018   Osteoarthritis of spine 03/01/2018   Chronic low back pain without sciatica 03/01/2018   Spinal stenosis of lumbosacral region 03/01/2018   Lumbar herniated disc 03/01/2018   Cardiac murmur 03/01/2018   Nausea in adult 03/01/2018   Dysuria 06/10/2017   Lumbar radiculopathy 06/04/2017   Atopic dermatitis 04/22/2017   Carotid stenosis 08/29/2016   Essential hypertension 02/29/2016   Chronic left hip pain 04/01/2015   Constipation 06/08/2014   Anemia 09/27/2013   Encounter for preventive health examination 03/27/2013   Screening for colon cancer 10/16/2011   Tobacco abuse counseling 08/09/2011   History of peripheral vascular disease    Osteoporosis of forearm    Tobacco abuse disorder    Vitamin D deficiency    Arrhythmia 07/05/2011   Hypothyroidism 07/05/2011   Varicose veins of left lower extremity with pain 07/05/2011   COPD (chronic obstructive pulmonary disease) ([place])    Hyperlipidemia    Screening for breast cancer 02/11/2011    PCP:  Derrel Nip MD  REFERRING PROVIDER: Derrel Nip MD  REFERRING DIAG: R foot pain  Rationale for Evaluation and Treatment: Rehabilitation  THERAPY DIAG:  Pain in right foot - Plan: PT plan of care cert/re-cert  ONSET DATE: April 2023  SUBJECTIVE:                                                                                                                                                                                           SUBJECTIVE STATEMENT: R foot pain  PERTINENT HISTORY:  Pt with 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 Order sent to physical therapy for L hip pain with LLE radicular symptoms. She reports she has not had that hip pain she was referred  for "for a while", and that she has R foot pain that is more pertinent to current dysfunction. Current L hip 0/10; reports R foot pain is 7/10. No pain in L hip over the past several weeks. R foot pain is at medial arch. Patient reports increased R foot pain with standing, walking >48mns, and amplified by walking on uneven ground in her yard. Pain in R foot is alleviated by tylenol. She is having swelling in R foot that is more so when she is up and moving around. She lives alone with her dog, completes her own household and community errands. Finds it difficult to gather sticks in her yard for her brother to mow d/t foot pain. Reports no falls in the past 6 months. Pt denies N/V, B&B changes, unexplained weight fluctuation, saddle paresthesia, fever, night sweats, or unrelenting night pain at this time.  New order sent for R foot pain   PAIN:  Are you having pain? Yes: NPRS scale: 7/10 Pain location: medial arch Pain description: nagging; step wrong then sharp Aggravating factors: walking, standing, walking on uneven ground Relieving factors: tylenol  PRECAUTIONS: None  WEIGHT BEARING RESTRICTIONS: No  FALLS:  Has patient fallen in last 6 months? No  LIVING ENVIRONMENT: Lives with: lives alone Lives in: House/apartment Stairs: Yes: External: 3-4 steps; none Has following equipment at home: None grab bars in bathroom, shower seat  OCCUPATION: retired; enjoys reading  PLOF: Independent  PATIENT GOALS: decrease pain   NEXT MD VISIT: 12/03/22 [place]  OBJECTIVE:   DIAGNOSTIC FINDINGS:  Xray R ankle 11/16/22 unremarkable  PATIENT SURVEYS:  FOTO next visit  SCREENING FOR RED FLAGS: Bowel or bladder incontinence: No Spinal tumors: No Cauda equina syndrome: No Compression fracture: No Abdominal aneurysm: No  COGNITION: Overall cognitive status: Within functional limits for tasks assessed     SENSATION: WFL   EDEMA:  Visually present  slightly R>L   POSTURE: bilat  navicular drop R>L, midfoot pronation, bilat knee valgus, bilat hip IR  PALPATION: TTP at top of foot d/t swelling TTP with concordant pain to mdial arch foot intrinsics Minimal pain with palpable tension and trigger points to gastroc  LOWER EXTREMITY ROM:  Active ROM Right eval Left eval  Hip flexion    Hip extension 50% limited bilat  Hip abduction wnl wnl  Hip adduction    Hip internal rotation 25% limited bilat  Hip external rotation wnl wnl  Knee flexion wnl wnl  Knee extension wnl wnl  Ankle dorsiflexion 4 13  Ankle plantarflexion WNL WNL  Ankle eversion 22 30  Ankle inversion 28 49   (Blank rows = not tested) GT passive ROM L 80 R 77   LOWER EXTREMITY MMT:  MMT Right eval Left eval  Hip flexion 4+ 4+  Hip extension 3+ 3+  Hip abduction 4- 4-  Hip adduction    Hip internal rotation 5 (within available range) 5 (within available range)  Hip external rotation 4 4  Knee flexion 4+ 4+  Knee extension    Ankle dorsiflexion 3+ 4+  Ankle plantarflexion    Ankle inversion 5 5  Ankle eversion 5 5   (Blank rows = not tested)  SLE heel raises R: 23 L: 23  LOWER EXTREMITY SPECIAL TESTS:  Ankle special tests: Homan's test: negative Windlass +R foot  FUNCTIONAL TESTS:  10 meter walk test: self selected  0.52ms; fastest 1.142m  GAIT: Distance walked: 2038msistive device utilized: None Level of assistance: Complete Independence Comments: ankle eversion used through R foot swing for minimal foot clearance, decreased R stance with decreased L step length, decreased speed   TODAY'S TREATMENT:                                                                                                                              DATE: 11/25/22 PT reviewed the following HEP with patient with patient able to demonstrate a set of the following with min cuing for correction needed. PT educated patient on parameters of therex (how/when to inc/decrease intensity, frequency, rep/set  range, stretch hold time, and purpose of therex) with verbalized understanding.   Access Code: [email or web address] Gastroc Stretch  - 1-3 x daily - 7 x weekly - 30-60sec hold - Seated Self Great Toe Stretch  - 1-3 x daily - 7 x weekly - 30-60sec hold - Standing Single Leg Stance with Counter Support  - 1-2 x daily - 7 x weekly - 15sec hold    PATIENT EDUCATION:  Education details: Patient was educated on diagnosis, anatomy and pathology involved, prognosis, role of PT, and was given an HEP, demonstrating exercise with proper form following verbal and tactile cues, and was given a paper hand out to continue exercise at home. Pt was educated on and agreed to plan of care.  Person educated: Patient Education method: Explanation, Demonstration, and Verbal  cues Education comprehension: verbalized understanding, returned demonstration, and verbal cues required  HOME EXERCISE PROGRAM: Access Code: [email or web address]  - Standing Gastroc Stretch  - 1-3 x daily - 7 x weekly - 30-60sec hold - Seated Self Great Toe Stretch  - 1-3 x daily - 7 x weekly - 30-60sec hold - Standing Single Leg Stance with Counter Support  - 1-2 x daily - 7 x weekly - 15sec hold  ASSESSMENT:  CLINICAL IMPRESSION: Patient is a 82 y.o. female who was seen today for physical therapy evaluation and treatment for R foot pain. Pt with referral for L hip pain, reporting this has been resolved for a while, PT requested referral for R foot pain from MD. Pt with known swelling to R ankle with [place] scheduled next week for diagnosis of this. Signs and symptoms of plantar fasciitis of R foot. Impairments in decreased static balance, increased soft tissue restriction of foot intrinsics and gastroc, decreased ankle mobility, decreased hip and ankle strength, abnormal gait, and pain. Activity limitations in ambulation distance, ambulation on varied surfaces, standing >52mns, basic transfers, and stair negotiation; inhibiting full participation in  community and household ADLs. Would benefit from skilled PT to address above deficits and promote optimal return to PLOF.   OBJECTIVE IMPAIRMENTS: Abnormal gait, decreased activity tolerance, decreased balance, decreased endurance, decreased mobility, difficulty walking, decreased ROM, decreased strength, increased fascial restrictions, increased muscle spasms, impaired flexibility, impaired tone, improper body mechanics, postural dysfunction, and pain.   ACTIVITY LIMITATIONS: carrying, lifting, standing, squatting, transfers, dressing, and locomotion level  PARTICIPATION LIMITATIONS: meal prep, cleaning, laundry, shopping, community activity, and yard work  PERSONAL FACTORS: Age, Fitness, Past/current experiences, Time since onset of injury/illness/exacerbation, and 3+ comorbidities: OA, chronic LBP, COPD, HTN, DM2  are also affecting patient's functional outcome.   REHAB POTENTIAL: Good  CLINICAL DECISION MAKING: Evolving/moderate complexity  EVALUATION COMPLEXITY: Moderate   GOALS: Goals reviewed with patient? Yes  SHORT TERM GOALS: Target date: 12/26/22 Pt will be independent with HEP in order to improve strength and balance in order to decrease fall risk and improve function at home and work.  Baseline: HEP given  Goal status: INITIAL    LONG TERM GOALS: Target date: 02/10/23  Patient will increase FOTO score to 79 to demonstrate predicted increase in functional mobility to complete ADLs Baseline: next visit Goal status: INITIAL  2.  Pt will decrease worst pain as reported on NPRS by at least 3 points in order to demonstrate clinically significant reduction in pain.  Baseline: 7/10 Goal status: INITIAL  3.  Pt will increase 10MWT to at least 1.2 m/s in order to demonstrate clinically significant independence in community ambulation.   Baseline:  Goal status: INITIAL  4.  Pt will demonstrate SLS of 18 sec or more bilat in order to demonstrate age matched normal static  balance needed to reduce fall risk Baseline: 3sec bilat Goal status: INITIAL     PLAN:  PT FREQUENCY: 1-2x/week  PT DURATION: 8 weeks  PLANNED INTERVENTIONS: Therapeutic exercises, Therapeutic activity, Neuromuscular re-education, Balance training, Gait training, Patient/Family education, Self Care, Joint mobilization, Joint manipulation, Stair training, Aquatic Therapy, Dry Needling, Electrical stimulation, Spinal manipulation, Spinal mobilization, Cryotherapy, Moist heat, Traction, Ultrasound, Manual therapy, and Re-evaluation  PLAN FOR NEXT SESSION: FHobartDPT  CDurwin Reges PT 11/26/2022, 9:50 AM

## 2022-11-26 ENCOUNTER — Encounter (INDEPENDENT_AMBULATORY_CARE_PROVIDER_SITE_OTHER): Payer: Self-pay | Admitting: Nurse Practitioner

## 2022-11-26 NOTE — Progress Notes (Signed)
Subjective:    Patient ID: Jane Davidson, female    DOB: 10-27-1940, 82 y.o.   MRN: [id number] Chief Complaint  Patient presents with   Follow-up    1 year follow up carotid    The patient is seen for follow up evaluation of carotid stenosis. The carotid stenosis followed by ultrasound.  He has a right previous carotid endarterectomy  The patient denies amaurosis fugax. There is no recent history of TIA symptoms or focal motor deficits. There is no prior documented CVA.  The patient is taking enteric-coated aspirin 81 mg daily.  There is no history of migraine headaches. There is no history of seizures.  No recent shortening of the patient's walking distance or new symptoms consistent with claudication.  No history of rest pain symptoms. No new ulcers or wounds of the lower extremities have occurred.  There is no history of DVT, PE or superficial thrombophlebitis. No recent episodes of angina or shortness of breath documented.   Duplex ultrasound shows 1 to 39% stenosis bilaterally however there is some shadowing noted in the left ICA that was not noted previously.  Due to the shadowing some of the stenosis in the left ICA was not able to be as well-visualized.    Review of Systems  All other systems reviewed and are negative.      Objective:   Physical Exam Vitals reviewed.  HENT:     Head: Normocephalic.  Neck:     Vascular: No carotid bruit.  Cardiovascular:     Rate and Rhythm: Normal rate.  Pulmonary:     Effort: Pulmonary effort is normal.  Skin:    General: Skin is warm and dry.  Neurological:     Mental Status: She is alert and oriented to person, place, and time.  Psychiatric:        Mood and Affect: Mood normal.        Behavior: Behavior normal.        Thought Content: Thought content normal.        Judgment: Judgment normal.     BP 124/70   Pulse 69   Ht 5' 4"$  (1.626 m)   Wt 136 lb (61.7 kg)   BMI 23.34 kg/m   Past Medical History:   Diagnosis Date   Abnormal Pap smear of cervix 2001   Biopsy normal   Bursitis NEC    Cancer ([place])    skin   COPD (chronic obstructive pulmonary disease) ([place])    History of peripheral vascular disease 2001   s/p bilateral aorto-iliac-femoral bypass /[place]   Hyperlipidemia    hypothyroid    Osteopenia 2009    T scores - 1.5 DEXA 2009   Screening for breast cancer May 2012   [place] mammogram   Tobacco abuse disorder    Vitamin D deficiency April 2011   replaced withDrsido for level of 11.3 ng/ml   Vitamin D deficiency April 2011   replaced withDrsido for level of 11.3 ng/ml    Social History   Socioeconomic History   Marital status: Widowed    Spouse name: Not on file   Number of children: Not on file   Years of education: Not on file   Highest education level: Not on file  Occupational History   Not on file  Tobacco Use   Smoking status: Every Day    Packs/day: 1.04    Years: 40.00    Total pack years: 41.60    Types: Cigarettes  Smokeless tobacco: Never  Substance and Sexual Activity   Alcohol use: No   Drug use: No   Sexual activity: Never  Other Topics Concern   Not on file  Social History Narrative   Not on file   Social Determinants of Health   Financial Resource Strain: Low Risk  (10/17/2022)   Overall Financial Resource Strain (CARDIA)    Difficulty of Paying Living Expenses: Not very hard  Food Insecurity: No Food Insecurity (10/17/2022)   Hunger Vital Sign    Worried About Running Out of Food in the Last Year: Never true    Ran Out of Food in the Last Year: Never true  Transportation Needs: No Transportation Needs (10/17/2022)   PRAPARE - Hydrologist (Medical): No    Lack of Transportation (Non-Medical): No  Physical Activity: Not on file  Stress: No Stress Concern Present (10/17/2022)   [place]    Feeling of Stress : Only a little  Social Connections:  Unknown (10/17/2022)   Social Connection and Isolation Panel [NHANES]    Frequency of Communication with Friends and Family: More than three times a week    Frequency of Social Gatherings with Friends and Family: Once a week    Attends Religious Services: More than 4 times per year    Active Member of [employer] or Organizations: Yes    Attends Archivist Meetings: Not on file    Marital Status: Not on file  Intimate Partner Violence: Not At Risk (10/17/2022)   Humiliation, Afraid, Rape, and Kick questionnaire    Fear of Current or Ex-Partner: No    Emotionally Abused: No    Physically Abused: No    Sexually Abused: No    Past Surgical History:  Procedure Laterality Date   BREAST BIOPSY Left    benign   CAROTID ENDARTERECTOMY Right 2016   Leotis Pain   CATARACT EXTRACTION  Sept 2011   CHOLECYSTECTOMY     HERNIA REPAIR  Jun 2011   [place]    TUBAL LIGATION      Family History  Problem Relation Age of Onset   Hypertension Mother    Arthritis Mother    Diabetes Father    Dementia Sister    Breast cancer Neg Hx     Allergies  Allergen Reactions   Bee Pollen Other (See Comments)   Pollen Extract    Hctz [Hydrochlorothiazide]     Hyponatremia    Other Cough    Other reaction(s): Other (See Comments) Running nose       Latest Ref Rng & Units 11/13/2022    4:06 PM 05/07/2022   10:38 AM 04/11/2022   10:17 AM  CBC  WBC 4.0 - 10.5 K/uL 7.5  8.3  6.6   Hemoglobin 12.0 - 15.0 g/dL 11.4  11.8  11.5   Hematocrit 36.0 - 46.0 % 32.4  34.3  34.1   Platelets 150.0 - 400.0 K/uL 266.0  250.0  241.0       CMP     Component Value Date/Time   NA 134 (L) 11/13/2022 1606   NA 132 (L) 11/03/2014 0430   K 4.2 11/13/2022 1606   K 4.6 11/03/2014 0430   CL 101 11/13/2022 1606   CL 102 11/03/2014 0430   CO2 26 11/13/2022 1606   CO2 24 11/03/2014 0430   GLUCOSE 106 (H) 11/13/2022 1606   GLUCOSE 134 (H) 11/03/2014 0430  BUN 18 11/13/2022 1606   BUN 19 (H) 11/03/2014 0430    CREATININE 0.81 11/13/2022 1606   CREATININE 0.80 11/03/2014 0430   CALCIUM 9.1 11/13/2022 1606   CALCIUM 8.4 (L) 11/03/2014 0430   PROT 6.6 11/18/2022 1452   ALBUMIN 4.0 11/13/2022 1606   AST 17 11/13/2022 1606   ALT 12 11/13/2022 1606   ALKPHOS 56 11/13/2022 1606   BILITOT 0.4 11/13/2022 1606   GFRNONAA >60 04/08/2020 0431   GFRNONAA >60 11/03/2014 0430   GFRAA >60 04/08/2020 0431   GFRAA >60 11/03/2014 0430     No results found.     Assessment & Plan:   1. Bilateral carotid artery stenosis Recommend:  Given the patient's asymptomatic subcritical stenosis no further invasive testing or surgery at this time.  Duplex ultrasound shows 1 to 39% stenosis bilaterally however there is some shadowing noted in the left ICA that was not noted previously.  We did offer a possible CTA for evaluation however the patient not wish to move forward at this time.  Instead we will have her return with close follow-up.  Continue antiplatelet therapy as prescribed Continue management of CAD, HTN and Hyperlipidemia Healthy heart diet,  encouraged exercise at least 4 times per week Follow up in 3 months with duplex ultrasound and physical exam   2. Essential hypertension Continue antihypertensive medications as already ordered, these medications have been reviewed and there are no changes at this time.  3. Type 2 diabetes mellitus with diabetic peripheral angiopathy without gangrene, without long-term current use of insulin (HCC) Continue hypoglycemic medications as already ordered, these medications have been reviewed and there are no changes at this time.  Hgb A1C to be monitored as already arranged by primary service   Current Outpatient Medications on File Prior to Visit  Medication Sig Dispense Refill   amLODipine (NORVASC) 10 MG tablet Take 1 tablet (10 mg total) by mouth daily. 90 tablet 1   apixaban (ELIQUIS) 5 MG TABS tablet TAKE 1 TABLET(5 MG) BY MOUTH TWICE DAILY 60 tablet 5    atorvastatin (LIPITOR) 20 MG tablet Take 1 tablet (20 mg total) by mouth daily. 90 tablet 3   Calcium Carb-Cholecalciferol (CALCIUM 600 + D PO) Take 1 tablet by mouth daily. Calcium 600 mg and Vitamin D 800 IUs     clobetasol ointment (TEMOVATE) [id number] % Apply 1 application topically 2 (two) times daily. 30 g 5   fluticasone (FLONASE) 50 MCG/ACT nasal spray SHAKE LIQUID AND USE 2 SPRAYS IN EACH NOSTRIL DAILY 48 g 0   fluticasone-salmeterol (WIXELA INHUB) 100-50 MCG/ACT AEPB Inhale 1 puff into the lungs 2 (two) times daily. 180 each 1   levothyroxine (SYNTHROID) 88 MCG tablet TAKE 1 TABLET(88 MCG) BY MOUTH DAILY 90 tablet 3   loratadine (CLARITIN) 10 MG tablet Take 1 tablet (10 mg total) by mouth daily. 30 tablet 11   losartan (COZAAR) 100 MG tablet Take 1 tablet (100 mg total) by mouth daily. 90 tablet 1   metoprolol tartrate (LOPRESSOR) 25 MG tablet Take 1 tablet (25 mg total) by mouth 2 (two) times daily. 60 tablet 5   nitroGLYCERIN (NITROSTAT) 0.4 MG SL tablet Place 1 tablet (0.4 mg total) under the tongue every 5 (five) minutes as needed for chest pain. 50 tablet 3   sertraline (ZOLOFT) 50 MG tablet TAKE 1 TABLET(50 MG) BY MOUTH DAILY 90 tablet 1   traMADol (ULTRAM) 50 MG tablet Take 1 tablet (50 mg total) by mouth every 12 (twelve) hours  as needed for moderate pain. 60 tablet 2   triamcinolone cream (KENALOG) 0.1 % Apply 1 Application topically 2 (two) times daily. 30 g 0   No current facility-administered medications on file prior to visit.    There are no Patient Instructions on file for this visit. No follow-ups on file.   Kris Hartmann, NP

## 2022-11-27 ENCOUNTER — Ambulatory Visit (INDEPENDENT_AMBULATORY_CARE_PROVIDER_SITE_OTHER): Payer: PPO | Admitting: Nurse Practitioner

## 2022-11-27 ENCOUNTER — Encounter: Payer: Self-pay | Admitting: Nurse Practitioner

## 2022-11-27 ENCOUNTER — Ambulatory Visit: Payer: PPO | Admitting: Physical Therapy

## 2022-11-27 VITALS — BP 110/70 | HR 73 | Temp 97.6°F | Ht 64.0 in | Wt 133.6 lb

## 2022-11-27 DIAGNOSIS — R238 Other skin changes: Secondary | ICD-10-CM | POA: Diagnosis not present

## 2022-11-27 DIAGNOSIS — K644 Residual hemorrhoidal skin tags: Secondary | ICD-10-CM | POA: Diagnosis not present

## 2022-11-27 MED ORDER — TRIAMCINOLONE ACETONIDE 0.1 % EX CREA: 1.0000 | TOPICAL_CREAM | Freq: Two times a day (BID) | CUTANEOUS | 0 refills | Status: DC

## 2022-11-27 NOTE — Assessment & Plan Note (Signed)
Left-sided external hemorrhoid. Advised patient to consume fiber rich diet and laxative. Avoid straining and use sitz bath's. Referral placed to GI.

## 2022-11-27 NOTE — Patient Instructions (Addendum)
Referral placed to GI. Do not strain take over-the-counter stool softeners, increase fluid intake and consume high-fiber diet. You can also apply over-the-counter topical hemorrhoid cream for pain relieve. Use sitz bath.

## 2022-11-27 NOTE — Progress Notes (Signed)
Established Patient Office Visit  Subjective:  Patient ID: Jane Davidson, female    DOB: 12-29-1940  Age: 82 y.o. MRN: [id number]  CC:  Chief Complaint  Patient presents with   Acute Visit    Rectum bleeding/ rt foot pain     HPI  Jane Davidson presents for rectal bleeding.   Rectal Bleeding  The current episode started today. The problem has been unchanged. Pain severity now: at present but with defecation. The stool is described as soft. Prior successful therapies include laxatives. Associated symptoms include hemorrhoids and rectal pain. Pertinent negatives include no fever and no abdominal pain.      Past Medical History:  Diagnosis Date   Abnormal Pap smear of cervix 2001   Biopsy normal   Bursitis NEC    Cancer ([place])    skin   COPD (chronic obstructive pulmonary disease) ([place])    History of peripheral vascular disease 2001   s/p bilateral aorto-iliac-femoral bypass /[facility]   Hyperlipidemia    hypothyroid    Osteopenia 2009    T scores - 1.5 DEXA 2009   Screening for breast cancer May 2012   [place] mammogram   Tobacco abuse disorder    Vitamin D deficiency April 2011   replaced withDrsido for level of 11.3 ng/ml   Vitamin D deficiency April 2011   replaced withDrsido for level of 11.3 ng/ml    Past Surgical History:  Procedure Laterality Date   BREAST BIOPSY Left    benign   CAROTID ENDARTERECTOMY Right 2016   Leotis Pain   CATARACT EXTRACTION  Sept 2011   CHOLECYSTECTOMY     HERNIA REPAIR  Jun 2011   [place]    TUBAL LIGATION      Family History  Problem Relation Age of Onset   Hypertension Mother    Arthritis Mother    Diabetes Father    Dementia Sister    Breast cancer Neg Hx     Social History   Socioeconomic History   Marital status: Widowed    Spouse name: Not on file   Number of children: Not on file   Years of education: Not on file   Highest education level: Not on file  Occupational History   Not on file  Tobacco Use    Smoking status: Every Day    Packs/day: 1.04    Years: 40.00    Total pack years: 41.60    Types: Cigarettes   Smokeless tobacco: Never  Vaping Use   Vaping Use: Never used  Substance and Sexual Activity   Alcohol use: No   Drug use: No   Sexual activity: Never  Other Topics Concern   Not on file  Social History Narrative   Not on file   Social Determinants of Health   Financial Resource Strain: Low Risk  (10/17/2022)   Overall Financial Resource Strain (CARDIA)    Difficulty of Paying Living Expenses: Not very hard  Food Insecurity: No Food Insecurity (10/17/2022)   Hunger Vital Sign    Worried About Running Out of Food in the Last Year: Never true    Glen in the Last Year: Never true  Transportation Needs: No Transportation Needs (10/17/2022)   PRAPARE - Hydrologist (Medical): No    Lack of Transportation (Non-Medical): No  Physical Activity: Not on file  Stress: No Stress Concern Present (10/17/2022)   [place]  Feeling of Stress : Only a little  Social Connections: Unknown (10/17/2022)   Social Connection and Isolation Panel [NHANES]    Frequency of Communication with Friends and Family: More than three times a week    Frequency of Social Gatherings with Friends and Family: Once a week    Attends Religious Services: More than 4 times per year    Active Member of [employer] or Organizations: Yes    Attends Archivist Meetings: Not on file    Marital Status: Not on file  Intimate Partner Violence: Not At Risk (10/17/2022)   Humiliation, Afraid, Rape, and Kick questionnaire    Fear of Current or Ex-Partner: No    Emotionally Abused: No    Physically Abused: No    Sexually Abused: No     Outpatient Medications Prior to Visit  Medication Sig Dispense Refill   amLODipine (NORVASC) 10 MG tablet Take 1 tablet (10 mg total) by mouth daily. 90 tablet 1   apixaban  (ELIQUIS) 5 MG TABS tablet TAKE 1 TABLET(5 MG) BY MOUTH TWICE DAILY 60 tablet 5   atorvastatin (LIPITOR) 20 MG tablet Take 1 tablet (20 mg total) by mouth daily. 90 tablet 3   Calcium Carb-Cholecalciferol (CALCIUM 600 + D PO) Take 1 tablet by mouth daily. Calcium 600 mg and Vitamin D 800 IUs     clobetasol ointment (TEMOVATE) [id number] % Apply 1 application topically 2 (two) times daily. 30 g 5   fluticasone (FLONASE) 50 MCG/ACT nasal spray SHAKE LIQUID AND USE 2 SPRAYS IN EACH NOSTRIL DAILY 48 g 0   fluticasone-salmeterol (WIXELA INHUB) 100-50 MCG/ACT AEPB Inhale 1 puff into the lungs 2 (two) times daily. 180 each 1   levothyroxine (SYNTHROID) 88 MCG tablet TAKE 1 TABLET(88 MCG) BY MOUTH DAILY 90 tablet 3   loratadine (CLARITIN) 10 MG tablet Take 1 tablet (10 mg total) by mouth daily. 30 tablet 11   losartan (COZAAR) 100 MG tablet Take 1 tablet (100 mg total) by mouth daily. 90 tablet 1   metoprolol tartrate (LOPRESSOR) 25 MG tablet Take 1 tablet (25 mg total) by mouth 2 (two) times daily. 60 tablet 5   nitroGLYCERIN (NITROSTAT) 0.4 MG SL tablet Place 1 tablet (0.4 mg total) under the tongue every 5 (five) minutes as needed for chest pain. 50 tablet 3   sertraline (ZOLOFT) 50 MG tablet TAKE 1 TABLET(50 MG) BY MOUTH DAILY 90 tablet 1   traMADol (ULTRAM) 50 MG tablet Take 1 tablet (50 mg total) by mouth every 12 (twelve) hours as needed for moderate pain. 60 tablet 2   triamcinolone cream (KENALOG) 0.1 % Apply 1 Application topically 2 (two) times daily. 30 g 0   No facility-administered medications prior to visit.    Allergies  Allergen Reactions   Bee Pollen Other (See Comments)   Pollen Extract    Hctz [Hydrochlorothiazide]     Hyponatremia    Other Cough    Other reaction(s): Other (See Comments) Running nose    ROS Review of Systems  Constitutional:  Negative for fever.  Gastrointestinal:  Positive for hematochezia, hemorrhoids and rectal pain. Negative for abdominal pain.       Objective:    Physical Exam Exam conducted with a chaperone present.  Constitutional:      Appearance: Normal appearance.  HENT:     Right Ear: Tympanic membrane normal.     Left Ear: Tympanic membrane normal.  Cardiovascular:     Rate and Rhythm: Normal rate and regular rhythm.  Pulses: Normal pulses.     Heart sounds: Normal heart sounds.  Pulmonary:     Effort: Pulmonary effort is normal.     Breath sounds: Normal breath sounds.  Abdominal:     General: Bowel sounds are normal.     Palpations: Abdomen is soft.     Tenderness: There is no abdominal tenderness.  Genitourinary:    Exam position: Knee-chest position.     Comments: Reddish,  grape sized swelling of perianal area Neurological:     Mental Status: She is alert.     BP 110/70   Pulse 73   Temp 97.6 F (36.4 C) (Oral)   Ht 5' 4"$  (1.626 m)   Wt 133 lb 9.6 oz (60.6 kg)   SpO2 99%   BMI 22.93 kg/m  Wt Readings from Last 3 Encounters:  11/27/22 133 lb 9.6 oz (60.6 kg)  11/14/22 136 lb (61.7 kg)  11/13/22 136 lb (61.7 kg)     Health Maintenance  Topic Date Due   FOOT EXAM  09/18/2022   OPHTHALMOLOGY EXAM  10/21/2022   Lung Cancer Screening  12/12/2022 (Originally 08/17/2019)   COVID-19 Vaccine (4 - 2023-24 season) 12/13/2022 (Originally 06/13/2022)   HEMOGLOBIN A1C  04/15/2023   MAMMOGRAM  05/07/2023   Diabetic kidney evaluation - Urine ACR  10/16/2023   Medicare Annual Wellness (AWV)  10/18/2023   Diabetic kidney evaluation - eGFR measurement  11/14/2023   DTaP/Tdap/Td (3 - Td or Tdap) 10/23/2031   Pneumonia Vaccine 29+ Years old  Completed   INFLUENZA VACCINE  Completed   DEXA SCAN  Completed   Zoster Vaccines- Shingrix  Completed   HPV VACCINES  Aged Out    There are no preventive care reminders to display for this patient.  Lab Results  Component Value Date   TSH 1.64 09/18/2021   Lab Results  Component Value Date   WBC 7.5 11/13/2022   HGB 11.4 (L) 11/13/2022   HCT 32.4 (L)  11/13/2022   MCV 91.7 11/13/2022   PLT 266.0 11/13/2022   Lab Results  Component Value Date   NA 134 (L) 11/13/2022   K 4.2 11/13/2022   CO2 26 11/13/2022   GLUCOSE 106 (H) 11/13/2022   BUN 18 11/13/2022   CREATININE 0.81 11/13/2022   BILITOT 0.4 11/13/2022   ALKPHOS 56 11/13/2022   AST 17 11/13/2022   ALT 12 11/13/2022   PROT 6.6 11/18/2022   ALBUMIN 4.0 11/13/2022   CALCIUM 9.1 11/13/2022   ANIONGAP 6 04/08/2020   GFR 68.71 11/13/2022   Lab Results  Component Value Date   CHOL 115 10/15/2022   Lab Results  Component Value Date   HDL 53.60 10/15/2022   Lab Results  Component Value Date   LDLCALC 48 10/15/2022   Lab Results  Component Value Date   TRIG 70.0 10/15/2022   Lab Results  Component Value Date   CHOLHDL 2 10/15/2022   Lab Results  Component Value Date   HGBA1C 6.0 10/15/2022      Assessment & Plan:   Problem List Items Addressed This Visit       Cardiovascular and Mediastinum   External hemorrhoid - Primary    Left-sided external hemorrhoid. Advised patient to consume fiber rich diet and laxative. Avoid straining and use sitz bath's. Referral placed to GI.      Relevant Orders   Ambulatory referral to Gastroenterology   Other Visit Diagnoses     Skin irritation       Relevant  Medications   triamcinolone cream (KENALOG) 0.1 %        Meds ordered this encounter  Medications   triamcinolone cream (KENALOG) 0.1 %    Sig: Apply 1 Application topically 2 (two) times daily.    Dispense:  30 g    Refill:  0     Follow-up: Return if symptoms worsen or fail to improve.    Theresia Lo, NP

## 2022-12-01 ENCOUNTER — Ambulatory Visit: Payer: PPO | Admitting: Physical Therapy

## 2022-12-01 ENCOUNTER — Encounter: Payer: Self-pay | Admitting: Physical Therapy

## 2022-12-01 DIAGNOSIS — M79671 Pain in right foot: Secondary | ICD-10-CM

## 2022-12-01 NOTE — Therapy (Signed)
OUTPATIENT PHYSICAL THERAPY THORACOLUMBAR TREATMENT   Patient Name: Jane Davidson MRN: [id number] DOB:1941-10-04, 82 y.o., female Today's Date: 12/01/2022  END OF SESSION:  PT End of Session - 12/01/22 0955     Visit Number 2    Number of Visits 10    Date for PT Re-Evaluation 02/10/23    Authorization - Visit Number 2    Authorization - Number of Visits 10    Progress Note Due on Visit 10    PT Start Time 1000    PT Stop Time 1038    PT Time Calculation (min) 38 min    Activity Tolerance Patient tolerated treatment well    Behavior During Therapy [facility] for tasks assessed/performed              Past Medical History:  Diagnosis Date   Abnormal Pap smear of cervix 2001   Biopsy normal   Bursitis NEC    Cancer ([place])    skin   COPD (chronic obstructive pulmonary disease) ([place])    History of peripheral vascular disease 2001   s/p bilateral aorto-iliac-femoral bypass /[facility]   Hyperlipidemia    hypothyroid    Osteopenia 2009    T scores - 1.5 DEXA 2009   Screening for breast cancer May 2012   [place] mammogram   Tobacco abuse disorder    Vitamin D deficiency April 2011   replaced withDrsido for level of 11.3 ng/ml   Vitamin D deficiency April 2011   replaced withDrsido for level of 11.3 ng/ml   Past Surgical History:  Procedure Laterality Date   BREAST BIOPSY Left    benign   CAROTID ENDARTERECTOMY Right 2016   Leotis Pain   CATARACT EXTRACTION  Sept 2011   CHOLECYSTECTOMY     HERNIA REPAIR  Jun 2011   [place]    TUBAL LIGATION     Patient Active Problem List   Diagnosis Date Noted   External hemorrhoid 11/27/2022   Pain and swelling of right ankle 11/13/2022   Dermatitis 11/03/2022   Left sided sciatica 10/15/2022   Urinary urgency 04/08/2022   Major depressive disorder, single episode, in remission ([place]) 07/18/2021   Aortic atherosclerosis ([place]) Q000111Q   Gastritis, Helicobacter pylori Q000111Q   Acquired thrombophilia ([place]) 04/15/2020   PAF  (paroxysmal atrial fibrillation) ([place]) 04/07/2020   Degeneration of cervical intervertebral disc 05/28/2019   Type 2 diabetes mellitus with diabetic peripheral angiopathy without gangrene, without long-term current use of insulin ([place]) 05/28/2019   Chest pain at rest 08/15/2018   Hyponatremia 06/11/2018   Osteoarthritis of spine 03/01/2018   Chronic low back pain without sciatica 03/01/2018   Spinal stenosis of lumbosacral region 03/01/2018   Lumbar herniated disc 03/01/2018   Cardiac murmur 03/01/2018   Nausea in adult 03/01/2018   Dysuria 06/10/2017   Lumbar radiculopathy 06/04/2017   Atopic dermatitis 04/22/2017   Carotid stenosis 08/29/2016   Essential hypertension 02/29/2016   Chronic left hip pain 04/01/2015   Constipation 06/08/2014   Anemia 09/27/2013   Encounter for preventive health examination 03/27/2013   Screening for colon cancer 10/16/2011   Tobacco abuse counseling 08/09/2011   History of peripheral vascular disease    Osteoporosis of forearm    Tobacco abuse disorder    Vitamin D deficiency    Arrhythmia 07/05/2011   Hypothyroidism 07/05/2011   Varicose veins of left lower extremity with pain 07/05/2011   COPD (chronic obstructive pulmonary disease) ([place])    Hyperlipidemia    Screening for breast  cancer 02/11/2011    PCP: Derrel Nip MD  REFERRING PROVIDER: Derrel Nip MD  REFERRING DIAG: R foot pain  Rationale for Evaluation and Treatment: Rehabilitation  THERAPY DIAG:  Pain in right foot  ONSET DATE: April 2023  SUBJECTIVE:                                                                                                                                                                                           SUBJECTIVE STATEMENT: Pt reports continued R foot pain 5/10. 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 Order sent to physical therapy for L hip pain with LLE  radicular symptoms. She reports she has not had that hip pain she was referred for "for a while", and that she has R foot pain that is more pertinent to current dysfunction. Current L hip 0/10; reports R foot pain is 7/10. No pain in L hip over the past several weeks. R foot pain is at medial arch. Patient reports increased R foot pain with standing, walking >16mns, and amplified by walking on uneven ground in her yard. Pain in R foot is alleviated by tylenol. She is having swelling in R foot that is more so when she is up and moving around. She lives alone with her dog, completes her own household and community errands. Finds it difficult to gather sticks in her yard for her brother to mow d/t foot pain. Reports no falls in the past 6 months. Pt denies N/V, B&B changes, unexplained weight fluctuation, saddle paresthesia, fever, night sweats, or unrelenting night pain at this time.  New order sent for R foot pain   PAIN:  Are you having pain? Yes: NPRS scale: 7/10 Pain location: medial arch Pain description: nagging; step wrong then sharp Aggravating factors: walking, standing, walking on uneven ground Relieving factors: tylenol  PRECAUTIONS: None  WEIGHT BEARING RESTRICTIONS: No  FALLS:  Has patient fallen in last 6 months? No  LIVING ENVIRONMENT: Lives with: lives alone Lives in: House/apartment Stairs: Yes: External: 3-4 steps; none Has following equipment at home: None grab bars in bathroom, shower seat  OCCUPATION: retired; enjoys reading  PLOF: Independent  PATIENT GOALS: decrease pain   NEXT MD VISIT: 12/03/22 [place]  OBJECTIVE:   DIAGNOSTIC FINDINGS:  Xray R ankle 11/16/22 unremarkable  PATIENT SURVEYS:  FOTO next visit  SCREENING FOR RED FLAGS: Bowel or bladder incontinence: No Spinal tumors: No Cauda equina syndrome: No Compression fracture: No Abdominal aneurysm: No  COGNITION: Overall cognitive status: Within functional limits for tasks  assessed     SENSATION: WFL   EDEMA:  Visually present slightly R>L   POSTURE: bilat navicular drop R>L, midfoot pronation, bilat knee valgus, bilat hip IR  PALPATION: TTP at top of foot d/t swelling TTP with concordant pain to mdial arch foot intrinsics Minimal pain with palpable tension and trigger points to gastroc  LOWER EXTREMITY ROM:  Active ROM Right eval Left eval  Hip flexion    Hip extension 50% limited bilat  Hip abduction wnl wnl  Hip adduction    Hip internal rotation 25% limited bilat  Hip external rotation wnl wnl  Knee flexion wnl wnl  Knee extension wnl wnl  Ankle dorsiflexion 4 13  Ankle plantarflexion WNL WNL  Ankle eversion 22 30  Ankle inversion 28 49   (Blank rows = not tested) GT passive ROM L 80 R 77   LOWER EXTREMITY MMT:  MMT Right eval Left eval  Hip flexion 4+ 4+  Hip extension 3+ 3+  Hip abduction 4- 4-  Hip adduction    Hip internal rotation 5 (within available range) 5 (within available range)  Hip external rotation 4 4  Knee flexion 4+ 4+  Knee extension    Ankle dorsiflexion 3+ 4+  Ankle plantarflexion    Ankle inversion 5 5  Ankle eversion 5 5   (Blank rows = not tested)  SLE heel raises R: 23 L: 23  LOWER EXTREMITY SPECIAL TESTS:  Ankle special tests: Homan's test: negative Windlass +R foot  FUNCTIONAL TESTS:  10 meter walk test: self selected  0.44ms; fastest 1.177m  GAIT: Distance walked: 2022msistive device utilized: None Level of assistance: Complete Independence Comments: ankle eversion used through R foot swing for minimal foot clearance, decreased R stance with decreased L step length, decreased speed   TODAY'S TREATMENT:                                                                                                                              DATE: 11/25/22 Ther-Ex Nustep 10seat 14UE L3 5mi46mfor gentle strengthening  SLS RLE: 10sec L 3sec  Narrow BOS wt'd ball toss/catch to rebounder x12;  tandem R x12 Lx12 with supervision for safety; ankle strategy used  Squat to mat table x12 with difficulty R heel raise and preventing knee valgus + bilat hip IR ; with plates under heels x12;624THLth plate under heel and GTB at knees for tactile feedback x10  Alt forward lunge x12 with cuing for large step back for increased static balance demand  Standing gastroc stretch x45sec GT stretch x30sec   PATIENT EDUCATION:  Education details: Patient was educated on diagnosis, anatomy and pathology involved, prognosis, role of PT, and was given an HEP, demonstrating exercise with proper form following verbal and tactile cues, and was given a paper hand out to continue exercise at home. Pt was educated on and agreed to plan of care.  Person educated: Patient Education method: Explanation, Demonstration, and  Verbal cues Education comprehension: verbalized understanding, returned demonstration, and verbal cues required  HOME EXERCISE PROGRAM: Access Code: [email or web address]  - Standing Gastroc Stretch  - 1-3 x daily - 7 x weekly - 30-60sec hold - Seated Self Great Toe Stretch  - 1-3 x daily - 7 x weekly - 30-60sec hold - Standing Single Leg Stance with Counter Support  - 1-2 x daily - 7 x weekly - 15sec hold  ASSESSMENT:  CLINICAL IMPRESSION: PT initiated therex progression for increased ankle mobility and strength, with focus on motor control of hip/knee/ankle with success. Pt with multimodal cuing needed for functional squat for alignment of hip, knee, ankle with decent carry over- difficulty with midfoot pronation, knee valgus and hip IR. Pt with minimal increased pain with forward lunge, subsides with modification. Would benefit from skilled PT to address above deficits and promote optimal return to PLOF.    OBJECTIVE IMPAIRMENTS: Abnormal gait, decreased activity tolerance, decreased balance, decreased endurance, decreased mobility, difficulty walking, decreased ROM, decreased strength, increased  fascial restrictions, increased muscle spasms, impaired flexibility, impaired tone, improper body mechanics, postural dysfunction, and pain.   ACTIVITY LIMITATIONS: carrying, lifting, standing, squatting, transfers, dressing, and locomotion level  PARTICIPATION LIMITATIONS: meal prep, cleaning, laundry, shopping, community activity, and yard work  PERSONAL FACTORS: Age, Fitness, Past/current experiences, Time since onset of injury/illness/exacerbation, and 3+ comorbidities: OA, chronic LBP, COPD, HTN, DM2  are also affecting patient's functional outcome.   REHAB POTENTIAL: Good  CLINICAL DECISION MAKING: Evolving/moderate complexity  EVALUATION COMPLEXITY: Moderate   GOALS: Goals reviewed with patient? Yes  SHORT TERM GOALS: Target date: 12/26/22 Pt will be independent with HEP in order to improve strength and balance in order to decrease fall risk and improve function at home and work.  Baseline: HEP given  Goal status: INITIAL    LONG TERM GOALS: Target date: 02/10/23  Patient will increase FOTO score to 79 to demonstrate predicted increase in functional mobility to complete ADLs Baseline: next visit Goal status: INITIAL  2.  Pt will decrease worst pain as reported on NPRS by at least 3 points in order to demonstrate clinically significant reduction in pain.  Baseline: 7/10 Goal status: INITIAL  3.  Pt will increase 10MWT to at least 1.2 m/s in order to demonstrate clinically significant independence in community ambulation.   Baseline:  Goal status: INITIAL  4.  Pt will demonstrate SLS of 18 sec or more bilat in order to demonstrate age matched normal static balance needed to reduce fall risk Baseline: 3sec bilat Goal status: INITIAL     PLAN:  PT FREQUENCY: 1-2x/week  PT DURATION: 8 weeks  PLANNED INTERVENTIONS: Therapeutic exercises, Therapeutic activity, Neuromuscular re-education, Balance training, Gait training, Patient/Family education, Self Care, Joint  mobilization, Joint manipulation, Stair training, Aquatic Therapy, Dry Needling, Electrical stimulation, Spinal manipulation, Spinal mobilization, Cryotherapy, Moist heat, Traction, Ultrasound, Manual therapy, and Re-evaluation  PLAN FOR NEXT SESSION: Bell Arthur DPT  Durwin Reges, PT 12/01/2022, 12:31 PM

## 2022-12-03 ENCOUNTER — Ambulatory Visit
Admission: RE | Admit: 2022-12-03 | Discharge: 2022-12-03 | Disposition: A | Discharge status: Home / Self Care | Payer: PPO | Source: Ambulatory Visit | Attending: Nurse Practitioner | Admitting: Nurse Practitioner

## 2022-12-03 DIAGNOSIS — M25471 Effusion, right ankle: Secondary | ICD-10-CM

## 2022-12-03 DIAGNOSIS — M79604 Pain in right leg: Secondary | ICD-10-CM | POA: Diagnosis not present

## 2022-12-03 DIAGNOSIS — M25571 Pain in right ankle and joints of right foot: Secondary | ICD-10-CM | POA: Diagnosis not present

## 2022-12-08 ENCOUNTER — Ambulatory Visit (INDEPENDENT_AMBULATORY_CARE_PROVIDER_SITE_OTHER): Payer: PPO | Admitting: Internal Medicine

## 2022-12-08 ENCOUNTER — Encounter: Payer: Self-pay | Admitting: Internal Medicine

## 2022-12-08 VITALS — BP 128/56 | HR 61 | Temp 98.0°F | Ht 64.0 in | Wt 135.2 lb

## 2022-12-08 DIAGNOSIS — M810 Age-related osteoporosis without current pathological fracture: Secondary | ICD-10-CM | POA: Diagnosis not present

## 2022-12-08 DIAGNOSIS — E559 Vitamin D deficiency, unspecified: Secondary | ICD-10-CM | POA: Diagnosis not present

## 2022-12-08 MED ORDER — APIXABAN 5 MG PO TABS: ORAL_TABLET | ORAL | 5 refills | Status: DC

## 2022-12-08 MED ORDER — METOPROLOL TARTRATE 25 MG PO TABS: 25.0000 mg | ORAL_TABLET | Freq: Two times a day (BID) | ORAL | 5 refills | Status: DC

## 2022-12-08 NOTE — Progress Notes (Signed)
Subjective:  Patient ID: Jane Davidson, female    DOB: [birth year]  Age: 82 y.o. MRN: [id number]  CC: The primary encounter diagnosis was Osteoporosis of forearm. A diagnosis of Vitamin D deficiency was also pertinent to this visit.   HPI Jane Davidson presents for  Chief Complaint  Patient presents with  . discuss treatment options for osteoporosis   1) osteoporosis: at her last visit one month ago,  we reviewed the options of management of her osteoporosis and I recommended PROLIA GIVEN HER history of CAD and dysphagia,  She returns to to revisit the discussion , because based on the opinion of a friend of her daughter who takes "collagen" for her osteoporosis,  she  has decided against Prolia .  She has not done any research about osteoporosis.  She takes calcium and vitamin  D supplements. She has no history of fractures.      Outpatient Medications Prior to Visit  Medication Sig Dispense Refill  . amLODipine (NORVASC) 10 MG tablet Take 1 tablet (10 mg total) by mouth daily. 90 tablet 1  . atorvastatin (LIPITOR) 20 MG tablet Take 1 tablet (20 mg total) by mouth daily. 90 tablet 3  . Calcium Carb-Cholecalciferol (CALCIUM 600 + D PO) Take 1 tablet by mouth daily. Calcium 600 mg and Vitamin D 800 IUs    . clobetasol ointment (TEMOVATE) [id number] % Apply 1 application topically 2 (two) times daily. 30 g 5  . fluticasone (FLONASE) 50 MCG/ACT nasal spray SHAKE LIQUID AND USE 2 SPRAYS IN EACH NOSTRIL DAILY 48 g 0  . fluticasone-salmeterol (WIXELA INHUB) 100-50 MCG/ACT AEPB Inhale 1 puff into the lungs 2 (two) times daily. 180 each 1  . levothyroxine (SYNTHROID) 88 MCG tablet TAKE 1 TABLET(88 MCG) BY MOUTH DAILY 90 tablet 3  . loratadine (CLARITIN) 10 MG tablet Take 1 tablet (10 mg total) by mouth daily. 30 tablet 11  . losartan (COZAAR) 100 MG tablet Take 1 tablet (100 mg total) by mouth daily. 90 tablet 1  . nitroGLYCERIN (NITROSTAT) 0.4 MG SL tablet Place 1 tablet (0.4 mg total) under  the tongue every 5 (five) minutes as needed for chest pain. 50 tablet 3  . sertraline (ZOLOFT) 50 MG tablet TAKE 1 TABLET(50 MG) BY MOUTH DAILY 90 tablet 1  . traMADol (ULTRAM) 50 MG tablet Take 1 tablet (50 mg total) by mouth every 12 (twelve) hours as needed for moderate pain. 60 tablet 2  . triamcinolone cream (KENALOG) 0.1 % Apply 1 Application topically 2 (two) times daily. 30 g 0  . apixaban (ELIQUIS) 5 MG TABS tablet TAKE 1 TABLET(5 MG) BY MOUTH TWICE DAILY 60 tablet 5  . metoprolol tartrate (LOPRESSOR) 25 MG tablet Take 1 tablet (25 mg total) by mouth 2 (two) times daily. 60 tablet 5   No facility-administered medications prior to visit.    Review of Systems;  Patient denies headache, fevers, malaise, unintentional weight loss, skin rash, eye pain, sinus congestion and sinus pain, sore throat, dysphagia,  hemoptysis , cough, dyspnea, wheezing, chest pain, palpitations, orthopnea, edema, abdominal pain, nausea, melena, diarrhea, constipation, flank pain, dysuria, hematuria, urinary  Frequency, nocturia, numbness, tingling, seizures,  Focal weakness, Loss of consciousness,  Tremor, insomnia, depression, anxiety, and suicidal ideation.      Objective:  BP (!) 128/56   Pulse 61   Temp 98 F (36.7 C) (Oral)   Ht '5\' 4"'$  (1.626 m)   Wt 135 lb 3.2 oz (61.3 kg)   SpO2 98%  BMI 23.21 kg/m   BP Readings from Last 3 Encounters:  12/08/22 (!) 128/56  11/27/22 110/70  11/14/22 124/70    Wt Readings from Last 3 Encounters:  12/08/22 135 lb 3.2 oz (61.3 kg)  11/27/22 133 lb 9.6 oz (60.6 kg)  11/14/22 136 lb (61.7 kg)    Physical Exam Vitals reviewed.  Constitutional:      General: She is not in acute distress.    Appearance: Normal appearance. She is normal weight. She is not ill-appearing, toxic-appearing or diaphoretic.  HENT:     Head: Normocephalic.  Eyes:     General: No scleral icterus.       Right eye: No discharge.        Left eye: No discharge.      Conjunctiva/sclera: Conjunctivae normal.  Cardiovascular:     Rate and Rhythm: Normal rate and regular rhythm.     Heart sounds: Normal heart sounds.  Pulmonary:     Effort: Pulmonary effort is normal. No respiratory distress.     Breath sounds: Normal breath sounds.  Musculoskeletal:        General: Normal range of motion.  Skin:    General: Skin is warm and dry.  Neurological:     General: No focal deficit present.     Mental Status: She is alert and oriented to person, place, and time. Mental status is at baseline.  Psychiatric:        Mood and Affect: Mood normal.        Behavior: Behavior normal.        Thought Content: Thought content normal.        Judgment: Judgment normal.   Lab Results  Component Value Date   HGBA1C 6.0 10/15/2022   HGBA1C 5.8 05/07/2022   HGBA1C 5.9 09/18/2021    Lab Results  Component Value Date   CREATININE 0.81 11/13/2022   CREATININE 0.76 10/15/2022   CREATININE 0.85 05/07/2022    Lab Results  Component Value Date   WBC 7.5 11/13/2022   HGB 11.4 (L) 11/13/2022   HCT 32.4 (L) 11/13/2022   PLT 266.0 11/13/2022   GLUCOSE 106 (H) 11/13/2022   CHOL 115 10/15/2022   TRIG 70.0 10/15/2022   HDL 53.60 10/15/2022   LDLDIRECT 49.0 10/15/2022   LDLCALC 48 10/15/2022   ALT 12 11/13/2022   AST 17 11/13/2022   NA 134 (L) 11/13/2022   K 4.2 11/13/2022   CL 101 11/13/2022   CREATININE 0.81 11/13/2022   BUN 18 11/13/2022   CO2 26 11/13/2022   TSH 1.64 09/18/2021   INR 1.0 04/08/2020   HGBA1C 6.0 10/15/2022   MICROALBUR 1.7 10/15/2022    US Venous Img Lower Unilateral Right (DVT)  Result Date: 12/03/2022 CLINICAL DATA:  Right lower extremity pain and edema. History of smoking. Patient is on anticoagulation. Evaluate for DVT. EXAM: RIGHT LOWER EXTREMITY VENOUS DOPPLER ULTRASOUND TECHNIQUE: Gray-scale sonography with graded compression, as well as color Doppler and duplex ultrasound were performed to evaluate the lower extremity deep venous  systems from the level of the common femoral vein and including the common femoral, femoral, profunda femoral, popliteal and calf veins including the posterior tibial, peroneal and gastrocnemius veins when visible. The superficial great saphenous vein was also interrogated. Spectral Doppler was utilized to evaluate flow at rest and with distal augmentation maneuvers in the common femoral, femoral and popliteal veins. COMPARISON:  Right lower extremity venous Doppler ultrasound-01/13/2008 (negative). FINDINGS: Contralateral Common Femoral Vein: Respiratory phasicity is normal and  symmetric with the symptomatic side. No evidence of thrombus. Normal compressibility. Common Femoral Vein: No evidence of thrombus. Normal compressibility, respiratory phasicity and response to augmentation. Saphenofemoral Junction: No evidence of thrombus. Normal compressibility and flow on color Doppler imaging. Profunda Femoral Vein: No evidence of thrombus. Normal compressibility and flow on color Doppler imaging. Femoral Vein: No evidence of thrombus. Normal compressibility, respiratory phasicity and response to augmentation. Popliteal Vein: No evidence of thrombus. Normal compressibility, respiratory phasicity and response to augmentation. Calf Veins: No evidence of thrombus. Normal compressibility and flow on color Doppler imaging. Superficial Great Saphenous Vein: No evidence of thrombus. Normal compressibility. Other Findings:  None. IMPRESSION: No evidence of DVT within the right lower extremity. Electronically Signed   By: Sandi Mariscal M.D.   On: 12/03/2022 14:59    Assessment & Plan:  .Osteoporosis of forearm Assessment & Plan: Reviewd progression of bone  loss on recent DEXA with forearm and hip both affected..  We rreviewed her limited  options:  evista and alendronate both contraindicated due to history of PAF, CAD and dysphagia.  Recommending Prolia but she has decided against the medication based on the opinion of a  friend of her daughter's.   We reviewed the available options of Reclast , which could be arranged through endocrinology referral. She remains contemplative .  Checking vitamin D level    Vitamin D deficiency -     VITAMIN D 25 Hydroxy (Vit-D Deficiency, Fractures)  Other orders -     Apixaban; TAKE 1 TABLET(5 MG) BY MOUTH TWICE DAILY  Dispense: 60 tablet; Refill: 5 -     Metoprolol Tartrate; Take 1 tablet (25 mg total) by mouth 2 (two) times daily.  Dispense: 60 tablet; Refill: 5     I provided 30 minutes of face-to-face time during this encounter reviewing patient's last visit with me, patient's  most recent visit with cardiology,  nephrology,  and neurology,  recent surgical and non surgical procedures, previous  labs and imaging studies, counseling on currently addressed issues,  and post visit ordering to diagnostics and therapeutics .   Follow-up: No follow-ups on file.   Crecencio Mc, MD

## 2022-12-08 NOTE — Patient Instructions (Addendum)
Collagen does not build bone. Calcium and vitamin D are important , but they do not STOP bone loss   If you would like to consider another form of therapy ,  consider RECLAST ,  this is like Fosamax but is given once a year as in IV infusion .

## 2022-12-08 NOTE — Telephone Encounter (Signed)
Based on office visit today, Prolia chart has been archived.

## 2022-12-08 NOTE — Assessment & Plan Note (Addendum)
Reviewd progression of bone  loss on recent DEXA with forearm and hip both affected..  We rreviewed her limited  options:  evista and alendronate both contraindicated due to history of PAF, CAD and dysphagia.  Recommending Prolia but she has decided against the medication based on the opinion of a friend of her daughter's.   We reviewed the available options of Reclast , which could be arranged through endocrinology referral. She remains contemplative .  Checking vitamin D level

## 2022-12-09 DIAGNOSIS — H52223 Regular astigmatism, bilateral: Secondary | ICD-10-CM | POA: Diagnosis not present

## 2022-12-09 DIAGNOSIS — H524 Presbyopia: Secondary | ICD-10-CM | POA: Diagnosis not present

## 2022-12-09 DIAGNOSIS — H5203 Hypermetropia, bilateral: Secondary | ICD-10-CM | POA: Diagnosis not present

## 2022-12-09 DIAGNOSIS — H43813 Vitreous degeneration, bilateral: Secondary | ICD-10-CM | POA: Diagnosis not present

## 2022-12-09 DIAGNOSIS — Z7984 Long term (current) use of oral hypoglycemic drugs: Secondary | ICD-10-CM | POA: Diagnosis not present

## 2022-12-09 DIAGNOSIS — E119 Type 2 diabetes mellitus without complications: Secondary | ICD-10-CM | POA: Diagnosis not present

## 2022-12-09 DIAGNOSIS — Z961 Presence of intraocular lens: Secondary | ICD-10-CM | POA: Diagnosis not present

## 2022-12-09 LAB — HM DIABETES EYE EXAM

## 2022-12-09 LAB — VITAMIN D 25 HYDROXY (VIT D DEFICIENCY, FRACTURES): VITD: 27.57 ng/mL — ABNORMAL LOW (ref 30.00–100.00)

## 2022-12-15 ENCOUNTER — Encounter: Payer: Self-pay | Admitting: Physical Therapy

## 2022-12-15 ENCOUNTER — Other Ambulatory Visit: Payer: Self-pay | Admitting: Nurse Practitioner

## 2022-12-15 ENCOUNTER — Ambulatory Visit: Payer: PPO | Attending: Internal Medicine | Admitting: Physical Therapy

## 2022-12-15 DIAGNOSIS — M79671 Pain in right foot: Secondary | ICD-10-CM | POA: Insufficient documentation

## 2022-12-15 DIAGNOSIS — R238 Other skin changes: Secondary | ICD-10-CM

## 2022-12-15 NOTE — Therapy (Signed)
OUTPATIENT PHYSICAL THERAPY THORACOLUMBAR TREATMENT   Patient Name: Jane Davidson MRN: [id number] DOB:17-Jun-1941, 82 y.o., female Today's Date: 12/15/2022  END OF SESSION:  PT End of Session - 12/15/22 1138     Visit Number 3    Number of Visits 10    Date for PT Re-Evaluation 02/10/23    Authorization - Visit Number 3    Authorization - Number of Visits 10    Progress Note Due on Visit 10    PT Start Time [id number]    PT Stop Time 1202    PT Time Calculation (min) 38 min    Activity Tolerance Patient tolerated treatment well    Behavior During Therapy [facility] for tasks assessed/performed               Past Medical History:  Diagnosis Date   Abnormal Pap smear of cervix 2001   Biopsy normal   Bursitis NEC    Cancer ([place])    skin   COPD (chronic obstructive pulmonary disease) ([place])    History of peripheral vascular disease 2001   s/p bilateral aorto-iliac-femoral bypass /[place]   Hyperlipidemia    hypothyroid    Osteopenia 2009    T scores - 1.5 DEXA 2009   Screening for breast cancer May 2012   [place] mammogram   Tobacco abuse disorder    Vitamin D deficiency April 2011   replaced withDrsido for level of 11.3 ng/ml   Vitamin D deficiency April 2011   replaced withDrsido for level of 11.3 ng/ml   Past Surgical History:  Procedure Laterality Date   BREAST BIOPSY Left    benign   CAROTID ENDARTERECTOMY Right 2016   Leotis Pain   CATARACT EXTRACTION  Sept 2011   CHOLECYSTECTOMY     HERNIA REPAIR  Jun 2011   [place]    TUBAL LIGATION     Patient Active Problem List   Diagnosis Date Noted   External hemorrhoid 11/27/2022   Pain and swelling of right ankle 11/13/2022   Dermatitis 11/03/2022   Left sided sciatica 10/15/2022   Urinary urgency 04/08/2022   Major depressive disorder, single episode, in remission ([place]) 07/18/2021   Aortic atherosclerosis ([place]) [id number]   Gastritis, Helicobacter pylori Q000111Q   Acquired thrombophilia ([place]) 04/15/2020    PAF (paroxysmal atrial fibrillation) ([place]) 04/07/2020   Degeneration of cervical intervertebral disc 05/28/2019   Type 2 diabetes mellitus with diabetic peripheral angiopathy without gangrene, without long-term current use of insulin ([place]) 05/28/2019   Chest pain at rest 08/15/2018   Hyponatremia 06/11/2018   Osteoarthritis of spine 03/01/2018   Chronic low back pain without sciatica 03/01/2018   Spinal stenosis of lumbosacral region 03/01/2018   Lumbar herniated disc 03/01/2018   Cardiac murmur 03/01/2018   Nausea in adult 03/01/2018   Dysuria 06/10/2017   Lumbar radiculopathy 06/04/2017   Atopic dermatitis 04/22/2017   Carotid stenosis 08/29/2016   Essential hypertension 02/29/2016   Chronic left hip pain 04/01/2015   Constipation 06/08/2014   Anemia 09/27/2013   Encounter for preventive health examination 03/27/2013   Screening for colon cancer 10/16/2011   Tobacco abuse counseling 08/09/2011   History of peripheral vascular disease    Osteoporosis of forearm    Tobacco abuse disorder    Vitamin D deficiency    Arrhythmia 07/05/2011   Hypothyroidism 07/05/2011   Varicose veins of left lower extremity with pain 07/05/2011   COPD (chronic obstructive pulmonary disease) ([place])    Hyperlipidemia    Screening for  breast cancer 02/11/2011    PCP: Derrel Nip MD  REFERRING PROVIDER: Derrel Nip MD  REFERRING DIAG: R foot pain  Rationale for Evaluation and Treatment: Rehabilitation  THERAPY DIAG:  Pain in right foot  ONSET DATE: April 2023  SUBJECTIVE:                                                                                                                                                                                           SUBJECTIVE STATEMENT: Pt reports continued R foot pain 5/10. Has completed HEP over the weekend.   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 Order sent to physical therapy for L hip pain with LLE radicular  symptoms. She reports she has not had that hip pain she was referred for "for a while", and that she has R foot pain that is more pertinent to current dysfunction. Current L hip 0/10; reports R foot pain is 7/10. No pain in L hip over the past several weeks. R foot pain is at medial arch. Patient reports increased R foot pain with standing, walking >75mns, and amplified by walking on uneven ground in her yard. Pain in R foot is alleviated by tylenol. She is having swelling in R foot that is more so when she is up and moving around. She lives alone with her dog, completes her own household and community errands. Finds it difficult to gather sticks in her yard for her brother to mow d/t foot pain. Reports no falls in the past 6 months. Pt denies N/V, B&B changes, unexplained weight fluctuation, saddle paresthesia, fever, night sweats, or unrelenting night pain at this time.  New order sent for R foot pain   PAIN:  Are you having pain? Yes: NPRS scale: 7/10 Pain location: medial arch Pain description: nagging; step wrong then sharp Aggravating factors: walking, standing, walking on uneven ground Relieving factors: tylenol  PRECAUTIONS: None  WEIGHT BEARING RESTRICTIONS: No  FALLS:  Has patient fallen in last 6 months? No  LIVING ENVIRONMENT: Lives with: lives alone Lives in: House/apartment Stairs: Yes: External: 3-4 steps; none Has following equipment at home: None grab bars in bathroom, shower seat  OCCUPATION: retired; enjoys reading  PLOF: Independent  PATIENT GOALS: decrease pain   NEXT MD VISIT: 12/03/22 [place]  OBJECTIVE:   DIAGNOSTIC FINDINGS:  Xray R ankle 11/16/22 unremarkable  PATIENT SURVEYS:  FOTO next visit  SCREENING FOR RED FLAGS: Bowel or bladder incontinence: No Spinal tumors: No Cauda equina syndrome: No Compression fracture: No Abdominal aneurysm: No  COGNITION: Overall cognitive status: Within functional limits for tasks  assessed  SENSATION: WFL   EDEMA:  Visually present slightly R>L   POSTURE: bilat navicular drop R>L, midfoot pronation, bilat knee valgus, bilat hip IR  PALPATION: TTP at top of foot d/t swelling TTP with concordant pain to mdial arch foot intrinsics Minimal pain with palpable tension and trigger points to gastroc  LOWER EXTREMITY ROM:  Active ROM Right eval Left eval  Hip flexion    Hip extension 50% limited bilat  Hip abduction wnl wnl  Hip adduction    Hip internal rotation 25% limited bilat  Hip external rotation wnl wnl  Knee flexion wnl wnl  Knee extension wnl wnl  Ankle dorsiflexion 4 13  Ankle plantarflexion WNL WNL  Ankle eversion 22 30  Ankle inversion 28 49   (Blank rows = not tested) GT passive ROM L 80 R 77   LOWER EXTREMITY MMT:  MMT Right eval Left eval  Hip flexion 4+ 4+  Hip extension 3+ 3+  Hip abduction 4- 4-  Hip adduction    Hip internal rotation 5 (within available range) 5 (within available range)  Hip external rotation 4 4  Knee flexion 4+ 4+  Knee extension    Ankle dorsiflexion 3+ 4+  Ankle plantarflexion    Ankle inversion 5 5  Ankle eversion 5 5   (Blank rows = not tested)  SLE heel raises R: 23 L: 23  LOWER EXTREMITY SPECIAL TESTS:  Ankle special tests: Homan's test: negative Windlass +R foot  FUNCTIONAL TESTS:  10 meter walk test: self selected  0.75ms; fastest 1.119m  GAIT: Distance walked: 2073msistive device utilized: None Level of assistance: Complete [place] Comments: ankle eversion used through R foot swing for minimal foot clearance, decreased R stance with decreased L step length, decreased speed   TODAY'S TREATMENT:                                                                                                                              DATE: 11/25/22 Ther-Ex Nustep 10seat 14UE L3 5mi48mfor gentle strengthening  RLE SLS unable; with LLE toe touch only x10; with LLE toe touch narrow BOS on  foam 1x 15secH with intermittent HHA for balance  Squat to mat table x12 with good carry over of previous session cuing for technique; on foam pad x12  Alt forward lunge onto foam pad 2x 12 with cuing for technique with good carry over   Side stepping RTB at ankles 2x 20ft57fh cuing for neutral hip/knee/ankle with good carry over  Standing gastroc stretch on step x45sec Ant tib stretch 45sec hold    PATIENT EDUCATION:  Education details: Patient was educated on diagnosis, anatomy and pathology involved, prognosis, role of PT, and was given an HEP, demonstrating exercise with proper form following verbal and tactile cues, and was given a paper hand out to continue exercise at home. Pt was educated on and agreed to plan of care.  Person educated: Patient Education method: Explanation, Demonstration,  and Verbal cues Education comprehension: verbalized understanding, returned demonstration, and verbal cues required  HOME EXERCISE PROGRAM: Access Code: [email or web address]  - Standing Gastroc Stretch  - 1-3 x daily - 7 x weekly - 30-60sec hold - Seated Self Great Toe Stretch  - 1-3 x daily - 7 x weekly - 30-60sec hold - Standing Single Leg Stance with Counter Support  - 1-2 x daily - 7 x weekly - 15sec hold  ASSESSMENT:  CLINICAL IMPRESSION: PT initiated therex progression for increased ankle mobility and strength, with focus on motor control of hip/knee/ankle with success. Pt is able to comply with all multimodal cuing for proper technique of therex with good motivation throughout session. Pt with minimal increased pain with forward lunge, subsides with modification. Would benefit from skilled PT to address above deficits and promote optimal return to PLOF.    OBJECTIVE IMPAIRMENTS: Abnormal gait, decreased activity tolerance, decreased balance, decreased endurance, decreased mobility, difficulty walking, decreased ROM, decreased strength, increased fascial restrictions, increased muscle spasms,  impaired flexibility, impaired tone, improper body mechanics, postural dysfunction, and pain.   ACTIVITY LIMITATIONS: carrying, lifting, standing, squatting, transfers, dressing, and locomotion level  PARTICIPATION LIMITATIONS: meal prep, cleaning, laundry, shopping, community activity, and yard work  PERSONAL FACTORS: Age, Fitness, Past/current experiences, Time since onset of injury/illness/exacerbation, and 3+ comorbidities: OA, chronic LBP, COPD, HTN, DM2  are also affecting patient's functional outcome.   REHAB POTENTIAL: Good  CLINICAL DECISION MAKING: Evolving/moderate complexity  EVALUATION COMPLEXITY: Moderate   GOALS: Goals reviewed with patient? Yes  SHORT TERM GOALS: Target date: 12/26/22 Pt will be independent with HEP in order to improve strength and balance in order to decrease fall risk and improve function at home and work.  Baseline: HEP given  Goal status: INITIAL    LONG TERM GOALS: Target date: 02/10/23  Patient will increase FOTO score to 79 to demonstrate predicted increase in functional mobility to complete ADLs Baseline: next visit Goal status: INITIAL  2.  Pt will decrease worst pain as reported on NPRS by at least 3 points in order to demonstrate clinically significant reduction in pain.  Baseline: 7/10 Goal status: INITIAL  3.  Pt will increase 10MWT to at least 1.2 m/s in order to demonstrate clinically significant independence in community ambulation.   Baseline:  Goal status: INITIAL  4.  Pt will demonstrate SLS of 18 sec or more bilat in order to demonstrate age matched normal static balance needed to reduce fall risk Baseline: 3sec bilat Goal status: INITIAL     PLAN:  PT FREQUENCY: 1-2x/week  PT DURATION: 8 weeks  PLANNED INTERVENTIONS: Therapeutic exercises, Therapeutic activity, Neuromuscular re-education, Balance training, Gait training, Patient/Family education, Self Care, Joint mobilization, Joint manipulation, Stair  training, Aquatic Therapy, Dry Needling, Electrical stimulation, Spinal manipulation, Spinal mobilization, Cryotherapy, Moist heat, Traction, Ultrasound, Manual therapy, and Re-evaluation  PLAN FOR NEXT SESSION: [place] DPT  Durwin Reges, PT 12/15/2022, 3:33 PM

## 2022-12-17 ENCOUNTER — Ambulatory Visit: Payer: PPO | Admitting: Physical Therapy

## 2022-12-17 DIAGNOSIS — M7989 Other specified soft tissue disorders: Secondary | ICD-10-CM | POA: Diagnosis not present

## 2022-12-17 DIAGNOSIS — M79671 Pain in right foot: Secondary | ICD-10-CM | POA: Diagnosis not present

## 2022-12-18 ENCOUNTER — Ambulatory Visit: Payer: PPO | Admitting: Nurse Practitioner

## 2022-12-22 ENCOUNTER — Ambulatory Visit: Payer: PPO | Admitting: Physical Therapy

## 2022-12-24 ENCOUNTER — Ambulatory Visit: Payer: PPO | Admitting: Physical Therapy

## 2022-12-24 DIAGNOSIS — M79671 Pain in right foot: Secondary | ICD-10-CM | POA: Diagnosis not present

## 2022-12-29 ENCOUNTER — Ambulatory Visit: Payer: PPO | Admitting: Physical Therapy

## 2022-12-29 ENCOUNTER — Encounter: Payer: Self-pay | Admitting: Physical Therapy

## 2022-12-29 DIAGNOSIS — M79671 Pain in right foot: Secondary | ICD-10-CM | POA: Diagnosis not present

## 2022-12-29 NOTE — Therapy (Signed)
OUTPATIENT PHYSICAL THERAPY THORACOLUMBAR TREATMENT   Patient Name: Jane Davidson MRN: [id number] DOB:Mar 05, 1941, 82 y.o., female Today's Date: 12/31/2022  END OF SESSION:       Past Medical History:  Diagnosis Date   Abnormal Pap smear of cervix 2001   Biopsy normal   Bursitis NEC    Cancer ([place])    skin   COPD (chronic obstructive pulmonary disease) ([place])    History of peripheral vascular disease 2001   s/p bilateral aorto-iliac-femoral bypass /[place]   Hyperlipidemia    hypothyroid    Osteopenia 2009    T scores - 1.5 DEXA 2009   Screening for breast cancer May 2012   [place] mammogram   Tobacco abuse disorder    Vitamin D deficiency April 2011   replaced withDrsido for level of 11.3 ng/ml   Vitamin D deficiency April 2011   replaced withDrsido for level of 11.3 ng/ml   Past Surgical History:  Procedure Laterality Date   BREAST BIOPSY Left    benign   CAROTID ENDARTERECTOMY Right 2016   Leotis Pain   CATARACT EXTRACTION  Sept 2011   CHOLECYSTECTOMY     HERNIA REPAIR  Jun 2011   [place]    TUBAL LIGATION     Patient Active Problem List   Diagnosis Date Noted   External hemorrhoid 11/27/2022   Pain and swelling of right ankle 11/13/2022   Dermatitis 11/03/2022   Left sided sciatica 10/15/2022   Urinary urgency 04/08/2022   Major depressive disorder, single episode, in remission ([place]) 07/18/2021   Aortic atherosclerosis ([place]) Q000111Q   Gastritis, Helicobacter pylori Q000111Q   Acquired thrombophilia ([place]) 04/15/2020   PAF (paroxysmal atrial fibrillation) ([place]) 04/07/2020   Degeneration of cervical intervertebral disc 05/28/2019   Type 2 diabetes mellitus with diabetic peripheral angiopathy without gangrene, without long-term current use of insulin ([place]) 05/28/2019   Chest pain at rest 08/15/2018   Hyponatremia 06/11/2018   Osteoarthritis of spine 03/01/2018   Chronic low back pain without sciatica 03/01/2018   Spinal stenosis of lumbosacral  region 03/01/2018   Lumbar herniated disc 03/01/2018   Cardiac murmur 03/01/2018   Nausea in adult 03/01/2018   Dysuria 06/10/2017   Lumbar radiculopathy 06/04/2017   Atopic dermatitis 04/22/2017   Carotid stenosis 08/29/2016   Essential hypertension 02/29/2016   Chronic left hip pain 04/01/2015   Constipation 06/08/2014   Anemia 09/27/2013   Encounter for preventive health examination 03/27/2013   Screening for colon cancer 10/16/2011   Tobacco abuse counseling 08/09/2011   History of peripheral vascular disease    Osteoporosis of forearm    Tobacco abuse disorder    Vitamin D deficiency    Arrhythmia 07/05/2011   Hypothyroidism 07/05/2011   Varicose veins of left lower extremity with pain 07/05/2011   COPD (chronic obstructive pulmonary disease) ([place])    Hyperlipidemia    Screening for breast cancer 02/11/2011    PCP: Derrel Nip MD  REFERRING PROVIDER: Derrel Nip MD  REFERRING DIAG: R foot pain  Rationale for Evaluation and Treatment: Rehabilitation  THERAPY DIAG:  Pain in right foot  ONSET DATE: April 2023  SUBJECTIVE:  SUBJECTIVE STATEMENT: Pt reports continued R foot pain 5/10, and it is feeling swollen . Has completed HEP over the weekend.   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 Order sent to physical therapy for L hip pain with LLE radicular symptoms. She reports she has not had that hip pain she was referred for "for a while", and that she has R foot pain that is more pertinent to current dysfunction. Current L hip 0/10; reports R foot pain is 7/10. No pain in L hip over the past several weeks. R foot pain is at medial arch. Patient reports increased R foot pain with standing, walking >62mins, and amplified by walking on uneven ground in her yard.  Pain in R foot is alleviated by tylenol. She is having swelling in R foot that is more so when she is up and moving around. She lives alone with her dog, completes her own household and community errands. Finds it difficult to gather sticks in her yard for her brother to mow d/t foot pain. Reports no falls in the past 6 months. Pt denies N/V, B&B changes, unexplained weight fluctuation, saddle paresthesia, fever, night sweats, or unrelenting night pain at this time.  New order sent for R foot pain   PAIN:  Are you having pain? Yes: NPRS scale: 7/10 Pain location: medial arch Pain description: nagging; step wrong then sharp Aggravating factors: walking, standing, walking on uneven ground Relieving factors: tylenol  PRECAUTIONS: None  WEIGHT BEARING RESTRICTIONS: No  FALLS:  Has patient fallen in last 6 months? No  LIVING ENVIRONMENT: Lives with: lives alone Lives in: House/apartment Stairs: Yes: External: 3-4 steps; none Has following equipment at home: None grab bars in bathroom, shower seat  OCCUPATION: retired; enjoys reading  PLOF: Independent  PATIENT GOALS: decrease pain   NEXT MD VISIT: 12/03/22 [place] for DVT  OBJECTIVE:   DIAGNOSTIC FINDINGS:  Xray R ankle 11/16/22 unremarkable  PATIENT SURVEYS:  FOTO next visit  SCREENING FOR RED FLAGS: Bowel or bladder incontinence: No Spinal tumors: No Cauda equina syndrome: No Compression fracture: No Abdominal aneurysm: No  COGNITION: Overall cognitive status: Within functional limits for tasks assessed     SENSATION: WFL   EDEMA:  Visually present slightly R>L   POSTURE: bilat navicular drop R>L, midfoot pronation, bilat knee valgus, bilat hip IR  PALPATION: TTP at top of foot d/t swelling TTP with concordant pain to mdial arch foot intrinsics Minimal pain with palpable tension and trigger points to gastroc  LOWER EXTREMITY ROM:  Active ROM Right eval Left eval  Hip flexion    Hip extension 50% limited  bilat  Hip abduction wnl wnl  Hip adduction    Hip internal rotation 25% limited bilat  Hip external rotation wnl wnl  Knee flexion wnl wnl  Knee extension wnl wnl  Ankle dorsiflexion 4 13  Ankle plantarflexion WNL WNL  Ankle eversion 22 30  Ankle inversion 28 49   (Blank rows = not tested) GT passive ROM L 80 R 77   LOWER EXTREMITY MMT:  MMT Right eval Left eval  Hip flexion 4+ 4+  Hip extension 3+ 3+  Hip abduction 4- 4-  Hip adduction    Hip internal rotation 5 (within available range) 5 (within available range)  Hip external rotation 4 4  Knee flexion 4+ 4+  Knee extension    Ankle dorsiflexion 3+ 4+  Ankle plantarflexion    Ankle inversion 5 5  Ankle  eversion 5 5   (Blank rows = not tested)  SLE heel raises R: 23 L: 23  LOWER EXTREMITY SPECIAL TESTS:  Ankle special tests: Homan's test: negative Windlass +R foot  FUNCTIONAL TESTS:  10 meter walk test: self selected  0.30m/s; fastest 1.39m/s  GAIT: Distance walked: 45m Assistive device utilized: None Level of assistance: Complete Independence Comments: ankle eversion used through R foot swing for minimal foot clearance, decreased R stance with decreased L step length, decreased speed   TODAY'S TREATMENT:                                                                                                                              DATE: 11/25/22 Ther-Ex Nustep 10seat 14UE L3 46mins for gentle strengthening  TRX squat for increased ankle mobility 2x 12 with good carry over of initial cuing/technique  Step ups onto 6in step 2x 12 each LE with cuing for technique with good carry over  Alt forward lunge onto foam pad 2x 12 with cuing for technique with good carry over   [place]ster walk RTB at ankles x30ft fwd; x54ft bwd with SBA for safety  Standing gastroc stretch on step x45sec Ant tib stretch 45sec hold    PATIENT EDUCATION:  Education details: Patient was educated on diagnosis, anatomy and pathology  involved, prognosis, role of PT, and was given an HEP, demonstrating exercise with proper form following verbal and tactile cues, and was given a paper hand out to continue exercise at home. Pt was educated on and agreed to plan of care.  Person educated: Patient Education method: Explanation, Demonstration, and Verbal cues Education comprehension: verbalized understanding, returned demonstration, and verbal cues required  HOME EXERCISE PROGRAM: Access Code: [email or web address]  - Standing Gastroc Stretch  - 1-3 x daily - 7 x weekly - 30-60sec hold - Seated Self Great Toe Stretch  - 1-3 x daily - 7 x weekly - 30-60sec hold - Standing Single Leg Stance with Counter Support  - 1-2 x daily - 7 x weekly - 15sec hold  ASSESSMENT:  CLINICAL IMPRESSION: PT initiated therex progression for increased ankle mobility and strength, with focus on motor control of hip/knee/ankle with success. Pt is able to comply with all multimodal cuing for proper technique of therex with good motivation throughout session.. Would benefit from skilled PT to address above deficits and promote optimal return to PLOF.    OBJECTIVE IMPAIRMENTS: Abnormal gait, decreased activity tolerance, decreased balance, decreased endurance, decreased mobility, difficulty walking, decreased ROM, decreased strength, increased fascial restrictions, increased muscle spasms, impaired flexibility, impaired tone, improper body mechanics, postural dysfunction, and pain.   ACTIVITY LIMITATIONS: carrying, lifting, standing, squatting, transfers, dressing, and locomotion level  PARTICIPATION LIMITATIONS: meal prep, cleaning, laundry, shopping, community activity, and yard work  PERSONAL FACTORS: Age, Fitness, Past/current experiences, Time since onset of injury/illness/exacerbation, and 3+ comorbidities: OA, chronic LBP, COPD, HTN, DM2  are also affecting patient's functional outcome.   REHAB POTENTIAL: Good  CLINICAL DECISION MAKING: Evolving/moderate  complexity  EVALUATION COMPLEXITY: Moderate   GOALS: Goals reviewed with patient? Yes  SHORT TERM GOALS: Target date: 12/26/22 Pt will be independent with HEP in order to improve strength and balance in order to decrease fall risk and improve function at home and work.  Baseline: HEP given  Goal status: INITIAL    LONG TERM GOALS: Target date: 02/10/23  Patient will increase FOTO score to 79 to demonstrate predicted increase in functional mobility to complete ADLs Baseline: next visit Goal status: INITIAL  2.  Pt will decrease worst pain as reported on NPRS by at least 3 points in order to demonstrate clinically significant reduction in pain.  Baseline: 7/10 Goal status: INITIAL  3.  Pt will increase 10MWT to at least 1.2 m/s in order to demonstrate clinically significant independence in community ambulation.   Baseline:  Goal status: INITIAL  4.  Pt will demonstrate SLS of 18 sec or more bilat in order to demonstrate age matched normal static balance needed to reduce fall risk Baseline: 3sec bilat Goal status: INITIAL     PLAN:  PT FREQUENCY: 1-2x/week  PT DURATION: 8 weeks  PLANNED INTERVENTIONS: Therapeutic exercises, Therapeutic activity, Neuromuscular re-education, Balance training, Gait training, Patient/Family education, Self Care, Joint mobilization, Joint manipulation, Stair training, Aquatic Therapy, Dry Needling, Electrical stimulation, Spinal manipulation, Spinal mobilization, Cryotherapy, Moist heat, Traction, Ultrasound, Manual therapy, and Re-evaluation  PLAN FOR NEXT SESSION: [place] DPT  Durwin Reges, PT 12/31/2022, 1:44 PM

## 2022-12-31 ENCOUNTER — Ambulatory Visit: Payer: PPO | Admitting: Physical Therapy

## 2022-12-31 DIAGNOSIS — M79671 Pain in right foot: Secondary | ICD-10-CM | POA: Diagnosis not present

## 2022-12-31 NOTE — Therapy (Signed)
OUTPATIENT PHYSICAL THERAPY THORACOLUMBAR TREATMENT   Patient Name: Jane Davidson MRN: [id number] DOB:[birth year], 82 y.o., female Today's Date: 12/31/2022  END OF SESSION:  PT End of Session - 12/31/22 1348     Visit Number 5    Number of Visits 10    Date for PT Re-Evaluation 02/10/23    Authorization - Visit Number 5    Authorization - Number of Visits 10    Progress Note Due on Visit 10    PT Start Time [id number]    PT Stop Time 1424    PT Time Calculation (min) 39 min    Activity Tolerance Patient tolerated treatment well    Behavior During Therapy [facility] for tasks assessed/performed                 Past Medical History:  Diagnosis Date   Abnormal Pap smear of cervix 2001   Biopsy normal   Bursitis NEC    Cancer ([place])    skin   COPD (chronic obstructive pulmonary disease) ([place])    History of peripheral vascular disease 2001   s/p bilateral aorto-iliac-femoral bypass /[place]   Hyperlipidemia    hypothyroid    Osteopenia 2009    T scores - 1.5 DEXA 2009   Screening for breast cancer May 2012   [place] mammogram   Tobacco abuse disorder    Vitamin D deficiency April 2011   replaced withDrsido for level of 11.3 ng/ml   Vitamin D deficiency April 2011   replaced withDrsido for level of 11.3 ng/ml   Past Surgical History:  Procedure Laterality Date   BREAST BIOPSY Left    benign   CAROTID ENDARTERECTOMY Right 2016   Leotis Pain   CATARACT EXTRACTION  Sept 2011   CHOLECYSTECTOMY     HERNIA REPAIR  Jun 2011   [place]    TUBAL LIGATION     Patient Active Problem List   Diagnosis Date Noted   External hemorrhoid 11/27/2022   Pain and swelling of right ankle 11/13/2022   Dermatitis 11/03/2022   Left sided sciatica 10/15/2022   Urinary urgency 04/08/2022   Major depressive disorder, single episode, in remission ([place]) 07/18/2021   Aortic atherosclerosis ([place]) Q000111Q   Gastritis, Helicobacter pylori Q000111Q   Acquired thrombophilia ([place]) 04/15/2020    PAF (paroxysmal atrial fibrillation) ([place]) 04/07/2020   Degeneration of cervical intervertebral disc 05/28/2019   Type 2 diabetes mellitus with diabetic peripheral angiopathy without gangrene, without long-term current use of insulin ([place]) 05/28/2019   Chest pain at rest 08/15/2018   Hyponatremia 06/11/2018   Osteoarthritis of spine 03/01/2018   Chronic low back pain without sciatica 03/01/2018   Spinal stenosis of lumbosacral region 03/01/2018   Lumbar herniated disc 03/01/2018   Cardiac murmur 03/01/2018   Nausea in adult 03/01/2018   Dysuria 06/10/2017   Lumbar radiculopathy 06/04/2017   Atopic dermatitis 04/22/2017   Carotid stenosis 08/29/2016   Essential hypertension 02/29/2016   Chronic left hip pain 04/01/2015   Constipation 06/08/2014   Anemia 09/27/2013   Encounter for preventive health examination 03/27/2013   Screening for colon cancer 10/16/2011   Tobacco abuse counseling 08/09/2011   History of peripheral vascular disease    Osteoporosis of forearm    Tobacco abuse disorder    Vitamin D deficiency    Arrhythmia 07/05/2011   Hypothyroidism 07/05/2011   Varicose veins of left lower extremity with pain 07/05/2011   COPD (chronic obstructive pulmonary disease) ([place])    Hyperlipidemia  Screening for breast cancer 02/11/2011    PCP: Derrel Nip MD  REFERRING PROVIDER: Derrel Nip MD  REFERRING DIAG: R foot pain  Rationale for Evaluation and Treatment: Rehabilitation  THERAPY DIAG:  Pain in right foot  ONSET DATE: April 2023  SUBJECTIVE:                                                                                                                                                                                           SUBJECTIVE STATEMENT: Pt reports continued R foot pain 5/10, and it is feeling swollen . Has completed HEP over the weekend.   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 Order sent to physical therapy for L  hip pain with LLE radicular symptoms. She reports she has not had that hip pain she was referred for "for a while", and that she has R foot pain that is more pertinent to current dysfunction. Current L hip 0/10; reports R foot pain is 7/10. No pain in L hip over the past several weeks. R foot pain is at medial arch. Patient reports increased R foot pain with standing, walking >31mins, and amplified by walking on uneven ground in her yard. Pain in R foot is alleviated by tylenol. She is having swelling in R foot that is more so when she is up and moving around. She lives alone with her dog, completes her own household and community errands. Finds it difficult to gather sticks in her yard for her brother to mow d/t foot pain. Reports no falls in the past 6 months. Pt denies N/V, B&B changes, unexplained weight fluctuation, saddle paresthesia, fever, night sweats, or unrelenting night pain at this time.  New order sent for R foot pain   PAIN:  Are you having pain? Yes: NPRS scale: 7/10 Pain location: medial arch Pain description: nagging; step wrong then sharp Aggravating factors: walking, standing, walking on uneven ground Relieving factors: tylenol  PRECAUTIONS: None  WEIGHT BEARING RESTRICTIONS: No  FALLS:  Has patient fallen in last 6 months? No  LIVING ENVIRONMENT: Lives with: lives alone Lives in: House/apartment Stairs: Yes: External: 3-4 steps; none Has following equipment at home: None grab bars in bathroom, shower seat  OCCUPATION: retired; enjoys reading  PLOF: Independent  PATIENT GOALS: decrease pain   NEXT MD VISIT: 12/03/22 [place] for DVT  OBJECTIVE:   DIAGNOSTIC FINDINGS:  Xray R ankle 11/16/22 unremarkable  PATIENT SURVEYS:  FOTO next visit  SCREENING FOR RED FLAGS: Bowel or bladder incontinence: No Spinal tumors: No Cauda equina syndrome: No Compression fracture: No Abdominal aneurysm: No  COGNITION: Overall cognitive  status: Within functional limits for tasks  assessed     SENSATION: WFL   EDEMA:  Visually present slightly R>L   POSTURE: bilat navicular drop R>L, midfoot pronation, bilat knee valgus, bilat hip IR  PALPATION: TTP at top of foot d/t swelling TTP with concordant pain to mdial arch foot intrinsics Minimal pain with palpable tension and trigger points to gastroc  LOWER EXTREMITY ROM:  Active ROM Right eval Left eval  Hip flexion    Hip extension 50% limited bilat  Hip abduction wnl wnl  Hip adduction    Hip internal rotation 25% limited bilat  Hip external rotation wnl wnl  Knee flexion wnl wnl  Knee extension wnl wnl  Ankle dorsiflexion 4 13  Ankle plantarflexion WNL WNL  Ankle eversion 22 30  Ankle inversion 28 49   (Blank rows = not tested) GT passive ROM L 80 R 77   LOWER EXTREMITY MMT:  MMT Right eval Left eval  Hip flexion 4+ 4+  Hip extension 3+ 3+  Hip abduction 4- 4-  Hip adduction    Hip internal rotation 5 (within available range) 5 (within available range)  Hip external rotation 4 4  Knee flexion 4+ 4+  Knee extension    Ankle dorsiflexion 3+ 4+  Ankle plantarflexion    Ankle inversion 5 5  Ankle eversion 5 5   (Blank rows = not tested)  SLE heel raises R: 23 L: 23  LOWER EXTREMITY SPECIAL TESTS:  Ankle special tests: Homan's test: negative Windlass +R foot  FUNCTIONAL TESTS:  10 meter walk test: self selected  0.62m/s; fastest 1.26m/s  GAIT: Distance walked: 5m Assistive device utilized: None Level of assistance: Complete Independence Comments: ankle eversion used through R foot swing for minimal foot clearance, decreased R stance with decreased L step length, decreased speed   TODAY'S TREATMENT:                                                                                                                              DATE: 11/25/22 Ther-Ex Nustep 10seat 14UE L3 51mins for gentle strengthening  Rocker board PF <> DF 2x 12 with max cuing for technique without excessive  hip movement with good carry over  STS from chair on foam 2x 12/10 with min cuing for technique with good carry over  Alt heel taps off 2in foam with occassional UE support 2x 12 with min cuing for eccentric control with good carry over  Step ups onto 6in step 2x 12 each LE with cuing for technique with good carry over  Standing gastroc stretch on step x45sec Ant tib stretch 45sec hold    PATIENT EDUCATION:  Education details: Patient was educated on diagnosis, anatomy and pathology involved, prognosis, role of PT, and was given an HEP, demonstrating exercise with proper form following verbal and tactile cues, and was given a paper hand out to continue exercise at home. Pt was educated on and agreed to  plan of care.  Person educated: Patient Education method: Explanation, Demonstration, and Verbal cues Education comprehension: verbalized understanding, returned demonstration, and verbal cues required  HOME EXERCISE PROGRAM: Access Code: [email or web address]  - Standing Gastroc Stretch  - 1-3 x daily - 7 x weekly - 30-60sec hold - Seated Self Great Toe Stretch  - 1-3 x daily - 7 x weekly - 30-60sec hold - Standing Single Leg Stance with Counter Support  - 1-2 x daily - 7 x weekly - 15sec hold  ASSESSMENT:  CLINICAL IMPRESSION: PT initiated therex progression for increased ankle mobility and strength, with focus on motor control of hip/knee/ankle with success. Pt is able to comply with all multimodal cuing for proper technique of therex with good motivation throughout session.. Would benefit from skilled PT to address above deficits and promote optimal return to PLOF.    OBJECTIVE IMPAIRMENTS: Abnormal gait, decreased activity tolerance, decreased balance, decreased endurance, decreased mobility, difficulty walking, decreased ROM, decreased strength, increased fascial restrictions, increased muscle spasms, impaired flexibility, impaired tone, improper body mechanics, postural dysfunction, and  pain.   ACTIVITY LIMITATIONS: carrying, lifting, standing, squatting, transfers, dressing, and locomotion level  PARTICIPATION LIMITATIONS: meal prep, cleaning, laundry, shopping, community activity, and yard work  PERSONAL FACTORS: Age, Fitness, Past/current experiences, Time since onset of injury/illness/exacerbation, and 3+ comorbidities: OA, chronic LBP, COPD, HTN, DM2  are also affecting patient's functional outcome.   REHAB POTENTIAL: Good  CLINICAL DECISION MAKING: Evolving/moderate complexity  EVALUATION COMPLEXITY: Moderate   GOALS: Goals reviewed with patient? Yes  SHORT TERM GOALS: Target date: 12/26/22 Pt will be independent with HEP in order to improve strength and balance in order to decrease fall risk and improve function at home and work.  Baseline: HEP given  Goal status: INITIAL    LONG TERM GOALS: Target date: 02/10/23  Patient will increase FOTO score to 79 to demonstrate predicted increase in functional mobility to complete ADLs Baseline: next visit Goal status: INITIAL  2.  Pt will decrease worst pain as reported on NPRS by at least 3 points in order to demonstrate clinically significant reduction in pain.  Baseline: 7/10 Goal status: INITIAL  3.  Pt will increase 10MWT to at least 1.2 m/s in order to demonstrate clinically significant independence in community ambulation.   Baseline:  Goal status: INITIAL  4.  Pt will demonstrate SLS of 18 sec or more bilat in order to demonstrate age matched normal static balance needed to reduce fall risk Baseline: 3sec bilat Goal status: INITIAL     PLAN:  PT FREQUENCY: 1-2x/week  PT DURATION: 8 weeks  PLANNED INTERVENTIONS: Therapeutic exercises, Therapeutic activity, Neuromuscular re-education, Balance training, Gait training, Patient/Family education, Self Care, Joint mobilization, Joint manipulation, Stair training, Aquatic Therapy, Dry Needling, Electrical stimulation, Spinal manipulation, Spinal  mobilization, Cryotherapy, Moist heat, Traction, Ultrasound, Manual therapy, and Re-evaluation  PLAN FOR NEXT SESSION: [place] DPT  Durwin Reges, PT 12/31/2022, 2:29 PM

## 2023-01-05 ENCOUNTER — Ambulatory Visit: Payer: PPO | Admitting: Physical Therapy

## 2023-01-05 ENCOUNTER — Encounter: Payer: Self-pay | Admitting: Physical Therapy

## 2023-01-05 ENCOUNTER — Other Ambulatory Visit: Payer: Self-pay | Admitting: Internal Medicine

## 2023-01-05 DIAGNOSIS — M79671 Pain in right foot: Secondary | ICD-10-CM | POA: Diagnosis not present

## 2023-01-05 NOTE — Therapy (Signed)
OUTPATIENT PHYSICAL THERAPY THORACOLUMBAR TREATMENT   Patient Name: Jane Davidson MRN: [id number] DOB:[birth year], 82 y.o., female Today's Date: 01/05/2023  END OF SESSION:  PT End of Session - 01/05/23 1346     Visit Number 6    Number of Visits 10    Date for PT Re-Evaluation 02/10/23    Authorization - Visit Number 6    Authorization - Number of Visits 10    Progress Note Due on Visit 10    PT Start Time [id number]    PT Stop Time [id number]    PT Time Calculation (min) 40 min    Activity Tolerance Patient tolerated treatment well    Behavior During Therapy [facility] for tasks assessed/performed                  Past Medical History:  Diagnosis Date   Abnormal Pap smear of cervix 2001   Biopsy normal   Bursitis NEC    Cancer ([place])    skin   COPD (chronic obstructive pulmonary disease) ([place])    History of peripheral vascular disease 2001   s/p bilateral aorto-iliac-femoral bypass /[place]ke   Hyperlipidemia    hypothyroid    Osteopenia 2009    T scores - 1.5 DEXA 2009   Screening for breast cancer May 2012   [place] mammogram   Tobacco abuse disorder    Vitamin D deficiency April 2011   replaced withDrsido for level of 11.3 ng/ml   Vitamin D deficiency April 2011   replaced withDrsido for level of 11.3 ng/ml   Past Surgical History:  Procedure Laterality Date   BREAST BIOPSY Left    benign   CAROTID ENDARTERECTOMY Right 2016   Leotis Pain   CATARACT EXTRACTION  Sept 2011   CHOLECYSTECTOMY     HERNIA REPAIR  Jun 2011   [place]    TUBAL LIGATION     Patient Active Problem List   Diagnosis Date Noted   External hemorrhoid 11/27/2022   Pain and swelling of right ankle 11/13/2022   Dermatitis 11/03/2022   Left sided sciatica 10/15/2022   Urinary urgency 04/08/2022   Major depressive disorder, single episode, in remission ([place]) 07/18/2021   Aortic atherosclerosis ([place]) Q000111Q   Gastritis, Helicobacter pylori Q000111Q   Acquired thrombophilia ([place])  04/15/2020   PAF (paroxysmal atrial fibrillation) ([place]) 04/07/2020   Degeneration of cervical intervertebral disc 05/28/2019   Type 2 diabetes mellitus with diabetic peripheral angiopathy without gangrene, without long-term current use of insulin ([place]) 05/28/2019   Chest pain at rest 08/15/2018   Hyponatremia 06/11/2018   Osteoarthritis of spine 03/01/2018   Chronic low back pain without sciatica 03/01/2018   Spinal stenosis of lumbosacral region 03/01/2018   Lumbar herniated disc 03/01/2018   Cardiac murmur 03/01/2018   Nausea in adult 03/01/2018   Dysuria 06/10/2017   Lumbar radiculopathy 06/04/2017   Atopic dermatitis 04/22/2017   Carotid stenosis 08/29/2016   Essential hypertension 02/29/2016   Chronic left hip pain 04/01/2015   Constipation 06/08/2014   Anemia 09/27/2013   Encounter for preventive health examination 03/27/2013   Screening for colon cancer 10/16/2011   Tobacco abuse counseling 08/09/2011   History of peripheral vascular disease    Osteoporosis of forearm    Tobacco abuse disorder    Vitamin D deficiency    Arrhythmia 07/05/2011   Hypothyroidism 07/05/2011   Varicose veins of left lower extremity with pain 07/05/2011   COPD (chronic obstructive pulmonary disease) ([place])    Hyperlipidemia  Screening for breast cancer 02/11/2011    PCP: Derrel Nip MD  REFERRING PROVIDER: Derrel Nip MD  REFERRING DIAG: R foot pain  Rationale for Evaluation and Treatment: Rehabilitation  THERAPY DIAG:  Pain in right foot  ONSET DATE: April 2023  SUBJECTIVE:                                                                                                                                                                                           SUBJECTIVE STATEMENT: Pt reports she has not spoke with PCP about swelling in R foot. 1/10 pain today. 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 Order sent to physical  therapy for L hip pain with LLE radicular symptoms. She reports she has not had that hip pain she was referred for "for a while", and that she has R foot pain that is more pertinent to current dysfunction. Current L hip 0/10; reports R foot pain is 7/10. No pain in L hip over the past several weeks. R foot pain is at medial arch. Patient reports increased R foot pain with standing, walking >22mins, and amplified by walking on uneven ground in her yard. Pain in R foot is alleviated by tylenol. She is having swelling in R foot that is more so when she is up and moving around. She lives alone with her dog, completes her own household and community errands. Finds it difficult to gather sticks in her yard for her brother to mow d/t foot pain. Reports no falls in the past 6 months. Pt denies N/V, B&B changes, unexplained weight fluctuation, saddle paresthesia, fever, night sweats, or unrelenting night pain at this time.  New order sent for R foot pain   PAIN:  Are you having pain? Yes: NPRS scale: 7/10 Pain location: medial arch Pain description: nagging; step wrong then sharp Aggravating factors: walking, standing, walking on uneven ground Relieving factors: tylenol  PRECAUTIONS: None  WEIGHT BEARING RESTRICTIONS: No  FALLS:  Has patient fallen in last 6 months? No  LIVING ENVIRONMENT: Lives with: lives alone Lives in: House/apartment Stairs: Yes: External: 3-4 steps; none Has following equipment at home: None grab bars in bathroom, shower seat  OCCUPATION: retired; enjoys reading  PLOF: Independent  PATIENT GOALS: decrease pain   NEXT MD VISIT: 12/03/22 [place] for DVT  OBJECTIVE:   DIAGNOSTIC FINDINGS:  Xray R ankle 11/16/22 unremarkable  PATIENT SURVEYS:  FOTO next visit  SCREENING FOR RED FLAGS: Bowel or bladder incontinence: No Spinal tumors: No Cauda equina syndrome: No Compression fracture: No Abdominal aneurysm: No  COGNITION: Overall  cognitive status: Within functional  limits for tasks assessed     SENSATION: WFL   EDEMA:  Visually present slightly R>L   POSTURE: bilat navicular drop R>L, midfoot pronation, bilat knee valgus, bilat hip IR  PALPATION: TTP at top of foot d/t swelling TTP with concordant pain to mdial arch foot intrinsics Minimal pain with palpable tension and trigger points to gastroc  LOWER EXTREMITY ROM:  Active ROM Right eval Left eval  Hip flexion    Hip extension 50% limited bilat  Hip abduction wnl wnl  Hip adduction    Hip internal rotation 25% limited bilat  Hip external rotation wnl wnl  Knee flexion wnl wnl  Knee extension wnl wnl  Ankle dorsiflexion 4 13  Ankle plantarflexion WNL WNL  Ankle eversion 22 30  Ankle inversion 28 49   (Blank rows = not tested) GT passive ROM L 80 R 77   LOWER EXTREMITY MMT:  MMT Right eval Left eval  Hip flexion 4+ 4+  Hip extension 3+ 3+  Hip abduction 4- 4-  Hip adduction    Hip internal rotation 5 (within available range) 5 (within available range)  Hip external rotation 4 4  Knee flexion 4+ 4+  Knee extension    Ankle dorsiflexion 3+ 4+  Ankle plantarflexion    Ankle inversion 5 5  Ankle eversion 5 5   (Blank rows = not tested)  SLE heel raises R: 23 L: 23  LOWER EXTREMITY SPECIAL TESTS:  Ankle special tests: Homan's test: negative Windlass +R foot  FUNCTIONAL TESTS:  10 meter walk test: self selected  0.83m/s; fastest 1.32m/s  GAIT: Distance walked: 21m Assistive device utilized: None Level of assistance: Complete Independence Comments: ankle eversion used through R foot swing for minimal foot clearance, decreased R stance with decreased L step length, decreased speed   TODAY'S TREATMENT:                                                                                                                              DATE: 11/25/22 Ther-Ex Nustep 10seat 14UE L3 74mins for gentle strengthening  Rocker board PF <> DF x15; side to side x15;  with max cuing  for technique without excessive hip movement with good carry over  Squat to chair touch with heel raise at stand 2x 10 with good carry over of demo   Alt lateral lunge 2x 12 with cuing for "big" return step with 2sec hold in SLS  Step ups onto 6in step 2x 12 each LE with cuing for technique with good carry over  Standing gastroc stretch on step x45sec Ant tib stretch 45sec hold    PATIENT EDUCATION:  Education details: Patient was educated on diagnosis, anatomy and pathology involved, prognosis, role of PT, and was given an HEP, demonstrating exercise with proper form following verbal and tactile cues, and was given a paper hand out to continue exercise at home. Pt was educated on and agreed  to plan of care.  Person educated: Patient Education method: Explanation, Demonstration, and Verbal cues Education comprehension: verbalized understanding, returned demonstration, and verbal cues required  HOME EXERCISE PROGRAM: Access Code: [email or web address]  - Standing Gastroc Stretch  - 1-3 x daily - 7 x weekly - 30-60sec hold - Seated Self Great Toe Stretch  - 1-3 x daily - 7 x weekly - 30-60sec hold - Standing Single Leg Stance with Counter Support  - 1-2 x daily - 7 x weekly - 15sec hold  ASSESSMENT:  CLINICAL IMPRESSION: PT initiated therex progression for increased ankle mobility and strength, with focus on motor control of hip/knee/ankle with success. Pt is able to comply with all multimodal cuing for proper technique of therex with good motivation throughout session.. Would benefit from skilled PT to address above deficits and promote optimal return to PLOF.    OBJECTIVE IMPAIRMENTS: Abnormal gait, decreased activity tolerance, decreased balance, decreased endurance, decreased mobility, difficulty walking, decreased ROM, decreased strength, increased fascial restrictions, increased muscle spasms, impaired flexibility, impaired tone, improper body mechanics, postural dysfunction, and pain.    ACTIVITY LIMITATIONS: carrying, lifting, standing, squatting, transfers, dressing, and locomotion level  PARTICIPATION LIMITATIONS: meal prep, cleaning, laundry, shopping, community activity, and yard work  PERSONAL FACTORS: Age, Fitness, Past/current experiences, Time since onset of injury/illness/exacerbation, and 3+ comorbidities: OA, chronic LBP, COPD, HTN, DM2  are also affecting patient's functional outcome.   REHAB POTENTIAL: Good  CLINICAL DECISION MAKING: Evolving/moderate complexity  EVALUATION COMPLEXITY: Moderate   GOALS: Goals reviewed with patient? Yes  SHORT TERM GOALS: Target date: 12/26/22 Pt will be independent with HEP in order to improve strength and balance in order to decrease fall risk and improve function at home and work.  Baseline: HEP given  Goal status: INITIAL    LONG TERM GOALS: Target date: 02/10/23  Patient will increase FOTO score to 79 to demonstrate predicted increase in functional mobility to complete ADLs Baseline: next visit Goal status: INITIAL  2.  Pt will decrease worst pain as reported on NPRS by at least 3 points in order to demonstrate clinically significant reduction in pain.  Baseline: 7/10 Goal status: INITIAL  3.  Pt will increase 10MWT to at least 1.2 m/s in order to demonstrate clinically significant independence in community ambulation.   Baseline:  Goal status: INITIAL  4.  Pt will demonstrate SLS of 18 sec or more bilat in order to demonstrate age matched normal static balance needed to reduce fall risk Baseline: 3sec bilat Goal status: INITIAL     PLAN:  PT FREQUENCY: 1-2x/week  PT DURATION: 8 weeks  PLANNED INTERVENTIONS: Therapeutic exercises, Therapeutic activity, Neuromuscular re-education, Balance training, Gait training, Patient/Family education, Self Care, Joint mobilization, Joint manipulation, Stair training, Aquatic Therapy, Dry Needling, Electrical stimulation, Spinal manipulation, Spinal  mobilization, Cryotherapy, Moist heat, Traction, Ultrasound, Manual therapy, and Re-evaluation  PLAN FOR NEXT SESSION: [place] DPT  Durwin Reges, PT 01/05/2023, 2:42 PM

## 2023-01-07 ENCOUNTER — Encounter: Payer: Self-pay | Admitting: Physical Therapy

## 2023-01-07 ENCOUNTER — Ambulatory Visit: Payer: PPO | Admitting: Physical Therapy

## 2023-01-07 DIAGNOSIS — M79671 Pain in right foot: Secondary | ICD-10-CM

## 2023-01-07 NOTE — Therapy (Signed)
OUTPATIENT PHYSICAL THERAPY THORACOLUMBAR TREATMENT   Patient Name: Jane Davidson MRN: [id number] DOB:04-08-1941, 82 y.o., female Today's Date: 01/07/2023  END OF SESSION:  PT End of Session - 01/07/23 1348     Visit Number 7    Number of Visits 10    Date for PT Re-Evaluation 02/10/23    Authorization - Visit Number 7    Authorization - Number of Visits 10    Progress Note Due on Visit 10    PT Start Time [id number]    PT Stop Time 1426    PT Time Calculation (min) 38 min    Activity Tolerance Patient tolerated treatment well    Behavior During Therapy [facility] for tasks assessed/performed                   Past Medical History:  Diagnosis Date   Abnormal Pap smear of cervix 2001   Biopsy normal   Bursitis NEC    Cancer ([place])    skin   COPD (chronic obstructive pulmonary disease) ([place])    History of peripheral vascular disease 2001   s/p bilateral aorto-iliac-femoral bypass /[place]   Hyperlipidemia    hypothyroid    Osteopenia 2009    T scores - 1.5 DEXA 2009   Screening for breast cancer May 2012   [place] mammogram   Tobacco abuse disorder    Vitamin D deficiency April 2011   replaced withDrsido for level of 11.3 ng/ml   Vitamin D deficiency April 2011   replaced withDrsido for level of 11.3 ng/ml   Past Surgical History:  Procedure Laterality Date   BREAST BIOPSY Left    benign   CAROTID ENDARTERECTOMY Right 2016   Leotis Pain   CATARACT EXTRACTION  Sept 2011   [place]  Jun 2011   [place]    TUBAL LIGATION     Patient Active Problem List   Diagnosis Date Noted   External hemorrhoid 11/27/2022   Pain and swelling of right ankle 11/13/2022   Dermatitis 11/03/2022   Left sided sciatica 10/15/2022   Urinary urgency 04/08/2022   Major depressive disorder, single episode, in remission ([place]) 07/18/2021   Aortic atherosclerosis ([place]) Q000111Q   Gastritis, Helicobacter pylori Q000111Q   Acquired thrombophilia ([place])  04/15/2020   PAF (paroxysmal atrial fibrillation) ([place]) 04/07/2020   Degeneration of cervical intervertebral disc 05/28/2019   Type 2 diabetes mellitus with diabetic peripheral angiopathy without gangrene, without long-term current use of insulin ([place]) 05/28/2019   Chest pain at rest 08/15/2018   Hyponatremia 06/11/2018   Osteoarthritis of spine 03/01/2018   Chronic low back pain without sciatica 03/01/2018   Spinal stenosis of lumbosacral region 03/01/2018   Lumbar herniated disc 03/01/2018   Cardiac murmur 03/01/2018   Nausea in adult 03/01/2018   Dysuria 06/10/2017   Lumbar radiculopathy 06/04/2017   Atopic dermatitis 04/22/2017   Carotid stenosis 08/29/2016   Essential hypertension 02/29/2016   Chronic left hip pain 04/01/2015   Constipation 06/08/2014   Anemia 09/27/2013   Encounter for preventive health examination 03/27/2013   Screening for colon cancer 10/16/2011   Tobacco abuse counseling 08/09/2011   History of peripheral vascular disease    Osteoporosis of forearm    Tobacco abuse disorder    Vitamin D deficiency    Arrhythmia 07/05/2011   Hypothyroidism 07/05/2011   Varicose veins of left lower extremity with pain 07/05/2011   COPD (chronic obstructive pulmonary disease) ([place])    Hyperlipidemia  Screening for breast cancer 02/11/2011    PCP: Derrel Nip MD  REFERRING PROVIDER: Derrel Nip MD  REFERRING DIAG: R foot pain  Rationale for Evaluation and Treatment: Rehabilitation  THERAPY DIAG:  Pain in right foot  ONSET DATE: April 2023  SUBJECTIVE:                                                                                                                                                                                           SUBJECTIVE STATEMENT: Pt reports she has not spoke with PCP about swelling in R foot. 1/10 pain today. 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 Order sent to physical  therapy for L hip pain with LLE radicular symptoms. She reports she has not had that hip pain she was referred for "for a while", and that she has R foot pain that is more pertinent to current dysfunction. Current L hip 0/10; reports R foot pain is 7/10. No pain in L hip over the past several weeks. R foot pain is at medial arch. Patient reports increased R foot pain with standing, walking >67mins, and amplified by walking on uneven ground in her yard. Pain in R foot is alleviated by tylenol. She is having swelling in R foot that is more so when she is up and moving around. She lives alone with her dog, completes her own household and community errands. Finds it difficult to gather sticks in her yard for her brother to mow d/t foot pain. Reports no falls in the past 6 months. Pt denies N/V, B&B changes, unexplained weight fluctuation, saddle paresthesia, fever, night sweats, or unrelenting night pain at this time.  New order sent for R foot pain   PAIN:  Are you having pain? Yes: NPRS scale: 7/10 Pain location: medial arch Pain description: nagging; step wrong then sharp Aggravating factors: walking, standing, walking on uneven ground Relieving factors: tylenol  PRECAUTIONS: None  WEIGHT BEARING RESTRICTIONS: No  FALLS:  Has patient fallen in last 6 months? No  LIVING ENVIRONMENT: Lives with: lives alone Lives in: House/apartment Stairs: Yes: External: 3-4 steps; none Has following equipment at home: None grab bars in bathroom, shower seat  OCCUPATION: retired; enjoys reading  PLOF: Independent  PATIENT GOALS: decrease pain   NEXT MD VISIT: 12/03/22 [place] for DVT  OBJECTIVE:   DIAGNOSTIC FINDINGS:  Xray R ankle 11/16/22 unremarkable  PATIENT SURVEYS:  FOTO next visit  SCREENING FOR RED FLAGS: Bowel or bladder incontinence: No Spinal tumors: No Cauda equina syndrome: No Compression fracture: No Abdominal aneurysm: No  COGNITION: Overall  cognitive status: Within functional  limits for tasks assessed     SENSATION: WFL   EDEMA:  Visually present slightly R>L   POSTURE: bilat navicular drop R>L, midfoot pronation, bilat knee valgus, bilat hip IR  PALPATION: TTP at top of foot d/t swelling TTP with concordant pain to mdial arch foot intrinsics Minimal pain with palpable tension and trigger points to gastroc  LOWER EXTREMITY ROM:  Active ROM Right eval Left eval  Hip flexion    Hip extension 50% limited bilat  Hip abduction wnl wnl  Hip adduction    Hip internal rotation 25% limited bilat  Hip external rotation wnl wnl  Knee flexion wnl wnl  Knee extension wnl wnl  Ankle dorsiflexion 4 13  Ankle plantarflexion WNL WNL  Ankle eversion 22 30  Ankle inversion 28 49   (Blank rows = not tested) GT passive ROM L 80 R 77   LOWER EXTREMITY MMT:  MMT Right eval Left eval  Hip flexion 4+ 4+  Hip extension 3+ 3+  Hip abduction 4- 4-  Hip adduction    Hip internal rotation 5 (within available range) 5 (within available range)  Hip external rotation 4 4  Knee flexion 4+ 4+  Knee extension    Ankle dorsiflexion 3+ 4+  Ankle plantarflexion    Ankle inversion 5 5  Ankle eversion 5 5   (Blank rows = not tested)  SLE heel raises R: 23 L: 23  LOWER EXTREMITY SPECIAL TESTS:  Ankle special tests: Homan's test: negative Windlass +R foot  FUNCTIONAL TESTS:  10 meter walk test: self selected  0.73m/s; fastest 1.75m/s  GAIT: Distance walked: 33m Assistive device utilized: None Level of assistance: Complete Independence Comments: ankle eversion used through R foot swing for minimal foot clearance, decreased R stance with decreased L step length, decreased speed   TODAY'S TREATMENT:                                                                                                                              DATE: 11/25/22 Ther-Ex Nustep 10seat 14UE L3 48mins for gentle strengthening  Rocker board PF <> DF x15; side to side x15;  with max cuing  for technique without excessive hip movement with good carry over  Squat to chair touch with heel raise at stand 2x 10 with good carry over of demo   Lateral lunge onto foam pad 2x 10 each direction with cuing for full return step and sequencing with good carry over  Semi tandem ball toss/catch with PT throwing ball slightly outside BOS 2x 12 tosses each LE  Heel raise from step with eccentric lower x12  Standing gastroc stretch on step x45sec Ant tib stretch 45sec hold    PATIENT EDUCATION:  Education details: Patient was educated on diagnosis, anatomy and pathology involved, prognosis, role of PT, and was given an HEP, demonstrating exercise with proper form following verbal and tactile cues, and was given a paper  hand out to continue exercise at home. Pt was educated on and agreed to plan of care.  Person educated: Patient Education method: Explanation, Demonstration, and Verbal cues Education comprehension: verbalized understanding, returned demonstration, and verbal cues required  HOME EXERCISE PROGRAM: Access Code: [email or web address]  - Standing Gastroc Stretch  - 1-3 x daily - 7 x weekly - 30-60sec hold - Seated Self Great Toe Stretch  - 1-3 x daily - 7 x weekly - 30-60sec hold - Standing Single Leg Stance with Counter Support  - 1-2 x daily - 7 x weekly - 15sec hold  ASSESSMENT:  CLINICAL IMPRESSION: PT initiated therex progression for increased ankle mobility and strength, with focus on motor control of hip/knee/ankle with success. Pt is able to comply with all multimodal cuing for proper technique of therex with good motivation throughout session.. Would benefit from skilled PT to address above deficits and promote optimal return to PLOF.    OBJECTIVE IMPAIRMENTS: Abnormal gait, decreased activity tolerance, decreased balance, decreased endurance, decreased mobility, difficulty walking, decreased ROM, decreased strength, increased fascial restrictions, increased muscle spasms,  impaired flexibility, impaired tone, improper body mechanics, postural dysfunction, and pain.   ACTIVITY LIMITATIONS: carrying, lifting, standing, squatting, transfers, dressing, and locomotion level  PARTICIPATION LIMITATIONS: meal prep, cleaning, laundry, shopping, community activity, and yard work  PERSONAL FACTORS: Age, Fitness, Past/current experiences, Time since onset of injury/illness/exacerbation, and 3+ comorbidities: OA, chronic LBP, COPD, HTN, DM2  are also affecting patient's functional outcome.   REHAB POTENTIAL: Good  CLINICAL DECISION MAKING: Evolving/moderate complexity  EVALUATION COMPLEXITY: Moderate   GOALS: Goals reviewed with patient? Yes  SHORT TERM GOALS: Target date: 12/26/22 Pt will be independent with HEP in order to improve strength and balance in order to decrease fall risk and improve function at home and work.  Baseline: HEP given  Goal status: INITIAL    LONG TERM GOALS: Target date: 02/10/23  Patient will increase FOTO score to 79 to demonstrate predicted increase in functional mobility to complete ADLs Baseline: next visit Goal status: INITIAL  2.  Pt will decrease worst pain as reported on NPRS by at least 3 points in order to demonstrate clinically significant reduction in pain.  Baseline: 7/10 Goal status: INITIAL  3.  Pt will increase 10MWT to at least 1.2 m/s in order to demonstrate clinically significant independence in community ambulation.   Baseline:  Goal status: INITIAL  4.  Pt will demonstrate SLS of 18 sec or more bilat in order to demonstrate age matched normal static balance needed to reduce fall risk Baseline: 3sec bilat Goal status: INITIAL     PLAN:  PT FREQUENCY: 1-2x/week  PT DURATION: 8 weeks  PLANNED INTERVENTIONS: Therapeutic exercises, Therapeutic activity, Neuromuscular re-education, Balance training, Gait training, Patient/Family education, Self Care, Joint mobilization, Joint manipulation, Stair  training, Aquatic Therapy, Dry Needling, Electrical stimulation, Spinal manipulation, Spinal mobilization, Cryotherapy, Moist heat, Traction, Ultrasound, Manual therapy, and Re-evaluation  PLAN FOR NEXT SESSION: [place] DPT  Durwin Reges, PT 01/07/2023, 2:30 PM

## 2023-01-16 DIAGNOSIS — L858 Other specified epidermal thickening: Secondary | ICD-10-CM | POA: Diagnosis not present

## 2023-01-16 DIAGNOSIS — D485 Neoplasm of uncertain behavior of skin: Secondary | ICD-10-CM | POA: Diagnosis not present

## 2023-02-02 ENCOUNTER — Encounter: Payer: Self-pay | Admitting: Internal Medicine

## 2023-02-02 ENCOUNTER — Telehealth: Payer: Self-pay

## 2023-02-02 DIAGNOSIS — L821 Other seborrheic keratosis: Secondary | ICD-10-CM | POA: Diagnosis not present

## 2023-02-02 DIAGNOSIS — C44729 Squamous cell carcinoma of skin of left lower limb, including hip: Secondary | ICD-10-CM | POA: Diagnosis not present

## 2023-02-02 NOTE — Telephone Encounter (Signed)
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 They are needing to know what the pt's BUN and Creatinine are, both of these were done in February. Then the daughter stated that she also needed the creatine level.

## 2023-02-02 NOTE — Telephone Encounter (Signed)
Pt said she took a screen shot of pt's mychart and sent to the dermatology office, they stated that was all they needed.

## 2023-02-04 ENCOUNTER — Encounter: Payer: Self-pay | Admitting: Gastroenterology

## 2023-02-04 ENCOUNTER — Ambulatory Visit (INDEPENDENT_AMBULATORY_CARE_PROVIDER_SITE_OTHER): Payer: PPO | Admitting: Gastroenterology

## 2023-02-04 VITALS — BP 125/71 | HR 76 | Temp 98.3°F | Ht 64.0 in | Wt 131.4 lb

## 2023-02-04 DIAGNOSIS — K625 Hemorrhage of anus and rectum: Secondary | ICD-10-CM

## 2023-02-04 NOTE — Progress Notes (Signed)
Patient stated that she will be calling to let us know when in June she will schedule her colonoscopy. Instructions were given and explained.

## 2023-02-04 NOTE — Progress Notes (Signed)
[name] MD, MRCP(U.K) [address]  [address]  [place], [place] [address]  Main: [phone]  Fax: [phone]   Gastroenterology Consultation  Referring Provider:     Sherlene Shams, MD Primary Care Physician:  Sherlene Shams, MD Primary Gastroenterologist:  Dr. [name]  Reason for Consultation:    External hemorrhoids        HPI:   Jane Davidson is a 82 y.o. y/o female referred for external hemorrhoids.   She was seen recently by her primary care provider for rectal bleeding associated with pain during defecation the patient is on Eliquis.  Referred to GI.  Last colonoscopy was in 2011 and documented that she had polyps.  No report available.  11/13/2022 hemoglobin 11.4 MCV 91. She is on Eliquis.  She says she has been having rectal bleeding for more than a few weeks.  Describes it as dark blood mixed with the stool sometimes in the toilet bowl as well as on the tissue paper.  Complains of passage of hard stools.  Some change in the shape of her stool.  Possibly some weight loss.  No other complaint.  Past Medical History:  Diagnosis Date   Abnormal Pap smear of cervix 2001   Biopsy normal   Bursitis NEC    Cancer    skin   COPD (chronic obstructive pulmonary disease)    History of peripheral vascular disease 2001   s/p bilateral aorto-iliac-femoral bypass /[facility]   Hyperlipidemia    hypothyroid    Osteopenia 2009    T scores - 1.5 DEXA 2009   Screening for breast cancer May 2012   [facility]   Tobacco abuse disorder    Vitamin D deficiency April 2011   replaced withDrsido for level of 11.3 ng/ml   Vitamin D deficiency April 2011   replaced withDrsido for level of 11.3 ng/ml    Past Surgical History:  Procedure Laterality Date   BREAST BIOPSY Left    benign   CAROTID ENDARTERECTOMY Right 2016   [facility]   CATARACT EXTRACTION  Sept 2011   CHOLECYSTECTOMY     HERNIA REPAIR  Jun 2011   [facility]    TUBAL LIGATION      Prior to  Admission medications   Medication Sig Start Date End Date Taking? Authorizing Provider  amLODipine (NORVASC) 10 MG tablet Take 1 tablet (10 mg total) by mouth daily. 11/03/22   Sherlene Shams, MD  apixaban (ELIQUIS) 5 MG TABS tablet TAKE 1 TABLET(5 MG) BY MOUTH TWICE DAILY 12/08/22   Sherlene Shams, MD  atorvastatin (LIPITOR) 20 MG tablet Take 1 tablet (20 mg total) by mouth daily. 02/24/22   Sherlene Shams, MD  Calcium Carb-Cholecalciferol (CALCIUM 600 + D PO) Take 1 tablet by mouth daily. Calcium 600 mg and Vitamin D 800 IUs    [provider]  clobetasol ointment (TEMOVATE) 0.05 % Apply 1 application topically 2 (two) times daily. 05/23/16   Sherlene Shams, MD  fluticasone (FLONASE) 50 MCG/ACT nasal spray SHAKE LIQUID AND USE 2 SPRAYS IN EACH NOSTRIL DAILY 01/05/23   Sherlene Shams, MD  fluticasone-salmeterol (WIXELA INHUB) 100-50 MCG/ACT AEPB Inhale 1 puff into the lungs 2 (two) times daily. 11/06/21   Sherlene Shams, MD  levothyroxine (SYNTHROID) 88 MCG tablet TAKE 1 TABLET(88 MCG) BY MOUTH DAILY 06/22/22   Worthy Rancher B, FNP  loratadine (CLARITIN) 10 MG tablet Take 1 tablet (10 mg total) by mouth  daily. 09/25/17   Eustaquio Boyden, MD  losartan (COZAAR) 100 MG tablet Take 1 tablet (100 mg total) by mouth daily. 11/03/22   Sherlene Shams, MD  metoprolol tartrate (LOPRESSOR) 25 MG tablet Take 1 tablet (25 mg total) by mouth 2 (two) times daily. 12/08/22 06/06/23  Sherlene Shams, MD  nitroGLYCERIN (NITROSTAT) 0.4 MG SL tablet Place 1 tablet (0.4 mg total) under the tongue every 5 (five) minutes as needed for chest pain. 10/15/22   Sherlene Shams, MD  sertraline (ZOLOFT) 50 MG tablet TAKE 1 TABLET(50 MG) BY MOUTH DAILY 10/21/22   Sherlene Shams, MD  traMADol (ULTRAM) 50 MG tablet Take 1 tablet (50 mg total) by mouth every 12 (twelve) hours as needed for moderate pain. 11/04/22   Sherlene Shams, MD  triamcinolone cream (KENALOG) 0.1 % APPLY TOPICALLY TO THE AFFECTED AREA TWICE DAILY 12/15/22    Kara Dies, NP    Family History  Problem Relation Age of Onset   Hypertension Mother    Arthritis Mother    Diabetes Father    Dementia Sister    Breast cancer Neg Hx      Social History   Tobacco Use   Smoking status: Every Day    Packs/day: 1.04    Years: 40.00    Additional pack years: 0.00    Total pack years: 41.60    Types: Cigarettes   Smokeless tobacco: Never  Vaping Use   Vaping Use: Never used  Substance Use Topics   Alcohol use: No   Drug use: No    Allergies as of 02/04/2023 - Review Complete 02/04/2023  Allergen Reaction Noted   Bee pollen Other (See Comments) 02/25/2022   Pollen extract     Hctz [hydrochlorothiazide]  08/13/2018   Other Cough 03/30/2018    Review of Systems:    All systems reviewed and negative except where noted in HPI.   Physical Exam:  BP 125/71   Pulse 76   Temp 98.3 F (36.8 C) (Oral)   Ht  (1.626 m)   Wt 131 lb 6.4 oz (59.6 kg)   BMI 22.55 kg/m  No LMP recorded. Patient is postmenopausal. Psych:  Alert and cooperative. Normal mood and affect. General:   Alert,  Well-developed, well-nourished, pleasant and cooperative in NAD Head:  Normocephalic and atraumatic. Eyes:  Sclera clear, no icterus.   Conjunctiva pink. Ears:  Normal auditory acuity. Neurologic:  Alert and oriented x3;  grossly normal neurologically. Psych:  Alert and cooperative. Normal mood and affect.  Imaging Studies: No results found.  Assessment and Plan:   Jane Davidson is a 82 y.o. y/o female has been referred for possible hemorrhoids and rectal bleeding.  Some weight loss and change in the shape of her stool with blood mixed with the stool warrants further evaluation discussed options with her.  Plan 1.  Colonoscopy with Eliquis holding instructions 2.  Discussed about commencing on MiraLAX to treat her constipation which may be the underlying cause of her bleeding.  Discussed about conservative management of internal  hemorrhoids.  I will bring her back to my office in 1 to 3 weeks after her colonoscopy if bleeding from hemorrhoids is noted and persists then we will proceed with hemorrhoidal banding.   I have discussed alternative options, risks & benefits,  which include, but are not limited to, bleeding, infection, perforation,respiratory complication & drug reaction.  The patient agrees with this plan & written consent will be obtained.  Follow up in 1 to 3 weeks after colonoscopy  Dr [name] MD,MRCP(U.K)

## 2023-02-04 NOTE — Patient Instructions (Addendum)
How to Take a [employer] A sitz bath is a warm water bath that may be used to care for your rectum, genital area, or the area between your rectum and genitals (perineum). In a sitz bath, the water only comes up to your hips and covers your buttocks. A sitz bath may be done in a bathtub or with a portable sitz bath that fits over the toilet. Your health care provider may recommend a sitz bath to help: Relieve pain and discomfort after delivering a baby. Relieve pain and itching from hemorrhoids or anal fissures. Relieve pain after certain surgeries. Relax muscles that are sore or tight. How to take a sitz bath Take 2-4 sitz baths a day, or as many as told by your health care provider. Bathtub sitz bath To take a sitz bath in a bathtub: Partially fill a bathtub with warm water. The water should be deep enough to cover your hips and buttocks when you are sitting in the bathtub. Follow your health care provider's instructions if you are told to put medicine in the water. Sit in the water. Open the bathtub drain a little, and leave it open during your bath. Turn on the warm water again, enough to replace the water that is draining out. Keep the water running throughout your bath. This helps keep the water at the right level and temperature. Soak in the water for 15-20 minutes, or as long as told by your health care provider. When you are done, be careful when you stand up. You may feel dizzy. After the sitz bath, pat yourself dry. Do not rub your skin to dry it.  Over-the-toilet sitz bath To take a sitz bath with an over-the-toilet basin: Follow the manufacturer's instructions. Fill the basin with warm water. Follow your health care provider's instructions if you were told to put medicine in the water. Sit on the seat. Make sure the water covers your buttocks and perineum. Soak in the water for 15-20 minutes, or as long as told by your health care provider. After the sitz bath, pat yourself  dry. Do not rub your skin to dry it. Clean and dry the basin between uses. Discard the basin if it cracks, or according to the manufacturer's instructions.  Contact a health care provider if: Your pain or itching gets worse. Stop doing sitz baths if your symptoms get worse. You have new symptoms. Stop doing sitz baths until you talk with your health care provider. Summary A sitz bath is a warm water bath in which the water only comes up to your hips and covers your buttocks. Your health care provider may recommend a sitz bath to help relieve pain and discomfort after delivering a baby, relieve pain and itching from hemorrhoids or anal fissures, relieve pain after certain surgeries, or help to relax muscles that are sore or tight. Take 2-4 sitz baths a day, or as many as told by your health care provider. Soak in the water for 15-20 minutes. Stop doing sitz baths if your symptoms get worse. This information is not intended to replace advice given to you by your health care provider. Make sure you discuss any questions you have with your health care provider. Document Revised: 12/31/2021 Document Reviewed: 12/31/2021 Elsevier Patient Education  2023 Else[employer] Inc[employer]   Nonsurgical Procedures for Hemorrhoids Nonsurgical procedures can be used to treat hemorrhoids. Hemorrhoids are swollen veins in and around the rectum or the opening of the butt (anus). There are two types: Internal. These  occur in the veins just inside the rectum. They may poke through to the outside. External. These occur in the veins outside the anus. They can be felt as a swelling or hard lump near the anus. Hemorrhoids can cause bleeding and pain. They can often be treated at home. If home treatments do not help, you may need to have a procedure done. Nonsurgical procedures include rubber band ligation, sclerotherapy, and infrared coagulation. Tell a health care provider about: Any allergies you have. All medicines you are  taking, including vitamins, herbs, eye drops, creams, and over-the-counter medicines. Any problems you or family members have had with anesthesia. Any bleeding problems you have. Any surgeries you have had. Any medical conditions you have. Whether you are pregnant or may be pregnant. What are the risks? Your health care provider will talk with you about risks. These may include: Infection. Bleeding. Pain. What happens before the procedure? Medicines Ask your provider about: Changing or stopping your regular medicines. These include any diabetes medicines or blood thinners you take. Taking medicines such as aspirin and ibuprofen. These medicines can thin your blood. Do not take them unless your provider tells you to. Taking over-the-counter medicines, vitamins, herbs, and supplements. General instructions Follow instructions from your provider about what you may eat and drink. You may need to have a colonoscopy. This is when your provider looks inside your colon using a tube with a camera on the end. This may be done to make sure there are no other causes for your bleeding or pain. What happens during the procedure?  Your rectal area will be cleaned. A jelly may be put into your rectum. It may contain a medicine to numb the area and keep you from feeling pain (anesthesia). A short tube (anoscope) will be put into your rectum to look at the hemorrhoids. If you have rubber band ligation done: Tools will be used to put rubber bands around the base of the hemorrhoids. This will cut off their blood supply. The hemorrhoids will fall off after a few days. If you have sclerotherapy done: Medicine will be injected into the hemorrhoids. This will cause the hemorrhoids to shrink and dry up. If you have infrared coagulation done: A type of light will be shined on the hemorrhoids. The light will make energy (infrared radiation). This will cause the hemorrhoids to scar and then fall off. Each of  these procedures may vary among providers and hospitals. What happens after the procedure? You will be watched to make sure that you have no bleeding. Return to your normal activities as told by your provider. Ask your provider what activities are safe for you. This information is not intended to replace advice given to you by your health care provider. Make sure you discuss any questions you have with your health care provider. Document Revised: 05/23/2022 Document Reviewed: 05/23/2022 Elsevier Patient Education  2023 [employer].

## 2023-02-06 DIAGNOSIS — C44729 Squamous cell carcinoma of skin of left lower limb, including hip: Secondary | ICD-10-CM | POA: Diagnosis not present

## 2023-02-06 DIAGNOSIS — L298 Other pruritus: Secondary | ICD-10-CM | POA: Diagnosis not present

## 2023-02-09 ENCOUNTER — Telehealth: Payer: Self-pay | Admitting: Gastroenterology

## 2023-02-09 NOTE — Telephone Encounter (Signed)
Pt left message to schedule colonoscopy please return call  

## 2023-02-17 ENCOUNTER — Encounter (INDEPENDENT_AMBULATORY_CARE_PROVIDER_SITE_OTHER): Payer: PPO

## 2023-02-17 ENCOUNTER — Ambulatory Visit (INDEPENDENT_AMBULATORY_CARE_PROVIDER_SITE_OTHER): Payer: PPO | Admitting: Vascular Surgery

## 2023-02-19 ENCOUNTER — Ambulatory Visit: Payer: PPO | Admitting: Gastroenterology

## 2023-02-26 ENCOUNTER — Telehealth: Payer: Self-pay

## 2023-02-26 DIAGNOSIS — I10 Essential (primary) hypertension: Secondary | ICD-10-CM | POA: Diagnosis not present

## 2023-02-26 DIAGNOSIS — I480 Paroxysmal atrial fibrillation: Secondary | ICD-10-CM | POA: Diagnosis not present

## 2023-02-26 DIAGNOSIS — Z720 Tobacco use: Secondary | ICD-10-CM | POA: Diagnosis not present

## 2023-02-26 DIAGNOSIS — J449 Chronic obstructive pulmonary disease, unspecified: Secondary | ICD-10-CM | POA: Diagnosis not present

## 2023-02-26 DIAGNOSIS — E785 Hyperlipidemia, unspecified: Secondary | ICD-10-CM | POA: Diagnosis not present

## 2023-02-26 DIAGNOSIS — E1151 Type 2 diabetes mellitus with diabetic peripheral angiopathy without gangrene: Secondary | ICD-10-CM | POA: Diagnosis not present

## 2023-02-26 NOTE — Telephone Encounter (Signed)
Called patient to let her know that we finally received Dr. Melina Davidson Eliquis hold form and that I was calling her to ask if she was ready to schedule her colonoscopy as recommended by Dr. Tobi Davidson. Patient stated that she was no longer having rectal bleeding, therefore, she did not feel the need to schedule a colonoscopy. I told her that if she experienced rectal bleeding, to please call us back so we could schedule the colooscopy. I also let her know that I would notify Dr. Tobi Davidson. Patient was fine with that and had no further questions. Per Dr. Darrick Davidson, patient is to hold her Eliquis 3 days before her procedure and restart 2 days after procedure.

## 2023-02-27 NOTE — Telephone Encounter (Signed)
Ok thanks.   FYI Dr Darrick Huntsman

## 2023-03-11 ENCOUNTER — Other Ambulatory Visit: Payer: Self-pay | Admitting: Internal Medicine

## 2023-03-11 DIAGNOSIS — E7800 Pure hypercholesterolemia, unspecified: Secondary | ICD-10-CM

## 2023-03-13 DIAGNOSIS — C44729 Squamous cell carcinoma of skin of left lower limb, including hip: Secondary | ICD-10-CM | POA: Diagnosis not present

## 2023-03-18 ENCOUNTER — Telehealth: Payer: Self-pay

## 2023-03-18 MED ORDER — DOXYCYCLINE HYCLATE 100 MG PO TABS: 100.0000 mg | ORAL_TABLET | Freq: Two times a day (BID) | ORAL | 0 refills | Status: DC

## 2023-03-18 NOTE — Telephone Encounter (Signed)
Spoke with patient & the tick was stuck pretty good (jugular area)-Pt states that she has had some head & throat congestion. Denies fever. Area is just a little red, no swelling or itching in the area.   Pt not allergic to any antibiotics & uses [facility] on [address]

## 2023-03-18 NOTE — Telephone Encounter (Signed)
Patient states she found a tick that had a white dot on it's back, on her neck just a while ago and she removed it with tweezers.  Patient states she would like to know if Dr. Duncan Dull thinks she needs doxycycline.

## 2023-03-18 NOTE — Telephone Encounter (Signed)
Not every tick bite needs to be treated.  I will send the doxycycline,  but she can hold off on starting it unless she develops  1) fever and headache  2) enlarging red circle around the area of tick bite/

## 2023-03-19 NOTE — Telephone Encounter (Signed)
Pt notified of message & does not have any symptoms. Will hold medication (although she started on it yesterday)

## 2023-04-10 DIAGNOSIS — D485 Neoplasm of uncertain behavior of skin: Secondary | ICD-10-CM | POA: Diagnosis not present

## 2023-04-10 DIAGNOSIS — C44729 Squamous cell carcinoma of skin of left lower limb, including hip: Secondary | ICD-10-CM | POA: Diagnosis not present

## 2023-05-02 ENCOUNTER — Other Ambulatory Visit: Payer: Self-pay | Admitting: Internal Medicine

## 2023-05-04 ENCOUNTER — Ambulatory Visit: Payer: PPO | Admitting: Internal Medicine

## 2023-05-04 ENCOUNTER — Encounter: Payer: Self-pay | Admitting: Internal Medicine

## 2023-05-04 VITALS — BP 130/60 | HR 73 | Ht 64.0 in | Wt 128.0 lb

## 2023-05-04 DIAGNOSIS — E871 Hypo-osmolality and hyponatremia: Secondary | ICD-10-CM | POA: Diagnosis not present

## 2023-05-04 DIAGNOSIS — E1151 Type 2 diabetes mellitus with diabetic peripheral angiopathy without gangrene: Secondary | ICD-10-CM

## 2023-05-04 DIAGNOSIS — E034 Atrophy of thyroid (acquired): Secondary | ICD-10-CM

## 2023-05-04 DIAGNOSIS — I10 Essential (primary) hypertension: Secondary | ICD-10-CM | POA: Diagnosis not present

## 2023-05-04 DIAGNOSIS — K625 Hemorrhage of anus and rectum: Secondary | ICD-10-CM

## 2023-05-04 DIAGNOSIS — J449 Chronic obstructive pulmonary disease, unspecified: Secondary | ICD-10-CM

## 2023-05-04 DIAGNOSIS — R079 Chest pain, unspecified: Secondary | ICD-10-CM

## 2023-05-04 DIAGNOSIS — M545 Low back pain, unspecified: Secondary | ICD-10-CM | POA: Diagnosis not present

## 2023-05-04 DIAGNOSIS — E7800 Pure hypercholesterolemia, unspecified: Secondary | ICD-10-CM

## 2023-05-04 DIAGNOSIS — F325 Major depressive disorder, single episode, in full remission: Secondary | ICD-10-CM

## 2023-05-04 DIAGNOSIS — G8929 Other chronic pain: Secondary | ICD-10-CM

## 2023-05-04 DIAGNOSIS — Z716 Tobacco abuse counseling: Secondary | ICD-10-CM | POA: Diagnosis not present

## 2023-05-04 DIAGNOSIS — D649 Anemia, unspecified: Secondary | ICD-10-CM

## 2023-05-04 DIAGNOSIS — I480 Paroxysmal atrial fibrillation: Secondary | ICD-10-CM

## 2023-05-04 MED ORDER — LEVOTHYROXINE SODIUM 88 MCG PO TABS: 88.0000 ug | ORAL_TABLET | Freq: Every day | ORAL | 3 refills | Status: DC

## 2023-05-04 MED ORDER — LOSARTAN POTASSIUM 100 MG PO TABS: 100.0000 mg | ORAL_TABLET | Freq: Every day | ORAL | 1 refills | Status: DC

## 2023-05-04 MED ORDER — METOPROLOL TARTRATE 25 MG PO TABS: 25.0000 mg | ORAL_TABLET | Freq: Two times a day (BID) | ORAL | 5 refills | Status: DC

## 2023-05-04 MED ORDER — SERTRALINE HCL 50 MG PO TABS: ORAL_TABLET | ORAL | 1 refills | Status: DC

## 2023-05-04 MED ORDER — FLUTICASONE-SALMETEROL 100-50 MCG/ACT IN AEPB
1.0000 | INHALATION_SPRAY | Freq: Two times a day (BID) | RESPIRATORY_TRACT | 1 refills | Status: DC
Start: 2023-05-04 — End: 2023-11-09

## 2023-05-04 MED ORDER — APIXABAN 5 MG PO TABS: ORAL_TABLET | ORAL | 5 refills | Status: DC

## 2023-05-04 MED ORDER — AMLODIPINE BESYLATE 10 MG PO TABS: 10.0000 mg | ORAL_TABLET | Freq: Every day | ORAL | 1 refills | Status: DC

## 2023-05-04 NOTE — Patient Instructions (Addendum)
When your heart beats too fast for over 15 minutes,   you should take an EXTRA 1/2 tablet of metoprolol to slow it down,  NOT THE NITROGLYCERIN.  NITROGLYCERIN IS FOR CHEST PAIN.   IT DOES NOT SLOW DOWN YOUR HEART, BUT IT WILL DROP YOUR BLOOD PRESSURE SLIGHTLY AND GIVE YOU A HEADACHE.   THE ELIQUIS IS YOUR BLOOD THINNER.  THIS IS TO PREVENT STROKE   IF YOU ARE STILL HAVING RECTAL BLEEDING,  YOU SHOULD RESCHEDULE THE COLONOSCOPY

## 2023-05-04 NOTE — Progress Notes (Unsigned)
Subjective:  Patient ID: Jane Davidson, female    DOB: Jul 26, 1941  Age: 82 y.o. MRN: [id number]  CC: The primary encounter diagnosis was Type 2 diabetes mellitus with diabetic peripheral angiopathy without gangrene, without long-term current use of insulin (HCC). Diagnoses of Essential hypertension, Hypothyroidism due to acquired atrophy of thyroid, Pure hypercholesterolemia, and Chronic obstructive pulmonary disease, unspecified COPD type (HCC) were also pertinent to this visit.   HPI Jane Davidson presents for  Chief Complaint  Patient presents with   Medical Management of Chronic Issues    6 month follow up    1) excessive bruising on arms.  No gums bleeding .  Takes eliquis .  Still  having rectal bleeding.  Colonoscopy was cancelled per review of notes in chart .  Patient states  she will go forward with colonoscopy .   2) HTN:  tolerating 3 drug regimen without orthostasis   3 ) PAF: has had episodes of tachycardia without chest pain ,  has taken NTG.  !!  Does not understand what her mes are for  4 Tobacco abuse:  "it's os hard to quit , likes  to smoke after she eats.  Has cut  down to 1/2 pack daily . Tried the nicotine.  Has not tried chantix , wants to think about   5) chronic pain l Minimal use of tramadol , not daily   6)    Outpatient Medications Prior to Visit  Medication Sig Dispense Refill   atorvastatin (LIPITOR) 20 MG tablet TAKE 1 TABLET(20 MG) BY MOUTH DAILY 90 tablet 3   Calcium Carb-Cholecalciferol (CALCIUM 600 + D PO) Take 1 tablet by mouth daily. Calcium 600 mg and Vitamin D 800 IUs     clobetasol ointment (TEMOVATE) 0.05 % Apply 1 application topically 2 (two) times daily. 30 g 5   fluticasone (FLONASE) 50 MCG/ACT nasal spray SHAKE LIQUID AND USE 2 SPRAYS IN EACH NOSTRIL DAILY 48 g 0   loratadine (CLARITIN) 10 MG tablet Take 1 tablet (10 mg total) by mouth daily. 30 tablet 11   nitroGLYCERIN (NITROSTAT) 0.4 MG SL tablet Place 1 tablet (0.4 mg total)  under the tongue every 5 (five) minutes as needed for chest pain. 50 tablet 3   traMADol (ULTRAM) 50 MG tablet Take 1 tablet (50 mg total) by mouth every 12 (twelve) hours as needed for moderate pain. 60 tablet 2   triamcinolone cream (KENALOG) 0.1 % APPLY TOPICALLY TO THE AFFECTED AREA TWICE DAILY 30 g 0   amLODipine (NORVASC) 10 MG tablet Take 1 tablet (10 mg total) by mouth daily. 90 tablet 1   apixaban (ELIQUIS) 5 MG TABS tablet TAKE 1 TABLET(5 MG) BY MOUTH TWICE DAILY 60 tablet 5   fluticasone-salmeterol (WIXELA INHUB) 100-50 MCG/ACT AEPB Inhale 1 puff into the lungs 2 (two) times daily. 180 each 1   levothyroxine (SYNTHROID) 88 MCG tablet TAKE 1 TABLET(88 MCG) BY MOUTH DAILY 90 tablet 3   losartan (COZAAR) 100 MG tablet Take 1 tablet (100 mg total) by mouth daily. 90 tablet 1   metoprolol tartrate (LOPRESSOR) 25 MG tablet Take 1 tablet (25 mg total) by mouth 2 (two) times daily. 60 tablet 5   sertraline (ZOLOFT) 50 MG tablet TAKE 1 TABLET(50 MG) BY MOUTH DAILY 90 tablet 1   doxycycline (VIBRA-TABS) 100 MG tablet Take 1 tablet (100 mg total) by mouth 2 (two) times daily. (Patient not taking: Reported on 05/04/2023) 14 tablet 0   No facility-administered medications prior  to visit.    Review of Systems;  Patient denies headache, fevers, malaise, unintentional weight loss, skin rash, eye pain, sinus congestion and sinus pain, sore throat, dysphagia,  hemoptysis , cough, dyspnea, wheezing, chest pain, palpitations, orthopnea, edema, abdominal pain, nausea, melena, diarrhea, constipation, flank pain, dysuria, hematuria, urinary  Frequency, nocturia, numbness, tingling, seizures,  Focal weakness, Loss of consciousness,  Tremor, insomnia, depression, anxiety, and suicidal ideation.      Objective:  BP 130/60   Pulse 73   Ht 5\' 4"  (1.626 m)   Wt 128 lb (58.1 kg)   SpO2 98%   BMI 21.97 kg/m   BP Readings from Last 3 Encounters:  05/04/23 130/60  02/04/23 125/71  12/08/22 (!) 128/56     Wt Readings from Last 3 Encounters:  05/04/23 128 lb (58.1 kg)  02/04/23 131 lb 6.4 oz (59.6 kg)  12/08/22 135 lb 3.2 oz (61.3 kg)    Physical Exam  Lab Results  Component Value Date   HGBA1C 6.0 10/15/2022   HGBA1C 5.8 05/07/2022   HGBA1C 5.9 09/18/2021    Lab Results  Component Value Date   CREATININE 0.81 11/13/2022   CREATININE 0.76 10/15/2022   CREATININE 0.85 05/07/2022    Lab Results  Component Value Date   WBC 7.5 11/13/2022   HGB 11.4 (L) 11/13/2022   HCT 32.4 (L) 11/13/2022   PLT 266.0 11/13/2022   GLUCOSE 106 (H) 11/13/2022   CHOL 115 10/15/2022   TRIG 70.0 10/15/2022   HDL 53.60 10/15/2022   LDLDIRECT 49.0 10/15/2022   LDLCALC 48 10/15/2022   ALT 12 11/13/2022   AST 17 11/13/2022   NA 134 (L) 11/13/2022   K 4.2 11/13/2022   CL 101 11/13/2022   CREATININE 0.81 11/13/2022   BUN 18 11/13/2022   CO2 26 11/13/2022   TSH 1.64 09/18/2021   INR 1.0 04/08/2020   HGBA1C 6.0 10/15/2022   MICROALBUR 1.7 10/15/2022    US Venous Img Lower Unilateral Right (DVT)  Result Date: 12/03/2022 CLINICAL DATA:  Right lower extremity pain and edema. History of smoking. Patient is on anticoagulation. Evaluate for DVT. EXAM: RIGHT LOWER EXTREMITY VENOUS DOPPLER ULTRASOUND TECHNIQUE: Gray-scale sonography with graded compression, as well as color Doppler and duplex ultrasound were performed to evaluate the lower extremity deep venous systems from the level of the common femoral vein and including the common femoral, femoral, profunda femoral, popliteal and calf veins including the posterior tibial, peroneal and gastrocnemius veins when visible. The superficial great saphenous vein was also interrogated. Spectral Doppler was utilized to evaluate flow at rest and with distal augmentation maneuvers in the common femoral, femoral and popliteal veins. COMPARISON:  Right lower extremity venous Doppler ultrasound-01/13/2008 (negative). FINDINGS: Contralateral Common Femoral Vein:  Respiratory phasicity is normal and symmetric with the symptomatic side. No evidence of thrombus. Normal compressibility. Common Femoral Vein: No evidence of thrombus. Normal compressibility, respiratory phasicity and response to augmentation. Saphenofemoral Junction: No evidence of thrombus. Normal compressibility and flow on color Doppler imaging. Profunda Femoral Vein: No evidence of thrombus. Normal compressibility and flow on color Doppler imaging. Femoral Vein: No evidence of thrombus. Normal compressibility, respiratory phasicity and response to augmentation. Popliteal Vein: No evidence of thrombus. Normal compressibility, respiratory phasicity and response to augmentation. Calf Veins: No evidence of thrombus. Normal compressibility and flow on color Doppler imaging. Superficial Great Saphenous Vein: No evidence of thrombus. Normal compressibility. Other Findings:  None. IMPRESSION: No evidence of DVT within the right lower extremity. Electronically Signed  By: Simonne Come M.D.   On: 12/03/2022 14:59    Assessment & Plan:  .Type 2 diabetes mellitus with diabetic peripheral angiopathy without gangrene, without long-term current use of insulin (HCC) -     Hemoglobin A1c -     Comprehensive metabolic panel  Essential hypertension -     Comprehensive metabolic panel  Hypothyroidism due to acquired atrophy of thyroid -     TSH  Pure hypercholesterolemia -     Lipid panel -     LDL cholesterol, direct  Chronic obstructive pulmonary disease, unspecified COPD type (HCC) -     Fluticasone-Salmeterol; Inhale 1 puff into the lungs 2 (two) times daily.  Dispense: 180 each; Refill: 1  Other orders -     amLODIPine Besylate; Take 1 tablet (10 mg total) by mouth daily.  Dispense: 90 tablet; Refill: 1 -     Apixaban; TAKE 1 TABLET(5 MG) BY MOUTH TWICE DAILY  Dispense: 60 tablet; Refill: 5 -     Levothyroxine Sodium; Take 1 tablet (88 mcg total) by mouth daily before breakfast.  Dispense: 90 tablet;  Refill: 3 -     Losartan Potassium; Take 1 tablet (100 mg total) by mouth daily.  Dispense: 90 tablet; Refill: 1 -     Metoprolol Tartrate; Take 1 tablet (25 mg total) by mouth 2 (two) times daily.  Dispense: 60 tablet; Refill: 5 -     Sertraline HCl; TAKE 1 TABLET(50 MG) BY MOUTH DAILY  Dispense: 90 tablet; Refill: 1     I provided 30 minutes of face-to-face time during this encounter reviewing patient's last visit with me, patient's  most recent visit with cardiology,  nephrology,  and neurology,  recent surgical and non surgical procedures, previous  labs and imaging studies, counseling on currently addressed issues,  and post visit ordering to diagnostics and therapeutics .   Follow-up: No follow-ups on file.   Sherlene Shams, MD

## 2023-05-05 ENCOUNTER — Encounter: Payer: Self-pay | Admitting: Internal Medicine

## 2023-05-05 ENCOUNTER — Telehealth: Payer: Self-pay

## 2023-05-05 DIAGNOSIS — K625 Hemorrhage of anus and rectum: Secondary | ICD-10-CM | POA: Insufficient documentation

## 2023-05-05 LAB — TSH: TSH: 3.69 u[IU]/mL (ref 0.35–5.50)

## 2023-05-05 LAB — CBC WITH DIFFERENTIAL/PLATELET
Basophils Absolute: 0 10*3/uL (ref 0.0–0.1)
Basophils Relative: 0.4 % (ref 0.0–3.0)
Eosinophils Absolute: 0 10*3/uL (ref 0.0–0.7)
Eosinophils Relative: 0.4 % (ref 0.0–5.0)
HCT: 35.2 % — ABNORMAL LOW (ref 36.0–46.0)
Hemoglobin: 11.6 g/dL — ABNORMAL LOW (ref 12.0–15.0)
Lymphocytes Relative: 9.2 % — ABNORMAL LOW (ref 12.0–46.0)
Lymphs Abs: 0.7 10*3/uL (ref 0.7–4.0)
MCHC: 33.1 g/dL (ref 30.0–36.0)
MCV: 93.4 fl (ref 78.0–100.0)
Monocytes Absolute: 0.6 10*3/uL (ref 0.1–1.0)
Monocytes Relative: 8.2 % (ref 3.0–12.0)
Neutro Abs: 6.1 10*3/uL (ref 1.4–7.7)
Neutrophils Relative %: 81.8 % — ABNORMAL HIGH (ref 43.0–77.0)
Platelets: 263 10*3/uL (ref 150.0–400.0)
RBC: 3.77 Mil/uL — ABNORMAL LOW (ref 3.87–5.11)
RDW: 14.2 % (ref 11.5–15.5)
WBC: 7.5 10*3/uL (ref 4.0–10.5)

## 2023-05-05 LAB — LIPID PANEL
Cholesterol: 129 mg/dL (ref 0–200)
HDL: 58.1 mg/dL (ref >39.00)
LDL Cholesterol: 47 mg/dL (ref 0–99)
NonHDL: 71.25
Total CHOL/HDL Ratio: 2
Triglycerides: 122 mg/dL (ref 0.0–149.0)
VLDL: 24.4 mg/dL (ref 0.0–40.0)

## 2023-05-05 LAB — LDL CHOLESTEROL, DIRECT: Direct LDL: 54 mg/dL

## 2023-05-05 LAB — COMPREHENSIVE METABOLIC PANEL
ALT: 10 U/L (ref 0–35)
AST: 16 U/L (ref 0–37)
Albumin: 3.9 g/dL (ref 3.5–5.2)
Alkaline Phosphatase: 47 U/L (ref 39–117)
BUN: 22 mg/dL (ref 6–23)
CO2: 27 mEq/L (ref 19–32)
Calcium: 8.7 mg/dL (ref 8.4–10.5)
Chloride: 98 mEq/L (ref 96–112)
Creatinine, Ser: 0.86 mg/dL (ref 0.40–1.20)
GFR: 63.73 mL/min (ref >60.00)
Glucose, Bld: 86 mg/dL (ref 70–99)
Potassium: 4.1 mEq/L (ref 3.5–5.1)
Sodium: 131 mEq/L — ABNORMAL LOW (ref 135–145)
Total Bilirubin: 0.3 mg/dL (ref 0.2–1.2)
Total Protein: 6.4 g/dL (ref 6.0–8.3)

## 2023-05-05 LAB — HEMOGLOBIN A1C: Hgb A1c MFr Bld: 6 % (ref 4.6–6.5)

## 2023-05-05 NOTE — Assessment & Plan Note (Signed)
encouraged her to reduce daily use by 1 cig per week .  She has reduced use to 1/2 pack daily but has been unable to reduce further on her own . She declines use of  pharmacotherapy

## 2023-05-05 NOTE — Assessment & Plan Note (Signed)
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   Improved with  d/c hctz  Lab Results  Component Value Date   NA 134 (L) 11/13/2022   K 4.2 11/13/2022   CL 101 11/13/2022   CO2 26 11/13/2022

## 2023-05-05 NOTE — Telephone Encounter (Signed)
Per Dr. Darrick Huntsman: seeing patient for follow up. she states that she is still having rectal bleeding and would like to schedule the colonoscopy \.  I then called patient's cell and home phone and she did not answer either of them. I however, left her a voicemail letting her know that Dr. Darrick Huntsman wanted [place] to get in contact with her to go ahead and schedule her a colonoscopy. I left her my direct number to call me back and schedule her colonoscopy.

## 2023-05-05 NOTE — Assessment & Plan Note (Signed)
Improved control on 3 drug regimen of  amlodipine to 10 mg daily , losartan 100 mg daily and metoprolol 25 mg bid

## 2023-05-05 NOTE — Assessment & Plan Note (Signed)
Cwith concurrent  right hip pain.  Reviewed films noting  normal hip joint,  Spondylosis of lumbar spine. 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  Continue prn use of tramadol,  PT prn Refill history confirmed via [place] Controlled Substance databas, accessed by me today.Marland Kitchen

## 2023-05-05 NOTE — Assessment & Plan Note (Signed)
Has not occurred in years  reminded her of the proper use of NTG given her risk factors for CAD,  corrected her from using  NTG  for tachycardia

## 2023-05-05 NOTE — Assessment & Plan Note (Signed)
Etiology unclear, but given her recurrent rectal bleeding,   normal  iron and B12 studies  in Feb 2024.   refer for colonoscopy Lab Results  Component Value Date   WBC 7.5 11/13/2022   HGB 11.4 (L) 11/13/2022   HCT 32.4 (L) 11/13/2022   MCV 91.7 11/13/2022   PLT 266.0 11/13/2022   Lab Results  Component Value Date   IRON 99 11/18/2022   TIBC 349 11/18/2022   FERRITIN 41 11/18/2022   Last vitamin B12 and Folate Lab Results  Component Value Date   VITAMINB12 339 11/18/2022   FOLATE 20.2 11/18/2022

## 2023-05-05 NOTE — Assessment & Plan Note (Signed)
Advised her to proceed with colonoscopy

## 2023-05-05 NOTE — Assessment & Plan Note (Signed)
She remains  well-controlled on diet alone.  Patient is up-to-date on eye exams and foot exam has been  normal.   Patient has no proteinuria and is tolerating atorvastatin  for CAD risk reduction.    Continue use of daily aspirin  81 mg, statin  And losartan .Encouraged to quit smoking.  Distal pulses are normal  Lab Results  Component Value Date   HGBA1C 6.0 10/15/2022   Lab Results  Component Value Date   MICROALBUR 1.7 10/15/2022

## 2023-05-06 NOTE — Telephone Encounter (Signed)
Called patient and left her a voicemail again to call us back to schedule her procedure. I will also send her a patient message to call us back if she did wished to schedule her procedure due to continuing to have rectal bleeding.

## 2023-05-11 NOTE — Telephone Encounter (Signed)
Called patient again to the two phone numbers listed in her chart and had to leave her a voicemail to please return our call and schedule her colonoscopy. Awaiting for patient to call us back as she never read her message through MyChart either.

## 2023-06-09 ENCOUNTER — Telehealth: Payer: Self-pay | Admitting: *Deleted

## 2023-06-09 ENCOUNTER — Telehealth: Payer: Self-pay | Admitting: Gastroenterology

## 2023-06-09 ENCOUNTER — Other Ambulatory Visit: Payer: Self-pay | Admitting: *Deleted

## 2023-06-09 DIAGNOSIS — K625 Hemorrhage of anus and rectum: Secondary | ICD-10-CM

## 2023-06-09 MED ORDER — NA SULFATE-K SULFATE-MG SULF 17.5-3.13-1.6 GM/177ML PO SOLN: 1.0000 | Freq: Once | ORAL | 0 refills | Status: AC

## 2023-06-09 NOTE — Telephone Encounter (Signed)
Spoken to patient and was able to schedule her for a colonoscopy on 07/16/2023.  Instructions and prep solution, Suprep have been sent

## 2023-06-09 NOTE — Telephone Encounter (Signed)
 Pt requested call back to schedule colonoscopy

## 2023-06-09 NOTE — Telephone Encounter (Signed)
Jane Davidson, [facility]   05/11/23  3:56 PM  Note Called patient again to the two phone numbers listed in her chart and had to leave her a voicemail to please return our call and schedule her colonoscopy. Awaiting for patient to call us back as she never read her message through MyChart either.    Jane Davidson, [facility]  05/06/23 10:02 AM  Note Called patient and left her a voicemail again to call us back to schedule her procedure. I will also send her a patient message to call us back if she did wished to schedule her procedure due to continuing to have rectal bleeding.     Jane Davidson, [employer] [facility]  05/05/23  9:08 AM  Note Per Dr. Darrick Huntsman: seeing patient for follow up. she states that she is still having rectal bleeding and would like to schedule the colonoscopy.   I then called patient's cell and home phone and she did not answer either of them. I however, left her a voicemail letting her know that Dr. Darrick Huntsman wanted [place] to get in contact with her to go ahead and schedule her a colonoscopy. I left her my direct number to call me back and schedule her colonoscopy.

## 2023-06-09 NOTE — Telephone Encounter (Signed)
Colonoscopy schedule for 07/16/2023 with Dr Tobi Bastos

## 2023-06-24 ENCOUNTER — Telehealth: Payer: Self-pay | Admitting: *Deleted

## 2023-06-24 NOTE — Telephone Encounter (Signed)
Received procedure clearance from [facility] cardiology   Patient is cleared to have procedure and should stop taking Eliquis 5 mg 3 days prior and restart as soon as safely possible at the discretion of the surgeon  Will call patient on Friday, 07/10/2023 to inform patient to stop on Monday, 07/13/2023

## 2023-07-10 NOTE — Telephone Encounter (Signed)
Patient have been notified regarding her Eliquis.  Patient verbalized understanding.

## 2023-07-16 ENCOUNTER — Encounter: Payer: Self-pay | Admitting: Certified Registered"

## 2023-07-16 ENCOUNTER — Other Ambulatory Visit: Payer: Self-pay | Admitting: *Deleted

## 2023-07-16 ENCOUNTER — Encounter
Admission: RE | Disposition: A | Discharge status: Home / Self Care | Payer: Self-pay | Source: Home / Self Care | Attending: Gastroenterology

## 2023-07-16 ENCOUNTER — Ambulatory Visit
Admission: RE | Admit: 2023-07-16 | Discharge: 2023-07-16 | Disposition: A | Discharge status: Home / Self Care | Payer: PPO | Attending: Gastroenterology | Admitting: Gastroenterology

## 2023-07-16 ENCOUNTER — Telehealth: Payer: Self-pay | Admitting: *Deleted

## 2023-07-16 DIAGNOSIS — Z5309 Procedure and treatment not carried out because of other contraindication: Secondary | ICD-10-CM | POA: Diagnosis not present

## 2023-07-16 DIAGNOSIS — K625 Hemorrhage of anus and rectum: Secondary | ICD-10-CM | POA: Insufficient documentation

## 2023-07-16 DIAGNOSIS — Z1211 Encounter for screening for malignant neoplasm of colon: Secondary | ICD-10-CM

## 2023-07-16 HISTORY — PX: COLONOSCOPY WITH PROPOFOL: SHX5780

## 2023-07-16 SURGERY — COLONOSCOPY WITH PROPOFOL
Anesthesia: General

## 2023-07-16 MED ORDER — SODIUM CHLORIDE 0.9 % IV SOLN: INTRAVENOUS | Status: DC

## 2023-07-16 MED ORDER — NA SULFATE-K SULFATE-MG SULF 17.5-3.13-1.6 GM/177ML PO SOLN: 1.0000 | Freq: Once | ORAL | 0 refills | Status: AC

## 2023-07-16 NOTE — Progress Notes (Signed)
Patient here for colonoscopy. She took her Eliquis yesterday, stating "it said to take it on the paper work". I mentioned that the office had called her on 9/27 and gave instructions for Eliquis hold and she said she don't remember that phone call. Spoke with Misty Stanley daughter and explained that procedure would need to be rescheduled and to help patient with her medications and holding the Eliquis for three days prior to colonoscopy when rescheduled. She agreed.

## 2023-07-16 NOTE — Telephone Encounter (Signed)
Spoken to patient's daughter, will go over the medications 4 days ahead of colonoscopy so patient will stop certain medication.

## 2023-07-16 NOTE — Telephone Encounter (Signed)
Pt's daughter, Misty Stanley left a voicemail requesting a called back. She stated she had a few questions. She wanted you to call her on her mother's cell. [phone].

## 2023-07-16 NOTE — Telephone Encounter (Signed)
Spoken to patient's daughter and patient. The colonoscopy was not completed because did not hold her Eliquis.  Explain to daughter why is not on the paperwork like the other medications.  Requesting to reschedule to 08/31/2023  New instructions will be sent.  Patient verbalized understanding.

## 2023-07-17 ENCOUNTER — Encounter: Payer: Self-pay | Admitting: Gastroenterology

## 2023-07-31 ENCOUNTER — Other Ambulatory Visit: Payer: Self-pay | Admitting: Internal Medicine

## 2023-08-31 ENCOUNTER — Ambulatory Visit
Admission: RE | Admit: 2023-08-31 | Discharge: 2023-08-31 | Disposition: A | Discharge status: Home / Self Care | Payer: PPO | Attending: Gastroenterology | Admitting: Gastroenterology

## 2023-08-31 ENCOUNTER — Encounter: Payer: Self-pay | Admitting: Gastroenterology

## 2023-08-31 ENCOUNTER — Encounter
Admission: RE | Disposition: A | Discharge status: Home / Self Care | Payer: Self-pay | Source: Home / Self Care | Attending: Gastroenterology

## 2023-08-31 ENCOUNTER — Ambulatory Visit: Payer: PPO | Admitting: Anesthesiology

## 2023-08-31 DIAGNOSIS — K640 First degree hemorrhoids: Secondary | ICD-10-CM | POA: Insufficient documentation

## 2023-08-31 DIAGNOSIS — M8580 Other specified disorders of bone density and structure, unspecified site: Secondary | ICD-10-CM | POA: Diagnosis not present

## 2023-08-31 DIAGNOSIS — I4891 Unspecified atrial fibrillation: Secondary | ICD-10-CM | POA: Diagnosis not present

## 2023-08-31 DIAGNOSIS — Z7901 Long term (current) use of anticoagulants: Secondary | ICD-10-CM | POA: Diagnosis not present

## 2023-08-31 DIAGNOSIS — I739 Peripheral vascular disease, unspecified: Secondary | ICD-10-CM | POA: Insufficient documentation

## 2023-08-31 DIAGNOSIS — Z79890 Hormone replacement therapy (postmenopausal): Secondary | ICD-10-CM | POA: Diagnosis not present

## 2023-08-31 DIAGNOSIS — I10 Essential (primary) hypertension: Secondary | ICD-10-CM | POA: Insufficient documentation

## 2023-08-31 DIAGNOSIS — K5730 Diverticulosis of large intestine without perforation or abscess without bleeding: Secondary | ICD-10-CM | POA: Diagnosis not present

## 2023-08-31 DIAGNOSIS — E785 Hyperlipidemia, unspecified: Secondary | ICD-10-CM | POA: Diagnosis not present

## 2023-08-31 DIAGNOSIS — J449 Chronic obstructive pulmonary disease, unspecified: Secondary | ICD-10-CM | POA: Insufficient documentation

## 2023-08-31 DIAGNOSIS — F17210 Nicotine dependence, cigarettes, uncomplicated: Secondary | ICD-10-CM | POA: Insufficient documentation

## 2023-08-31 DIAGNOSIS — K625 Hemorrhage of anus and rectum: Secondary | ICD-10-CM | POA: Insufficient documentation

## 2023-08-31 DIAGNOSIS — Z539 Procedure and treatment not carried out, unspecified reason: Secondary | ICD-10-CM | POA: Diagnosis not present

## 2023-08-31 DIAGNOSIS — E039 Hypothyroidism, unspecified: Secondary | ICD-10-CM | POA: Diagnosis not present

## 2023-08-31 DIAGNOSIS — I480 Paroxysmal atrial fibrillation: Secondary | ICD-10-CM | POA: Diagnosis not present

## 2023-08-31 DIAGNOSIS — Z79899 Other long term (current) drug therapy: Secondary | ICD-10-CM | POA: Diagnosis not present

## 2023-08-31 HISTORY — PX: COLONOSCOPY WITH PROPOFOL: SHX5780

## 2023-08-31 SURGERY — COLONOSCOPY WITH PROPOFOL
Anesthesia: General

## 2023-08-31 MED ORDER — PROPOFOL 10 MG/ML IV BOLUS
INTRAVENOUS | Status: DC | PRN
  Administered 2023-08-31: 125 ug/kg/min via INTRAVENOUS
  Administered 2023-08-31: 50 mg via INTRAVENOUS

## 2023-08-31 MED ORDER — LIDOCAINE HCL (CARDIAC) PF 100 MG/5ML IV SOSY
PREFILLED_SYRINGE | INTRAVENOUS | Status: DC | PRN
Start: 2023-08-31 — End: 2023-08-31
  Administered 2023-08-31: 20 mg via INTRAVENOUS

## 2023-08-31 MED ORDER — SODIUM CHLORIDE 0.9 % IV SOLN
INTRAVENOUS | Status: DC
  Administered 2023-08-31: 1000 mL via INTRAVENOUS

## 2023-08-31 NOTE — Anesthesia Procedure Notes (Signed)
Date/Time: 08/31/2023 9:39 AM  Performed by: Ginger Carne, CRNAPre-anesthesia Checklist: Patient identified, Emergency Drugs available, Suction available, Patient being monitored and Timeout performed Patient Re-evaluated:Patient Re-evaluated prior to induction Oxygen Delivery Method: Nasal cannula Preoxygenation: Pre-oxygenation with 100% oxygen Induction Type: IV induction

## 2023-08-31 NOTE — H&P (Signed)
[name], MD [address], [address], [place], [place], [address] [address], [address], [place], [place], [address] Phone: [phone]  Fax: [phone]  Primary Care Physician:  Sherlene Shams, MD   Pre-Procedure History & Physical: HPI:  Jane Davidson is a 82 y.o. female is here for an colonoscopy.   Past Medical History:  Diagnosis Date   Abnormal Pap smear of cervix 2001   Biopsy normal   Bursitis NEC    Cancer (HCC)    skin   COPD (chronic obstructive pulmonary disease) (HCC)    Gastritis, Helicobacter pylori 06/20/2021   History of peripheral vascular disease 2001   s/p bilateral aorto-iliac-femoral bypass /duke   Hyperlipidemia    hypothyroid    Osteopenia 2009   T scores - 1.5 DEXA 2009   Screening for breast cancer 02/2011   [facility] mammogram   Tobacco abuse disorder    Vitamin D deficiency 01/2010   replaced withDrsido for level of 11.3 ng/ml   Vitamin D deficiency 01/2010   replaced withDrsido for level of 11.3 ng/ml    Past Surgical History:  Procedure Laterality Date   BREAST BIOPSY Left    benign   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 Authorizing Provider  amLODipine (NORVASC) 10 MG tablet Take 1 tablet (10 mg total) by mouth daily. 05/04/23   Sherlene Shams, MD  apixaban (ELIQUIS) 5 MG TABS tablet TAKE 1 TABLET(5 MG) BY MOUTH TWICE DAILY 05/04/23   Sherlene Shams, MD  atorvastatin (LIPITOR) 20 MG tablet TAKE 1 TABLET(20 MG) BY MOUTH DAILY 03/11/23   Sherlene Shams, MD  Calcium Carb-Cholecalciferol (CALCIUM 600 + D PO) Take 1 tablet by mouth daily.  Calcium 600 mg and Vitamin D 800 IUs    [provider]  clobetasol ointment (TEMOVATE) 0.05 % Apply 1 application topically 2 (two) times daily. 05/23/16   Sherlene Shams, MD  fluticasone (FLONASE) 50 MCG/ACT nasal spray SHAKE LIQUID AND USE 2 SPRAYS IN EACH NOSTRIL DAILY 07/31/23   Sherlene Shams, MD  fluticasone-salmeterol (WIXELA INHUB) 100-50 MCG/ACT AEPB Inhale 1 puff into the lungs 2 (two) times daily. 05/04/23   Sherlene Shams, MD  levothyroxine (SYNTHROID) 88 MCG tablet Take 1 tablet (88 mcg total) by mouth daily before breakfast. 05/04/23   Sherlene Shams, MD  loratadine (CLARITIN) 10 MG tablet Take 1 tablet (10 mg total) by mouth daily. 09/25/17   Eustaquio Boyden, MD  losartan (COZAAR) 100 MG tablet Take 1 tablet (100 mg total) by mouth daily. 05/04/23   Sherlene Shams, MD  metoprolol tartrate (LOPRESSOR) 25 MG tablet Take 1 tablet (25 mg total) by mouth 2 (two) times daily. 05/04/23 10/31/23  Sherlene Shams, MD  nitroGLYCERIN (NITROSTAT) 0.4 MG SL tablet Place 1 tablet (0.4 mg total) under the tongue every 5 (five) minutes as needed for chest pain. 10/15/22   Sherlene Shams, MD  sertraline (ZOLOFT) 50 MG tablet TAKE 1 TABLET(50 MG) BY MOUTH DAILY 05/04/23   Sherlene Shams, MD  traMADol (ULTRAM) 50 MG tablet Take 1 tablet (50 mg total) by mouth every 12 (twelve) hours as needed for moderate pain. 11/04/22   Sherlene Shams, MD  triamcinolone cream (KENALOG) 0.1 % APPLY TOPICALLY TO THE AFFECTED AREA TWICE DAILY 12/15/22   Kara Dies, NP    Allergies as of 07/16/2023 - Review Complete 07/16/2023  Allergen Reaction Noted   Bee pollen Other (See Comments) 02/25/2022   Pollen extract     Hctz [hydrochlorothiazide]  08/13/2018   Other Cough 03/30/2018    Family History  Problem Relation Age of Onset   Hypertension Mother    Arthritis Mother    Diabetes Father    Dementia Sister    Breast cancer Neg Hx     Social History   Socioeconomic History   Marital status:  Widowed    Spouse name: Not on file   Number of children: Not on file   Years of education: Not on file   Highest education level: Not on file  Occupational History   Not on file  Tobacco Use   Smoking status: Every Day    Current packs/day: 1.04    Average packs/day: 1 pack/day for 40.0 years (41.6 ttl pk-yrs)    Types: Cigarettes   Smokeless tobacco: Never  Vaping Use   Vaping status: Never Used  Substance and Sexual Activity   Alcohol use: No   Drug use: No   Sexual activity: Never  Other Topics Concern   Not on file  Social History Narrative   Not on file   Social Determinants of Health   Financial Resource Strain: Low Risk  (10/17/2022)   Overall Financial Resource Strain (CARDIA)    Difficulty of Paying Living Expenses: Not very hard  Food Insecurity: No Food Insecurity (10/17/2022)   Hunger Vital Sign    Worried About Running Out of Food in the Last Year: Never true    Ran Out of Food in the Last Year: Never true  Transportation Needs: No Transportation Needs (10/17/2022)   PRAPARE - Administrator, Civil Service (Medical): No    Lack of Transportation (Non-Medical): No  Physical Activity: Not on file  Stress: No Stress Concern Present (10/17/2022)   [employer] of Occupational Health - Occupational Stress Questionnaire    Feeling of Stress : Only a little  Social Connections: Unknown (10/17/2022)   Social Connection and Isolation Panel [NHANES]    Frequency of Communication with Friends and Family: More than three times a week    Frequency of Social Gatherings with Friends and Family: Once a week    Attends Religious Services: More than 4 times per year    Active Member of [employer] or Organizations: Yes    Attends Banker Meetings: Not on file    Marital Status: Not on file  Intimate Partner Violence: Not At Risk (10/17/2022)   Humiliation, Afraid, Rape, and Kick questionnaire    Fear of Current or Ex-Partner: No    Emotionally Abused: No     Physically Abused: No    Sexually Abused: No    Review of Systems: See HPI, otherwise negative ROS  Physical Exam: There were no vitals taken for this visit. General:   Alert,  pleasant and cooperative in NAD Head:  Normocephalic and atraumatic. Neck:  Supple; no masses or thyromegaly. Lungs:  Clear throughout to auscultation, normal respiratory effort.    Heart:  +S1, +S2, Regular rate and rhythm, No edema. Abdomen:  Soft, nontender  and nondistended. Normal bowel sounds, without guarding, and without rebound.   Neurologic:  Alert and  oriented x4;  grossly normal neurologically.  Impression/Plan: Jane Davidson is here for an colonoscopy to be performed for rectal bleeding  Risks, benefits, limitations, and alternatives regarding  colonoscopy have been reviewed with the patient.  Questions have been answered.  All parties agreeable.   [name], MD  08/31/2023, 8:58 AM

## 2023-08-31 NOTE — Anesthesia Postprocedure Evaluation (Signed)
Anesthesia Post Note  Patient: NECHY DOMINQUEZ  Procedure(s) Performed: COLONOSCOPY WITH PROPOFOL  Patient location during evaluation: Endoscopy Anesthesia Type: General Level of consciousness: awake and alert Pain management: pain level controlled Vital Signs Assessment: post-procedure vital signs reviewed and stable Respiratory status: spontaneous breathing, nonlabored ventilation, respiratory function stable and patient connected to nasal cannula oxygen Cardiovascular status: blood pressure returned to baseline and stable Postop Assessment: no apparent nausea or vomiting Anesthetic complications: no   No notable events documented.   Last Vitals:  Vitals:   08/31/23 0908 08/31/23 0953  BP: 125/77 (!) 97/52  Pulse: 77 68  Resp: 16 18  Temp: (!) 35.7 C (!) 35.7 C  SpO2: 100% 100%    Last Pain:  Vitals:   08/31/23 0953  TempSrc: Tympanic  PainSc: Asleep                 Louie Boston

## 2023-08-31 NOTE — Op Note (Signed)
[facility] Gastroenterology Patient Name: Jane Davidson Procedure Date: 08/31/2023 9:26 AM MRN: [id number] Account #: [id number] Date of Birth: 29-Dec-1940 Admit Type: Outpatient Age: 82 Room: [facility] ENDO ROOM 1 Gender: Female Note Status: Finalized Instrument Name: Peds Colonoscope [id number] Procedure:             Colonoscopy Indications:           Rectal bleeding Providers:             [name] MD, MD Referring MD:          Duncan Dull, MD (Referring MD) Medicines:             Monitored Anesthesia Care Complications:         No immediate complications. Procedure:             Pre-Anesthesia Assessment:                        - Prior to the procedure, a History and Physical was                         performed, and patient medications, allergies and                         sensitivities were reviewed. The patient's tolerance                         of previous anesthesia was reviewed.                        - ASA Grade Assessment: II - A patient with mild                         systemic disease.                        After obtaining informed consent, the colonoscope was                         passed under direct vision. Throughout the procedure,                         the patient's blood pressure, pulse, and oxygen                         saturations were monitored continuously. The                         Colonoscope was introduced through the anus with the                         intention of advancing to the cecum. The scope was                         advanced to the sigmoid colon before the procedure was                         aborted. Medications were given. The colonoscopy was  performed with ease. The patient tolerated the                         procedure well. The quality of the bowel preparation                         was unsatisfactory. Findings:      The perianal and digital rectal examinations were normal.       Multiple medium-mouthed diverticula were found in the sigmoid colon.      Non-bleeding internal hemorrhoids were found during retroflexion. The       hemorrhoids were medium-sized and Grade I (internal hemorrhoids that do       not prolapse).      Solid stool was found in the sigmoid colon, interfering with       visualization. Impression:            - Preparation of the colon was unsatisfactory.                        - Diverticulosis in the sigmoid colon.                        - Non-bleeding internal hemorrhoids.                        - Stool in the sigmoid colon.                        - No specimens collected. Recommendation:        - Discharge patient to home (with escort).                        - Resume previous diet.                        - Continue present medications.                        - Repeat colonoscopy in 3 weeks because the bowel                         preparation was suboptimal. Procedure Code(s):     --- Professional ---                        [id number], 53, Colonoscopy, flexible; diagnostic,                         including collection of specimen(s) by brushing or                         washing, when performed (separate procedure) Diagnosis Code(s):     --- Professional ---                        K64.0, First degree hemorrhoids                        K62.5, Hemorrhage of anus and rectum                        K57.30, Diverticulosis of  large intestine without                         perforation or abscess without bleeding CPT copyright 2022 American Medical Association. All rights reserved. The codes documented in this report are preliminary and upon coder review may  be revised to meet current compliance requirements. [name], MD [name] MD, MD 08/31/2023 9:47:37 AM This report has been signed electronically. Number of Addenda: 0 Note Initiated On: 08/31/2023 9:26 AM Total Procedure Duration: 0 hours 2 minutes 39 seconds  Estimated Blood Loss:   Estimated blood loss: none.      [facility]

## 2023-08-31 NOTE — Anesthesia Preprocedure Evaluation (Addendum)
Anesthesia Evaluation  Patient identified by MRN, date of birth, ID band Patient awake    Reviewed: Allergy & Precautions, NPO status , Patient's Chart, lab work & pertinent test results  History of Anesthesia Complications Negative for: history of anesthetic complications  Airway Mallampati: III  TM Distance: >3 FB Neck ROM: full    Dental  (+) Upper Dentures, Lower Dentures   Pulmonary COPD, Current Smoker and Patient abstained from smoking.   Pulmonary exam normal        Cardiovascular hypertension, On Medications and On Home Beta Blockers + Peripheral Vascular Disease  + dysrhythmias Atrial Fibrillation      Neuro/Psych  PSYCHIATRIC DISORDERS  Depression    negative neurological ROS     GI/Hepatic negative GI ROS, Neg liver ROS,,,  Endo/Other  diabetesHypothyroidism    Renal/GU negative Renal ROS  negative genitourinary   Musculoskeletal   Abdominal   Peds  Hematology  (+) Blood dyscrasia, anemia   Anesthesia Other Findings Past Medical History: 2001: Abnormal Pap smear of cervix     Comment:  Biopsy normal No date: Bursitis NEC No date: Cancer (HCC)     Comment:  skin No date: COPD (chronic obstructive pulmonary disease) (HCC) 06/20/2021: Gastritis, Helicobacter pylori 2001: History of peripheral vascular disease     Comment:  s/p bilateral aorto-iliac-femoral bypass /duke No date: Hyperlipidemia No date: hypothyroid 2009: Osteopenia     Comment:  T scores - 1.5 DEXA 2009 02/2011: Screening for breast cancer     Comment:  [facility] mammogram No date: Tobacco abuse disorder 01/2010: Vitamin D deficiency     Comment:  replaced withDrsido for level of 11.3 ng/ml 01/2010: Vitamin D deficiency     Comment:  replaced withDrsido for level of 11.3 ng/ml  Past Surgical History: No date: BREAST BIOPSY; Left     Comment:  benign 2016: CAROTID ENDARTERECTOMY; Right     Comment:  Festus Barren Sept 2011: CATARACT  EXTRACTION No date: CHOLECYSTECTOMY 07/16/2023: COLONOSCOPY WITH PROPOFOL; N/A     Comment:  Procedure: COLONOSCOPY WITH PROPOFOL;  Surgeon: [name], MD;  Location: [facility] ENDOSCOPY;  Service:               Gastroenterology;  Laterality: N/A; Jun 2011: HERNIA REPAIR     Comment:  Renda Rolls  No date: TUBAL LIGATION     Reproductive/Obstetrics negative OB ROS                             Anesthesia Physical Anesthesia Plan  ASA: 3  Anesthesia Plan: General   Post-op Pain Management: Minimal or no pain anticipated   Induction: Intravenous  PONV Risk Score and Plan: 2 and Propofol infusion and TIVA  Airway Management Planned: Natural Airway and Nasal Cannula  Additional Equipment:   Intra-op Plan:   Post-operative Plan:   Informed Consent: I have reviewed the patients History and Physical, chart, labs and discussed the procedure including the risks, benefits and alternatives for the proposed anesthesia with the patient or authorized representative who has indicated his/her understanding and acceptance.     Dental Advisory Given  Plan Discussed with: Anesthesiologist, CRNA and Surgeon  Anesthesia Plan Comments: (Patient consented for risks of anesthesia including but not limited to:  - adverse reactions to medications - risk of airway placement if required - damage to eyes, teeth, lips or other oral mucosa -  nerve damage due to positioning  - sore throat or hoarseness - Damage to heart, brain, nerves, lungs, other parts of body or loss of life  Patient voiced understanding and assent.)        Anesthesia Quick Evaluation

## 2023-08-31 NOTE — Transfer of Care (Signed)
Immediate Anesthesia Transfer of Care Note  Patient: Jane Davidson  Procedure(s) Performed: COLONOSCOPY WITH PROPOFOL  Patient Location: PACU  Anesthesia Type:General  Level of Consciousness: drowsy  Airway & Oxygen Therapy: Patient Spontanous Breathing and Patient connected to nasal cannula oxygen  Post-op Assessment: Report given to RN and Post -op Vital signs reviewed and stable  Post vital signs: Reviewed and stable  Last Vitals:  Vitals Value Taken Time  BP 97/52 08/31/23 0953  Temp 35.7 C 08/31/23 0953  Pulse 92 08/31/23 0954  Resp 18 08/31/23 0953  SpO2 100 % 08/31/23 0954  Vitals shown include unfiled device data.  Last Pain:  Vitals:   08/31/23 0953  TempSrc: Tympanic  PainSc: Asleep         Complications: No notable events documented.

## 2023-09-01 ENCOUNTER — Encounter: Payer: Self-pay | Admitting: Gastroenterology

## 2023-09-23 ENCOUNTER — Ambulatory Visit: Payer: PPO | Admitting: Family Medicine

## 2023-09-23 ENCOUNTER — Ambulatory Visit: Payer: PPO | Admitting: Family

## 2023-09-23 DIAGNOSIS — J209 Acute bronchitis, unspecified: Secondary | ICD-10-CM | POA: Diagnosis not present

## 2023-09-23 DIAGNOSIS — J019 Acute sinusitis, unspecified: Secondary | ICD-10-CM | POA: Diagnosis not present

## 2023-09-24 ENCOUNTER — Ambulatory Visit: Payer: PPO | Admitting: Family Medicine

## 2023-09-29 ENCOUNTER — Telehealth: Payer: Self-pay

## 2023-09-29 NOTE — Telephone Encounter (Signed)
Entering results Colonoscopy 08/31/23 Dr.anna- Patient needs repeat Colonoscopy -spoke with patient -she prefers to wait till end of January or sometime in February. I have placed patient in recall box . Patient stated she has been constipated and she was encouraged to drink 64 ounces of fluids and try taking miralax and if stools become loose then may need to take every other day .Patient also stated she is taking probiotics.

## 2023-10-20 ENCOUNTER — Telehealth: Payer: Self-pay | Admitting: Internal Medicine

## 2023-10-20 ENCOUNTER — Other Ambulatory Visit (INDEPENDENT_AMBULATORY_CARE_PROVIDER_SITE_OTHER): Payer: PPO

## 2023-10-20 ENCOUNTER — Other Ambulatory Visit: Payer: Self-pay | Admitting: Internal Medicine

## 2023-10-20 ENCOUNTER — Ambulatory Visit: Payer: PPO | Admitting: *Deleted

## 2023-10-20 VITALS — BP 136/80 | HR 74 | Temp 96.8°F | Ht 64.0 in | Wt 126.0 lb

## 2023-10-20 DIAGNOSIS — E034 Atrophy of thyroid (acquired): Secondary | ICD-10-CM | POA: Diagnosis not present

## 2023-10-20 DIAGNOSIS — E559 Vitamin D deficiency, unspecified: Secondary | ICD-10-CM | POA: Diagnosis not present

## 2023-10-20 DIAGNOSIS — D649 Anemia, unspecified: Secondary | ICD-10-CM | POA: Diagnosis not present

## 2023-10-20 DIAGNOSIS — D6869 Other thrombophilia: Secondary | ICD-10-CM

## 2023-10-20 DIAGNOSIS — Z23 Encounter for immunization: Secondary | ICD-10-CM

## 2023-10-20 DIAGNOSIS — E7800 Pure hypercholesterolemia, unspecified: Secondary | ICD-10-CM

## 2023-10-20 DIAGNOSIS — L309 Dermatitis, unspecified: Secondary | ICD-10-CM | POA: Diagnosis not present

## 2023-10-20 DIAGNOSIS — Z0000 Encounter for general adult medical examination without abnormal findings: Secondary | ICD-10-CM

## 2023-10-20 DIAGNOSIS — E785 Hyperlipidemia, unspecified: Secondary | ICD-10-CM | POA: Diagnosis not present

## 2023-10-20 DIAGNOSIS — R110 Nausea: Secondary | ICD-10-CM

## 2023-10-20 DIAGNOSIS — E1151 Type 2 diabetes mellitus with diabetic peripheral angiopathy without gangrene: Secondary | ICD-10-CM

## 2023-10-20 DIAGNOSIS — I480 Paroxysmal atrial fibrillation: Secondary | ICD-10-CM | POA: Diagnosis not present

## 2023-10-20 DIAGNOSIS — Z720 Tobacco use: Secondary | ICD-10-CM | POA: Diagnosis not present

## 2023-10-20 DIAGNOSIS — I10 Essential (primary) hypertension: Secondary | ICD-10-CM | POA: Diagnosis not present

## 2023-10-20 LAB — CBC WITH DIFFERENTIAL/PLATELET
Basophils Absolute: 0 10*3/uL (ref 0.0–0.1)
Basophils Relative: 0.6 % (ref 0.0–3.0)
Eosinophils Absolute: 0 10*3/uL (ref 0.0–0.7)
Eosinophils Relative: 0 % (ref 0.0–5.0)
HCT: 35.3 % — ABNORMAL LOW (ref 36.0–46.0)
Hemoglobin: 12.1 g/dL (ref 12.0–15.0)
Lymphocytes Relative: 8.7 % — ABNORMAL LOW (ref 12.0–46.0)
Lymphs Abs: 0.6 10*3/uL — ABNORMAL LOW (ref 0.7–4.0)
MCHC: 34.3 g/dL (ref 30.0–36.0)
MCV: 90.9 fL (ref 78.0–100.0)
Monocytes Absolute: 0.6 10*3/uL (ref 0.1–1.0)
Monocytes Relative: 7.9 % (ref 3.0–12.0)
Neutro Abs: 6 10*3/uL (ref 1.4–7.7)
Neutrophils Relative %: 82.8 % — ABNORMAL HIGH (ref 43.0–77.0)
Platelets: 297 10*3/uL (ref 150.0–400.0)
RBC: 3.89 Mil/uL (ref 3.87–5.11)
RDW: 14.1 % (ref 11.5–15.5)
WBC: 7.3 10*3/uL (ref 4.0–10.5)

## 2023-10-20 LAB — LIPID PANEL
Cholesterol: 142 mg/dL (ref 0–200)
HDL: 63.8 mg/dL (ref >39.00)
LDL Cholesterol: 65 mg/dL (ref 0–99)
NonHDL: 77.75
Total CHOL/HDL Ratio: 2
Triglycerides: 63 mg/dL (ref 0.0–149.0)
VLDL: 12.6 mg/dL (ref 0.0–40.0)

## 2023-10-20 LAB — COMPREHENSIVE METABOLIC PANEL
ALT: 9 U/L (ref 0–35)
AST: 16 U/L (ref 0–37)
Albumin: 4.1 g/dL (ref 3.5–5.2)
Alkaline Phosphatase: 61 U/L (ref 39–117)
BUN: 12 mg/dL (ref 6–23)
CO2: 29 meq/L (ref 19–32)
Calcium: 9 mg/dL (ref 8.4–10.5)
Chloride: 99 meq/L (ref 96–112)
Creatinine, Ser: 0.76 mg/dL (ref 0.40–1.20)
GFR: 73.69 mL/min (ref >60.00)
Glucose, Bld: 118 mg/dL — ABNORMAL HIGH (ref 70–99)
Potassium: 4 meq/L (ref 3.5–5.1)
Sodium: 133 meq/L — ABNORMAL LOW (ref 135–145)
Total Bilirubin: 0.4 mg/dL (ref 0.2–1.2)
Total Protein: 6.6 g/dL (ref 6.0–8.3)

## 2023-10-20 LAB — VITAMIN D 25 HYDROXY (VIT D DEFICIENCY, FRACTURES): VITD: 35.6 ng/mL (ref 30.00–100.00)

## 2023-10-20 LAB — TSH: TSH: 1.14 u[IU]/mL (ref 0.35–5.50)

## 2023-10-20 LAB — HEMOGLOBIN A1C: Hgb A1c MFr Bld: 6.2 % (ref 4.6–6.5)

## 2023-10-20 LAB — LDL CHOLESTEROL, DIRECT: Direct LDL: 60 mg/dL

## 2023-10-20 NOTE — Patient Instructions (Signed)
 Jane Davidson , Thank you for taking time to come for your Medicare Wellness Visit. I appreciate your ongoing commitment to your health goals. Please review the following plan we discussed and let me know if I can assist you in the future.   Referrals/Orders/Follow-Ups/Clinician Recommendations: Remember to keep your upcoming appointment with your PCP  This is a list of the screening recommended for you and due dates:  Health Maintenance  Topic Date Due   Complete foot exam   09/18/2022   Mammogram  05/07/2023   COVID-19 Vaccine (4 - 2024-25 season) 06/14/2023   Yearly kidney health urinalysis for diabetes  10/16/2023   Hemoglobin A1C  11/04/2023   Eye exam for diabetics  12/10/2023   Yearly kidney function blood test for diabetes  05/03/2024   Medicare Annual Wellness Visit  10/19/2024   DTaP/Tdap/Td vaccine (3 - Td or Tdap) 10/23/2031   Pneumonia Vaccine  Completed   Flu Shot  Completed   DEXA scan (bone density measurement)  Completed   Zoster (Shingles) Vaccine  Completed   HPV Vaccine  Aged Out   Screening for Lung Cancer  Discontinued   Hepatitis C Screening  Discontinued    Advanced directives: (Copy Requested) Please bring a copy of your health care power of attorney and living will to the office to be added to your chart at your convenience.  Next Medicare Annual Wellness Visit scheduled for next year: Yes 10/21/24 @ 9:30

## 2023-10-20 NOTE — Telephone Encounter (Signed)
 Patient need lab orders.

## 2023-10-20 NOTE — Progress Notes (Signed)
 Subjective:   Jane Davidson is a 83 y.o. female who presents for Medicare Annual (Subsequent) preventive examination.  Visit Complete: In person  Patient Medicare AWV questionnaire was completed by the patient on 10/12/24; I have confirmed that all information answered by patient is correct and no changes since this date.  Cardiac Risk Factors include: advanced age (>36men, >57 women);diabetes mellitus;dyslipidemia;hypertension;smoking/ tobacco exposure     Objective:    Today's Vitals   10/20/23 1134  BP: 136/80  Pulse: 74  Temp: (!) 96.8 F (36 C)  TempSrc: Skin  SpO2: 98%  Weight: 126 lb (57.2 kg)  Height: 5' 4 (1.626 m)   Body mass index is 21.63 kg/m.     10/20/2023   11:54 AM 08/31/2023    9:05 AM 10/17/2022    9:09 AM 10/16/2021    9:59 AM 10/15/2020   10:04 AM 04/07/2020    9:00 PM 04/07/2020    3:01 PM  Advanced Directives  Does Patient Have a Medical Advance Directive? Yes Yes Yes Yes Yes Yes Yes  Type of Estate Agent of [place];Living will  Healthcare Power of [employer] Power of [employer] Power of [employer] Power of Attorney   Does patient want to make changes to medical advance directive?   No - Patient declined No - Patient declined No - Patient declined No - Patient declined   Copy of Healthcare Power of Attorney in Chart? No - copy requested  No - copy requested No - copy requested No - copy requested No - copy requested     Current Medications (verified) Outpatient Encounter Medications as of 10/20/2023  Medication Sig   amLODipine  (NORVASC ) 10 MG tablet Take 1 tablet (10 mg total) by mouth daily.   apixaban  (ELIQUIS ) 5 MG TABS tablet TAKE 1 TABLET(5 MG) BY MOUTH TWICE DAILY   atorvastatin  (LIPITOR) 20 MG tablet TAKE 1 TABLET(20 MG) BY MOUTH DAILY   Calcium  Carb-Cholecalciferol (CALCIUM  600 + D PO) Take 1 tablet by mouth daily. Calcium  600 mg and Vitamin D  800 IUs   clobetasol  ointment (TEMOVATE ) 0.05 %  Apply 1 application topically 2 (two) times daily.   fluticasone  (FLONASE ) 50 MCG/ACT nasal spray SHAKE LIQUID AND USE 2 SPRAYS IN EACH NOSTRIL DAILY   fluticasone -salmeterol (WIXELA INHUB) 100-50 MCG/ACT AEPB Inhale 1 puff into the lungs 2 (two) times daily.   levothyroxine  (SYNTHROID ) 88 MCG tablet Take 1 tablet (88 mcg total) by mouth daily before breakfast.   loratadine  (CLARITIN ) 10 MG tablet Take 1 tablet (10 mg total) by mouth daily.   losartan  (COZAAR ) 100 MG tablet Take 1 tablet (100 mg total) by mouth daily.   metoprolol  tartrate (LOPRESSOR ) 25 MG tablet Take 1 tablet (25 mg total) by mouth 2 (two) times daily.   nitroGLYCERIN  (NITROSTAT ) 0.4 MG SL tablet Place 1 tablet (0.4 mg total) under the tongue every 5 (five) minutes as needed for chest pain.   sertraline  (ZOLOFT ) 50 MG tablet TAKE 1 TABLET(50 MG) BY MOUTH DAILY   triamcinolone  cream (KENALOG ) 0.1 % APPLY TOPICALLY TO THE AFFECTED AREA TWICE DAILY   traMADol  (ULTRAM ) 50 MG tablet Take 1 tablet (50 mg total) by mouth every 12 (twelve) hours as needed for moderate pain. (Patient not taking: Reported on 10/20/2023)   No facility-administered encounter medications on file as of 10/20/2023.    Allergies (verified) Bee pollen, Pollen extract, Hctz [hydrochlorothiazide ], and Other   History: Past Medical History:  Diagnosis Date   Abnormal Pap smear  of cervix 2001   Biopsy normal   Bursitis NEC    Cancer (HCC)    skin   COPD (chronic obstructive pulmonary disease) (HCC)    Gastritis, Helicobacter pylori 06/20/2021   History of peripheral vascular disease 2001   s/p bilateral aorto-iliac-femoral bypass /[facility]   Hyperlipidemia    hypothyroid    Osteopenia 2009   T scores - 1.5 DEXA 2009   Screening for breast cancer 02/2011   [facility] mammogram   Tobacco abuse disorder    Vitamin D  deficiency 01/2010   replaced withDrsido for level of 11.3 ng/ml   Vitamin D  deficiency 01/2010   replaced withDrsido for level of 11.3 ng/ml    Past Surgical History:  Procedure Laterality Date   BREAST BIOPSY Left    benign   CAROTID ENDARTERECTOMY Right 2016   Selinda Gu   CATARACT EXTRACTION  Sept 2011   CHOLECYSTECTOMY     COLONOSCOPY WITH PROPOFOL  N/A 07/16/2023   Procedure: COLONOSCOPY WITH PROPOFOL ;  Surgeon: Therisa Bi, MD;  Location: [facility] ENDOSCOPY;  Service: Gastroenterology;  Laterality: N/A;   COLONOSCOPY WITH PROPOFOL  N/A 08/31/2023   Procedure: COLONOSCOPY WITH PROPOFOL ;  Surgeon: Therisa Bi, MD;  Location: [facility] ENDOSCOPY;  Service: Gastroenterology;  Laterality: N/A;   HERNIA REPAIR  Jun 2011   [facility]    TUBAL LIGATION     Family History  Problem Relation Age of Onset   Hypertension Mother    Arthritis Mother    Diabetes Father    Dementia Sister    Breast cancer Neg Hx    Social History   Socioeconomic History   Marital status: Widowed    Spouse name: Not on file   Number of children: Not on file   Years of education: Not on file   Highest education level: Not on file  Occupational History   Not on file  Tobacco Use   Smoking status: Every Day    Current packs/day: 1.04    Average packs/day: 1 pack/day for 40.0 years (41.6 ttl pk-yrs)    Types: Cigarettes   Smokeless tobacco: Never  Vaping Use   Vaping status: Never Used  Substance and Sexual Activity   Alcohol use: No   Drug use: No   Sexual activity: Never  Other Topics Concern   Not on file  Social History Narrative   Not on file   Social Drivers of Health   Financial Resource Strain: Low Risk  (10/20/2023)   Overall Financial Resource Strain (CARDIA)    Difficulty of Paying Living Expenses: Not hard at all  Food Insecurity: No Food Insecurity (10/20/2023)   Hunger Vital Sign    Worried About Running Out of Food in the Last Year: Never true    Ran Out of Food in the Last Year: Never true  Transportation Needs: No Transportation Needs (10/20/2023)   PRAPARE - Administrator, Civil Service (Medical): No    Lack of  Transportation (Non-Medical): No  Physical Activity: Insufficiently Active (10/20/2023)   Exercise Vital Sign    Days of Exercise per Week: 2 days    Minutes of Exercise per Session: 20 min  Stress: No Stress Concern Present (10/20/2023)   [employer] of Occupational Health - Occupational Stress Questionnaire    Feeling of Stress : Only a little  Social Connections: Moderately Isolated (10/20/2023)   Social Connection and Isolation Panel [NHANES]    Frequency of Communication with Friends and Family: More than three times a week  Frequency of Social Gatherings with Friends and Family: More than three times a week    Attends Religious Services: More than 4 times per year    Active Member of Clubs or Organizations: No    Attends Banker Meetings: Never    Marital Status: Widowed    Tobacco Counseling Ready to quit: Not Answered Counseling given: Not Answered   Clinical Intake:  Pre-visit preparation completed: Yes  Pain : No/denies pain     BMI - recorded: 21.63 Nutritional Status: BMI of 19-24  Normal Nutritional Risks: None Diabetes: Yes CBG done?: No Did pt. bring in CBG monitor from home?: No  How often do you need to have someone help you when you read instructions, pamphlets, or other written materials from your doctor or pharmacy?: 1 - Never  Interpreter Needed?: No  Information entered by :: R. Treazure Nery LPN   Activities of Daily Living    10/20/2023   11:42 AM 10/13/2023   12:20 PM  In your present state of health, do you have any difficulty performing the following activities:  Hearing? 0 0  Vision? 0 0  Difficulty concentrating or making decisions? 0 0  Walking or climbing stairs? 0 0  Dressing or bathing? 0 0  Doing errands, shopping? 0 0  Preparing Food and eating ? N N  Using the Toilet? N N  In the past six months, have you accidently leaked urine? Y N  Comment wears a pad at night   Do you have problems with loss of bowel control? N N   Managing your Medications? N N  Managing your Finances? N N  Housekeeping or managing your Housekeeping? N N    Patient Care Team: Marylynn Verneita CROME, MD as PCP - General (Internal Medicine)  Indicate any recent Medical Services you may have received from other than [facility] providers in the past year (date may be approximate).     Assessment:   This is a routine wellness examination for Indira.  Hearing/Vision screen Hearing Screening - Comments:: No issues Vision Screening - Comments:: glasses   Goals Addressed             This Visit's Progress    Patient Stated       Wants to quit smoking        Depression Screen    10/20/2023   11:46 AM 05/04/2023    1:27 PM 12/08/2022    3:25 PM 11/13/2022    3:46 PM 11/03/2022    2:42 PM 10/17/2022    9:10 AM 10/15/2022   10:16 AM  PHQ 2/9 Scores  PHQ - 2 Score 0 1    0 0  PHQ- 9 Score 0        Exception Documentation   Patient refusal Patient refusal Patient refusal      Fall Risk    10/20/2023   11:43 AM 10/13/2023   12:20 PM 05/04/2023    1:27 PM 12/08/2022    3:25 PM 11/13/2022    3:46 PM  Fall Risk   Falls in the past year? 0 0 0 0 0  Number falls in past yr: 0  0 0 0  Injury with Fall? 0  0 0 0  Risk for fall due to : No Fall Risks  No Fall Risks No Fall Risks No Fall Risks  Follow up Falls prevention discussed;Falls evaluation completed  Falls evaluation completed Falls evaluation completed Falls evaluation completed    MEDICARE RISK AT HOME: Medicare  Risk at Home Any stairs in or around the home?: (Patient-Rptd) Yes If so, are there any without handrails?: (Patient-Rptd) Yes Home free of loose throw rugs in walkways, pet beds, electrical cords, etc?: (Patient-Rptd) Yes Adequate lighting in your home to reduce risk of falls?: (Patient-Rptd) Yes Life alert?: (Patient-Rptd) No Use of a cane, walker or w/c?: (Patient-Rptd) No Grab bars in the bathroom?: (Patient-Rptd) Yes Shower chair or bench in shower?: (Patient-Rptd)  No Elevated toilet seat or a handicapped toilet?: (Patient-Rptd) No  TIMED UP AND GO:  Was the test performed?  Yes  Length of time to ambulate 10 feet: 8 sec Gait slow and steady without use of assistive device    Cognitive Function:    10/12/2019    9:55 AM 10/09/2015   11:00 AM  MMSE - Mini Mental State Exam  Not completed: Unable to complete   Orientation to time  5  Orientation to Place  5  Registration  3  Attention/ Calculation  5  Recall  3  Language- name 2 objects  2  Language- repeat  1  Language- follow 3 step command  3  Language- read & follow direction  1  Write a sentence  1  Copy design  1  Total score  30        10/20/2023   11:55 AM 10/15/2020   10:05 AM 10/07/2018   10:05 AM 10/07/2017    1:43 PM 10/03/2016    1:41 PM  6CIT Screen  What Year? 0 points 0 points 0 points 0 points 0 points  What month? 0 points 0 points 0 points 0 points 0 points  What time? 0 points 0 points 0 points 0 points 0 points  Count back from 20 0 points  0 points 0 points 0 points  Months in reverse 0 points 0 points 0 points 0 points 0 points  Repeat phrase 2 points  0 points 0 points   Total Score 2 points  0 points 0 points     Immunizations Immunization History  Administered Date(s) Administered   Fluad Quad(high Dose 65+) 06/17/2019, 10/18/2020, 06/19/2021   Influenza Split 07/05/2012, 07/27/2013, 06/29/2014   Influenza, High Dose Seasonal PF 06/10/2017, 06/15/2018, 07/28/2022   Influenza,inj,Quad PF,6+ Mos 06/04/2016   Influenza-Unspecified 07/20/2015   PFIZER(Purple Top)SARS-COV-2 Vaccination 11/29/2019, 12/20/2019, 08/09/2020   Pneumococcal Conjugate-13 04/06/2014   Pneumococcal Polysaccharide-23 07/05/2012, 11/18/2017   Td 10/22/2021   Tdap 10/16/2011   Zoster Recombinant(Shingrix) 07/31/2021, 10/22/2021   Zoster, Live 12/19/2011    TDAP status: Up to date  Flu Vaccine status: Completed at today's visit  Pneumococcal vaccine status: Up to  date  Covid-19 vaccine status: Declined, Education has been provided regarding the importance of this vaccine but patient still declined. Advised may receive this vaccine at local pharmacy or Health Dept.or vaccine clinic. Aware to provide a copy of the vaccination record if obtained from local pharmacy or Health Dept. Verbalized acceptance and understanding.  Qualifies for Shingles Vaccine? Yes   Zostavax completed Yes   Shingrix Completed?: Yes  Screening Tests Health Maintenance  Topic Date Due   FOOT EXAM  09/18/2022   MAMMOGRAM  05/07/2023   INFLUENZA VACCINE  05/14/2023   COVID-19 Vaccine (4 - 2024-25 season) 06/14/2023   Diabetic kidney evaluation - Urine ACR  10/16/2023   Medicare Annual Wellness (AWV)  10/18/2023   HEMOGLOBIN A1C  11/04/2023   OPHTHALMOLOGY EXAM  12/10/2023   Diabetic kidney evaluation - eGFR measurement  05/03/2024   DTaP/Tdap/Td (  3 - Td or Tdap) 10/23/2031   Pneumonia Vaccine 63+ Years old  Completed   DEXA SCAN  Completed   Zoster Vaccines- Shingrix  Completed   HPV VACCINES  Aged Out   Lung Cancer Screening  Discontinued   Hepatitis C Screening  Discontinued    Health Maintenance  Health Maintenance Due  Topic Date Due   FOOT EXAM  09/18/2022   MAMMOGRAM  05/07/2023   INFLUENZA VACCINE  05/14/2023   COVID-19 Vaccine (4 - 2024-25 season) 06/14/2023   Diabetic kidney evaluation - Urine ACR  10/16/2023   Medicare Annual Wellness (AWV)  10/18/2023    Colorectal cancer screening: No longer required.   Mammogram status: No longer required due to age Discuss with PCP.  Bone Density status: Completed 10/2022. Results reflect: Bone density results: OSTEOPOROSIS. Repeat every 2 years.  Lung Cancer Screening: (Low Dose CT Chest recommended if Age 51-80 years, 20 pack-year currently smoking OR have quit w/in 15years.) does not qualify. Age, patient declines    Additional Screening:  Hepatitis C Screening: does not qualify; Completed  02/2016  Vision Screening: Recommended annual ophthalmology exams for early detection of glaucoma and other disorders of the eye. Is the patient up to date with their annual eye exam?  Yes  Who is the provider or what is the name of the office in which the patient attends annual eye exams? Dr. Laurice If pt is not established with a provider, would they like to be referred to a provider to establish care? No .   Dental Screening: Recommended annual dental exams for proper oral hygiene  Diabetic Foot Exam: Diabetic Foot Exam: Overdue, Pt has been advised about the importance in completing this exam. Pt is scheduled for diabetic foot exam on Has appointment scheduled with PCP.  Community Resource Referral / Chronic Care Management: CRR required this visit?  No   CCM required this visit?  No     Plan:     I have personally reviewed and noted the following in the patient's chart:   Medical and social history Use of alcohol, tobacco or illicit drugs  Current medications and supplements including opioid prescriptions. Patient is not currently taking opioid prescriptions. Functional ability and status Nutritional status Physical activity Advanced directives List of other physicians Hospitalizations, surgeries, and ER visits in previous 12 months Vitals Screenings to include cognitive, depression, and falls Referrals and appointments  In addition, I have reviewed and discussed with patient certain preventive protocols, quality metrics, and best practice recommendations. A written personalized care plan for preventive services as well as general preventive health recommendations were provided to patient.     Angeline Fredericks, LPN   05/14/7973   After Visit Summary: (In Person-Declined) Patient declined AVS at this time.  Nurse Notes: None

## 2023-10-20 NOTE — Telephone Encounter (Signed)
Labs were collected

## 2023-10-21 LAB — MICROALBUMIN / CREATININE URINE RATIO
Creatinine,U: 45.1 mg/dL
Microalb Creat Ratio: 1.6 mg/g (ref 0.0–30.0)
Microalb, Ur: <0.7 mg/dL (ref 0.0–1.9)

## 2023-10-27 ENCOUNTER — Ambulatory Visit (INDEPENDENT_AMBULATORY_CARE_PROVIDER_SITE_OTHER): Payer: PPO | Admitting: Internal Medicine

## 2023-10-27 ENCOUNTER — Encounter: Payer: Self-pay | Admitting: Internal Medicine

## 2023-10-27 VITALS — BP 136/56 | HR 74 | Ht 64.0 in | Wt 128.8 lb

## 2023-10-27 DIAGNOSIS — Z716 Tobacco abuse counseling: Secondary | ICD-10-CM | POA: Diagnosis not present

## 2023-10-27 DIAGNOSIS — D649 Anemia, unspecified: Secondary | ICD-10-CM | POA: Diagnosis not present

## 2023-10-27 DIAGNOSIS — D6869 Other thrombophilia: Secondary | ICD-10-CM

## 2023-10-27 DIAGNOSIS — J410 Simple chronic bronchitis: Secondary | ICD-10-CM | POA: Diagnosis not present

## 2023-10-27 DIAGNOSIS — I480 Paroxysmal atrial fibrillation: Secondary | ICD-10-CM | POA: Diagnosis not present

## 2023-10-27 DIAGNOSIS — Z1231 Encounter for screening mammogram for malignant neoplasm of breast: Secondary | ICD-10-CM

## 2023-10-27 DIAGNOSIS — E034 Atrophy of thyroid (acquired): Secondary | ICD-10-CM | POA: Diagnosis not present

## 2023-10-27 DIAGNOSIS — I700 Atherosclerosis of aorta: Secondary | ICD-10-CM | POA: Diagnosis not present

## 2023-10-27 DIAGNOSIS — E7800 Pure hypercholesterolemia, unspecified: Secondary | ICD-10-CM

## 2023-10-27 DIAGNOSIS — E1151 Type 2 diabetes mellitus with diabetic peripheral angiopathy without gangrene: Secondary | ICD-10-CM | POA: Diagnosis not present

## 2023-10-27 NOTE — Progress Notes (Signed)
 Subjective:  Patient ID: Jane Davidson, female    DOB: [birth year]  Age: 83 y.o. MRN: [id number]  CC: The primary encounter diagnosis was Encounter for screening mammogram for malignant neoplasm of breast. Diagnoses of PAF (paroxysmal atrial fibrillation) (HCC), Type 2 diabetes mellitus with diabetic peripheral angiopathy without gangrene, without long-term current use of insulin (HCC), Pure hypercholesterolemia, Acquired thrombophilia (HCC), Anemia, unspecified type, Aortic atherosclerosis (HCC), Simple chronic bronchitis (HCC), Hypothyroidism due to acquired atrophy of thyroid , and Tobacco abuse counseling were also pertinent to this visit.   HPI Jane Davidson presents for  Chief Complaint  Patient presents with   Medical Management of Chronic Issues   1) HTN:  home readings reviewed,  156/60 at home   2) type 2 DM  3) constipation: moving bowels 3-4 times per week and  taking a vegetable laxative with senna not daily .      Outpatient Medications Prior to Visit  Medication Sig Dispense Refill   amLODipine  (NORVASC ) 10 MG tablet Take 1 tablet (10 mg total) by mouth daily. 90 tablet 1   apixaban  (ELIQUIS ) 5 MG TABS tablet TAKE 1 TABLET(5 MG) BY MOUTH TWICE DAILY 60 tablet 5   atorvastatin  (LIPITOR) 20 MG tablet TAKE 1 TABLET(20 MG) BY MOUTH DAILY 90 tablet 3   Calcium  Carb-Cholecalciferol (CALCIUM  600 + D PO) Take 1 tablet by mouth daily. Calcium  600 mg and Vitamin D  800 IUs     clobetasol  ointment (TEMOVATE ) 0.05 % Apply 1 application topically 2 (two) times daily. 30 g 5   fluticasone  (FLONASE ) 50 MCG/ACT nasal spray SHAKE LIQUID AND USE 2 SPRAYS IN EACH NOSTRIL DAILY 48 g 0   fluticasone -salmeterol (WIXELA INHUB) 100-50 MCG/ACT AEPB Inhale 1 puff into the lungs 2 (two) times daily. 180 each 1   levothyroxine  (SYNTHROID ) 88 MCG tablet Take 1 tablet (88 mcg total) by mouth daily before breakfast. 90 tablet 3   loratadine  (CLARITIN ) 10 MG tablet Take 1 tablet (10 mg total) by  mouth daily. 30 tablet 11   losartan  (COZAAR ) 100 MG tablet Take 1 tablet (100 mg total) by mouth daily. 90 tablet 1   metoprolol  tartrate (LOPRESSOR ) 25 MG tablet Take 1 tablet (25 mg total) by mouth 2 (two) times daily. 60 tablet 5   nitroGLYCERIN  (NITROSTAT ) 0.4 MG SL tablet Place 1 tablet (0.4 mg total) under the tongue every 5 (five) minutes as needed for chest pain. 50 tablet 3   sertraline  (ZOLOFT ) 50 MG tablet TAKE 1 TABLET(50 MG) BY MOUTH DAILY 90 tablet 1   triamcinolone  cream (KENALOG ) 0.1 % APPLY TOPICALLY TO THE AFFECTED AREA TWICE DAILY 30 g 0   traMADol  (ULTRAM ) 50 MG tablet Take 1 tablet (50 mg total) by mouth every 12 (twelve) hours as needed for moderate pain. (Patient not taking: Reported on 10/27/2023) 60 tablet 2   No facility-administered medications prior to visit.    Review of Systems;  Patient denies headache, fevers, malaise, unintentional weight loss, skin rash, eye pain, sinus congestion and sinus pain, sore throat, dysphagia,  hemoptysis , cough, dyspnea, wheezing, chest pain, palpitations, orthopnea, edema, abdominal pain, nausea, melena, diarrhea, constipation, flank pain, dysuria, hematuria, urinary  Frequency, nocturia, numbness, tingling, seizures,  Focal weakness, Loss of consciousness,  Tremor, insomnia, depression, anxiety, and suicidal ideation.      Objective:  BP (!) 136/56   Pulse 74   Ht 5' 4 (1.626 m)   Wt 128 lb 12.8 oz (58.4 kg)   SpO2 98%  BMI 22.11 kg/m   BP Readings from Last 3 Encounters:  10/27/23 (!) 136/56  10/20/23 136/80  08/31/23 118/74    Wt Readings from Last 3 Encounters:  10/27/23 128 lb 12.8 oz (58.4 kg)  10/20/23 126 lb (57.2 kg)  08/31/23 126 lb 4.1 oz (57.3 kg)    Physical Exam Vitals reviewed.  Constitutional:      General: She is not in acute distress.    Appearance: Normal appearance. She is normal weight. She is not ill-appearing, toxic-appearing or diaphoretic.  HENT:     Head: Normocephalic.  Eyes:      General: No scleral icterus.       Right eye: No discharge.        Left eye: No discharge.     Conjunctiva/sclera: Conjunctivae normal.  Cardiovascular:     Rate and Rhythm: Normal rate and regular rhythm.     Heart sounds: Normal heart sounds.  Pulmonary:     Effort: Pulmonary effort is normal. No respiratory distress.     Breath sounds: Normal breath sounds.  Musculoskeletal:        General: Normal range of motion.  Skin:    General: Skin is warm and dry.  Neurological:     General: No focal deficit present.     Mental Status: She is alert and oriented to person, place, and time. Mental status is at baseline.  Psychiatric:        Mood and Affect: Mood normal.        Behavior: Behavior normal.        Thought Content: Thought content normal.        Judgment: Judgment normal.   Lab Results  Component Value Date   HGBA1C 6.2 10/20/2023   HGBA1C 6.0 05/04/2023   HGBA1C 6.0 10/15/2022    Lab Results  Component Value Date   CREATININE 0.76 10/20/2023   CREATININE 0.86 05/04/2023   CREATININE 0.81 11/13/2022    Lab Results  Component Value Date   WBC 7.3 10/20/2023   HGB 12.1 10/20/2023   HCT 35.3 (L) 10/20/2023   PLT 297.0 10/20/2023   GLUCOSE 118 (H) 10/20/2023   CHOL 142 10/20/2023   TRIG 63.0 10/20/2023   HDL 63.80 10/20/2023   LDLDIRECT 60.0 10/20/2023   LDLCALC 65 10/20/2023   ALT 9 10/20/2023   AST 16 10/20/2023   NA 133 (L) 10/20/2023   K 4.0 10/20/2023   CL 99 10/20/2023   CREATININE 0.76 10/20/2023   BUN 12 10/20/2023   CO2 29 10/20/2023   TSH 1.14 10/20/2023   INR 1.0 04/08/2020   HGBA1C 6.2 10/20/2023   MICROALBUR <0.7 10/20/2023    No results found.  Assessment & Plan:  .Encounter for screening mammogram for malignant neoplasm of breast -     3D Screening Mammogram, Left and Right; Future  PAF (paroxysmal atrial fibrillation) (HCC) Assessment & Plan: Clinically appears to be in sinus rhythm currently .  Continue metoprolol  and Eliquis       Type 2 diabetes mellitus with diabetic peripheral angiopathy without gangrene, without long-term current use of insulin (HCC) -     Comprehensive metabolic panel; Future -     Hemoglobin A1c; Future  Pure hypercholesterolemia -     Lipid Panel w/reflex Direct LDL; Future  Acquired thrombophilia (HCC) Assessment & Plan: secondary to atrial fibrillation.  She is tolerating use of Eliquis  for embolic stroke risk mitigation due to  atrial fibrillation. Patient has no signs of bleeding and is advised  to notify her specialists prior to any procedure that may required suspension of Eliquis      Anemia, unspecified type Assessment & Plan: Mild,  resolved by current studies   normal  iron and B12 studies  in Feb 2024.   Lab Results  Component Value Date   WBC 7.3 10/20/2023   HGB 12.1 10/20/2023   HCT 35.3 (L) 10/20/2023   MCV 90.9 10/20/2023   PLT 297.0 10/20/2023   Lab Results  Component Value Date   IRON 99 11/18/2022   TIBC 349 11/18/2022   FERRITIN 41 11/18/2022   Last vitamin B12 and Folate Lab Results  Component Value Date   VITAMINB12 339 11/18/2022   FOLATE 20.2 11/18/2022      Aortic atherosclerosis (HCC) Assessment & Plan: Reviewed findings of prior CT scan today..  Patient is tolerating  higher potency statin    Simple chronic bronchitis (HCC) Assessment & Plan: She remains  asymptomatic .  Continue Advair, Flonase  and claritin  .  encourAged to reduce cigarette use gradually   Hypothyroidism due to acquired atrophy of thyroid  Assessment & Plan: Thyroid  function is WNL on current dose.  No current changes needed.   Lab Results  Component Value Date   TSH 1.14 10/20/2023     Tobacco abuse counseling Assessment & Plan: encouraged her to reduce daily use by 1 cig per week .  She has reduced use to 1/2 pack daily but has been unable to reduce further on her own . She declines use of  pharmacotherapy     therapeutics .   Follow-up: Return in about 6  months (around 04/25/2024) for follow up diabetes.   Jane LITTIE Kettering, MD

## 2023-10-27 NOTE — Patient Instructions (Addendum)
 Your diabetes remains under excellent control  And your cholesterol and other labs are also normal.   Try Milk of Magnesium for a clean out  of your bowels  or Citrate of Magnesium: this is a liquid that works pretty quickly to clean you out so DO NOT LEAVE HOME after drinking it  Being constipated will make you gassier    Parasites do not cause constipation   Try using beano before any meal with any beans , cabbage,  Broccoli, cauliflower and brussel sprouts   Try lactaid milk,  it  may cause less GI symptoms from milk products     Think about getting the hepatitis B vaccine. The CDC now recommends that all adults get vaccinated against Hepatitis B as soon as possible.    Hepatitis B infection can cause liver failure  and other chronic and life threatening conditions and is contagious (can be spread by blood products and body fluids) .  This can be done here (if you have private insurance) or at your pharmacy (if you have Medicare/Medicaid).  it requires a 2nd dose after one month

## 2023-10-27 NOTE — Assessment & Plan Note (Signed)
 Clinically appears to be in sinus rhythm currently .  Continue metoprolol and Eliquis

## 2023-10-28 NOTE — Assessment & Plan Note (Signed)
encouraged her to reduce daily use by 1 cig per week .  She has reduced use to 1/2 pack daily but has been unable to reduce further on her own . She declines use of  pharmacotherapy

## 2023-10-28 NOTE — Assessment & Plan Note (Signed)
secondary to atrial fibrillation.  She is tolerating use of Eliquis for embolic stroke risk mitigation due to  atrial fibrillation. Patient has no signs of bleeding and is advised to notify her specialists prior to any procedure that may required suspension of Eliquis  °

## 2023-10-28 NOTE — Assessment & Plan Note (Signed)
 Mild,  resolved by current studies   normal  iron and B12 studies  in Feb 2024.   Lab Results  Component Value Date   WBC 7.3 10/20/2023   HGB 12.1 10/20/2023   HCT 35.3 (L) 10/20/2023   MCV 90.9 10/20/2023   PLT 297.0 10/20/2023   Lab Results  Component Value Date   IRON 99 11/18/2022   TIBC 349 11/18/2022   FERRITIN 41 11/18/2022   Last vitamin B12 and Folate Lab Results  Component Value Date   VITAMINB12 339 11/18/2022   FOLATE 20.2 11/18/2022

## 2023-10-28 NOTE — Assessment & Plan Note (Signed)
 Thyroid  function is WNL on current dose.  No current changes needed.   Lab Results  Component Value Date   TSH 1.14 10/20/2023

## 2023-10-28 NOTE — Assessment & Plan Note (Signed)
She remains  asymptomatic .  Continue Advair, Flonase and claritin .  encourAged to reduce cigarette use gradually

## 2023-10-28 NOTE — Assessment & Plan Note (Addendum)
 Reviewed findings of prior CT scan today..  Patient is tolerating  higher potency statin

## 2023-11-03 DIAGNOSIS — D2261 Melanocytic nevi of right upper limb, including shoulder: Secondary | ICD-10-CM | POA: Diagnosis not present

## 2023-11-03 DIAGNOSIS — Z08 Encounter for follow-up examination after completed treatment for malignant neoplasm: Secondary | ICD-10-CM | POA: Diagnosis not present

## 2023-11-03 DIAGNOSIS — Z85828 Personal history of other malignant neoplasm of skin: Secondary | ICD-10-CM | POA: Diagnosis not present

## 2023-11-03 DIAGNOSIS — C44729 Squamous cell carcinoma of skin of left lower limb, including hip: Secondary | ICD-10-CM | POA: Diagnosis not present

## 2023-11-03 DIAGNOSIS — L821 Other seborrheic keratosis: Secondary | ICD-10-CM | POA: Diagnosis not present

## 2023-11-03 DIAGNOSIS — D225 Melanocytic nevi of trunk: Secondary | ICD-10-CM | POA: Diagnosis not present

## 2023-11-03 DIAGNOSIS — L853 Xerosis cutis: Secondary | ICD-10-CM | POA: Diagnosis not present

## 2023-11-03 DIAGNOSIS — D2262 Melanocytic nevi of left upper limb, including shoulder: Secondary | ICD-10-CM | POA: Diagnosis not present

## 2023-11-08 ENCOUNTER — Other Ambulatory Visit: Payer: Self-pay | Admitting: Internal Medicine

## 2023-11-08 DIAGNOSIS — J449 Chronic obstructive pulmonary disease, unspecified: Secondary | ICD-10-CM

## 2023-11-18 ENCOUNTER — Other Ambulatory Visit: Payer: Self-pay | Admitting: Internal Medicine

## 2023-12-04 ENCOUNTER — Other Ambulatory Visit: Payer: Self-pay | Admitting: Internal Medicine

## 2023-12-04 MED ORDER — SERTRALINE HCL 50 MG PO TABS: ORAL_TABLET | ORAL | 1 refills | Status: DC

## 2023-12-04 MED ORDER — LOSARTAN POTASSIUM 100 MG PO TABS: 100.0000 mg | ORAL_TABLET | Freq: Every day | ORAL | 1 refills | Status: DC

## 2023-12-04 NOTE — Telephone Encounter (Signed)
Copied from CRM [id number]. Topic: Clinical - Medication Refill >> Dec 04, 2023  1:11 PM Sonny Dandy B wrote: Most Recent Primary Care Visit:  Provider: Sherlene Shams  Department: [facility]  Visit Type: OFFICE VISIT  Date: 10/27/2023  Medication: losartan (COZAAR) 100 MG tablet, sertraline (ZOLOFT) 50 MG tablet  Has the patient contacted their pharmacy? Yes (Agent: If no, request that the patient contact the pharmacy for the refill. If patient does not wish to contact the pharmacy document the reason why and proceed with request.) (Agent: If yes, when and what did the pharmacy advise?)  Is this the correct pharmacy for this prescription? Yes If no, delete pharmacy and type the correct one.  This is the patient's preferred pharmacy:  [facility] [address] ([address]), [place] - [address] [place]) [place] [address] Phone: [phone] Fax: [phone]     Has the prescription been filled recently? Yes  Is the patient out of the medication? Yes  Has the patient been seen for an appointment in the last year OR does the patient have an upcoming appointment? Yes  Can we respond through MyChart? Yes  Agent: Please be advised that Rx refills may take up to 3 business days. We ask that you follow-up with your pharmacy.

## 2024-01-01 DIAGNOSIS — D485 Neoplasm of uncertain behavior of skin: Secondary | ICD-10-CM | POA: Diagnosis not present

## 2024-01-01 DIAGNOSIS — C44729 Squamous cell carcinoma of skin of left lower limb, including hip: Secondary | ICD-10-CM | POA: Diagnosis not present

## 2024-01-01 DIAGNOSIS — L858 Other specified epidermal thickening: Secondary | ICD-10-CM | POA: Diagnosis not present

## 2024-01-11 ENCOUNTER — Telehealth: Payer: Self-pay

## 2024-01-11 NOTE — Telephone Encounter (Signed)
 Copied from CRM [id number]. Topic: Clinical - Lab/Test Results >> Jan 11, 2024  8:21 AM Saverio Danker wrote: Reason for CRM: Patient is calling because she needs someone to fax her lab results from Jan 14 to MD at [facility] [place], at [facility] Fax# - [phone]

## 2024-01-11 NOTE — Telephone Encounter (Signed)
 Labs have been faxed.

## 2024-01-14 ENCOUNTER — Telehealth: Payer: Self-pay | Admitting: Internal Medicine

## 2024-01-14 NOTE — Telephone Encounter (Signed)
 Dr Darrick Huntsman will not be in the office on the day of your scheduled visit, 03/28/24. Please call the office at [phone] to reschedule.   Thank you\\[id number], please reschedule this pt's 6 mo follow-up visit. [facility]

## 2024-01-27 DIAGNOSIS — H43813 Vitreous degeneration, bilateral: Secondary | ICD-10-CM | POA: Diagnosis not present

## 2024-01-27 DIAGNOSIS — E119 Type 2 diabetes mellitus without complications: Secondary | ICD-10-CM | POA: Diagnosis not present

## 2024-01-27 DIAGNOSIS — H5203 Hypermetropia, bilateral: Secondary | ICD-10-CM | POA: Diagnosis not present

## 2024-01-27 DIAGNOSIS — H52223 Regular astigmatism, bilateral: Secondary | ICD-10-CM | POA: Diagnosis not present

## 2024-01-27 DIAGNOSIS — Z961 Presence of intraocular lens: Secondary | ICD-10-CM | POA: Diagnosis not present

## 2024-01-27 DIAGNOSIS — H524 Presbyopia: Secondary | ICD-10-CM | POA: Diagnosis not present

## 2024-01-27 DIAGNOSIS — Z7984 Long term (current) use of oral hypoglycemic drugs: Secondary | ICD-10-CM | POA: Diagnosis not present

## 2024-01-27 LAB — HM DIABETES EYE EXAM

## 2024-02-13 ENCOUNTER — Other Ambulatory Visit: Payer: Self-pay | Admitting: Internal Medicine

## 2024-03-02 DIAGNOSIS — C44722 Squamous cell carcinoma of skin of right lower limb, including hip: Secondary | ICD-10-CM | POA: Diagnosis not present

## 2024-03-02 DIAGNOSIS — C44729 Squamous cell carcinoma of skin of left lower limb, including hip: Secondary | ICD-10-CM | POA: Diagnosis not present

## 2024-03-14 ENCOUNTER — Ambulatory Visit: Payer: Self-pay

## 2024-03-14 NOTE — Telephone Encounter (Signed)
 Pt daughter calling on behalf of pt. Per daughter pt is unaware that daughter is calling. No DPR. Caller requested that attempt was made to schedule d/t her working. Caller could not answer many of the triage questions and stated that "I just want to schedule her", triage not completed. PCP does not have any appts until July. Caller will be calling the pt and asking her if she is willing to see other providers in the clinic.   Copied from CRM [id number]. Topic: Clinical - Red Word Triage >> Mar 14, 2024 10:10 AM Stanly Early wrote: Red Word that prompted transfer to Nurse Triage: leg/back/hip pain Answer Assessment - Initial Assessment Questions 1. ONSET: "When did the pain start?"      She has not felt good in awhile 2. LOCATION: "Where is the pain located?"      Unsure if unilaterally  Protocols used: Leg Pain-A-AH

## 2024-03-16 NOTE — Telephone Encounter (Signed)
 Pt is scheduled to see Dr. Madelon Scheuermann on 03/29/2024.

## 2024-03-28 ENCOUNTER — Ambulatory Visit: Payer: PPO | Admitting: Internal Medicine

## 2024-03-29 ENCOUNTER — Ambulatory Visit: Admitting: Internal Medicine

## 2024-03-29 ENCOUNTER — Encounter: Payer: Self-pay | Admitting: Internal Medicine

## 2024-03-29 VITALS — BP 114/52 | HR 68 | Ht 64.0 in | Wt 130.6 lb

## 2024-03-29 DIAGNOSIS — E1151 Type 2 diabetes mellitus with diabetic peripheral angiopathy without gangrene: Secondary | ICD-10-CM | POA: Diagnosis not present

## 2024-03-29 DIAGNOSIS — J410 Simple chronic bronchitis: Secondary | ICD-10-CM | POA: Diagnosis not present

## 2024-03-29 DIAGNOSIS — R238 Other skin changes: Secondary | ICD-10-CM

## 2024-03-29 DIAGNOSIS — E871 Hypo-osmolality and hyponatremia: Secondary | ICD-10-CM

## 2024-03-29 DIAGNOSIS — E7800 Pure hypercholesterolemia, unspecified: Secondary | ICD-10-CM | POA: Diagnosis not present

## 2024-03-29 DIAGNOSIS — I10 Essential (primary) hypertension: Secondary | ICD-10-CM | POA: Diagnosis not present

## 2024-03-29 LAB — COMPREHENSIVE METABOLIC PANEL WITH GFR
ALT: 11 U/L (ref 0–35)
AST: 19 U/L (ref 0–37)
Albumin: 3.9 g/dL (ref 3.5–5.2)
Alkaline Phosphatase: 51 U/L (ref 39–117)
BUN: 15 mg/dL (ref 6–23)
CO2: 29 meq/L (ref 19–32)
Calcium: 9.1 mg/dL (ref 8.4–10.5)
Chloride: 100 meq/L (ref 96–112)
Creatinine, Ser: 0.87 mg/dL (ref 0.40–1.20)
GFR: 62.46 mL/min (ref >60.00)
Glucose, Bld: 95 mg/dL (ref 70–99)
Potassium: 4.5 meq/L (ref 3.5–5.1)
Sodium: 133 meq/L — ABNORMAL LOW (ref 135–145)
Total Bilirubin: 0.5 mg/dL (ref 0.2–1.2)
Total Protein: 6.8 g/dL (ref 6.0–8.3)

## 2024-03-29 LAB — HEMOGLOBIN A1C: Hgb A1c MFr Bld: 6.1 % (ref 4.6–6.5)

## 2024-03-29 MED ORDER — LEVOTHYROXINE SODIUM 88 MCG PO TABS: 88.0000 ug | ORAL_TABLET | Freq: Every day | ORAL | 3 refills | Status: AC

## 2024-03-29 MED ORDER — ATORVASTATIN CALCIUM 20 MG PO TABS: ORAL_TABLET | ORAL | 3 refills | Status: AC

## 2024-03-29 MED ORDER — TRIAMCINOLONE ACETONIDE 0.1 % EX CREA: TOPICAL_CREAM | Freq: Two times a day (BID) | CUTANEOUS | 0 refills | Status: DC

## 2024-03-29 NOTE — Assessment & Plan Note (Signed)
Improved control on 3 drug regimen of  amlodipine to 10 mg daily , losartan 100 mg daily and metoprolol 25 mg bid

## 2024-03-29 NOTE — Assessment & Plan Note (Signed)
 She remains  well-controlled on diet alone.  Patient is up-to-date on eye exams and foot exam has been  normal.   Patient has no proteinuria and is tolerating atorvastatin   for CAD risk reduction.    Continue use of daily aspirin  81 mg, statin  And losartan  .Encouraged to quit smoking.  Distal pulses are normal  Lab Results  Component Value Date   HGBA1C 6.1 03/29/2024   Lab Results  Component Value Date   MICROALBUR <0.7 10/20/2023

## 2024-03-29 NOTE — Assessment & Plan Note (Signed)
 She remains  asymptomatic .  Continue Advair, Flonase  and claritin  .  encouraged to reduce cigarette use gradually

## 2024-03-29 NOTE — Assessment & Plan Note (Signed)
 Now managed with  atorvastatin  , LDL is < 70 Lab Results  Component Value Date   CHOL 142 10/20/2023   HDL 63.80 10/20/2023   LDLCALC 65 10/20/2023   LDLDIRECT 60.0 10/20/2023   TRIG 63.0 10/20/2023   CHOLHDL 2 10/20/2023

## 2024-03-29 NOTE — Patient Instructions (Addendum)
 Ok to use 1000 mg tylenol  at bedtime for the arthritis pain you are having in your buttocks   Stay active.  Walk daily for exercise.    Send me 4 or 5 readings of Blood pressure and pulse and send them to me once a month   I have refilled the lower potency steroid ointment for the itchy patches on your legs.

## 2024-03-29 NOTE — Progress Notes (Signed)
 Subjective:  Patient ID: Jane Davidson, female    DOB: 1941-01-04  Age: 83 y.o. MRN: [id number]  CC: The primary encounter diagnosis was Simple chronic bronchitis (HCC). Diagnoses of Pure hypercholesterolemia, Skin irritation, Type 2 diabetes mellitus with diabetic peripheral angiopathy without gangrene, without long-term current use of insulin (HCC), Essential hypertension, and Hyponatremia were also pertinent to this visit.   HPI Jane Davidson presents for  Chief Complaint  Patient presents with   Medical Management of Chronic Issues    6 month follow up    1) HTN:  checking BP at home occasionally when she feels light headed .  Readings have been between 130 and 140/80-90  2) COPD;  has an occasional  cough cry in the evenings before bed.  Uses wixela prn .  Still smoking ,  trying to reduce daily consumption.   Currently 1/2 pack daily     3)  PAF: seen by cardiology in Jan 2025 .  No changes  on eliquis  and metoprolol .   4) atopic  dermatitis : given triamcinolone  1% by cardiology for leg patches  n January.  Under the care of [facility].  Being treated for skin ca by isenstiein with an injection into 2 spots on left leg.  Has follow up in July  5) cc;  bilateral buttocks and posterior thigh upon waking .  Improves  after a few minutes of  activity    Outpatient Medications Prior to Visit  Medication Sig Dispense Refill   amLODipine  (NORVASC ) 10 MG tablet Take 1 tablet by mouth once daily 90 tablet 1   apixaban  (ELIQUIS ) 5 MG TABS tablet Take 1 tablet by mouth twice daily 180 tablet 1   Calcium  Carb-Cholecalciferol (CALCIUM  600 + D PO) Take 1 tablet by mouth daily. Calcium  600 mg and Vitamin D  800 IUs     clobetasol  ointment (TEMOVATE ) 0.05 % Apply 1 application topically 2 (two) times daily. 30 g 5   fluticasone  (FLONASE ) 50 MCG/ACT nasal spray SHAKE LIQUID AND USE 2 SPRAYS IN EACH NOSTRIL DAILY 48 g 0   losartan  (COZAAR ) 100 MG tablet Take 1 tablet (100 mg total) by  mouth daily. 90 tablet 1   metoprolol  tartrate (LOPRESSOR ) 25 MG tablet Take 1 tablet by mouth twice daily 180 tablet 1   nitroGLYCERIN  (NITROSTAT ) 0.4 MG SL tablet Place 1 tablet (0.4 mg total) under the tongue every 5 (five) minutes as needed for chest pain. 50 tablet 3   sertraline  (ZOLOFT ) 50 MG tablet TAKE 1 TABLET(50 MG) BY MOUTH DAILY 90 tablet 1   traMADol  (ULTRAM ) 50 MG tablet Take 1 tablet (50 mg total) by mouth every 12 (twelve) hours as needed for moderate pain. 60 tablet 2   WIXELA INHUB 100-50 MCG/ACT AEPB INHALE 1 PUFF INTO THE LUNGS TWICE DAILY 180 each 1   atorvastatin  (LIPITOR) 20 MG tablet TAKE 1 TABLET(20 MG) BY MOUTH DAILY 90 tablet 3   levothyroxine  (SYNTHROID ) 88 MCG tablet Take 1 tablet (88 mcg total) by mouth daily before breakfast. 90 tablet 3   triamcinolone  cream (KENALOG ) 0.1 % APPLY TOPICALLY TO THE AFFECTED AREA TWICE DAILY 30 g 0   loratadine  (CLARITIN ) 10 MG tablet Take 1 tablet (10 mg total) by mouth daily. 30 tablet 11   No facility-administered medications prior to visit.    Review of Systems;  Patient denies headache, fevers, malaise, unintentional weight loss, skin rash, eye pain, sinus congestion and sinus pain, sore throat, dysphagia,  hemoptysis , cough,  dyspnea, wheezing, chest pain, palpitations, orthopnea, edema, abdominal pain, nausea, melena, diarrhea, constipation, flank pain, dysuria, hematuria, urinary  Frequency, nocturia, numbness, tingling, seizures,  Focal weakness, Loss of consciousness,  Tremor, insomnia, depression, anxiety, and suicidal ideation.      Objective:  BP (!) 114/52   Pulse 68   Ht 5' 4 (1.626 m)   Wt 130 lb 9.6 oz (59.2 kg)   SpO2 99%   BMI 22.42 kg/m   BP Readings from Last 3 Encounters:  03/29/24 (!) 114/52  10/27/23 (!) 136/56  10/20/23 136/80    Wt Readings from Last 3 Encounters:  03/29/24 130 lb 9.6 oz (59.2 kg)  10/27/23 128 lb 12.8 oz (58.4 kg)  10/20/23 126 lb (57.2 kg)    Physical Exam Vitals  reviewed.  Constitutional:      General: She is not in acute distress.    Appearance: Normal appearance. She is normal weight. She is not ill-appearing, toxic-appearing or diaphoretic.  HENT:     Head: Normocephalic.   Eyes:     General: No scleral icterus.       Right eye: No discharge.        Left eye: No discharge.     Conjunctiva/sclera: Conjunctivae normal.    Cardiovascular:     Rate and Rhythm: Normal rate and regular rhythm.     Heart sounds: Normal heart sounds.  Pulmonary:     Effort: Pulmonary effort is normal. No respiratory distress.     Breath sounds: Normal breath sounds.   Musculoskeletal:        General: Normal range of motion.   Skin:    General: Skin is warm and dry.   Neurological:     General: No focal deficit present.     Mental Status: She is alert and oriented to person, place, and time. Mental status is at baseline.   Psychiatric:        Mood and Affect: Mood normal.        Behavior: Behavior normal.        Thought Content: Thought content normal.        Judgment: Judgment normal.    Lab Results  Component Value Date   HGBA1C 6.1 03/29/2024   HGBA1C 6.2 10/20/2023   HGBA1C 6.0 05/04/2023    Lab Results  Component Value Date   CREATININE 0.87 03/29/2024   CREATININE 0.76 10/20/2023   CREATININE 0.86 05/04/2023    Lab Results  Component Value Date   WBC 7.3 10/20/2023   HGB 12.1 10/20/2023   HCT 35.3 (L) 10/20/2023   PLT 297.0 10/20/2023   GLUCOSE 95 03/29/2024   CHOL 125 03/29/2024   TRIG 78 03/29/2024   HDL 61 03/29/2024   LDLDIRECT 60.0 10/20/2023   LDLCALC 48 03/29/2024   ALT 11 03/29/2024   AST 19 03/29/2024   NA 133 (L) 03/29/2024   K 4.5 03/29/2024   CL 100 03/29/2024   CREATININE 0.87 03/29/2024   BUN 15 03/29/2024   CO2 29 03/29/2024   TSH 1.14 10/20/2023   INR 1.0 04/08/2020   HGBA1C 6.1 03/29/2024   MICROALBUR <0.7 10/20/2023    No results found.  Assessment & Plan:  .Simple chronic bronchitis  (HCC) Assessment & Plan: She remains  asymptomatic .  Continue Advair, Flonase  and claritin  .  encouraged to reduce cigarette use gradually   Pure hypercholesterolemia Assessment & Plan: Now managed with  atorvastatin  , LDL is < 70 Lab Results  Component Value Date  CHOL 142 10/20/2023   HDL 63.80 10/20/2023   LDLCALC 65 10/20/2023   LDLDIRECT 60.0 10/20/2023   TRIG 63.0 10/20/2023   CHOLHDL 2 10/20/2023     Orders: -     Atorvastatin  Calcium ; TAKE 1 TABLET(20 MG) BY MOUTH DAILY  Dispense: 90 tablet; Refill: 3 -     Lipid Panel w/reflex Direct LDL  Skin irritation -     Triamcinolone  Acetonide; Apply topically 2 (two) times daily.  Dispense: 90 g; Refill: 0  Type 2 diabetes mellitus with diabetic peripheral angiopathy without gangrene, without long-term current use of insulin (HCC) Assessment & Plan:  She remains  well-controlled on diet alone.  Patient is up-to-date on eye exams and foot exam has been  normal.   Patient has no proteinuria and is tolerating atorvastatin   for CAD risk reduction.    Continue use of daily aspirin  81 mg, statin  And losartan  .Encouraged to quit smoking.  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   Improved with  d/c hctz  Lab Results  Component Value Date   NA 133 (L) 03/29/2024   K 4.5 03/29/2024   CL 100 03/29/2024   CO2 29 03/29/2024      Other orders -     Levothyroxine  Sodium; Take 1 tablet (88 mcg total) by mouth daily before breakfast.  Dispense: 90 tablet; Refill:  3     I spent 34 minutes on the day of this face to face encounter reviewing patient's  most recent visit with cardiology,   prior relevant surgical and non surgical procedures, recent  labs and imaging studies, counseling on weight management,  reviewing the assessment and plan with patient, and post visit ordering and reviewing of  diagnostics and therapeutics with patient  .   Follow-up: No follow-ups on file.   Thersia Flax, MD

## 2024-03-30 LAB — LIPID PANEL W/REFLEX DIRECT LDL
Cholesterol: 125 mg/dL (ref <200)
HDL: 61 mg/dL (ref >=50)
LDL Cholesterol (Calc): 48 mg/dL
Non-HDL Cholesterol (Calc): 64 mg/dL (ref <130)
Total CHOL/HDL Ratio: 2 (calc) (ref <5.0)
Triglycerides: 78 mg/dL (ref <150)

## 2024-03-30 NOTE — Assessment & Plan Note (Signed)
 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   Improved with  d/c hctz  Lab Results  Component Value Date   NA 133 (L) 03/29/2024   K 4.5 03/29/2024   CL 100 03/29/2024   CO2 29 03/29/2024

## 2024-03-31 ENCOUNTER — Ambulatory Visit: Payer: Self-pay | Admitting: Internal Medicine

## 2024-04-13 DIAGNOSIS — C44729 Squamous cell carcinoma of skin of left lower limb, including hip: Secondary | ICD-10-CM | POA: Diagnosis not present

## 2024-04-25 DIAGNOSIS — H3562 Retinal hemorrhage, left eye: Secondary | ICD-10-CM | POA: Diagnosis not present

## 2024-04-25 DIAGNOSIS — H43813 Vitreous degeneration, bilateral: Secondary | ICD-10-CM | POA: Diagnosis not present

## 2024-04-25 DIAGNOSIS — Z961 Presence of intraocular lens: Secondary | ICD-10-CM | POA: Diagnosis not present

## 2024-04-25 DIAGNOSIS — E119 Type 2 diabetes mellitus without complications: Secondary | ICD-10-CM | POA: Diagnosis not present

## 2024-04-25 DIAGNOSIS — Z7984 Long term (current) use of oral hypoglycemic drugs: Secondary | ICD-10-CM | POA: Diagnosis not present

## 2024-04-26 DIAGNOSIS — I6523 Occlusion and stenosis of bilateral carotid arteries: Secondary | ICD-10-CM | POA: Diagnosis not present

## 2024-04-26 DIAGNOSIS — I480 Paroxysmal atrial fibrillation: Secondary | ICD-10-CM | POA: Diagnosis not present

## 2024-04-26 DIAGNOSIS — E1151 Type 2 diabetes mellitus with diabetic peripheral angiopathy without gangrene: Secondary | ICD-10-CM | POA: Diagnosis not present

## 2024-04-26 DIAGNOSIS — R600 Localized edema: Secondary | ICD-10-CM | POA: Diagnosis not present

## 2024-04-26 DIAGNOSIS — J449 Chronic obstructive pulmonary disease, unspecified: Secondary | ICD-10-CM | POA: Diagnosis not present

## 2024-04-26 DIAGNOSIS — E785 Hyperlipidemia, unspecified: Secondary | ICD-10-CM | POA: Diagnosis not present

## 2024-04-26 DIAGNOSIS — I10 Essential (primary) hypertension: Secondary | ICD-10-CM | POA: Diagnosis not present

## 2024-04-26 DIAGNOSIS — Z720 Tobacco use: Secondary | ICD-10-CM | POA: Diagnosis not present

## 2024-04-26 DIAGNOSIS — Z8679 Personal history of other diseases of the circulatory system: Secondary | ICD-10-CM | POA: Diagnosis not present

## 2024-05-02 DIAGNOSIS — I480 Paroxysmal atrial fibrillation: Secondary | ICD-10-CM | POA: Diagnosis not present

## 2024-05-05 ENCOUNTER — Other Ambulatory Visit: Payer: Self-pay | Admitting: Internal Medicine

## 2024-05-05 DIAGNOSIS — D2262 Melanocytic nevi of left upper limb, including shoulder: Secondary | ICD-10-CM | POA: Diagnosis not present

## 2024-05-05 DIAGNOSIS — D225 Melanocytic nevi of trunk: Secondary | ICD-10-CM | POA: Diagnosis not present

## 2024-05-05 DIAGNOSIS — D2261 Melanocytic nevi of right upper limb, including shoulder: Secondary | ICD-10-CM | POA: Diagnosis not present

## 2024-05-05 DIAGNOSIS — D485 Neoplasm of uncertain behavior of skin: Secondary | ICD-10-CM | POA: Diagnosis not present

## 2024-05-05 DIAGNOSIS — L821 Other seborrheic keratosis: Secondary | ICD-10-CM | POA: Diagnosis not present

## 2024-05-05 DIAGNOSIS — C44729 Squamous cell carcinoma of skin of left lower limb, including hip: Secondary | ICD-10-CM | POA: Diagnosis not present

## 2024-05-05 DIAGNOSIS — L853 Xerosis cutis: Secondary | ICD-10-CM | POA: Diagnosis not present

## 2024-05-05 DIAGNOSIS — Z85828 Personal history of other malignant neoplasm of skin: Secondary | ICD-10-CM | POA: Diagnosis not present

## 2024-05-05 DIAGNOSIS — C44722 Squamous cell carcinoma of skin of right lower limb, including hip: Secondary | ICD-10-CM | POA: Diagnosis not present

## 2024-05-14 ENCOUNTER — Other Ambulatory Visit: Payer: Self-pay | Admitting: Internal Medicine

## 2024-05-14 DIAGNOSIS — R238 Other skin changes: Secondary | ICD-10-CM

## 2024-06-06 ENCOUNTER — Other Ambulatory Visit: Payer: Self-pay | Admitting: Internal Medicine

## 2024-06-06 NOTE — Telephone Encounter (Unsigned)
 Copied from CRM #[id number]. Topic: Clinical - Medication Refill >> Jun 06, 2024  9:49 AM Turkey A wrote: Medication: losartan  (COZAAR ) 100 MG tablet  Has the patient contacted their pharmacy? No (Agent: If no, request that the patient contact the pharmacy for the refill. If patient does not wish to contact the pharmacy document the reason why and proceed with request.) (Agent: If yes, when and what did the pharmacy advise?)  This is the patient's preferred pharmacy:  [facility] [address] ([address]), [place] - [address] [place]) [place] [address] Phone: [phone] Fax: [phone]   Is this the correct pharmacy for this prescription? Yes If no, delete pharmacy and type the correct one.   Has the prescription been filled recently? No  Is the patient out of the medication? No  Has the patient been seen for an appointment in the last year OR does the patient have an upcoming appointment? Yes  Can we respond through MyChart? No  Agent: Please be advised that Rx refills may take up to 3 business days. We ask that you follow-up with your pharmacy.

## 2024-06-07 MED ORDER — LOSARTAN POTASSIUM 100 MG PO TABS: 100.0000 mg | ORAL_TABLET | Freq: Every day | ORAL | 1 refills | Status: AC

## 2024-06-09 ENCOUNTER — Other Ambulatory Visit: Payer: Self-pay | Admitting: Internal Medicine

## 2024-06-09 DIAGNOSIS — R238 Other skin changes: Secondary | ICD-10-CM

## 2024-06-10 NOTE — Telephone Encounter (Signed)
 Spoke with pt and she stated that she does not need this refilled at this time.

## 2024-06-11 ENCOUNTER — Other Ambulatory Visit: Payer: Self-pay | Admitting: Internal Medicine

## 2024-06-11 DIAGNOSIS — R238 Other skin changes: Secondary | ICD-10-CM

## 2024-06-14 NOTE — Telephone Encounter (Signed)
 Pt stated that she does not need a refill on this medication at this time.

## 2024-07-01 DIAGNOSIS — C44722 Squamous cell carcinoma of skin of right lower limb, including hip: Secondary | ICD-10-CM | POA: Diagnosis not present

## 2024-07-01 DIAGNOSIS — R208 Other disturbances of skin sensation: Secondary | ICD-10-CM | POA: Diagnosis not present

## 2024-07-01 DIAGNOSIS — D485 Neoplasm of uncertain behavior of skin: Secondary | ICD-10-CM | POA: Diagnosis not present

## 2024-07-01 DIAGNOSIS — C44729 Squamous cell carcinoma of skin of left lower limb, including hip: Secondary | ICD-10-CM | POA: Diagnosis not present

## 2024-07-08 DIAGNOSIS — C44729 Squamous cell carcinoma of skin of left lower limb, including hip: Secondary | ICD-10-CM | POA: Diagnosis not present

## 2024-07-08 DIAGNOSIS — C44722 Squamous cell carcinoma of skin of right lower limb, including hip: Secondary | ICD-10-CM | POA: Diagnosis not present

## 2024-07-29 ENCOUNTER — Other Ambulatory Visit: Payer: Self-pay | Admitting: Internal Medicine

## 2024-08-15 ENCOUNTER — Telehealth: Payer: Self-pay

## 2024-08-15 NOTE — Telephone Encounter (Signed)
 Spoke with pt to let her know that I did speak with the pharmacy and they do not have a recall on her losartan . Pt stated that she heard it on the phone. I explained to her that if her medication was recalled she would have been notified by her pharmacy. Pt gave a verbal understanding.

## 2024-08-15 NOTE — Telephone Encounter (Signed)
 Copied from CRM [id number]. Topic: Clinical - Medication Question >> Aug 15, 2024  9:59 AM Jane Davidson wrote: Reason for CRM: Patient states her medication losartan  (COZAAR ) 100 MG tablet has been recalled and patient needs to know what she can be put on in its place, Patient request a return call to discuss  the options as well.

## 2024-08-16 ENCOUNTER — Other Ambulatory Visit: Payer: Self-pay | Admitting: Internal Medicine

## 2024-08-19 DIAGNOSIS — C44729 Squamous cell carcinoma of skin of left lower limb, including hip: Secondary | ICD-10-CM | POA: Diagnosis not present

## 2024-08-19 DIAGNOSIS — C44722 Squamous cell carcinoma of skin of right lower limb, including hip: Secondary | ICD-10-CM | POA: Diagnosis not present

## 2024-08-22 ENCOUNTER — Other Ambulatory Visit: Payer: Self-pay | Admitting: Internal Medicine

## 2024-08-22 NOTE — Telephone Encounter (Unsigned)
 Copied from CRM #[id number]. Topic: Clinical - Medication Refill >> Aug 22, 2024  9:35 AM Hadassah PARAS wrote: Medication: sertraline  (ZOLOFT ) 50 MG tablet  Has the patient contacted their pharmacy? Yes (Agent: If no, request that the patient contact the pharmacy for the refill. If patient does not wish to contact the pharmacy document the reason why and proceed with request.) (Agent: If yes, when and what did the pharmacy advise?)  This is the patient's preferred pharmacy:  [facility] [address] ([address]),  - [address] [place]) [place] [address] Phone: [phone] Fax: [phone]    Is this the correct pharmacy for this prescription? Yes If no, delete pharmacy and type the correct one.   Has the prescription been filled recently? No  Is the patient out of the medication? No  Has the patient been seen for an appointment in the last year OR does the patient have an upcoming appointment? Yes  Can we respond through MyChart? Yes  Agent: Please be advised that Rx refills may take up to 3 business days. We ask that you follow-up with your pharmacy.

## 2024-08-24 MED ORDER — SERTRALINE HCL 50 MG PO TABS: ORAL_TABLET | ORAL | 1 refills | Status: AC

## 2024-10-21 ENCOUNTER — Ambulatory Visit (INDEPENDENT_AMBULATORY_CARE_PROVIDER_SITE_OTHER): Payer: PPO

## 2024-10-21 DIAGNOSIS — Z0000 Encounter for general adult medical examination without abnormal findings: Secondary | ICD-10-CM | POA: Diagnosis not present

## 2024-10-21 MED ORDER — FLUTICASONE PROPIONATE 50 MCG/ACT NA SUSP: NASAL | 0 refills | Status: AC

## 2024-10-21 NOTE — Progress Notes (Signed)
 "  Chief Complaint  Patient presents with   Medicare Wellness     Subjective:   Jane Davidson is a 84 y.o. female who presents for a Medicare Annual Wellness Visit.  Visit info / Clinical Intake: Medicare Wellness Visit Type:: Subsequent Annual Wellness Visit Persons participating in visit and providing information:: patient Medicare Wellness Visit Mode:: Telephone If telephone:: video declined Since this visit was completed virtually, some vitals may be partially provided or unavailable. Missing vitals are due to the limitations of the virtual format.: Unable to obtain vitals - no equipment If Telephone or Video please confirm:: I connected with patient using audio/video enable telemedicine. I verified patient identity with two identifiers, discussed telehealth limitations, and patient agreed to proceed. Patient Location:: Home Provider Location:: Home Interpreter Needed?: No Pre-visit prep was completed: yes AWV questionnaire completed by patient prior to visit?: no Living arrangements:: (!) lives alone Patient's Overall Health Status Rating: good Typical amount of pain: some (lower back) Does pain affect daily life?: no Are you currently prescribed opioids?: no  Dietary Habits and Nutritional Risks How many meals a day?: 3 Eats fruit and vegetables daily?: yes Most meals are obtained by: preparing own meals In the last 2 weeks, have you had any of the following?: none Diabetic:: (!) yes Any non-healing wounds?: no (Patient has eczema) How often do you check your BS?: 0 Would you like to be referred to a Nutritionist or for Diabetic Management? : no  Functional Status Activities of Daily Living (to include ambulation/medication): Independent Ambulation: Independent Medication Administration: Independent Home Management (perform basic housework or laundry): Independent Manage your own finances?: yes Primary transportation is: driving Concerns about vision?: no *vision  screening is required for WTM* Concerns about hearing?: no  Fall Screening Falls in the past year?: 1 Number of falls in past year: 0 Was there an injury with Fall?: 0 Fall Risk Category Calculator: 1 Patient Fall Risk Level: Low Fall Risk  Fall Risk Patient at Risk for Falls Due to: History of fall(s) Fall risk Follow up: Falls evaluation completed  Home and Transportation Safety: All rugs have non-skid backing?: yes All stairs or steps have railings?: N/A, no stairs Grab bars in the bathtub or shower?: yes Have non-skid surface in bathtub or shower?: yes Good home lighting?: yes Regular seat belt use?: yes  Cognitive Assessment Difficulty concentrating, remembering, or making decisions? : yes Will 6CIT or Mini Cog be Completed: yes What year is it?: 0 points What month is it?: 0 points About what time is it?: 0 points Count backwards from 20 to 1: 0 points Say the months of the year in reverse: 0 points Repeat the address phrase from earlier: 6 points 6 CIT Score: 6 points  Advance Directives (For Healthcare) Does Patient Have a Medical Advance Directive?: Yes Does patient want to make changes to medical advance directive?: No - Patient declined Type of Advance Directive: Healthcare Power of [place]; Living will Copy of Healthcare Power of Attorney in Chart?: No - copy requested Copy of Living Will in Chart?: No - copy requested  Reviewed/Updated  Reviewed/Updated: Reviewed All (Medical, Surgical, Family, Medications, Allergies, Care Teams, Patient Goals)    Allergies (verified) Bee pollen, Pollen extract, Hctz [hydrochlorothiazide ], and Other   Current Medications (verified) Outpatient Encounter Medications as of 10/21/2024  Medication Sig   amLODipine  (NORVASC ) 10 MG tablet Take 1 tablet by mouth once daily   apixaban  (ELIQUIS ) 5 MG TABS tablet Take 1 tablet by mouth twice daily  atorvastatin  (LIPITOR) 20 MG tablet TAKE 1 TABLET(20 MG) BY MOUTH DAILY    Calcium  Carb-Cholecalciferol (CALCIUM  600 + D PO) Take 1 tablet by mouth daily. Calcium  600 mg and Vitamin D  800 IUs   fluticasone  (FLONASE ) 50 MCG/ACT nasal spray SHAKE LIQUID AND USE 2 SPRAYS IN EACH NOSTRIL DAILY   levothyroxine  (SYNTHROID ) 88 MCG tablet Take 1 tablet (88 mcg total) by mouth daily before breakfast.   losartan  (COZAAR ) 100 MG tablet Take 1 tablet (100 mg total) by mouth daily.   MAGNESIUM PO Take by mouth.   metoprolol  tartrate (LOPRESSOR ) 25 MG tablet Take 1 tablet by mouth twice daily   nitroGLYCERIN  (NITROSTAT ) 0.4 MG SL tablet Place 1 tablet (0.4 mg total) under the tongue every 5 (five) minutes as needed for chest pain.   Nutritional Supplements (COLD AND FLU PO) Take by mouth.   sertraline  (ZOLOFT ) 50 MG tablet TAKE 1 TABLET(50 MG) BY MOUTH DAILY   traMADol  (ULTRAM ) 50 MG tablet Take 1 tablet (50 mg total) by mouth every 12 (twelve) hours as needed for moderate pain.   triamcinolone  cream (KENALOG ) 0.1 % APPLY  CREAM EXTERNALLY TWICE DAILY   WIXELA INHUB 100-50 MCG/ACT AEPB INHALE 1 PUFF INTO THE LUNGS TWICE DAILY   clobetasol  ointment (TEMOVATE ) 0.05 % Apply 1 application topically 2 (two) times daily. (Patient not taking: Reported on 10/21/2024)   No facility-administered encounter medications on file as of 10/21/2024.    History: Past Medical History:  Diagnosis Date   Abnormal Pap smear of cervix 2001   Biopsy normal   Bursitis NEC    Cancer (HCC)    skin   COPD (chronic obstructive pulmonary disease) (HCC)    Gastritis, Helicobacter pylori 06/20/2021   History of peripheral vascular disease 2001   s/p bilateral aorto-iliac-femoral bypass /[facility]   Hyperlipidemia    hypothyroid    Osteopenia 2009   T scores - 1.5 DEXA 2009   Screening for breast cancer 02/2011   [facility] mammogram   Tobacco abuse disorder    Vitamin D  deficiency 01/2010   replaced withDrsido for level of 11.3 ng/ml   Vitamin D  deficiency 01/2010   replaced withDrsido for level of 11.3 ng/ml    Past Surgical History:  Procedure Laterality Date   BREAST BIOPSY Left    benign   CAROTID ENDARTERECTOMY Right 2016   Selinda Gu   CATARACT EXTRACTION  Sept 2011   CHOLECYSTECTOMY     COLONOSCOPY WITH PROPOFOL  N/A 07/16/2023   Procedure: COLONOSCOPY WITH PROPOFOL ;  Surgeon: Therisa Bi, MD;  Location: [facility] ENDOSCOPY;  Service: Gastroenterology;  Laterality: N/A;   COLONOSCOPY WITH PROPOFOL  N/A 08/31/2023   Procedure: COLONOSCOPY WITH PROPOFOL ;  Surgeon: Therisa Bi, MD;  Location: [facility] ENDOSCOPY;  Service: Gastroenterology;  Laterality: N/A;   HERNIA REPAIR  Jun 2011   [facility]    TUBAL LIGATION     Family History  Problem Relation Age of Onset   Hypertension Mother    Arthritis Mother    Diabetes Father    Dementia Sister    Breast cancer Neg Hx    Social History   Occupational History   Not on file  Tobacco Use   Smoking status: Every Day    Current packs/day: 1.04    Average packs/day: 1 pack/day for 40.0 years (41.6 ttl pk-yrs)    Types: Cigarettes   Smokeless tobacco: Never  Vaping Use   Vaping status: Never Used  Substance and Sexual Activity   Alcohol use: No  Drug use: No   Sexual activity: Never   Tobacco Counseling Ready to quit: Not Answered Counseling given: Not Answered  SDOH Screenings   Food Insecurity: No Food Insecurity (10/21/2024)  Housing: Low Risk (10/21/2024)  Transportation Needs: No Transportation Needs (10/21/2024)  Utilities: Not At Risk (10/21/2024)  Alcohol Screen: Low Risk (10/20/2023)  Depression (PHQ2-9): Low Risk (10/21/2024)  Financial Resource Strain: Low Risk (10/20/2023)  Physical Activity: Insufficiently Active (10/21/2024)  Social Connections: Moderately Isolated (10/21/2024)  Stress: Stress Concern Present (10/21/2024)  Tobacco Use: High Risk (03/29/2024)  Health Literacy: Adequate Health Literacy (10/21/2024)   See flowsheets for full screening details  Depression Screen PHQ 2 & 9 Depression Scale- Over the past 2 weeks, how  often have you been bothered by any of the following problems? Little interest or pleasure in doing things: 0 Feeling down, depressed, or hopeless (PHQ Adolescent also includes...irritable): 1 PHQ-2 Total Score: 1 Trouble falling or staying asleep, or sleeping too much: 0 Feeling tired or having little energy: 1 Poor appetite or overeating (PHQ Adolescent also includes...weight loss): 0 Feeling bad about yourself - or that you are a failure or have let yourself or your family down: 0 Trouble concentrating on things, such as reading the newspaper or watching television (PHQ Adolescent also includes...like school work): 0 Moving or speaking so slowly that other people could have noticed. Or the opposite - being so fidgety or restless that you have been moving around a lot more than usual: 0 Thoughts that you would be better off dead, or of hurting yourself in some way: 0 PHQ-9 Total Score: 1 If you checked off any problems, how difficult have these problems made it for you to do your work, take care of things at home, or get along with other people?: Not difficult at all     Goals Addressed             This Visit's Progress    Trying to quit smoking. Has cut back.               Objective:    There were no vitals filed for this visit. There is no height or weight on file to calculate BMI.  Hearing/Vision screen No results found. Immunizations and Health Maintenance Health Maintenance  Topic Date Due   Diabetic kidney evaluation - Urine ACR  Never done   FOOT EXAM  09/18/2022   Influenza Vaccine  05/13/2024   COVID-19 Vaccine (4 - 2025-26 season) 06/13/2024   Medicare Annual Wellness (AWV)  10/19/2024   HEMOGLOBIN A1C  09/28/2024   Mammogram  03/29/2025 (Originally 05/07/2023)   Diabetic kidney evaluation - eGFR measurement  03/29/2025   OPHTHALMOLOGY EXAM  04/25/2025   DTaP/Tdap/Td (5 - Td or Tdap) 10/23/2031   Pneumococcal Vaccine: 50+ Years  Completed   Bone Density  Scan  Completed   Zoster Vaccines- Shingrix  Completed   Meningococcal B Vaccine  Aged Out   Lung Cancer Screening  Discontinued   Hepatitis C Screening  Discontinued        Assessment/Plan:  This is a routine wellness examination for Denora.  Patient Care Team: Marylynn Verneita CROME, MD as PCP - General (Internal Medicine)  I have personally reviewed and noted the following in the patients chart:   Medical and social history Use of alcohol, tobacco or illicit drugs  Current medications and supplements including opioid prescriptions. Functional ability and status Nutritional status Physical activity Advanced directives List of other physicians Hospitalizations, surgeries, and ER  visits in previous 12 months Vitals Screenings to include cognitive, depression, and falls Referrals and appointments  No orders of the defined types were placed in this encounter.  In addition, I have reviewed and discussed with patient certain preventive protocols, quality metrics, and best practice recommendations. A written personalized care plan for preventive services as well as general preventive health recommendations were provided to patient.   Arnette LOISE Hoots, CMA   10/21/2024   No follow-ups on file.  After Visit Summary: (Declined) Due to this being a telephonic visit, with patients personalized plan was offered to patient but patient Declined AVS at this time   Nurse Notes: Patient is doing well. She states that she did fall the other day. She states that she took a nap and upon waking she tried to walk while legs were asleep and fell on her knee. She states that there is a bruise on her neck she must have hit it but no pain.  "

## 2024-10-21 NOTE — Patient Instructions (Signed)
 Jane Davidson,  Thank you for taking the time for your Medicare Wellness Visit. I appreciate your continued commitment to your health goals. Please review the care plan we discussed, and feel free to reach out if I can assist you further.  Please note that Annual Wellness Visits do not include a physical exam. Some assessments may be limited, especially if the visit was conducted virtually. If needed, we may recommend an in-person follow-up with your provider.  Ongoing Care Seeing your primary care provider every 3 to 6 months helps us  monitor your health and provide consistent, personalized care. 3/2 @ 930  Referrals If a referral was made during today's visit and you haven't received any updates within two weeks, please contact the referred provider directly to check on the status.  Recommended Screenings:  Health Maintenance  Topic Date Due   Yearly kidney health urinalysis for diabetes  Never done   Complete foot exam   09/18/2022   Flu Shot  05/13/2024   COVID-19 Vaccine (4 - 2025-26 season) 06/13/2024   Medicare Annual Wellness Visit  10/19/2024   Hemoglobin A1C  09/28/2024   Breast Cancer Screening  03/29/2025*   Yearly kidney function blood test for diabetes  03/29/2025   Eye exam for diabetics  04/25/2025   DTaP/Tdap/Td vaccine (5 - Td or Tdap) 10/23/2031   Pneumococcal Vaccine for age over 14  Completed   Osteoporosis screening with Bone Density Scan  Completed   Zoster (Shingles) Vaccine  Completed   Meningitis B Vaccine  Aged Out   Screening for Lung Cancer  Discontinued   Hepatitis C Screening  Discontinued  *Topic was postponed. The date shown is not the original due date.       10/21/2024    9:39 AM  Advanced Directives  Does Patient Have a Medical Advance Directive? Yes  Type of Estate Agent of [place];Living will  Does patient want to make changes to medical advance directive? No - Patient declined  Copy of Healthcare Power of Attorney in  Chart? No - copy requested    Vision: Annual vision screenings are recommended for early detection of glaucoma, cataracts, and diabetic retinopathy. These exams can also reveal signs of chronic conditions such as diabetes and high blood pressure.  Dental: Annual dental screenings help detect early signs of oral cancer, gum disease, and other conditions linked to overall health, including heart disease and diabetes.  Please see the attached documents for additional preventive care recommendations.

## 2024-10-26 ENCOUNTER — Other Ambulatory Visit: Payer: Self-pay | Admitting: Internal Medicine

## 2024-11-01 NOTE — Progress Notes (Addendum)
 Established Patient Visit   Chief Complaint: Chief Complaint  Patient presents with   Follow-up    6 months    Date of Service: 11/01/2024 Date of Birth: [birth year] PCP: Marylynn Verneita Kay, MD [address] [place] [address]  History of Present Illness:   Jane Davidson is a 84 y.o.female patient that presents for 6 month f/u.   PMH: Paroxysmal atrial fibrillation In June 2021, the patient presented to [facility] ER for a tick bite, and was incidentally noted to be in atrial fibrillation with RVR, new onset, and asymptomatic. 2D echocardiogram 03/2020 revealed normal left ventricular function with moderate LVH with no significant valvular insufficiencies. Holter monitor showed predominant sinus rhythm with an episode of atrial fibrillation lasting 1 hour and 10 minutes.  Stroke prevention with Eliquis  2.5 mg BID with CHA2DS2-VASc Score 6. Reduced dose with age > 80 and wt < 60 kg. Rate control: metoprolol  tartrate 25 mg BID Hypertension Hyperlipidemia Carotid stenosis, s/p R carotid endarterectomy, 2016 Last saw Vascular Surgery 11/14/22. Duplex ultrasound shows 1 to 39% stenosis bilaterally however there is some shadowing noted in the left ICA that was not noted previously. Due to the shadowing some of the stenosis in the left ICA was not able to be as well-visualized. COPD, tobacco abuse Type II diabetes  History of Present Illness Jane Davidson is an 84 year old female who presents for cardiovascular follow-up.  She denies chest pain, shortness of breath, orthopnea, or palpitations. Ankle swelling occurs only with tight socks and resolves after removal. She notes restless legs at night and morning leg heaviness that improves during the day.  She has carotid artery disease with last vascular surgery follow-up in February 2024 and denies blurry vision, headaches, dizziness, or stroke-like symptoms. Her blood pressure is managed with metoprolol  and losartan  by her primary  care provider.  She takes amlodipine  10 mg daily, losartan  100 mg qd, metoprolol  tartrate 25 mg BID, atorvastatin  20 mg daily, and Eliquis  2.5 mg twice daily, reduced for age and weight. She notes bruising but no significant bleeding.  She is actively cutting down on smoking without medications or nicotine  replacement.  She reports a recent fall after standing when her leg had fallen asleep. She did not strike her head and denies syncope or preceding dizziness.  2D ECHO 05/02/2024 revealed no LVH, normal biventricular systolic function, EF > 55%, moderate PR, mild MR and TR. No valvular stenosis. Normal RVSP 31 mmHg.    Past Medical and Surgical History  Past Medical History Past Medical History:  Diagnosis Date   Constipation    Depression    Hyperlipidemia    Hypertension    Hypothyroid     Past Surgical History She has a past surgical history that includes peripherial bypass (Bilateral); Hernia repair; and Laparoscopic cholecystectomy.   Medications and Allergies  Current Medications  Current Outpatient Medications on File Prior to Visit  Medication Sig Dispense Refill   amLODIPine  (NORVASC ) 10 MG tablet Take 10 mg by mouth once daily     apixaban  (ELIQUIS ) 2.5 mg tablet Take 1 tablet (2.5 mg total) by mouth every 12 (twelve) hours 180 tablet 3   atorvastatin  (LIPITOR) 20 MG tablet Take 20 mg by mouth once daily     fluticasone  (FLONASE ) 50 mcg/actuation nasal spray Place 2 sprays into both nostrils once daily as needed       fluticasone -salmeterol (ADVAIR DISKUS) 100-50 mcg/dose diskus inhaler ONE PUFF EVERY 12 HOURS AS DIRECTED. RINSE MOUTH AFTER  USING     levothyroxine  (SYNTHROID ) 88 MCG tablet      loratadine  (CLARITIN ) 10 mg tablet Take 1 tablet by mouth once daily     losartan  (COZAAR ) 100 MG tablet Take 1 tablet by mouth once daily     metoprolol  tartrate (LOPRESSOR ) 25 MG tablet TAKE 1 TABLET(25 MG) BY MOUTH TWICE DAILY 180 tablet 3   sertraline   (ZOLOFT ) 50 MG tablet Take 50 mg by mouth once daily     promethazine-dextromethorphan (PROMETHAZINE-DM) 6.25-15 mg/5 mL syrup Take 5 mLs by mouth every 6 (six) hours as needed (Patient not taking: Reported on 11/01/2024) 120 mL 0   No current facility-administered medications on file prior to visit.    Allergies: Bee pollen, Grass pollen, Hydrochlorothiazide , and Other  Social and Family History  Social History  reports that she has been smoking cigarettes. She has a 37.5 pack-year smoking history. She has never used smokeless tobacco. She reports that she does not currently use alcohol.  Family History No family history on file.  Review of Systems   Review of Systems  Constitutional:        Denies weight gain  Eyes:  Negative for blurred vision.  Respiratory:  Negative for shortness of breath.   Cardiovascular:  Negative for chest pain, palpitations, orthopnea and leg swelling.  Musculoskeletal:  Positive for myalgias (leg heaviness as per HPI).  Neurological:  Negative for dizziness, loss of consciousness and headaches.       Denies presyncope   Endo/Heme/Allergies:  Does not bruise/bleed easily.      Physical Examination   Vitals:BP 132/76   Pulse 75   Ht 165.1 cm (5' 5)   Wt 58.5 kg (129 lb)   SpO2 96%   BMI 21.47 kg/m  Ht:165.1 cm (5' 5) Wt:58.5 kg (129 lb) ADJ:Anib surface area is 1.64 meters squared. Body mass index is 21.47 kg/m.  Physical Exam Vitals reviewed.  Constitutional:      General: She is not in acute distress.    Appearance: Normal appearance.  Neck:     Vascular: Carotid bruit (R carotid bruit) present.  Cardiovascular:     Rate and Rhythm: Normal rate and regular rhythm.     Heart sounds: Normal heart sounds. No murmur heard. Pulmonary:     Effort: Pulmonary effort is normal. No respiratory distress.     Breath sounds: Normal breath sounds.  Musculoskeletal:     Right lower leg: No edema.     Left lower leg: No edema.  Neurological:      Mental Status: She is alert.     Assessment   84 y.o. female with  Encounter Diagnoses  Name Primary?   Paroxysmal atrial fibrillation (CMS/HHS-HCC) Yes   Bilateral carotid artery stenosis    Cardiac murmur    Chronic obstructive pulmonary disease, unspecified COPD type (CMS/HHS-HCC)    Essential hypertension    History of peripheral vascular disease    Hyperlipidemia, unspecified hyperlipidemia type    Tobacco abuse counseling    Tobacco abuse disorder    Type 2 diabetes mellitus with diabetic peripheral angiopathy without gangrene, without long-term current use of insulin (CMS/HHS-HCC)    History of CEA (carotid endarterectomy)    Bilateral leg pain    Right carotid bruit     Plan   Orders Placed This Encounter  Procedures   CARD Lower Extremity ABI/PVR Complete at Rest   CARD US  carotid bilateral   CT neck angiogram with and without contrast   Ambulatory  Referral to Vascular Surgery   Assessment & Plan Bilateral carotid artery stenosis, status post right carotid endarterectomy Carotid artery disease. Bruit on right side. Last ultrasound nearly two years ago. - Ordered repeat carotid ultrasound STAT. - Advised to call 911 for CVA symptoms like blurry vision, headaches, dizziness, slurred speech, or unilateral weakness. Asymptomatic today.   Peripheral artery disease Intermittent leg heaviness, mostly in mornings. Improves during the day. Known history of PAD with carotid artery disease. Current tobacco use and T2DM. Last vascular follow-up February 2024. - Ordered ABI testing. - Advised follow-up with vascular surgery. - Instructed to contact vascular surgery if heaviness persists or worsens with walking.  Type 2 diabetes mellitus with diabetic peripheral angiopathy Diabetes well-controlled, A1c 6.1%.  - Continue current diabetes management plan with PCP.  Essential hypertension Blood pressure well-controlled with current regimen. - Continue  current antihypertensive medications.  Hyperlipidemia Cholesterol well-controlled, LDL 48. No statin-induced myopathy. - Continue atorvastatin  20 mg daily.  Tobacco use disorder Actively trying to quit smoking, no cessation aids used. - Encouraged continued efforts to quit. - Offered support and discussed cessation aids like patches, gum, or Chantix. Pt declines.   Recording duration: 16 minutes  Return in about 6 months (around 05/01/2025).  ADDENDUM 11/02/2024: Carotid US  prelim results from 11/02/24 reveal 50-69% ICA stenosis bilaterally. Right ECA occluded. Heterogeneous plaque throughout right CCA and ICA. Heterogeneous plaque throughout left CCA. Calcified plaque in left bifurcation extending into ICA. Homogenous plaque in mid and distal ICA. Discussed results with patient. Discussed s/s of Cva that warrant calling EMS. Pt currently asymptomatic. Refer for CTA Neck. Refer to Vascular Surgery. MARY LAUREN CHEEK PARRISH, NP   This note has been created using automated tools and reviewed for accuracy by RONAL MAXWELL CHEEK PARRISH.   Attestation Statement:   I personally performed the service, non-incident to. [facility])   MARY MAXWELL LAUNIE MAIDEN, NP

## 2024-11-04 ENCOUNTER — Other Ambulatory Visit: Payer: Self-pay | Admitting: Nurse Practitioner

## 2024-11-04 ENCOUNTER — Telehealth: Payer: Self-pay

## 2024-11-04 DIAGNOSIS — E785 Hyperlipidemia, unspecified: Secondary | ICD-10-CM

## 2024-11-04 DIAGNOSIS — R0989 Other specified symptoms and signs involving the circulatory and respiratory systems: Secondary | ICD-10-CM

## 2024-11-04 DIAGNOSIS — Z9889 Other specified postprocedural states: Secondary | ICD-10-CM

## 2024-11-04 DIAGNOSIS — Z720 Tobacco use: Secondary | ICD-10-CM

## 2024-11-04 DIAGNOSIS — I6523 Occlusion and stenosis of bilateral carotid arteries: Secondary | ICD-10-CM

## 2024-11-04 DIAGNOSIS — I480 Paroxysmal atrial fibrillation: Secondary | ICD-10-CM

## 2024-11-04 DIAGNOSIS — E1151 Type 2 diabetes mellitus with diabetic peripheral angiopathy without gangrene: Secondary | ICD-10-CM

## 2024-11-04 NOTE — Telephone Encounter (Signed)
 Copied from CRM #[id number]. Topic: General - Other >> Nov 04, 2024  3:52 PM Berneda FALCON wrote: Reason for CRM: Patient states that she got a MyChart message but cannot log in to find out what it is about. I do not see any MyChart messages here on my end but the patient would like to speak to Dr. Lula nurse about it if possible please.  Patient callback is [phone] (home)

## 2024-11-04 NOTE — Telephone Encounter (Signed)
 Spoke to pt discuss concerns she had

## 2024-11-09 ENCOUNTER — Ambulatory Visit

## 2024-11-18 ENCOUNTER — Ambulatory Visit

## 2024-12-12 ENCOUNTER — Encounter: Admitting: Internal Medicine
